# Patient Record
Sex: Female | Born: 1945
Health system: Southern US, Community
[De-identification: ages and names within clinical notes are randomized; demographics above are authoritative.]

## PROBLEM LIST (undated history)

## (undated) DIAGNOSIS — E785 Hyperlipidemia, unspecified: Secondary | ICD-10-CM

## (undated) DIAGNOSIS — Z800 Family history of malignant neoplasm of digestive organs: Secondary | ICD-10-CM

## (undated) DIAGNOSIS — N390 Urinary tract infection, site not specified: Secondary | ICD-10-CM

## (undated) DIAGNOSIS — M7521 Bicipital tendinitis, right shoulder: Secondary | ICD-10-CM

## (undated) DIAGNOSIS — K5790 Diverticulosis of intestine, part unspecified, without perforation or abscess without bleeding: Secondary | ICD-10-CM

## (undated) DIAGNOSIS — B019 Varicella without complication: Secondary | ICD-10-CM

## (undated) DIAGNOSIS — Z803 Family history of malignant neoplasm of breast: Secondary | ICD-10-CM

## (undated) DIAGNOSIS — F411 Generalized anxiety disorder: Secondary | ICD-10-CM

## (undated) DIAGNOSIS — A048 Other specified bacterial intestinal infections: Secondary | ICD-10-CM

## (undated) DIAGNOSIS — K635 Polyp of colon: Secondary | ICD-10-CM

## (undated) DIAGNOSIS — R933 Abnormal findings on diagnostic imaging of other parts of digestive tract: Secondary | ICD-10-CM

## (undated) DIAGNOSIS — T7840XA Allergy, unspecified, initial encounter: Secondary | ICD-10-CM

## (undated) DIAGNOSIS — K259 Gastric ulcer, unspecified as acute or chronic, without hemorrhage or perforation: Secondary | ICD-10-CM

## (undated) DIAGNOSIS — R198 Other specified symptoms and signs involving the digestive system and abdomen: Secondary | ICD-10-CM

## (undated) DIAGNOSIS — G5701 Lesion of sciatic nerve, right lower limb: Secondary | ICD-10-CM

## (undated) DIAGNOSIS — I351 Nonrheumatic aortic (valve) insufficiency: Secondary | ICD-10-CM

## (undated) DIAGNOSIS — I10 Essential (primary) hypertension: Secondary | ICD-10-CM

## (undated) DIAGNOSIS — Z8041 Family history of malignant neoplasm of ovary: Secondary | ICD-10-CM

## (undated) DIAGNOSIS — K219 Gastro-esophageal reflux disease without esophagitis: Secondary | ICD-10-CM

## (undated) HISTORY — DX: Polyp of colon: K63.5

## (undated) HISTORY — DX: Family history of malignant neoplasm of digestive organs: Z80.0

## (undated) HISTORY — DX: Diverticulosis of intestine, part unspecified, without perforation or abscess without bleeding: K57.90

## (undated) HISTORY — DX: Hyperlipidemia, unspecified: E78.5

## (undated) HISTORY — DX: Family history of malignant neoplasm of ovary: Z80.41

## (undated) HISTORY — PX: TUBAL LIGATION: SHX77

## (undated) HISTORY — DX: Allergy, unspecified, initial encounter: T78.40XA

## (undated) HISTORY — DX: Lesion of sciatic nerve, right lower limb: G57.01

## (undated) HISTORY — DX: Other specified symptoms and signs involving the digestive system and abdomen: R19.8

## (undated) HISTORY — DX: Gastro-esophageal reflux disease without esophagitis: K21.9

## (undated) HISTORY — DX: Family history of malignant neoplasm of breast: Z80.3

## (undated) HISTORY — DX: Gastric ulcer, unspecified as acute or chronic, without hemorrhage or perforation: K25.9

## (undated) HISTORY — DX: Generalized anxiety disorder: F41.1

## (undated) HISTORY — DX: Other specified bacterial intestinal infections: A04.8

## (undated) HISTORY — DX: Nonrheumatic aortic (valve) insufficiency: I35.1

## (undated) HISTORY — DX: Abnormal findings on diagnostic imaging of other parts of digestive tract: R93.3

## (undated) HISTORY — PX: LIPOMA EXCISION: SHX5283

## (undated) HISTORY — DX: Varicella without complication: B01.9

## (undated) HISTORY — DX: Essential (primary) hypertension: I10

## (undated) HISTORY — DX: Urinary tract infection, site not specified: N39.0

## (undated) HISTORY — DX: Bicipital tendinitis, right shoulder: M75.21

---

## 1997-07-23 ENCOUNTER — Other Ambulatory Visit: Admission: RE | Admit: 1997-07-23 | Discharge: 1997-07-23 | Payer: Self-pay | Admitting: Obstetrics and Gynecology

## 1997-08-20 ENCOUNTER — Ambulatory Visit (HOSPITAL_COMMUNITY): Admission: RE | Admit: 1997-08-20 | Discharge: 1997-08-20 | Payer: Self-pay | Admitting: Obstetrics and Gynecology

## 1998-08-01 ENCOUNTER — Other Ambulatory Visit: Admission: RE | Admit: 1998-08-01 | Discharge: 1998-08-01 | Payer: Self-pay | Admitting: Obstetrics and Gynecology

## 1998-08-29 ENCOUNTER — Encounter: Payer: Self-pay | Admitting: Obstetrics and Gynecology

## 1998-08-29 ENCOUNTER — Ambulatory Visit (HOSPITAL_COMMUNITY): Admission: RE | Admit: 1998-08-29 | Discharge: 1998-08-29 | Payer: Self-pay | Admitting: Obstetrics and Gynecology

## 1999-12-07 ENCOUNTER — Other Ambulatory Visit: Admission: RE | Admit: 1999-12-07 | Discharge: 1999-12-07 | Payer: Self-pay | Admitting: Obstetrics and Gynecology

## 1999-12-11 ENCOUNTER — Encounter: Payer: Self-pay | Admitting: Obstetrics and Gynecology

## 1999-12-11 ENCOUNTER — Ambulatory Visit (HOSPITAL_COMMUNITY): Admission: RE | Admit: 1999-12-11 | Discharge: 1999-12-11 | Payer: Self-pay | Admitting: Obstetrics and Gynecology

## 2000-12-08 ENCOUNTER — Other Ambulatory Visit: Admission: RE | Admit: 2000-12-08 | Discharge: 2000-12-08 | Payer: Self-pay | Admitting: Obstetrics and Gynecology

## 2000-12-29 ENCOUNTER — Encounter: Payer: Self-pay | Admitting: Obstetrics and Gynecology

## 2000-12-29 ENCOUNTER — Ambulatory Visit (HOSPITAL_COMMUNITY): Admission: RE | Admit: 2000-12-29 | Discharge: 2000-12-29 | Payer: Self-pay | Admitting: Obstetrics and Gynecology

## 2001-12-05 ENCOUNTER — Other Ambulatory Visit: Admission: RE | Admit: 2001-12-05 | Discharge: 2001-12-05 | Payer: Self-pay | Admitting: Obstetrics and Gynecology

## 2002-01-09 ENCOUNTER — Ambulatory Visit (HOSPITAL_COMMUNITY): Admission: RE | Admit: 2002-01-09 | Discharge: 2002-01-09 | Payer: Self-pay | Admitting: Obstetrics and Gynecology

## 2002-01-09 ENCOUNTER — Encounter: Payer: Self-pay | Admitting: Obstetrics and Gynecology

## 2002-08-03 ENCOUNTER — Encounter: Payer: Self-pay | Admitting: Internal Medicine

## 2002-08-06 ENCOUNTER — Encounter: Payer: Self-pay | Admitting: Internal Medicine

## 2002-09-05 ENCOUNTER — Encounter: Payer: Self-pay | Admitting: Internal Medicine

## 2002-12-24 ENCOUNTER — Other Ambulatory Visit: Admission: RE | Admit: 2002-12-24 | Discharge: 2002-12-24 | Payer: Self-pay | Admitting: Obstetrics and Gynecology

## 2003-01-15 ENCOUNTER — Encounter: Payer: Self-pay | Admitting: Obstetrics and Gynecology

## 2003-01-15 ENCOUNTER — Ambulatory Visit (HOSPITAL_COMMUNITY): Admission: RE | Admit: 2003-01-15 | Discharge: 2003-01-15 | Payer: Self-pay | Admitting: Obstetrics and Gynecology

## 2003-12-31 ENCOUNTER — Other Ambulatory Visit: Admission: RE | Admit: 2003-12-31 | Discharge: 2003-12-31 | Payer: Self-pay | Admitting: *Deleted

## 2004-01-21 ENCOUNTER — Ambulatory Visit (HOSPITAL_COMMUNITY): Admission: RE | Admit: 2004-01-21 | Discharge: 2004-01-21 | Payer: Self-pay | Admitting: *Deleted

## 2004-03-19 ENCOUNTER — Ambulatory Visit: Payer: Self-pay | Admitting: Internal Medicine

## 2004-07-28 ENCOUNTER — Ambulatory Visit: Payer: Self-pay | Admitting: Internal Medicine

## 2004-07-31 ENCOUNTER — Ambulatory Visit: Payer: Self-pay | Admitting: Internal Medicine

## 2004-09-10 ENCOUNTER — Ambulatory Visit: Payer: Self-pay | Admitting: Internal Medicine

## 2004-09-15 ENCOUNTER — Ambulatory Visit: Payer: Self-pay | Admitting: Internal Medicine

## 2005-01-27 ENCOUNTER — Other Ambulatory Visit: Admission: RE | Admit: 2005-01-27 | Discharge: 2005-01-27 | Payer: Self-pay | Admitting: Obstetrics and Gynecology

## 2005-02-24 ENCOUNTER — Ambulatory Visit (HOSPITAL_COMMUNITY): Admission: RE | Admit: 2005-02-24 | Discharge: 2005-02-24 | Payer: Self-pay | Admitting: Obstetrics and Gynecology

## 2005-03-17 ENCOUNTER — Ambulatory Visit: Payer: Self-pay | Admitting: Internal Medicine

## 2005-08-03 ENCOUNTER — Ambulatory Visit: Payer: Self-pay | Admitting: Internal Medicine

## 2006-02-08 ENCOUNTER — Ambulatory Visit: Payer: Self-pay | Admitting: Psychology

## 2006-02-28 ENCOUNTER — Ambulatory Visit (HOSPITAL_COMMUNITY): Admission: RE | Admit: 2006-02-28 | Discharge: 2006-02-28 | Payer: Self-pay | Admitting: Obstetrics and Gynecology

## 2006-03-01 ENCOUNTER — Ambulatory Visit: Payer: Self-pay | Admitting: Psychology

## 2006-03-29 DIAGNOSIS — K259 Gastric ulcer, unspecified as acute or chronic, without hemorrhage or perforation: Secondary | ICD-10-CM

## 2006-03-29 HISTORY — DX: Gastric ulcer, unspecified as acute or chronic, without hemorrhage or perforation: K25.9

## 2006-03-30 ENCOUNTER — Ambulatory Visit: Payer: Self-pay | Admitting: Psychology

## 2006-04-05 ENCOUNTER — Ambulatory Visit: Payer: Self-pay | Admitting: Psychology

## 2006-04-20 ENCOUNTER — Ambulatory Visit: Payer: Self-pay | Admitting: Psychology

## 2006-05-11 ENCOUNTER — Ambulatory Visit: Payer: Self-pay | Admitting: Psychology

## 2006-05-12 ENCOUNTER — Ambulatory Visit: Payer: Self-pay | Admitting: Internal Medicine

## 2006-05-12 ENCOUNTER — Encounter: Admission: RE | Admit: 2006-05-12 | Discharge: 2006-05-12 | Payer: Self-pay | Admitting: Internal Medicine

## 2006-05-12 LAB — CONVERTED CEMR LAB
ALT: 27 units/L (ref 0–40)
AST: 32 units/L (ref 0–37)
Albumin: 4.4 g/dL (ref 3.5–5.2)
Alkaline Phosphatase: 67 units/L (ref 39–117)
Basophils Absolute: 0 10*3/uL (ref 0.0–0.1)
Basophils Relative: 0.6 % (ref 0.0–1.0)
Bilirubin, Direct: 0.1 mg/dL (ref 0.0–0.3)
Eosinophils Absolute: 0.1 10*3/uL (ref 0.0–0.6)
Eosinophils Relative: 1.8 % (ref 0.0–5.0)
H Pylori IgG: POSITIVE — AB
HCT: 45.3 % (ref 36.0–46.0)
Hemoglobin: 15.6 g/dL — ABNORMAL HIGH (ref 12.0–15.0)
Lymphocytes Relative: 31.8 % (ref 12.0–46.0)
MCHC: 34.4 g/dL (ref 30.0–36.0)
MCV: 86.5 fL (ref 78.0–100.0)
Monocytes Absolute: 0.5 10*3/uL (ref 0.2–0.7)
Monocytes Relative: 9.6 % (ref 3.0–11.0)
Neutro Abs: 3.2 10*3/uL (ref 1.4–7.7)
Neutrophils Relative %: 56.2 % (ref 43.0–77.0)
Platelets: 223 10*3/uL (ref 150–400)
RBC: 5.23 M/uL — ABNORMAL HIGH (ref 3.87–5.11)
RDW: 12.5 % (ref 11.5–14.6)
Total Bilirubin: 0.8 mg/dL (ref 0.3–1.2)
Total Protein: 6.8 g/dL (ref 6.0–8.3)
WBC: 5.6 10*3/uL (ref 4.5–10.5)

## 2006-08-30 ENCOUNTER — Ambulatory Visit: Payer: Self-pay | Admitting: Internal Medicine

## 2006-09-07 ENCOUNTER — Ambulatory Visit: Payer: Self-pay | Admitting: Internal Medicine

## 2006-09-27 ENCOUNTER — Encounter: Payer: Self-pay | Admitting: Internal Medicine

## 2006-09-27 ENCOUNTER — Ambulatory Visit: Payer: Self-pay | Admitting: Internal Medicine

## 2006-11-09 DIAGNOSIS — K219 Gastro-esophageal reflux disease without esophagitis: Secondary | ICD-10-CM | POA: Insufficient documentation

## 2006-11-09 DIAGNOSIS — E785 Hyperlipidemia, unspecified: Secondary | ICD-10-CM | POA: Insufficient documentation

## 2006-11-09 HISTORY — DX: Hyperlipidemia, unspecified: E78.5

## 2006-11-09 HISTORY — DX: Gastro-esophageal reflux disease without esophagitis: K21.9

## 2006-11-16 ENCOUNTER — Telehealth: Payer: Self-pay | Admitting: Internal Medicine

## 2006-11-22 ENCOUNTER — Telehealth: Payer: Self-pay | Admitting: Internal Medicine

## 2006-11-23 ENCOUNTER — Telehealth (INDEPENDENT_AMBULATORY_CARE_PROVIDER_SITE_OTHER): Payer: Self-pay | Admitting: *Deleted

## 2007-02-02 ENCOUNTER — Telehealth: Payer: Self-pay | Admitting: Internal Medicine

## 2007-02-06 ENCOUNTER — Telehealth: Payer: Self-pay | Admitting: Internal Medicine

## 2007-03-02 ENCOUNTER — Ambulatory Visit (HOSPITAL_COMMUNITY): Admission: RE | Admit: 2007-03-02 | Discharge: 2007-03-02 | Payer: Self-pay | Admitting: Obstetrics and Gynecology

## 2007-04-17 ENCOUNTER — Ambulatory Visit: Payer: Self-pay | Admitting: Psychology

## 2007-04-28 ENCOUNTER — Ambulatory Visit: Payer: Self-pay | Admitting: Internal Medicine

## 2007-04-28 LAB — CONVERTED CEMR LAB
AST: 31 units/L (ref 0–37)
BUN: 12 mg/dL (ref 6–23)
Basophils Absolute: 0 10*3/uL (ref 0.0–0.1)
Basophils Relative: 0.5 % (ref 0.0–1.0)
Bilirubin, Direct: 0.2 mg/dL (ref 0.0–0.3)
Calcium: 9.8 mg/dL (ref 8.4–10.5)
Creatinine, Ser: 0.7 mg/dL (ref 0.4–1.2)
Direct LDL: 126.7 mg/dL
GFR calc Af Amer: 110 mL/min
GFR calc non Af Amer: 91 mL/min
Glucose, Urine, Semiquant: 100
Hemoglobin: 16 g/dL — ABNORMAL HIGH (ref 12.0–15.0)
MCHC: 32.4 g/dL (ref 30.0–36.0)
Monocytes Absolute: 0.5 10*3/uL (ref 0.2–0.7)
Monocytes Relative: 7.8 % (ref 3.0–11.0)
Neutro Abs: 3.5 10*3/uL (ref 1.4–7.7)
Platelets: 252 10*3/uL (ref 150–400)
Potassium: 4.2 meq/L (ref 3.5–5.1)
Protein, U semiquant: NEGATIVE
RDW: 12.1 % (ref 11.5–14.6)
Specific Gravity, Urine: 1.02
Total CHOL/HDL Ratio: 2.8
Total Protein: 7 g/dL (ref 6.0–8.3)
Triglycerides: 83 mg/dL (ref 0–149)
VLDL: 17 mg/dL (ref 0–40)

## 2007-05-05 ENCOUNTER — Ambulatory Visit: Payer: Self-pay | Admitting: Psychology

## 2007-05-25 ENCOUNTER — Ambulatory Visit: Payer: Self-pay | Admitting: Internal Medicine

## 2007-06-09 ENCOUNTER — Ambulatory Visit: Payer: Self-pay | Admitting: Internal Medicine

## 2007-06-26 ENCOUNTER — Ambulatory Visit: Payer: Self-pay | Admitting: Psychology

## 2007-07-06 ENCOUNTER — Ambulatory Visit: Payer: Self-pay | Admitting: Internal Medicine

## 2007-07-06 DIAGNOSIS — F411 Generalized anxiety disorder: Secondary | ICD-10-CM

## 2007-07-06 HISTORY — DX: Generalized anxiety disorder: F41.1

## 2007-07-18 ENCOUNTER — Ambulatory Visit: Payer: Self-pay | Admitting: Psychology

## 2007-08-14 ENCOUNTER — Ambulatory Visit: Payer: Self-pay | Admitting: Internal Medicine

## 2007-08-28 ENCOUNTER — Ambulatory Visit: Payer: Self-pay | Admitting: Internal Medicine

## 2007-08-28 LAB — HM COLONOSCOPY: HM Colonoscopy: NORMAL

## 2007-10-04 ENCOUNTER — Encounter: Payer: Self-pay | Admitting: Internal Medicine

## 2007-10-24 ENCOUNTER — Ambulatory Visit: Payer: Self-pay

## 2007-10-24 ENCOUNTER — Encounter: Payer: Self-pay | Admitting: Internal Medicine

## 2008-01-04 ENCOUNTER — Ambulatory Visit: Payer: Self-pay | Admitting: Internal Medicine

## 2008-01-23 ENCOUNTER — Encounter: Payer: Self-pay | Admitting: Internal Medicine

## 2008-03-04 ENCOUNTER — Ambulatory Visit (HOSPITAL_COMMUNITY): Admission: RE | Admit: 2008-03-04 | Discharge: 2008-03-04 | Payer: Self-pay | Admitting: Obstetrics and Gynecology

## 2008-08-27 LAB — CONVERTED CEMR LAB: Pap Smear: NORMAL

## 2008-10-24 ENCOUNTER — Encounter: Payer: Self-pay | Admitting: Internal Medicine

## 2008-11-12 ENCOUNTER — Encounter: Payer: Self-pay | Admitting: Internal Medicine

## 2008-11-12 ENCOUNTER — Encounter (INDEPENDENT_AMBULATORY_CARE_PROVIDER_SITE_OTHER): Payer: Self-pay | Admitting: General Surgery

## 2008-11-12 ENCOUNTER — Ambulatory Visit (HOSPITAL_BASED_OUTPATIENT_CLINIC_OR_DEPARTMENT_OTHER): Admission: RE | Admit: 2008-11-12 | Discharge: 2008-11-12 | Payer: Self-pay | Admitting: General Surgery

## 2008-12-05 ENCOUNTER — Encounter: Payer: Self-pay | Admitting: Internal Medicine

## 2008-12-10 ENCOUNTER — Telehealth (INDEPENDENT_AMBULATORY_CARE_PROVIDER_SITE_OTHER): Payer: Self-pay | Admitting: *Deleted

## 2008-12-10 ENCOUNTER — Ambulatory Visit: Payer: Self-pay | Admitting: Internal Medicine

## 2008-12-10 DIAGNOSIS — S61209A Unspecified open wound of unspecified finger without damage to nail, initial encounter: Secondary | ICD-10-CM | POA: Insufficient documentation

## 2008-12-12 ENCOUNTER — Ambulatory Visit: Payer: Self-pay | Admitting: Internal Medicine

## 2008-12-24 ENCOUNTER — Ambulatory Visit: Payer: Self-pay | Admitting: Internal Medicine

## 2008-12-24 LAB — CONVERTED CEMR LAB
Alkaline Phosphatase: 68 units/L (ref 39–117)
BUN: 12 mg/dL (ref 6–23)
Basophils Absolute: 0 10*3/uL (ref 0.0–0.1)
Basophils Relative: 0.6 % (ref 0.0–3.0)
Bilirubin, Direct: 0.1 mg/dL (ref 0.0–0.3)
CO2: 32 meq/L (ref 19–32)
Chloride: 108 meq/L (ref 96–112)
Cholesterol: 199 mg/dL (ref 0–200)
Creatinine, Ser: 0.6 mg/dL (ref 0.4–1.2)
Eosinophils Absolute: 0.2 10*3/uL (ref 0.0–0.7)
Glucose, Bld: 76 mg/dL (ref 70–99)
Hemoglobin: 15.4 g/dL — ABNORMAL HIGH (ref 12.0–15.0)
LDL Cholesterol: 118 mg/dL — ABNORMAL HIGH (ref 0–99)
Leukocytes, UA: NEGATIVE
Lymphocytes Relative: 34.7 % (ref 12.0–46.0)
MCHC: 33.9 g/dL (ref 30.0–36.0)
Neutrophils Relative %: 53.7 % (ref 43.0–77.0)
Nitrite: NEGATIVE
Platelets: 209 10*3/uL (ref 150.0–400.0)
Potassium: 4.3 meq/L (ref 3.5–5.1)
RBC: 5.17 M/uL — ABNORMAL HIGH (ref 3.87–5.11)
Specific Gravity, Urine: <=1.005 (ref 1.000–1.030)
Total Bilirubin: 0.9 mg/dL (ref 0.3–1.2)
Total CHOL/HDL Ratio: 3
Total Protein: 7.1 g/dL (ref 6.0–8.3)
Urobilinogen, UA: 0.2 (ref 0.0–1.0)
VLDL: 16.6 mg/dL (ref 0.0–40.0)
pH: 6.5 (ref 5.0–8.0)

## 2008-12-27 LAB — HM MAMMOGRAPHY: HM Mammogram: NORMAL

## 2008-12-31 ENCOUNTER — Ambulatory Visit: Payer: Self-pay | Admitting: Internal Medicine

## 2009-01-07 ENCOUNTER — Encounter: Payer: Self-pay | Admitting: Internal Medicine

## 2009-01-07 ENCOUNTER — Ambulatory Visit: Payer: Self-pay | Admitting: Internal Medicine

## 2009-03-11 ENCOUNTER — Ambulatory Visit (HOSPITAL_COMMUNITY): Admission: RE | Admit: 2009-03-11 | Discharge: 2009-03-11 | Payer: Self-pay | Admitting: Obstetrics and Gynecology

## 2010-01-14 ENCOUNTER — Ambulatory Visit: Payer: Self-pay | Admitting: Internal Medicine

## 2010-03-16 ENCOUNTER — Ambulatory Visit (HOSPITAL_COMMUNITY)
Admission: RE | Admit: 2010-03-16 | Discharge: 2010-03-16 | Payer: Self-pay | Source: Home / Self Care | Attending: Obstetrics and Gynecology | Admitting: Obstetrics and Gynecology

## 2010-04-28 NOTE — Assessment & Plan Note (Signed)
Summary: FLU SHOT // RS   Nurse Visit   Allergies: 1)  Tetracycline Hcl (Tetracycline Hcl)  Orders Added: 1)  Admin 1st Vaccine [90471] 2)  Flu Vaccine 7yrs + [16109] Flu Vaccine Consent Questions     Do you have a history of severe allergic reactions to this vaccine? no    Any prior history of allergic reactions to egg and/or gelatin? no    Do you have a sensitivity to the preservative Thimersol? no    Do you have a past history of Guillan-Barre Syndrome? no    Do you currently have an acute febrile illness? no    Have you ever had a severe reaction to latex? no    Vaccine information given and explained to patient? yes    Are you currently pregnant? no    Lot Number:[id number]   Exp Date:09/26/2010   Site Given  Left Deltoid IM

## 2010-07-04 LAB — POCT HEMOGLOBIN-HEMACUE: Hemoglobin: 16.9 g/dL — ABNORMAL HIGH (ref 12.0–15.0)

## 2010-07-28 ENCOUNTER — Ambulatory Visit (INDEPENDENT_AMBULATORY_CARE_PROVIDER_SITE_OTHER): Payer: BC Managed Care – PPO | Admitting: Family Medicine

## 2010-07-28 ENCOUNTER — Encounter: Payer: Self-pay | Admitting: Family Medicine

## 2010-07-28 VITALS — BP 120/78 | Temp 98.4°F | Wt 124.0 lb

## 2010-07-28 DIAGNOSIS — M546 Pain in thoracic spine: Secondary | ICD-10-CM

## 2010-07-28 NOTE — Patient Instructions (Signed)
Follow up if you note any progressive pain or new symptoms.

## 2010-07-28 NOTE — Progress Notes (Signed)
  Subjective:    Patient ID: Yvette Short, female    DOB: 1945-07-26, 65 y.o.   MRN: [id number]  HPI Patient fell approximately 2 weeks ago. Tripped on asphalt. Landed on left side. Initially had some left lateral hip pain which has resolved and some bruising in that region. Mild left wrist pain also resolving. Has some pain left of the midthoracic spine about 4/10 severity but overall this is improving. No cough and no dyspnea. No pleuritic pain. Pain slightly worse lying. Minimal left anterior chest wall pain was also resolving. No head injury. Good relief with Tylenol and Advil.  Ambulating without difficulty.  Past Medical History  Diagnosis Date  . HYPERLIPIDEMIA 11/09/2006  . ANXIETY 07/06/2007  . GERD 11/09/2006   History reviewed. No pertinent past surgical history.  reports that she quit smoking about 36 years ago. Her smoking use included Cigarettes. She has a 325 pack-year smoking history. She does not have any smokeless tobacco history on file. Her alcohol and drug histories not on file. family history includes Cancer (age of onset:58) in her father and Dementia in her mother. Allergies  Allergen Reactions  . Tetracycline Hcl     REACTION: rash      Review of Systems  Respiratory: Negative for chest tightness and shortness of breath.   Cardiovascular: Negative for chest pain.  Gastrointestinal: Negative for abdominal pain and abdominal distention.  Musculoskeletal: Positive for back pain. Negative for myalgias and joint swelling.  Neurological: Negative for dizziness, syncope and headaches.       Objective:   Physical Exam  Constitutional: She appears well-developed and well-nourished. No distress.  HENT:  Head: Normocephalic and atraumatic.  Right Ear: External ear normal.  Left Ear: External ear normal.  Neck: Neck supple.  Cardiovascular: Normal rate, regular rhythm and normal heart sounds.  Exam reveals no gallop.   No murmur heard. Pulmonary/Chest: Effort  normal and breath sounds normal. No respiratory distress. She has no wheezes. She has no rales. She exhibits no tenderness.  Musculoskeletal:       Patient has no thoracic spine tenderness. Minimal muscular tenderness left of mid thoracic spine. No bruising. Left wrist exam is normal. Ambulating without difficulty.          Assessment & Plan:  Mid back pain following fall. Suspect soft tissue muscular pain. Overall this is improving. No spinal tenderness. Continue anti-inflammatory as needed and follow for now.

## 2010-08-04 ENCOUNTER — Telehealth: Payer: Self-pay | Admitting: *Deleted

## 2010-08-04 MED ORDER — LISINOPRIL 10 MG PO TABS: 10.0000 mg | ORAL_TABLET | Freq: Every day | ORAL | Status: DC

## 2010-08-04 NOTE — Telephone Encounter (Signed)
LMTCB

## 2010-08-04 NOTE — Telephone Encounter (Signed)
Review curent med list with patient and update epic If not on any bp meds start lisinopril 10 mg po qd

## 2010-08-04 NOTE — Telephone Encounter (Signed)
Pt was scheduled for an outpatient procedure today, and it was postponed due to her elevated BP.  First reading was 174/92, and was given .1mg  of Clonidine, and it eventually came down to 151/101 and later 182/86.  Was told to call Dr. Cato Mulligan for further instructions regarding her BP.

## 2010-08-04 NOTE — Telephone Encounter (Signed)
Pt is not on any BP meds and will start the Lisinopril.  Will check BP readings, and call them to [place] in a week or two.

## 2010-08-11 NOTE — Op Note (Signed)
Yvette, Short           ACCOUNT NO.:  [id number]   MEDICAL RECORD NO.:  [id number]          PATIENT TYPE:  AMB   LOCATION:  [facility]                          FACILITY:  [facility]   PHYSICIAN:  Sharlet Salina T. Hoxworth, M.D.DATE OF BIRTH:  Nov 14, 1945   DATE OF PROCEDURE:  11/12/2008  DATE OF DISCHARGE:                               OPERATIVE REPORT   PREOPERATIVE DIAGNOSIS:  Lipoma of back.   POSTOPERATIVE DIAGNOSIS:  Lipoma of back.   SURGICAL PROCEDURES:  Excision of lipoma of back.   SURGEON:  Lorne Skeens. Hoxworth, MD   ANESTHESIA:  Local with IV sedation.   BRIEF HISTORY:  [name] is a 65 year old female with a gradually  enlarging mass in her mid back.  Examination reveals an approximately 7  x 5 cm soft, freely movable subcutaneous mass consistent with lipoma.  Due to enlargement and symptoms, I have recommended excision.  Nature of  procedure, type of anesthetic, indications, risks of bleeding,  infection, and seroma were discussed and understood.  She is now brought  to operating room for this procedure.   DESCRIPTION OF OPERATION:  The patient was brought to operating room,  carefully positioned in the right lateral decubitus position and the  back widely sterilely prepped and draped.  She received preoperative  antibiotics.  Correct patient and procedure were verified.  A transverse  incision was made directly over the mass somewhat smaller than its size.  Dissection was carried down through the subcutaneous tissue.  The mass  was encapsulated and easily dissected away using mostly blunt dissection  from subcu and off the fascia.  Some sharp dissection was used as well  with local anesthesia and mass was completely excised and isolated down  to small vascular pedicle which was clamped, divided, and tied with 3-0  Vicryl.  Complete hemostasis was assured.  The subcu was closed with  interrupted 4-0 Monocryl and the skin with subcuticular 4-0 Monocryl and  Dermabond.  Sponges, needle, and instrument counts were correct.  The  patient was taken to the recovery in good condition.      Lorne Skeens. Hoxworth, M.D.  Electronically Signed     BTH/MEDQ  D:  11/12/2008  T:  11/13/2008  Job:  [id number]

## 2010-10-02 ENCOUNTER — Other Ambulatory Visit: Payer: Self-pay | Admitting: Internal Medicine

## 2010-11-05 ENCOUNTER — Other Ambulatory Visit: Payer: Self-pay | Admitting: Internal Medicine

## 2010-11-07 ENCOUNTER — Other Ambulatory Visit: Payer: Self-pay | Admitting: Internal Medicine

## 2010-11-11 ENCOUNTER — Telehealth: Payer: Self-pay | Admitting: *Deleted

## 2010-11-11 NOTE — Telephone Encounter (Signed)
Pt calls stating her BP (diastolic) usually runs in the 16X and 60's, but at two other MD appts, it was 82 and 88.  Having an elective procedure in 2 weeks and is wondering if Dr. Cato Mulligan thinks she should change any meds.

## 2010-11-11 NOTE — Telephone Encounter (Signed)
Ok to stay on same meds

## 2010-11-11 NOTE — Telephone Encounter (Signed)
Notified pt. 

## 2010-11-17 ENCOUNTER — Telehealth: Payer: Self-pay | Admitting: *Deleted

## 2010-11-17 MED ORDER — CLONIDINE HCL 0.1 MG PO TABS: ORAL_TABLET | ORAL | Status: DC

## 2010-11-17 NOTE — Telephone Encounter (Signed)
Pt walked in [facility]. [facility] was 188/90/in rt arm and 182/88/ was given clonidine and dropped to 185/95 in rt arm a nd 168/89 in left arm- when taken here at office it was 150 /80-- takes lisinopril- wants to have procedure done ,but every time she goes for appointment , bp is elevated/please advise what to do

## 2010-11-17 NOTE — Telephone Encounter (Signed)
Monitor bp at home and make sure normal bp is less than 140/90. Prior to next procedure have her take clonidine 0.1 mg by mouth 1 hour before procedure

## 2010-11-17 NOTE — Telephone Encounter (Signed)
Pt informed and was rather reluctant to do this. States she feels like she is trying to change her bp.  Will call 3# of clonidine in case she drops 1 or 2.

## 2010-12-07 ENCOUNTER — Other Ambulatory Visit: Payer: Self-pay | Admitting: Internal Medicine

## 2010-12-09 ENCOUNTER — Other Ambulatory Visit: Payer: Self-pay | Admitting: Internal Medicine

## 2010-12-09 NOTE — Telephone Encounter (Signed)
Pt has only one pill left

## 2010-12-10 ENCOUNTER — Other Ambulatory Visit: Payer: Self-pay | Admitting: *Deleted

## 2010-12-10 ENCOUNTER — Other Ambulatory Visit (INDEPENDENT_AMBULATORY_CARE_PROVIDER_SITE_OTHER): Payer: BC Managed Care – PPO

## 2010-12-10 DIAGNOSIS — Z0000 Encounter for general adult medical examination without abnormal findings: Secondary | ICD-10-CM

## 2010-12-10 LAB — CBC WITH DIFFERENTIAL/PLATELET
Basophils Relative: 0.5 % (ref 0.0–3.0)
Eosinophils Absolute: 0.4 10*3/uL (ref 0.0–0.7)
Eosinophils Relative: 7.8 % — ABNORMAL HIGH (ref 0.0–5.0)
HCT: 44.1 % (ref 36.0–46.0)
Hemoglobin: 14.6 g/dL (ref 12.0–15.0)
Lymphs Abs: 2.3 10*3/uL (ref 0.7–4.0)
MCHC: 33.1 g/dL (ref 30.0–36.0)
Monocytes Absolute: 0.4 10*3/uL (ref 0.1–1.0)
Neutrophils Relative %: 42.6 % — ABNORMAL LOW (ref 43.0–77.0)
Platelets: 222 10*3/uL (ref 150.0–400.0)
RBC: 4.91 Mil/uL (ref 3.87–5.11)
RDW: 13 % (ref 11.5–14.6)

## 2010-12-10 LAB — LIPID PANEL
Cholesterol: 225 mg/dL — ABNORMAL HIGH (ref 0–200)
Triglycerides: 105 mg/dL (ref 0.0–149.0)

## 2010-12-10 LAB — POCT URINALYSIS DIPSTICK
Bilirubin, UA: NEGATIVE
Blood, UA: NEGATIVE
Glucose, UA: NEGATIVE
Ketones, UA: NEGATIVE
Nitrite, UA: NEGATIVE
Spec Grav, UA: 1.02
Urobilinogen, UA: 0.2
pH, UA: 6

## 2010-12-10 LAB — BASIC METABOLIC PANEL
BUN: 16 mg/dL (ref 6–23)
Creatinine, Ser: 0.6 mg/dL (ref 0.4–1.2)
GFR: 101.08 mL/min (ref >60.00)
Potassium: 4.9 mEq/L (ref 3.5–5.1)
Sodium: 142 mEq/L (ref 135–145)

## 2010-12-10 LAB — HEPATIC FUNCTION PANEL
ALT: 34 U/L (ref 0–35)
AST: 33 U/L (ref 0–37)
Bilirubin, Direct: 0 mg/dL (ref 0.0–0.3)
Total Protein: 7.1 g/dL (ref 6.0–8.3)

## 2010-12-10 LAB — TSH: TSH: 2.26 u[IU]/mL (ref 0.35–5.50)

## 2010-12-10 MED ORDER — LISINOPRIL 10 MG PO TABS: 10.0000 mg | ORAL_TABLET | Freq: Every day | ORAL | Status: DC

## 2010-12-17 ENCOUNTER — Encounter: Payer: BC Managed Care – PPO | Admitting: Internal Medicine

## 2010-12-21 ENCOUNTER — Encounter: Payer: Self-pay | Admitting: Internal Medicine

## 2010-12-21 ENCOUNTER — Ambulatory Visit (INDEPENDENT_AMBULATORY_CARE_PROVIDER_SITE_OTHER): Payer: BC Managed Care – PPO | Admitting: Internal Medicine

## 2010-12-21 ENCOUNTER — Telehealth: Payer: Self-pay | Admitting: *Deleted

## 2010-12-21 DIAGNOSIS — G43909 Migraine, unspecified, not intractable, without status migrainosus: Secondary | ICD-10-CM

## 2010-12-21 DIAGNOSIS — I10 Essential (primary) hypertension: Secondary | ICD-10-CM

## 2010-12-21 DIAGNOSIS — Z23 Encounter for immunization: Secondary | ICD-10-CM

## 2010-12-21 DIAGNOSIS — K219 Gastro-esophageal reflux disease without esophagitis: Secondary | ICD-10-CM

## 2010-12-21 HISTORY — DX: Essential (primary) hypertension: I10

## 2010-12-21 MED ORDER — SUMATRIPTAN SUCCINATE 100 MG PO TABS: ORAL_TABLET | ORAL | Status: DC

## 2010-12-21 MED ORDER — OMEPRAZOLE 20 MG PO CPDR: 20.0000 mg | DELAYED_RELEASE_CAPSULE | Freq: Every day | ORAL | Status: DC

## 2010-12-21 MED ORDER — AMLODIPINE BESYLATE 2.5 MG PO TABS: 2.5000 mg | ORAL_TABLET | Freq: Every day | ORAL | Status: DC

## 2010-12-21 NOTE — Assessment & Plan Note (Signed)
She is not on PPI Reviewed EGD--note ? [facility]  Resume PPI

## 2010-12-21 NOTE — Progress Notes (Signed)
  Subjective:    Patient ID: Yvette Short, female    DOB: 07/31/1945, 65 y.o.   MRN: [id number]  HPI  cpx Has developed chronic cough (note lisinopril use)  Chronic abdominal pain---ongoing for years.   Long hx of migraines: approximately one/month  Past Medical History  Diagnosis Date  . HYPERLIPIDEMIA 11/09/2006  . ANXIETY 07/06/2007  . GERD 11/09/2006   No past surgical history on file.  reports that she quit smoking about 36 years ago. Her smoking use included Cigarettes. She has a 325 pack-year smoking history. She does not have any smokeless tobacco history on file. Her alcohol and drug histories not on file. family history includes Cancer (age of onset:58) in her father and Dementia in her mother. Allergies  Allergen Reactions  . Tetracycline Hcl     REACTION: rash     Review of Systems  patient denies chest pain, shortness of breath, orthopnea. Denies lower extremity edema, abdominal pain, change in appetite, change in bowel movements. Patient denies rashes, musculoskeletal complaints. No other specific complaints in a complete review of systems.      Objective:   Physical Exam  Well-developed well-nourished female in no acute distress. HEENT exam atraumatic, normocephalic, extraocular muscles are intact. Neck is supple. No jugular venous distention no thyromegaly. Chest clear to auscultation without increased work of breathing. Cardiac exam S1 and S2 are regular. Abdominal exam active bowel sounds, soft, nontender. Extremities no edema. Neurologic exam she is alert without any motor sensory deficits. Gait is normal.     Assessment & Plan:  Well visit, health maint UTD

## 2010-12-21 NOTE — Assessment & Plan Note (Signed)
Trial imitrex.

## 2010-12-21 NOTE — Telephone Encounter (Signed)
Pt states Dr. Cato Mulligan offered to give her Rx for anxiety for upcoming procedure?, and she would like to have that.  Wants to know if she should take the Clonidine with this???

## 2010-12-21 NOTE — Assessment & Plan Note (Addendum)
May have cough from lisinopril---stop meds Start norvasc---low dose Monitor bp at home, call for bp > 135/85

## 2010-12-22 MED ORDER — LORAZEPAM 0.5 MG PO TABS: 0.5000 mg | ORAL_TABLET | ORAL | Status: DC

## 2010-12-22 NOTE — Telephone Encounter (Signed)
Probably reasonable to take both Lorazepam 0.5 mg po 1 hour prior to procedure. She should contact her surgeon to make sure he/she knows about the medication and approves.   #4 tabs/0refills

## 2010-12-22 NOTE — Telephone Encounter (Signed)
Notified pt of Dr. Cato Mulligan recommendations.

## 2011-01-05 ENCOUNTER — Telehealth: Payer: Self-pay | Admitting: *Deleted

## 2011-01-05 DIAGNOSIS — I10 Essential (primary) hypertension: Secondary | ICD-10-CM

## 2011-01-05 MED ORDER — AMLODIPINE BESYLATE 5 MG PO TABS: 5.0000 mg | ORAL_TABLET | Freq: Every day | ORAL | Status: DC

## 2011-01-05 MED ORDER — AMLODIPINE BESYLATE 2.5 MG PO TABS: 5.0000 mg | ORAL_TABLET | Freq: Every day | ORAL | Status: DC

## 2011-01-05 NOTE — Telephone Encounter (Signed)
Increase amlodipine 5 mg po qd

## 2011-01-05 NOTE — Telephone Encounter (Signed)
BP was still too high to get her Blepharoplasty !78/82/ 172/94.  She took her Amlodipine 2.5 mg that morning and also Clonidine 0.1 mg.  Feels her BP is running too high all the time....Marland Kitchenaveraging 117/67---141/85 at home.

## 2011-01-05 NOTE — Telephone Encounter (Signed)
Notified pt. 

## 2011-02-11 ENCOUNTER — Other Ambulatory Visit (HOSPITAL_COMMUNITY): Payer: Self-pay | Admitting: Obstetrics and Gynecology

## 2011-02-11 DIAGNOSIS — Z1231 Encounter for screening mammogram for malignant neoplasm of breast: Secondary | ICD-10-CM

## 2011-02-22 ENCOUNTER — Ambulatory Visit (INDEPENDENT_AMBULATORY_CARE_PROVIDER_SITE_OTHER): Payer: BC Managed Care – PPO | Admitting: Internal Medicine

## 2011-02-22 ENCOUNTER — Other Ambulatory Visit: Payer: Self-pay | Admitting: Internal Medicine

## 2011-02-22 ENCOUNTER — Encounter: Payer: Self-pay | Admitting: Internal Medicine

## 2011-02-22 VITALS — BP 112/84 | HR 72 | Temp 97.8°F | Wt 126.0 lb

## 2011-02-22 DIAGNOSIS — R0789 Other chest pain: Secondary | ICD-10-CM

## 2011-02-22 DIAGNOSIS — I10 Essential (primary) hypertension: Secondary | ICD-10-CM

## 2011-02-22 DIAGNOSIS — R9389 Abnormal findings on diagnostic imaging of other specified body structures: Secondary | ICD-10-CM

## 2011-02-22 NOTE — Progress Notes (Signed)
Patient ID: Yvette Short, female   DOB: [birth year], 65 y.o.   MRN: [id number]  htn--home bps average 135ish/70s (low bp of 107/67).  She does describe a chronic chest tightness. She previously related to GERD type sxs which are MUCH better on omeprzole.  She does admit to intermittent SOB---walking up a flight of steps.   Past Medical History  Diagnosis Date  . HYPERLIPIDEMIA 11/09/2006  . ANXIETY 07/06/2007  . GERD 11/09/2006    History   Social History  . Marital Status: Married    Spouse Name: N/A    Number of Children: N/A  . Years of Education: N/A   Occupational History  . Not on file.   Social History Main Topics  . Smoking status: Former Smoker -- 2.5 packs/day for 130 years    Types: Cigarettes    Quit date: 07/28/1974  . Smokeless tobacco: Not on file  . Alcohol Use: Not on file  . Drug Use: Not on file  . Sexually Active: Not on file   Other Topics Concern  . Not on file   Social History Narrative  . No narrative on file    No past surgical history on file.  Family History  Problem Relation Age of Onset  . Dementia Mother   . Cancer Father 45    colon    Allergies  Allergen Reactions  . Tetracycline Hcl     REACTION: rash    Current Outpatient Prescriptions on File Prior to Visit  Medication Sig Dispense Refill  . amLODipine (NORVASC) 5 MG tablet Take 1 tablet (5 mg total) by mouth daily.  30 tablet  11  . Cholecalciferol (VITAMIN D) 2000 UNITS CAPS Take 1 capsule by mouth daily.        Marland Kitchen estradiol (VIVELLE-DOT) 0.025 MG/24HR Place 1 patch onto the skin 2 (two) times a week.        Marland Kitchen omeprazole (PRILOSEC) 20 MG capsule Take 1 capsule (20 mg total) by mouth daily.  30 capsule  1  . progesterone (PROMETRIUM) 100 MG capsule Take 100 mg by mouth daily.        . SUMAtriptan (IMITREX) 100 MG tablet Take one tablet at onset of headache/aura. May repeat in one hour for continued headache  9 tablet  2     patient denies chest pain, shortness of  breath, orthopnea. Denies lower extremity edema, abdominal pain, change in appetite, change in bowel movements. Patient denies rashes, musculoskeletal complaints. No other specific complaints in a complete review of systems.   BP 112/84  Pulse 72  Temp(Src) 97.8 F (36.6 C) (Oral)  Wt 126 lb (57.153 kg)  Well-developed well-nourished female in no acute distress. HEENT exam atraumatic, normocephalic, extraocular muscles are intact. Neck is supple. No jugular venous distention no thyromegaly. Chest clear to auscultation without increased work of breathing. Cardiac exam S1 and S2 are regular. Abdominal exam active bowel sounds, soft, nontender. Extremities no edema. Neurologic exam she is alert without any motor sensory deficits. Gait is normal.

## 2011-02-26 ENCOUNTER — Ambulatory Visit (INDEPENDENT_AMBULATORY_CARE_PROVIDER_SITE_OTHER)
Admission: RE | Admit: 2011-02-26 | Discharge: 2011-02-26 | Disposition: A | Discharge status: Home / Self Care | Payer: BC Managed Care – PPO | Source: Ambulatory Visit | Attending: Internal Medicine | Admitting: Internal Medicine

## 2011-02-26 ENCOUNTER — Encounter: Payer: Self-pay | Admitting: Internal Medicine

## 2011-02-26 DIAGNOSIS — R0789 Other chest pain: Secondary | ICD-10-CM

## 2011-02-28 NOTE — Assessment & Plan Note (Signed)
Blood pressure is well controlled. Continue current medications. 

## 2011-03-04 ENCOUNTER — Ambulatory Visit (INDEPENDENT_AMBULATORY_CARE_PROVIDER_SITE_OTHER)
Admission: RE | Admit: 2011-03-04 | Discharge: 2011-03-04 | Disposition: A | Discharge status: Home / Self Care | Payer: BC Managed Care – PPO | Source: Ambulatory Visit | Attending: Internal Medicine | Admitting: Internal Medicine

## 2011-03-04 DIAGNOSIS — R918 Other nonspecific abnormal finding of lung field: Secondary | ICD-10-CM

## 2011-03-04 DIAGNOSIS — R9389 Abnormal findings on diagnostic imaging of other specified body structures: Secondary | ICD-10-CM

## 2011-03-04 MED ORDER — IOHEXOL 300 MG/ML  SOLN
100.0000 mL | Freq: Once | INTRAMUSCULAR | Status: AC | PRN
  Administered 2011-03-04: 100 mL via INTRAVENOUS

## 2011-03-09 ENCOUNTER — Other Ambulatory Visit: Payer: Self-pay | Admitting: Internal Medicine

## 2011-03-16 ENCOUNTER — Ambulatory Visit (INDEPENDENT_AMBULATORY_CARE_PROVIDER_SITE_OTHER): Payer: BC Managed Care – PPO | Admitting: Internal Medicine

## 2011-03-16 ENCOUNTER — Encounter: Payer: Self-pay | Admitting: Internal Medicine

## 2011-03-16 VITALS — BP 124/80 | Temp 97.9°F | Wt 126.0 lb

## 2011-03-16 DIAGNOSIS — J479 Bronchiectasis, uncomplicated: Secondary | ICD-10-CM | POA: Insufficient documentation

## 2011-03-16 DIAGNOSIS — R911 Solitary pulmonary nodule: Secondary | ICD-10-CM

## 2011-03-16 DIAGNOSIS — J984 Other disorders of lung: Secondary | ICD-10-CM

## 2011-03-16 HISTORY — DX: Bronchiectasis, uncomplicated: J47.9

## 2011-03-16 NOTE — Assessment & Plan Note (Signed)
F/u ct in 6 months Scheduled today  Discussed with patient and husband

## 2011-03-18 ENCOUNTER — Ambulatory Visit (HOSPITAL_COMMUNITY)
Admission: RE | Admit: 2011-03-18 | Discharge: 2011-03-18 | Disposition: A | Discharge status: Home / Self Care | Payer: BC Managed Care – PPO | Source: Ambulatory Visit | Attending: Obstetrics and Gynecology | Admitting: Obstetrics and Gynecology

## 2011-03-18 DIAGNOSIS — Z1231 Encounter for screening mammogram for malignant neoplasm of breast: Secondary | ICD-10-CM | POA: Insufficient documentation

## 2011-03-23 NOTE — Progress Notes (Signed)
Patient ID: Yvette Short, female   DOB: Sep 10, 1945, 65 y.o.   MRN: [id number] Comes in to discuss CT I discussed CT findings at length with patient and husband Total time 15 minutes all counseling-

## 2011-04-06 ENCOUNTER — Encounter: Payer: Self-pay | Admitting: *Deleted

## 2011-04-13 ENCOUNTER — Encounter: Payer: Self-pay | Admitting: Internal Medicine

## 2011-04-13 ENCOUNTER — Ambulatory Visit (INDEPENDENT_AMBULATORY_CARE_PROVIDER_SITE_OTHER): Payer: BC Managed Care – PPO | Admitting: Internal Medicine

## 2011-04-13 VITALS — BP 112/72 | HR 82 | Ht 65.0 in | Wt 126.0 lb

## 2011-04-13 DIAGNOSIS — K219 Gastro-esophageal reflux disease without esophagitis: Secondary | ICD-10-CM

## 2011-04-13 DIAGNOSIS — R079 Chest pain, unspecified: Secondary | ICD-10-CM | POA: Insufficient documentation

## 2011-04-13 MED ORDER — OMEPRAZOLE 20 MG PO CPDR: 20.0000 mg | DELAYED_RELEASE_CAPSULE | Freq: Two times a day (BID) | ORAL | Status: DC

## 2011-04-13 NOTE — Progress Notes (Signed)
Yvette Short 13-May-1945 MRN [id number]     History of Present Illness:  This is a 66 year old white female with gastroesophageal reflux. She has had chest tightness which improved tremendously after starting Prilosec 20 mg daily. We have seen her in the past for gastroesophageal reflux. She had an upper endoscopy in July 2008. She has a history of the H. pylori antibody which was treated in 2007 with triple therapy. She does not drink alcohol or smoke. Her symptoms started after she took a course of ibuprofen for a rib contusion. The omeprazole has improved her reflux 80%. She denies dysphagia or odynophagia but has an occasional cough at night. She has a positive family history of colon cancer in her father and has been up-to-date on her colonoscopy. She had those completed in 1998, 2004 and June 2009. Her next exam will be due in June 2014.   Past Medical History  Diagnosis Date  . HYPERLIPIDEMIA 11/09/2006  . ANXIETY 07/06/2007  . GERD 11/09/2006  . Migraine   . Hypertension   . Diverticulosis    Past Surgical History  Procedure Date  . Lipoma excision     of back    reports that she quit smoking about 36 years ago. Her smoking use included Cigarettes. She has a 325 pack-year smoking history. She does not have any smokeless tobacco history on file. She reports that she does not use illicit drugs. Her alcohol history not on file. family history includes Colon cancer (age of onset:58) in her father and Dementia in her mother. Allergies  Allergen Reactions  . Tetracycline Hcl     REACTION: rash        Review of Systems: Denies dysphagia odynophagia. Positive full chest tightness  The remainder of the 10 point ROS is negative except as outlined in H&P   Physical Exam: General appearance  Well developed, in no distress. Eyes- non icteric. HEENT nontraumatic, normocephalic. Normal boys. No cough Mouth no lesions, tongue papillated, no cheilosis. Neck supple without  adenopathy, thyroid not enlarged, no carotid bruits, no JVD. Lungs Clear to auscultation bilaterally. Cor normal S1, normal S2, regular rhythm, no murmur,  quiet precordium. Abdomen: Soft nontender abdomen with normal active bowel sounds. No distention. Liver edge at costal margin. Rectal: Not done Extremities no pedal edema. Skin no lesions. Neurological alert and oriented x 3. Psychological normal mood and affect.  Assessment and Plan:  Problem # 1 Chronic gastroesophageal reflux initially refractory to antireflux measures. It has now markedly improved on Prilosec 20 mg a day. She will increase her Prilosec to 20 mg twice a day and then reduce it to one a day when her symptoms subside. She has a history of treated H. pylori. She would like to continue her medical treatment before considering another endoscopy.  Family history of colon cancer. She is up-to-date on her colonoscopy. Her next exam will be due in June 2014.   04/13/2011 Lina Sar

## 2011-04-13 NOTE — Patient Instructions (Signed)
We have sent the following medications to your pharmacy for you to pick up at your convenience: Omeprazole We have given you a brochure of information regarding GERD. CC: Dr Birdie Sons

## 2011-07-06 ENCOUNTER — Ambulatory Visit (INDEPENDENT_AMBULATORY_CARE_PROVIDER_SITE_OTHER): Payer: BC Managed Care – PPO | Admitting: Family

## 2011-07-06 ENCOUNTER — Encounter: Payer: Self-pay | Admitting: Family

## 2011-07-06 VITALS — BP 120/88 | Temp 98.8°F | Wt 126.0 lb

## 2011-07-06 DIAGNOSIS — T148 Other injury of unspecified body region: Secondary | ICD-10-CM

## 2011-07-06 DIAGNOSIS — B354 Tinea corporis: Secondary | ICD-10-CM

## 2011-07-06 DIAGNOSIS — W57XXXA Bitten or stung by nonvenomous insect and other nonvenomous arthropods, initial encounter: Secondary | ICD-10-CM

## 2011-07-06 DIAGNOSIS — T148XXA Other injury of unspecified body region, initial encounter: Secondary | ICD-10-CM

## 2011-07-06 MED ORDER — CLOTRIMAZOLE-BETAMETHASONE 1-0.05 % EX CREA: TOPICAL_CREAM | Freq: Two times a day (BID) | CUTANEOUS | Status: DC

## 2011-07-06 NOTE — Progress Notes (Signed)
Subjective:    Patient ID: Yvette Short, female    DOB: 1945/09/09, 66 y.o.   MRN: [id number]  HPI 66 year old white female, nonsmoker, patient of Dr. Cato Mulligan in today after a tick bite approximately 3 weeks ago. She denies any fever, muscle aches, pain, or bread rash. However, at the site of the tick bite she has a dry, itchy rash, that is flesh colored. She's been applying the steroid cream to the area with no relief.   Review of Systems  Constitutional: Negative.   Respiratory: Negative.   Cardiovascular: Negative.   Skin: Positive for rash.       Right forearm  Hematological: Negative.        Past Medical History  Diagnosis Date  . HYPERLIPIDEMIA 11/09/2006  . ANXIETY 07/06/2007  . GERD 11/09/2006  . Migraine   . Hypertension   . Diverticulosis     History   Social History  . Marital Status: Married    Spouse Name: N/A    Number of Children: N/A  . Years of Education: N/A   Occupational History  . Not on file.   Social History Main Topics  . Smoking status: Former Smoker -- 2.5 packs/day for 130 years    Types: Cigarettes    Quit date: 07/28/1974  . Smokeless tobacco: Not on file  . Alcohol Use: Not on file  . Drug Use: No  . Sexually Active: Not on file   Other Topics Concern  . Not on file   Social History Narrative  . No narrative on file    Past Surgical History  Procedure Date  . Lipoma excision     of back    Family History  Problem Relation Age of Onset  . Dementia Mother   . Colon cancer Father 19    Allergies  Allergen Reactions  . Tetracycline Hcl     REACTION: rash    Current Outpatient Prescriptions on File Prior to Visit  Medication Sig Dispense Refill  . amLODipine (NORVASC) 5 MG tablet Take 1 tablet (5 mg total) by mouth daily.  30 tablet  11  . calcium carbonate (OS-CAL) 600 MG TABS Take 600 mg by mouth daily.      . Cholecalciferol (VITAMIN D) 2000 UNITS CAPS Take 1 capsule by mouth daily.        Marland Kitchen estradiol  (VIVELLE-DOT) 0.025 MG/24HR Place 1 patch onto the skin 2 (two) times a week.        . magnesium 30 MG tablet Take 30 mg by mouth 2 (two) times daily.      . progesterone (PROMETRIUM) 100 MG capsule Take 100 mg by mouth daily.        . SUMAtriptan (IMITREX) 100 MG tablet Take one tablet at onset of headache/aura. May repeat in one hour for continued headache  9 tablet  2  . vitamin C (ASCORBIC ACID) 500 MG tablet Take 500 mg by mouth daily.      Marland Kitchen omeprazole (PRILOSEC) 20 MG capsule Take 1 capsule (20 mg total) by mouth 2 (two) times daily.  60 capsule  4    BP 120/88  Temp(Src) 98.8 F (37.1 C) (Oral)  Wt 126 lb (57.153 kg)chart Objective:   Physical Exam  Constitutional: She is oriented to person, place, and time. She appears well-developed and well-nourished.  Neck: Normal range of motion. Neck supple.  Cardiovascular: Normal rate, regular rhythm and normal heart sounds.   Pulmonary/Chest: Effort normal and breath sounds normal.  Neurological:  She is alert and oriented to person, place, and time.  Skin: Skin is warm and dry. Rash noted.        1 cm rash noted to the right antecubital area. Raised, flesh toned. No drainage or s/s of infection.  Psychiatric: She has a normal mood and affect.          Assessment & Plan:  Assessment: Tick bite, tinea or pruritus   Plan: Lotrisone cream apply to the affected area twice a day. Patient upon the office is symptoms worsen or persist, recheck as scheduled or when necessary.

## 2011-09-07 ENCOUNTER — Ambulatory Visit (INDEPENDENT_AMBULATORY_CARE_PROVIDER_SITE_OTHER): Payer: BC Managed Care – PPO | Admitting: Internal Medicine

## 2011-09-07 VITALS — BP 115/68 | HR 72 | Temp 98.3°F | Wt 125.0 lb

## 2011-09-07 DIAGNOSIS — I10 Essential (primary) hypertension: Secondary | ICD-10-CM

## 2011-09-07 DIAGNOSIS — R911 Solitary pulmonary nodule: Secondary | ICD-10-CM

## 2011-09-07 NOTE — Progress Notes (Signed)
Patient ID: Yvette Short, female   DOB: [birth year], 66 y.o.   MRN: [id number]  htn-- home bps- not done.   she feels well, no complaints  Past Medical History  Diagnosis Date  . HYPERLIPIDEMIA 11/09/2006  . ANXIETY 07/06/2007  . GERD 11/09/2006  . Migraine   . Hypertension   . Diverticulosis     History   Social History  . Marital Status: Married    Spouse Name: N/A    Number of Children: N/A  . Years of Education: N/A   Occupational History  . Not on file.   Social History Main Topics  . Smoking status: Former Smoker -- 2.5 packs/day for 130 years    Types: Cigarettes    Quit date: 07/28/1974  . Smokeless tobacco: Not on file  . Alcohol Use: Not on file  . Drug Use: No  . Sexually Active: Not on file   Other Topics Concern  . Not on file   Social History Narrative  . No narrative on file    Past Surgical History  Procedure Date  . Lipoma excision     of back    Family History  Problem Relation Age of Onset  . Dementia Mother   . Colon cancer Father 1    Allergies  Allergen Reactions  . Tetracycline Hcl     REACTION: rash    Current Outpatient Prescriptions on File Prior to Visit  Medication Sig Dispense Refill  . amLODipine (NORVASC) 5 MG tablet Take 1 tablet (5 mg total) by mouth daily.  30 tablet  11  . calcium carbonate (OS-CAL) 600 MG TABS Take 600 mg by mouth daily.      . Cholecalciferol (VITAMIN D) 2000 UNITS CAPS Take 1 capsule by mouth daily.        Marland Kitchen estradiol (VIVELLE-DOT) 0.025 MG/24HR Place 1 patch onto the skin 2 (two) times a week.        . magnesium 30 MG tablet Take 30 mg by mouth 2 (two) times daily.      . progesterone (PROMETRIUM) 100 MG capsule Take 100 mg by mouth daily.        . vitamin C (ASCORBIC ACID) 500 MG tablet Take 500 mg by mouth daily.         patient denies chest pain, shortness of breath, orthopnea. Denies lower extremity edema, abdominal pain, change in appetite, change in bowel movements. Patient denies  rashes, musculoskeletal complaints. No other specific complaints in a complete review of systems.   BP 115/68  Pulse 72  Temp 98.3 F (36.8 C) (Oral)  Wt 125 lb (56.7 kg)  Well-developed well-nourished female in no acute distress. HEENT exam atraumatic, normocephalic, extraocular muscles are intact. Neck is supple. No jugular venous distention no thyromegaly. Chest clear to auscultation without increased work of breathing. Cardiac exam S1 and S2 are regular.

## 2011-09-09 NOTE — Assessment & Plan Note (Signed)
Controlled Continue same meds 

## 2011-09-09 NOTE — Assessment & Plan Note (Signed)
Schedule CT

## 2011-09-13 ENCOUNTER — Other Ambulatory Visit: Payer: Self-pay | Admitting: Internal Medicine

## 2011-09-13 DIAGNOSIS — R911 Solitary pulmonary nodule: Secondary | ICD-10-CM

## 2011-09-16 ENCOUNTER — Ambulatory Visit (INDEPENDENT_AMBULATORY_CARE_PROVIDER_SITE_OTHER)
Admission: RE | Admit: 2011-09-16 | Discharge: 2011-09-16 | Disposition: A | Discharge status: Home / Self Care | Payer: BC Managed Care – PPO | Source: Ambulatory Visit | Attending: Internal Medicine | Admitting: Internal Medicine

## 2011-09-16 DIAGNOSIS — R911 Solitary pulmonary nodule: Secondary | ICD-10-CM

## 2011-09-17 ENCOUNTER — Telehealth: Payer: Self-pay | Admitting: *Deleted

## 2011-09-17 ENCOUNTER — Other Ambulatory Visit: Payer: BC Managed Care – PPO

## 2011-09-17 NOTE — Telephone Encounter (Signed)
[facility] w/contact name & number for return call to discuss results & MD instructions/SLS Staff message reminder to [facility] to schedule repeat CT w/o contrast/6-mth & contact patient.

## 2011-09-17 NOTE — Telephone Encounter (Signed)
Message copied by Regis Bill on Fri Sep 17, 2011  8:30 AM ------      Message from: Lindley Magnus      Created: Fri Sep 17, 2011  7:32 AM       Call patient, CT scan is unchanged. Radiology suggest repeating one more CT scan. I think this can be done in 6 months. This needs to be followed more closely than some because the lesion has some irregular margins. Please schedule CT scan of the chest without contrast in 6 months.

## 2011-09-20 ENCOUNTER — Telehealth: Payer: Self-pay | Admitting: *Deleted

## 2011-09-20 DIAGNOSIS — R911 Solitary pulmonary nodule: Secondary | ICD-10-CM

## 2011-09-20 NOTE — Telephone Encounter (Signed)
Message copied by Jacqualyn Posey on Mon Sep 20, 2011  2:42 PM ------      Message from: Regis Bill      Created: Fri Sep 17, 2011  8:38 AM      Regarding: Schedule CT w/o contrast       Result Note  09/17/2011        Call patient, CT scan is unchanged. Radiology suggest repeating one more CT scan. I think this can be done in 6 months. This needs to be followed more closely than some because the lesion has some irregular margins.       Please schedule CT scan of the chest without contrast in 6 months.

## 2011-09-20 NOTE — Telephone Encounter (Signed)
Pt aware, referral order entered

## 2011-12-21 ENCOUNTER — Encounter: Payer: Self-pay | Admitting: Internal Medicine

## 2011-12-21 ENCOUNTER — Ambulatory Visit (INDEPENDENT_AMBULATORY_CARE_PROVIDER_SITE_OTHER): Payer: Medicare Other

## 2011-12-21 DIAGNOSIS — Z23 Encounter for immunization: Secondary | ICD-10-CM

## 2012-01-05 ENCOUNTER — Other Ambulatory Visit: Payer: Self-pay | Admitting: Internal Medicine

## 2012-02-10 ENCOUNTER — Other Ambulatory Visit (HOSPITAL_COMMUNITY): Payer: Self-pay | Admitting: Obstetrics and Gynecology

## 2012-02-10 DIAGNOSIS — Z1231 Encounter for screening mammogram for malignant neoplasm of breast: Secondary | ICD-10-CM

## 2012-03-08 ENCOUNTER — Encounter: Payer: BC Managed Care – PPO | Admitting: Internal Medicine

## 2012-03-17 ENCOUNTER — Other Ambulatory Visit: Payer: Medicare Other

## 2012-03-20 ENCOUNTER — Ambulatory Visit (HOSPITAL_COMMUNITY)
Admission: RE | Admit: 2012-03-20 | Discharge: 2012-03-20 | Disposition: A | Discharge status: Home / Self Care | Payer: Medicare Other | Source: Ambulatory Visit | Attending: Obstetrics and Gynecology | Admitting: Obstetrics and Gynecology

## 2012-03-20 DIAGNOSIS — Z1231 Encounter for screening mammogram for malignant neoplasm of breast: Secondary | ICD-10-CM

## 2012-03-23 ENCOUNTER — Ambulatory Visit (INDEPENDENT_AMBULATORY_CARE_PROVIDER_SITE_OTHER)
Admission: RE | Admit: 2012-03-23 | Discharge: 2012-03-23 | Disposition: A | Discharge status: Home / Self Care | Payer: Medicare Other | Source: Ambulatory Visit | Attending: Internal Medicine | Admitting: Internal Medicine

## 2012-03-23 DIAGNOSIS — R911 Solitary pulmonary nodule: Secondary | ICD-10-CM

## 2012-03-24 ENCOUNTER — Other Ambulatory Visit: Payer: Self-pay | Admitting: Internal Medicine

## 2012-03-24 DIAGNOSIS — R911 Solitary pulmonary nodule: Secondary | ICD-10-CM

## 2012-04-10 ENCOUNTER — Telehealth: Payer: Self-pay | Admitting: Internal Medicine

## 2012-04-10 ENCOUNTER — Ambulatory Visit (INDEPENDENT_AMBULATORY_CARE_PROVIDER_SITE_OTHER): Payer: Medicare PPO | Admitting: Internal Medicine

## 2012-04-10 ENCOUNTER — Encounter: Payer: Self-pay | Admitting: Internal Medicine

## 2012-04-10 VITALS — BP 172/100 | HR 72 | Temp 98.0°F | Ht 65.5 in | Wt 126.0 lb

## 2012-04-10 DIAGNOSIS — K1370 Unspecified lesions of oral mucosa: Secondary | ICD-10-CM

## 2012-04-10 DIAGNOSIS — K1379 Other lesions of oral mucosa: Secondary | ICD-10-CM

## 2012-04-10 DIAGNOSIS — Z0000 Encounter for general adult medical examination without abnormal findings: Secondary | ICD-10-CM

## 2012-04-10 DIAGNOSIS — R109 Unspecified abdominal pain: Secondary | ICD-10-CM

## 2012-04-10 DIAGNOSIS — I10 Essential (primary) hypertension: Secondary | ICD-10-CM

## 2012-04-10 DIAGNOSIS — Z23 Encounter for immunization: Secondary | ICD-10-CM

## 2012-04-10 LAB — CBC WITH DIFFERENTIAL/PLATELET
Basophils Relative: 1.1 % (ref 0.0–3.0)
Eosinophils Absolute: 0.2 10*3/uL (ref 0.0–0.7)
Eosinophils Relative: 2.9 % (ref 0.0–5.0)
HCT: 40 % (ref 36.0–46.0)
Hemoglobin: 13.2 g/dL (ref 12.0–15.0)
Lymphs Abs: 2 10*3/uL (ref 0.7–4.0)
MCHC: 32.9 g/dL (ref 30.0–36.0)
MCV: 79.3 fl (ref 78.0–100.0)
Monocytes Absolute: 0.4 10*3/uL (ref 0.1–1.0)
Neutro Abs: 2.8 10*3/uL (ref 1.4–7.7)
RBC: 5.05 Mil/uL (ref 3.87–5.11)
WBC: 5.4 10*3/uL (ref 4.5–10.5)

## 2012-04-10 LAB — HEPATIC FUNCTION PANEL
ALT: 35 U/L (ref 0–35)
Albumin: 4.4 g/dL (ref 3.5–5.2)
Alkaline Phosphatase: 70 U/L (ref 39–117)
Bilirubin, Direct: 0 mg/dL (ref 0.0–0.3)
Total Protein: 7.2 g/dL (ref 6.0–8.3)

## 2012-04-10 LAB — BASIC METABOLIC PANEL
CO2: 28 mEq/L (ref 19–32)
Calcium: 9.5 mg/dL (ref 8.4–10.5)
Chloride: 105 mEq/L (ref 96–112)
Glucose, Bld: 90 mg/dL (ref 70–99)
Sodium: 141 mEq/L (ref 135–145)

## 2012-04-10 LAB — LDL CHOLESTEROL, DIRECT: Direct LDL: 114.1 mg/dL

## 2012-04-10 MED ORDER — AMLODIPINE BESYLATE 5 MG PO TABS: 5.0000 mg | ORAL_TABLET | Freq: Every day | ORAL | Status: DC

## 2012-04-10 NOTE — Telephone Encounter (Signed)
Pt was told MD was going to call her in a cream for her lip, but it was not at pharm. Pls call this into [facility]

## 2012-04-10 NOTE — Progress Notes (Signed)
Patient ID: Yvette Short, female   DOB: 1946/02/18, 67 y.o.   MRN: [id number] cpx  Past Medical History  Diagnosis Date  . HYPERLIPIDEMIA 11/09/2006  . ANXIETY 07/06/2007  . GERD 11/09/2006  . Migraine   . Hypertension   . Diverticulosis     History   Social History  . Marital Status: Married    Spouse Name: N/A    Number of Children: N/A  . Years of Education: N/A   Occupational History  . Not on file.   Social History Main Topics  . Smoking status: Former Smoker -- 2.5 packs/day for 130 years    Types: Cigarettes    Quit date: 07/28/1974  . Smokeless tobacco: Not on file  . Alcohol Use: Not on file  . Drug Use: No  . Sexually Active: Not on file   Other Topics Concern  . Not on file   Social History Narrative  . No narrative on file    Past Surgical History  Procedure Date  . Lipoma excision     of back    Family History  Problem Relation Age of Onset  . Dementia Mother   . Colon cancer Father 19    Allergies  Allergen Reactions  . Tetracycline Hcl     REACTION: rash    Current Outpatient Prescriptions on File Prior to Visit  Medication Sig Dispense Refill  . amLODipine (NORVASC) 5 MG tablet TAKE 1 TABLET BY MOUTH EVERY DAY  30 tablet  11  . calcium carbonate (OS-CAL) 600 MG TABS Take 600 mg by mouth daily.      . Cholecalciferol (VITAMIN D) 2000 UNITS CAPS Take 1 capsule by mouth daily.        Marland Kitchen estradiol (VIVELLE-DOT) 0.025 MG/24HR Place 1 patch onto the skin 2 (two) times a week.        . progesterone (PROMETRIUM) 100 MG capsule Take 100 mg by mouth daily.        . vitamin C (ASCORBIC ACID) 500 MG tablet Take 500 mg by mouth daily.         patient denies chest pain, shortness of breath, orthopnea. Denies lower extremity edema, abdominal pain, change in appetite, change in bowel movements. Patient denies rashes, musculoskeletal complaints. No other specific complaints in a complete review of systems.   BP 172/100  Pulse 72  Temp 98 F (36.7  C) (Oral)  Ht 5' 5.5" (1.664 m)  Wt 126 lb (57.153 kg)  BMI 20.65 kg/m2  Well-developed well-nourished female in no acute distress. HEENT exam atraumatic, normocephalic, extraocular muscles are intact. Neck is supple. No jugular venous distention no thyromegaly. Chest clear to auscultation without increased work of breathing. Cardiac exam S1 and S2 are regular. Abdominal exam active bowel sounds, soft, nontender. Extremities no edema. Neurologic exam she is alert without any motor sensory deficits. Gait is normal.  A/p- well visit  Health maint UTD  Fatigue chelitis- angle of mouth- trial nyst/triam  Note weight-- she also has some abdominal discomfort-- check celiac panel

## 2012-04-10 NOTE — Telephone Encounter (Signed)
I don't see a cream on her med list.  Meds were confirmed at [facility]

## 2012-04-11 LAB — CELIAC PANEL 10
Gliadin IgA: 7.3 U/mL (ref <20)
Gliadin IgG: 6.3 U/mL (ref <20)
IgA: 169 mg/dL (ref 69–380)
Tissue Transglut Ab: 4.6 U/mL (ref <20)

## 2012-04-13 MED ORDER — NYSTATIN-TRIAMCINOLONE 100000-0.1 UNIT/GM-% EX OINT: TOPICAL_OINTMENT | Freq: Two times a day (BID) | CUTANEOUS | Status: DC

## 2012-04-13 NOTE — Telephone Encounter (Signed)
Cream has been called in.

## 2012-04-13 NOTE — Telephone Encounter (Signed)
Pt aware.

## 2012-04-26 ENCOUNTER — Encounter (INDEPENDENT_AMBULATORY_CARE_PROVIDER_SITE_OTHER): Payer: Medicare PPO

## 2012-04-26 DIAGNOSIS — I10 Essential (primary) hypertension: Secondary | ICD-10-CM

## 2012-08-16 ENCOUNTER — Encounter: Payer: Self-pay | Admitting: Internal Medicine

## 2012-08-22 ENCOUNTER — Encounter: Payer: Self-pay | Admitting: Internal Medicine

## 2012-10-09 ENCOUNTER — Encounter: Payer: Self-pay | Admitting: Internal Medicine

## 2012-10-09 ENCOUNTER — Ambulatory Visit (AMBULATORY_SURGERY_CENTER): Payer: Medicare PPO | Admitting: *Deleted

## 2012-10-09 VITALS — Ht 65.5 in | Wt 127.0 lb

## 2012-10-09 DIAGNOSIS — Z1211 Encounter for screening for malignant neoplasm of colon: Secondary | ICD-10-CM

## 2012-10-09 MED ORDER — MOVIPREP 100 G PO SOLR: ORAL | Status: DC

## 2012-10-09 NOTE — Progress Notes (Signed)
Patient denies any allergies to eggs or soy. 

## 2012-10-20 ENCOUNTER — Encounter: Payer: Medicare PPO | Admitting: Internal Medicine

## 2012-10-25 ENCOUNTER — Encounter: Payer: Self-pay | Admitting: Internal Medicine

## 2012-10-25 ENCOUNTER — Ambulatory Visit (AMBULATORY_SURGERY_CENTER): Payer: Medicare PPO | Admitting: Internal Medicine

## 2012-10-25 VITALS — BP 144/77 | HR 70 | Temp 97.4°F | Resp 14 | Ht 65.0 in | Wt 127.0 lb

## 2012-10-25 DIAGNOSIS — D126 Benign neoplasm of colon, unspecified: Secondary | ICD-10-CM

## 2012-10-25 DIAGNOSIS — Z1211 Encounter for screening for malignant neoplasm of colon: Secondary | ICD-10-CM

## 2012-10-25 MED ORDER — SODIUM CHLORIDE 0.9 % IV SOLN: 500.0000 mL | INTRAVENOUS | Status: DC

## 2012-10-25 NOTE — Progress Notes (Signed)
Lidocaine-40mg IV prior to Propofol InductionPropofol given over incremental dosages 

## 2012-10-25 NOTE — Op Note (Signed)
[facility] [address] [place], [address]   COLONOSCOPY PROCEDURE REPORT  PATIENT: Yvette Short, Yvette Short  MR#: [id number] BIRTHDATE: [birth year] , 67  yrs. old GENDER: Female ENDOSCOPIST: Hart Carwin, MD REFERRED [id number] colonoscopy PROCEDURE DATE:  10/25/2012 PROCEDURE:   Colonoscopy with cold biopsy polypectomy Indication: parent with colon cancer, prior colon in 1998, 2004 and 2009 First Screening Colonoscopy - Avg.  risk and is 50 yrs.  old or older - No.  Prior Negative Screening - Now for repeat screening. Above average risk  - Now for follow-up colonoscopy & has been > or = to 3 yrs.  N/A  Polyps Removed Today? Yes. ASA CLASS:   Class II INDICATIONS:Patient's immediate family history of colon cancer. MEDICATIONS: MAC sedation, administered by CRNA and Propofol (Diprivan)  DESCRIPTION OF PROCEDURE:   After the risks benefits and alternatives of the procedure were thoroughly explained, informed consent was obtained.  A digital rectal exam revealed no abnormalities of the rectum.   The LB [id number]  endoscope was introduced through the anus and advanced to the cecum, which was identified by both the appendix and ileocecal valve. No adverse events experienced.   The quality of the prep was good, using MoviPrep  The instrument was then slowly withdrawn as the colon was fully examined.      COLON FINDINGS: A sessile polyp was found in the sigmoid colon.at 20 cm, 3 mm diminutive polyp removed with cold forecepts  Retroflexed views revealed no abnormalities.     The scope was withdrawn and the procedure completed.There was mild diverticulosis of the sigmoid colon COMPLICATIONS: There were no complications.  ENDOSCOPIC IMPRESSION: Diminutive polyp sigmoid colon, s/p cold biopsy polypectomy mild diverticulosis of the sigmoid colon RECOMMENDATIONS: Await pathology results   eSigned:  Hart Carwin, MD 10/25/2012 9:10  AM   cc:   PATIENT NAME:  Yvette Short MR#: [id number]

## 2012-10-25 NOTE — Progress Notes (Signed)
Gas discomfort alleviated . Discharged to home.  Patient did not experience any of the following events: a burn prior to discharge; a fall within the facility; wrong site/side/patient/procedure/implant event; or a hospital transfer or hospital admission upon discharge from the facility. (G8907)Patient did not have preoperative order for IV antibiotic SSI prophylaxis. [id number])

## 2012-10-25 NOTE — Progress Notes (Signed)
Called to room to assist during endoscopic procedure.  Patient ID and intended procedure confirmed with present staff. Received instructions for my participation in the procedure from the performing physician.  

## 2012-10-25 NOTE — Patient Instructions (Addendum)

## 2012-10-26 ENCOUNTER — Telehealth: Payer: Self-pay | Admitting: *Deleted

## 2012-10-26 NOTE — Telephone Encounter (Signed)
  Follow up Call-  Call back number 10/25/2012  Post procedure Call Back phone  # [phone]  Permission to leave phone message Yes     Patient questions:  Do you have a fever, pain , or abdominal swelling? no Pain Score  0 *  Have you tolerated food without any problems? yes  Have you been able to return to your normal activities? yes  Do you have any questions about your discharge instructions: Diet   no Medications  no Follow up visit  no  Do you have questions or concerns about your Care? no  Actions: * If pain score is 4 or above: No action needed, pain <4.  Patient stating she vomited on the way home yesterday. Following vomiting and passage of air both rectally and orally she felt much better. No fever, or pain today and able to eat without difficulty.

## 2012-10-31 ENCOUNTER — Encounter: Payer: Self-pay | Admitting: Internal Medicine

## 2013-01-03 ENCOUNTER — Ambulatory Visit (INDEPENDENT_AMBULATORY_CARE_PROVIDER_SITE_OTHER): Payer: Medicare PPO

## 2013-01-03 DIAGNOSIS — Z23 Encounter for immunization: Secondary | ICD-10-CM

## 2013-01-16 ENCOUNTER — Encounter: Payer: Self-pay | Admitting: Internal Medicine

## 2013-01-29 ENCOUNTER — Other Ambulatory Visit (INDEPENDENT_AMBULATORY_CARE_PROVIDER_SITE_OTHER): Payer: Medicare PPO

## 2013-01-29 DIAGNOSIS — E039 Hypothyroidism, unspecified: Secondary | ICD-10-CM

## 2013-02-02 NOTE — Progress Notes (Signed)
Quick Note:  Left a message for return call. ______ 

## 2013-02-05 NOTE — Progress Notes (Signed)
Quick Note:  Called and spoke with pt and pt is aware. ______ 

## 2013-02-28 ENCOUNTER — Other Ambulatory Visit (HOSPITAL_COMMUNITY): Payer: Self-pay | Admitting: Obstetrics and Gynecology

## 2013-02-28 DIAGNOSIS — Z1231 Encounter for screening mammogram for malignant neoplasm of breast: Secondary | ICD-10-CM

## 2013-03-21 ENCOUNTER — Ambulatory Visit (HOSPITAL_COMMUNITY)
Admission: RE | Admit: 2013-03-21 | Discharge: 2013-03-21 | Disposition: A | Discharge status: Home / Self Care | Payer: Medicare PPO | Source: Ambulatory Visit | Attending: Obstetrics and Gynecology | Admitting: Obstetrics and Gynecology

## 2013-03-21 DIAGNOSIS — Z1231 Encounter for screening mammogram for malignant neoplasm of breast: Secondary | ICD-10-CM

## 2013-04-04 ENCOUNTER — Other Ambulatory Visit: Payer: Self-pay | Admitting: Internal Medicine

## 2013-04-23 ENCOUNTER — Encounter: Payer: Medicare PPO | Admitting: Internal Medicine

## 2013-05-07 ENCOUNTER — Encounter: Payer: Self-pay | Admitting: Internal Medicine

## 2013-05-07 ENCOUNTER — Ambulatory Visit (INDEPENDENT_AMBULATORY_CARE_PROVIDER_SITE_OTHER): Payer: Medicare PPO | Admitting: Internal Medicine

## 2013-05-07 VITALS — BP 142/92 | HR 68 | Temp 98.0°F | Ht 65.0 in | Wt 128.0 lb

## 2013-05-07 DIAGNOSIS — E785 Hyperlipidemia, unspecified: Secondary | ICD-10-CM

## 2013-05-07 DIAGNOSIS — I10 Essential (primary) hypertension: Secondary | ICD-10-CM

## 2013-05-07 DIAGNOSIS — Z0000 Encounter for general adult medical examination without abnormal findings: Secondary | ICD-10-CM

## 2013-05-07 DIAGNOSIS — R911 Solitary pulmonary nodule: Secondary | ICD-10-CM

## 2013-05-07 LAB — POCT URINALYSIS DIPSTICK
Bilirubin, UA: NEGATIVE
Blood, UA: NEGATIVE
Glucose, UA: NEGATIVE
Leukocytes, UA: NEGATIVE
Nitrite, UA: NEGATIVE
SPEC GRAV UA: 1.02
Urobilinogen, UA: 0.2
pH, UA: 7

## 2013-05-07 LAB — CBC WITH DIFFERENTIAL/PLATELET
BASOS PCT: 0.8 % (ref 0.0–3.0)
Basophils Absolute: 0 10*3/uL (ref 0.0–0.1)
Eosinophils Absolute: 0.2 10*3/uL (ref 0.0–0.7)
Eosinophils Relative: 3 % (ref 0.0–5.0)
HEMATOCRIT: 46.1 % — AB (ref 36.0–46.0)
HEMOGLOBIN: 15.3 g/dL — AB (ref 12.0–15.0)
Lymphocytes Relative: 36.8 % (ref 12.0–46.0)
Lymphs Abs: 2.3 10*3/uL (ref 0.7–4.0)
MCHC: 33.2 g/dL (ref 30.0–36.0)
MCV: 87.1 fl (ref 78.0–100.0)
Monocytes Absolute: 0.5 10*3/uL (ref 0.1–1.0)
Monocytes Relative: 7.3 % (ref 3.0–12.0)
NEUTROS PCT: 52.1 % (ref 43.0–77.0)
Neutro Abs: 3.3 10*3/uL (ref 1.4–7.7)
Platelets: 243 10*3/uL (ref 150.0–400.0)
RBC: 5.29 Mil/uL — AB (ref 3.87–5.11)
RDW: 15.4 % — ABNORMAL HIGH (ref 11.5–14.6)
WBC: 6.3 10*3/uL (ref 4.5–10.5)

## 2013-05-07 LAB — TSH: TSH: 3.16 u[IU]/mL (ref 0.35–5.50)

## 2013-05-07 NOTE — Progress Notes (Signed)
Pre visit review using our clinic review tool, if applicable. No additional management support is needed unless otherwise documented below in the visit note. 

## 2013-05-07 NOTE — Progress Notes (Signed)
CPX No home bps Past Medical History  Diagnosis Date  . HYPERLIPIDEMIA 11/09/2006  . ANXIETY 07/06/2007  . GERD 11/09/2006  . Migraine   . Hypertension   . Diverticulosis     History   Social History  . Marital Status: Married    Spouse Name: N/A    Number of Children: N/A  . Years of Education: N/A   Occupational History  . Not on file.   Social History Main Topics  . Smoking status: Former Smoker -- 2.50 packs/day for 130 years    Types: Cigarettes    Quit date: 07/28/1974  . Smokeless tobacco: Never Used  . Alcohol Use: 1.2 oz/week    2 Cans of beer per week  . Drug Use: No  . Sexual Activity: Not on file   Other Topics Concern  . Not on file   Social History Narrative  . No narrative on file    Past Surgical History  Procedure Laterality Date  . Lipoma excision      of back  . Tubal ligation      Family History  Problem Relation Age of Onset  . Dementia Mother   . Colon cancer Father 29    Allergies  Allergen Reactions  . Tetracycline Hcl Rash    REACTION: rash    Current Outpatient Prescriptions on File Prior to Visit  Medication Sig Dispense Refill  . amLODipine (NORVASC) 5 MG tablet TAKE 1 TABLET BY MOUTH EVERY DAY  90 tablet  0  . calcium carbonate (OS-CAL) 600 MG TABS Take 600 mg by mouth daily.      . Cholecalciferol (VITAMIN D) 2000 UNITS CAPS Take 1 capsule by mouth daily.        Marland Kitchen estradiol (VIVELLE-DOT) 0.025 MG/24HR Place 1 patch onto the skin 2 (two) times a week.        . fish oil-omega-3 fatty acids 1000 MG capsule Take 2 g by mouth daily.      . progesterone (PROMETRIUM) 100 MG capsule Take 100 mg by mouth daily.        . vitamin C (ASCORBIC ACID) 500 MG tablet Take 500 mg by mouth daily.       No current facility-administered medications on file prior to visit.     patient denies chest pain, shortness of breath, orthopnea. Denies lower extremity edema, abdominal pain, change in appetite, change in bowel movements. Patient denies  rashes, musculoskeletal complaints. No other specific complaints in a complete review of systems.   BP 142/92  Pulse 68  Temp(Src) 98 F (36.7 C) (Oral)  Ht 5\' 5"  (1.651 m)  Wt 128 lb (58.06 kg)  BMI 21.30 kg/m2  Well-developed well-nourished female in no acute distress. HEENT exam atraumatic, normocephalic, extraocular muscles are intact. Neck is supple. No jugular venous distention no thyromegaly. Chest clear to auscultation without increased work of breathing. Cardiac exam S1 and S2 are regular. Abdominal exam active bowel sounds, soft, nontender. Extremities no edema. Neurologic exam she is alert without any motor sensory deficits. Gait is normal.   Well visit- health maint UTD

## 2013-05-08 LAB — HEPATIC FUNCTION PANEL
ALT: 36 U/L — AB (ref 0–35)
AST: 36 U/L (ref 0–37)
Albumin: 4.6 g/dL (ref 3.5–5.2)
Alkaline Phosphatase: 64 U/L (ref 39–117)
BILIRUBIN TOTAL: 0.8 mg/dL (ref 0.3–1.2)
Bilirubin, Direct: 0.1 mg/dL (ref 0.0–0.3)
Total Protein: 7.5 g/dL (ref 6.0–8.3)

## 2013-05-08 LAB — LIPID PANEL
CHOL/HDL RATIO: 3
CHOLESTEROL: 246 mg/dL — AB (ref 0–200)
HDL: 89.2 mg/dL (ref >39.00)
Triglycerides: 140 mg/dL (ref 0.0–149.0)
VLDL: 28 mg/dL (ref 0.0–40.0)

## 2013-05-08 LAB — BASIC METABOLIC PANEL
BUN: 13 mg/dL (ref 6–23)
CO2: 22 mEq/L (ref 19–32)
CREATININE: 0.8 mg/dL (ref 0.4–1.2)
Calcium: 10 mg/dL (ref 8.4–10.5)
Chloride: 110 mEq/L (ref 96–112)
GFR: 79.58 mL/min (ref >60.00)
Glucose, Bld: 79 mg/dL (ref 70–99)
Potassium: 4.5 mEq/L (ref 3.5–5.1)
Sodium: 149 mEq/L — ABNORMAL HIGH (ref 135–145)

## 2013-05-08 LAB — LDL CHOLESTEROL, DIRECT: Direct LDL: 158.2 mg/dL

## 2013-05-16 ENCOUNTER — Other Ambulatory Visit (INDEPENDENT_AMBULATORY_CARE_PROVIDER_SITE_OTHER): Payer: Medicare PPO

## 2013-05-16 DIAGNOSIS — I10 Essential (primary) hypertension: Secondary | ICD-10-CM

## 2013-05-16 LAB — BASIC METABOLIC PANEL
BUN: 16 mg/dL (ref 6–23)
CHLORIDE: 101 meq/L (ref 96–112)
CO2: 29 mEq/L (ref 19–32)
Calcium: 9.4 mg/dL (ref 8.4–10.5)
Creatinine, Ser: 0.7 mg/dL (ref 0.4–1.2)
GFR: 85.99 mL/min (ref >60.00)
Glucose, Bld: 90 mg/dL (ref 70–99)
POTASSIUM: 4.2 meq/L (ref 3.5–5.1)
SODIUM: 137 meq/L (ref 135–145)

## 2013-06-27 ENCOUNTER — Other Ambulatory Visit: Payer: Self-pay | Admitting: Internal Medicine

## 2013-12-26 ENCOUNTER — Ambulatory Visit (INDEPENDENT_AMBULATORY_CARE_PROVIDER_SITE_OTHER): Payer: Medicare PPO

## 2013-12-26 DIAGNOSIS — Z23 Encounter for immunization: Secondary | ICD-10-CM

## 2014-01-02 ENCOUNTER — Other Ambulatory Visit: Payer: Self-pay | Admitting: Obstetrics and Gynecology

## 2014-01-03 LAB — CYTOLOGY - PAP

## 2014-02-19 ENCOUNTER — Other Ambulatory Visit (HOSPITAL_COMMUNITY): Payer: Self-pay | Admitting: Obstetrics and Gynecology

## 2014-02-19 DIAGNOSIS — Z1231 Encounter for screening mammogram for malignant neoplasm of breast: Secondary | ICD-10-CM

## 2014-03-12 ENCOUNTER — Other Ambulatory Visit (HOSPITAL_COMMUNITY): Payer: Self-pay | Admitting: Obstetrics and Gynecology

## 2014-03-12 DIAGNOSIS — R911 Solitary pulmonary nodule: Secondary | ICD-10-CM

## 2014-03-19 ENCOUNTER — Ambulatory Visit (HOSPITAL_COMMUNITY)
Admission: RE | Admit: 2014-03-19 | Discharge: 2014-03-19 | Disposition: A | Discharge status: Home / Self Care | Payer: Medicare PPO | Source: Ambulatory Visit | Attending: Obstetrics and Gynecology | Admitting: Obstetrics and Gynecology

## 2014-03-19 DIAGNOSIS — R911 Solitary pulmonary nodule: Secondary | ICD-10-CM | POA: Insufficient documentation

## 2014-03-26 ENCOUNTER — Ambulatory Visit (HOSPITAL_COMMUNITY)
Admission: RE | Admit: 2014-03-26 | Discharge: 2014-03-26 | Disposition: A | Discharge status: Home / Self Care | Payer: Medicare PPO | Source: Ambulatory Visit | Attending: Obstetrics and Gynecology | Admitting: Obstetrics and Gynecology

## 2014-03-26 DIAGNOSIS — Z1231 Encounter for screening mammogram for malignant neoplasm of breast: Secondary | ICD-10-CM | POA: Insufficient documentation

## 2014-05-22 ENCOUNTER — Ambulatory Visit: Payer: Medicare PPO | Admitting: Family Medicine

## 2014-05-31 ENCOUNTER — Telehealth: Payer: Self-pay | Admitting: Pediatrics

## 2014-05-31 NOTE — Telephone Encounter (Signed)
[facility] Patient Name: Yvette Short DOB: 03/23/1946 Initial Comment Caller states she has appt later, wants to know what to do for leg pain till then Nurse Assessment Nurse: Mechele Dawley, RN, Amy Date/Time Eilene Ghazi Time): 05/31/2014 10:05:33 AM Confirm and document reason for call. If symptomatic, describe symptoms. ---CALLER STATES THAT SHE HAS AN APPT WITH DR. Tamala Julian ON THE 15TH. SHE STATES THE PAIN IS IN THE RIGHT THIGH AREA IN THE FRONT. SHE STATES THAT IT IS WRAPPING AROUND THE KNEE. SHE STATES SHE HAS BEEN HAVING PAIN IN THE LEG FOR ABOUT 6 MONTHS. SHE STATES IT GOES AWAY AND THEN IT WILL COME BACK. 2/10 ON THE PAIN SCALE. SHE STATES THAT IS COULD BE STRESS SHE IS NOT SURE. NO INJURY TO THE LEG. SHE STATES IT STARTED OUT AS PAIN IN THE BUTTOCK REGION. SHE STATES NOW IT IS THE LEG THAT IS HURTING. IT STOPS ABOVE THE KNEE AND AROUND THE KNEE AS WELL. SHE IS WORRIED ABOUT THIS . Has the patient traveled out of the country within the last 30 days? ---Not Applicable Does the patient require triage? ---Yes Related visit to physician within the last 2 weeks? ---No Does the PT have any chronic conditions? (i.e. diabetes, asthma, etc.) ---Yes List chronic conditions. ---HYPERTENSION, THYROID Guidelines Guideline Title Affirmed Question Affirmed Notes Leg Pain [1] MODERATE pain (e.g., interferes with normal activities, limping) AND [2] present > 3 days Final Disposition User See PCP When Office is Open (within 3 days) [place], Therapist, sports, Amy Comments OUTCOME FOR 3 DAY FOLLOW UP. PATIENT REPORTS THAT SHE HAS AN APPT WITH DR. Tamala Julian ON 15TH. SHE JUST WANTS TO KNOW WHAT SHE CAN DO UP UNTIL THEN. DID LOOK TO SEE IF COULD POSSIBLY GET HER IN PRIOR TO THAT TIME. THERE WERE NO OPENINGS. DID GIVE HER THE INFORMATION THAT IF SHE FEELS LIKE SHE IS GETTING WORSE, PAIN LEVEL INCREASES, WARM TO TOUCH, OR ANY WORSENING  IN CONDITIONS - PAIN TRAVELS DOWN THE LEG. TO GO AHEAD AND GO INTO URGENT CARE UNTIL CAN BE SEEN BY DR. Tamala Julian. PLEASE NOTE: All timestamps contained within this report are represented as [place] Standard Time. CONFIDENTIALTY NOTICE: This fax transmission is intended only for the addressee. It contains information that is legally privileged, confidential or otherwise protected from use or disclosure. If you are not the intended recipient, you are strictly prohibited from reviewing, disclosing, copying using or disseminating any of this information or taking any action in reliance on or regarding this information. If you have received this fax in error, please notify us immediately by telephone so that we can arrange for its return to [place]. Phone: [phone], Toll-Free: [phone], Fax: [phone] Page: 2 of 2 Call Id: [id number] Comments SHE STATES THAT SHE WILL DO SO. WILL CLOSE THIS CALL.

## 2014-06-03 ENCOUNTER — Telehealth: Payer: Self-pay | Admitting: *Deleted

## 2014-06-03 NOTE — Telephone Encounter (Signed)
[facility] Patient Name: Yvette Short Gender: Female DOB: 05/22/1945 Age: 69 Y 4 M 4 D Return Phone Number: [phone] (Primary), [phone] (Secondary) Address: City/State/Zip: [place] [address] Client [facility] - Client Client Site [facility] Day Physician Charlann Boxer Contact Type Call Call Type Triage / Clinical Relationship To Patient Self Appointment Disposition EMR Patient Reports Appointment Already Scheduled Return Phone Number [phone] (Primary) Chief Complaint Leg Pain Initial Comment Caller states she has appt later, wants to know what to do for leg pain till then PreDisposition Did not know what to do Info pasted into Epic Yes Nurse Assessment Nurse: Mechele Dawley, RN, Amy Date/Time (Eastern Time): 05/31/2014 10:05:33 AM Confirm and document reason for call. If symptomatic, describe symptoms. ---CALLER STATES THAT SHE HAS AN APPT WITH DR. Tamala Julian ON THE 15TH. SHE STATES THE PAIN IS IN THE RIGHT THIGH AREA IN THE FRONT. SHE STATES THAT IT IS WRAPPING AROUND THE KNEE. SHE STATES SHE HAS BEEN HAVING PAIN IN THE LEG FOR ABOUT 6 MONTHS. SHE STATES IT GOES AWAY AND THEN IT WILL COME BACK. 2/10 ON THE PAIN SCALE. SHE STATES THAT IS COULD BE STRESS SHE IS NOT SURE. NO INJURY TO THE LEG. SHE STATES IT STARTED OUT AS PAIN IN THE BUTTOCK REGION. SHE STATES NOW IT IS THE LEG THAT IS HURTING. IT STOPS ABOVE THE KNEE AND AROUND THE KNEE AS WELL. SHE IS WORRIED ABOUT THIS . Has the patient traveled out of the country within the last 30 days? ---Not Applicable Does the patient require triage? ---Yes Related visit to physician within the last 2 weeks? ---No Does the PT have any chronic conditions? (i.e. diabetes, asthma, etc.) ---Yes List chronic conditions. ---HYPERTENSION, THYROID Guidelines Guideline Title Affirmed Question Affirmed Notes Nurse  Date/Time Eilene Ghazi Time) Leg Pain [1] MODERATE pain (e.g., interferes with normal activities, [place], Therapist, sports, [place] 05/31/2014 10:06:20 AM PLEASE NOTE: All timestamps contained within this report are represented as [place] Standard Time. CONFIDENTIALTY NOTICE: This fax transmission is intended only for the addressee. It contains information that is legally privileged, confidential or otherwise protected from use or disclosure. If you are not the intended recipient, you are strictly prohibited from reviewing, disclosing, copying using or disseminating any of this information or taking any action in reliance on or regarding this information. If you have received this fax in error, please notify us immediately by telephone so that we can arrange for its return to [place]. Phone: [phone], Toll-Free: [phone], Fax: [phone] Page: 2 of 2 Call Id: [id number] Guidelines Guideline Title Affirmed Question Affirmed Notes Nurse Date/Time Eilene Ghazi Time) limping) AND [2] present > 3 days Disp. Time Eilene Ghazi Time) Disposition Final User 05/31/2014 10:10:14 AM See PCP When Office is Open (within 3 days) Yes [place], Therapist, sports, Amy

## 2014-06-11 ENCOUNTER — Ambulatory Visit (INDEPENDENT_AMBULATORY_CARE_PROVIDER_SITE_OTHER)
Admission: RE | Admit: 2014-06-11 | Discharge: 2014-06-11 | Disposition: A | Discharge status: Home / Self Care | Payer: Medicare Other | Source: Ambulatory Visit | Attending: Family Medicine | Admitting: Family Medicine

## 2014-06-11 ENCOUNTER — Encounter: Payer: Self-pay | Admitting: Family Medicine

## 2014-06-11 ENCOUNTER — Ambulatory Visit (INDEPENDENT_AMBULATORY_CARE_PROVIDER_SITE_OTHER): Payer: Medicare Other | Admitting: Family Medicine

## 2014-06-11 VITALS — BP 108/72 | HR 64 | Ht 65.0 in | Wt 127.0 lb

## 2014-06-11 DIAGNOSIS — G5701 Lesion of sciatic nerve, right lower limb: Secondary | ICD-10-CM

## 2014-06-11 DIAGNOSIS — M549 Dorsalgia, unspecified: Secondary | ICD-10-CM

## 2014-06-11 NOTE — Assessment & Plan Note (Signed)
Piriformis Syndrome  Using an anatomical model, reviewed with the patient the structures involved and how they related to diagnosis. The patient indicated understanding.   The patient was given a handout from Dr. Arne Cleveland book "The Sports Medicine Patient Advisor" describing the anatomy and rehabilitation of the following condition: Piriformis Syndrome  Also given a handout with more extensive Piriformis stretching, hip flexor and abductor strengthening, ham stretching  Rec deep massage, explained self-massage with ball Return to clinic in 3 weeks for further evaluation. If continuing to have discomfort we need to consider looking for more of a lumbar radiculopathy.

## 2014-06-11 NOTE — Progress Notes (Signed)
Pre visit review using our clinic review tool, if applicable. No additional management support is needed unless otherwise documented below in the visit note. 

## 2014-06-11 NOTE — Progress Notes (Signed)
  [facility] [place]vette Short, Yvette Short [address] Phone: [phone] Subjective:    I'm seeing this patient by the request  of:  Chancy Hurter, MD   CC: Right thigh pain  [id number] MADY OUBRE is a 69 y.o. female coming in with complaint of right thigh pain. Patient states that the pain seemed to be wrapping around her knee. Has been going on for approximally 10 days. Patient states it is more of a dull aching pain that seems to be in the right buttocks area and seems to radiate down her leg. Denies any significant back pain that is associated with it. Denies any fevers chills or any abnormal weight loss. Patient states this seems to radiate towards her right thigh and can wrap around to the anterior aspect. Patient states that he can be very uncomfortable to sit for long amount of time. Patient also has some dull throbbing aching pain when getting up from a seated position for some time it takes minutes to resolve. Patient states this is not seeming to respond to over-the-counter natural supplementations. Denies though that is waking her up at night. Rates the severity of 6 out of 10.     Past medical history, social, surgical and family history all reviewed in electronic medical record.   Review of Systems: No headache, visual changes, nausea, vomiting, diarrhea, constipation, dizziness, abdominal pain, skin rash, fevers, chills, night sweats, weight loss, swollen lymph nodes, body aches, joint swelling, muscle aches, chest pain, shortness of breath, mood changes.   Objective Blood pressure 108/72, pulse 64, height 5\' 5"  (1.651 m), weight 127 lb (57.607 kg), SpO2 97 %.  General: No apparent distress alert and oriented x3 mood and affect normal, dressed appropriately.  HEENT: Pupils equal, extraocular movements intact  Respiratory: Patient's speak in full sentences and does not appear short of breath  Cardiovascular: No lower extremity  edema, non tender, no erythema  Skin: Warm dry intact with no signs of infection or rash on extremities or on axial skeleton.  Abdomen: Soft nontender  Neuro: Cranial nerves II through XII are intact, neurovascularly intact in all extremities with 2+ DTRs and 2+ pulses.  Lymph: No lymphadenopathy of posterior or anterior cervical chain or axillae bilaterally.  Gait normal with good balance and coordination.  MSK:  Non tender with full range of motion and good stability and symmetric strength and tone of shoulders, elbows, wrist, knee and ankles bilaterally.  Back Exam:  Inspection: Unremarkable  Motion: Flexion 45 deg, Extension 45 deg, Side Bending to 45 deg bilaterally,  Rotation to 45 deg bilaterally  SLR laying: Mild positive right leg XSLR laying: Negative  Palpable tenderness: Tender over piriformis muscle. FABER: Positive right Sensory change: Gross sensation intact to all lumbar and sacral dermatomes.  Reflexes: 2+ at both patellar tendons, 2+ at achilles tendons, Babinski's downgoing.  Strength at foot  Plantar-flexion: 5/5 Dorsi-flexion: 5/5 Eversion: 5/5 Inversion: 5/5  Leg strength  Quad: 5/5 Hamstring: 5/5 Hip flexor: 5/5 Hip abductors: 5/5  Gait unremarkable.  Procedure note 76226; 15 minutes spent for Therapeutic exercises as stated in above notes.  This included exercises focusing on stretching, strengthening, with significant focus on eccentric aspects.   Proper technique shown and discussed handout in great detail with ATC.  All questions were discussed and answered.      Impression and Recommendations:     This case required medical decision making of moderate complexity.

## 2014-06-11 NOTE — Patient Instructions (Addendum)
Very nice to meet you Ice 20 minutes 2 times daily. Usually after activity and before bed. Exercises 3 times a week.  Vitamin D 2000 IU daily Turmeric 500mg  twice daily Tennis ball in back right pocket with sitting Xrays to make sure we are not missing anything See me again in 3-4 weeks and if not better we can try injection if you bring someone with you

## 2014-06-27 ENCOUNTER — Encounter: Payer: Self-pay | Admitting: Family Medicine

## 2014-06-27 ENCOUNTER — Ambulatory Visit (INDEPENDENT_AMBULATORY_CARE_PROVIDER_SITE_OTHER): Payer: Medicare Other | Admitting: Family Medicine

## 2014-06-27 VITALS — BP 132/84 | HR 65 | Ht 65.0 in | Wt 127.0 lb

## 2014-06-27 DIAGNOSIS — G5701 Lesion of sciatic nerve, right lower limb: Secondary | ICD-10-CM | POA: Diagnosis not present

## 2014-06-27 MED ORDER — PREDNISONE 50 MG PO TABS: 50.0000 mg | ORAL_TABLET | Freq: Every day | ORAL | Status: DC

## 2014-06-27 MED ORDER — GABAPENTIN 100 MG PO CAPS: 200.0000 mg | ORAL_CAPSULE | Freq: Every day | ORAL | Status: DC

## 2014-06-27 NOTE — Progress Notes (Signed)
Pre visit review using our clinic review tool, if applicable. No additional management support is needed unless otherwise documented below in the visit note. 

## 2014-06-27 NOTE — Patient Instructions (Addendum)
Good to see you Continue the ice Tennis ball when sitting long time Prednisone 50 mg daily for 5 days Gabapentin at night 100mg  for first week, then try 200mg  New exercises 3 times a week.  See me again in 2-3 weeks.

## 2014-06-27 NOTE — Progress Notes (Signed)
  [name] [place], [place] [address] Phone: [phone] Subjective:    CC: Right thigh pain follow-up  [id number] Yvette Short is a 69 y.o. female coming in with complaint of right thigh pain. Patient sent have more of a piriformis syndrome. Patient was having a significant amount of pain but was given as well as we discussed topical anti-inflammatories.   States 50% better. Patient states that some of the pain in the buttocks area is better. Patient states that the back pain that seems to be radiating towards the leg. Patient states now it seems to be going down the anterior lateral aspect of the leg stopping at the knee. Seems very specific more than previously. Patient states that this is still intermittent and not stopping her from daily activities but sometimes feel like she has to sit more because her right leg may not be as strong.  Patient at last exam also and mild facet hypertrophy of the L3 area inferiorly bilaterally. Otherwise unremarkable.     Past medical history, social, surgical and family history all reviewed in electronic medical record.   Review of Systems: No headache, visual changes, nausea, vomiting, diarrhea, constipation, dizziness, abdominal pain, skin rash, fevers, chills, night sweats, weight loss, swollen lymph nodes, body aches, joint swelling, muscle aches, chest pain, shortness of breath, mood changes.   Objective Blood pressure 132/84, pulse 65, height 5\' 5"  (1.651 m), weight 127 lb (57.607 kg), SpO2 97 %.  General: No apparent distress alert and oriented x3 mood and affect normal, dressed appropriately.  HEENT: Pupils equal, extraocular movements intact  Respiratory: Patient's speak in full sentences and does not appear short of breath  Cardiovascular: No lower extremity edema, non tender, no erythema  Skin: Warm dry intact with no signs of infection or rash on extremities or on axial skeleton.    Abdomen: Soft nontender  Neuro: Cranial nerves II through XII are intact, neurovascularly intact in all extremities with 2+ DTRs and 2+ pulses.  Lymph: No lymphadenopathy of posterior or anterior cervical chain or axillae bilaterally.  Gait normal with good balance and coordination.  MSK:  Non tender with full range of motion and good stability and symmetric strength and tone of shoulders, elbows, wrist, knee and ankles bilaterally.  Back Exam:  Inspection: Unremarkable  Motion: Flexion 45 deg, Extension 45 deg, Side Bending to 45 deg bilaterally,  Rotation to 45 deg bilaterally  SLR laying: Mild positive right leg even more than previous exam XSLR laying: Negative  Palpable tenderness: Tender over piriformis muscle. FABER: Positive right Sensory change: Gross sensation intact to all lumbar and sacral dermatomes.  Reflexes: 2+ at both patellar tendons, 2+ at achilles tendons, Babinski's downgoing.  Strength at foot  Plantar-flexion: 5/5 Dorsi-flexion: 5/5 Eversion: 5/5 Inversion: 5/5  Leg strength  Quad: 5/5 Hamstring: 5/5 Hip flexor: 5/5 Hip abductors: 4/5  Gait unremarkable.      Impression and Recommendations:     This case required medical decision making of moderate complexity.

## 2014-06-27 NOTE — Assessment & Plan Note (Signed)
We will continue to treat for piriformis syndrome.  Concern though for lumbar radiculopathy.  Patient does not have any weakness and a positive straight leg test was worse. We are not treating with prednisone as well as the possibility of gabapentin to see this beneficial. Patient given other exercises for more of a core strengthening technique today. Patient come back and see me again in 2 weeks. If any worsening symptoms further advanced imaging may be necessary. We also discussed the possibility of injections for diagnostic and therapeutic purposes only

## 2014-07-24 ENCOUNTER — Encounter: Payer: Self-pay | Admitting: Family Medicine

## 2014-07-24 ENCOUNTER — Ambulatory Visit (INDEPENDENT_AMBULATORY_CARE_PROVIDER_SITE_OTHER)
Admission: RE | Admit: 2014-07-24 | Discharge: 2014-07-24 | Disposition: A | Discharge status: Home / Self Care | Payer: Medicare Other | Source: Ambulatory Visit | Attending: Family Medicine | Admitting: Family Medicine

## 2014-07-24 ENCOUNTER — Ambulatory Visit (INDEPENDENT_AMBULATORY_CARE_PROVIDER_SITE_OTHER): Payer: Medicare Other | Admitting: Family Medicine

## 2014-07-24 VITALS — BP 140/90 | HR 73 | Ht 65.0 in | Wt 128.0 lb

## 2014-07-24 DIAGNOSIS — M25551 Pain in right hip: Secondary | ICD-10-CM

## 2014-07-24 DIAGNOSIS — M5416 Radiculopathy, lumbar region: Secondary | ICD-10-CM | POA: Diagnosis not present

## 2014-07-24 HISTORY — DX: Radiculopathy, lumbar region: M54.16

## 2014-07-24 NOTE — Assessment & Plan Note (Signed)
Patient's symptoms of the piriformis as well as the sacroiliac joint.  Patient continues to have the radicular type symptoms that seems to be more of the L3-L4 nerve root impingement. This is too high for any piriformis syndrome that could be causing it. Patient will increase her gabapentin continue with formal physical therapy. Sure there is no obturator nerve impingement with a x-ray of the hip to make sure there is no underlying arthritis that could be contribute in. Depending on how patient reacts to this patient will call. Patient has any worsening symptoms which seems to be occurring slowly we may need to consider further imaging including an MRI of the lumbar spine for nerve root impingement at L3-L4 on the right side. Patient has no clonus Her weakness which is new. She does have some weakness of the hip flexor as well which is somewhat concerning.

## 2014-07-24 NOTE — Progress Notes (Signed)
  [name] [place], [place] [address] Phone: [phone] Subjective:    CC: Right thigh pain follow-up  PPI:[id number] Yvette Short is a 69 y.o. female coming in with complaint of right thigh pain. Patient sent have more of a piriformis syndrome. Patient was having a significant amount of pain but was given as well as we discussed topical anti-inflammatories.   Patient has been doing relatively well and is going to formal physical therapy. Patient states that the back pain seems to be doing better but unfortunate she continues to have the dull throbbing ache into her anterior lateral aspect of the thigh that now can passer knee. Patient states that this is not weakness but feels that her leg feels heavy. Patient states that unfortunately this seems to be getting somewhat worse.  Patient at last exam also and mild facet hypertrophy of the L3 area inferiorly bilaterally. Otherwise unremarkable.     Past medical history, social, surgical and family history all reviewed in electronic medical record.   Review of Systems: No headache, visual changes, nausea, vomiting, diarrhea, constipation, dizziness, abdominal pain, skin rash, fevers, chills, night sweats, weight loss, swollen lymph nodes, body aches, joint swelling, muscle aches, chest pain, shortness of breath, mood changes.   Objective Blood pressure 140/90, pulse 73, height 5\' 5"  (1.651 m), weight 128 lb (58.06 kg), SpO2 98 %.  General: No apparent distress alert and oriented x3 mood and affect normal, dressed appropriately.  HEENT: Pupils equal, extraocular movements intact  Respiratory: Patient's speak in full sentences and does not appear short of breath  Cardiovascular: No lower extremity edema, non tender, no erythema  Skin: Warm dry intact with no signs of infection or rash on extremities or on axial skeleton.  Abdomen: Soft nontender  Neuro: Cranial nerves II through XII are intact,  neurovascularly intact in all extremities with 2+ DTRs and 2+ pulses.  Lymph: No lymphadenopathy of posterior or anterior cervical chain or axillae bilaterally.  Gait normal with good balance and coordination.  MSK:  Non tender with full range of motion and good stability and symmetric strength and tone of shoulders, elbows, wrist, knee and ankles bilaterally.  Back Exam:  Inspection: Unremarkable and is moderately numb over the anterior aspect of the right quadriceps Motion: Flexion 45 deg, Extension 45 deg, Side Bending to 45 deg bilaterally,  Rotation to 45 deg bilaterally  SLR laying: Mild positive right leg even more than previous exam, continues to seem to worsen XSLR laying: Negative  Palpable tenderness: Nontender FABER: Positive right Sensory change: Gross sensation intact to all lumbar and sacral dermatomes.  Reflexes: 2+ at both patellar tendons, 2+ at achilles tendons, Babinski's downgoing.  Strength at foot  Plantar-flexion: 5/5 Dorsi-flexion: 5/5 Eversion: 5/5 Inversion: 5/5  Leg strength  Quad: 5/5 Hamstring: 5/5 Hip flexor: 4/5 compared to 5 out of 5 on the contralateral side. Hip abductors: 4/5  Gait unremarkable.      Impression and Recommendations:     This case required medical decision making of moderate complexity.

## 2014-07-24 NOTE — Patient Instructions (Signed)
Good to see you Ice is your friend still  Increase gabapentin to 200mg  at night Go bed shopping.  Right hip xray Continue everything else  Write me me in 1-2 weeks and if not better we will need to consider MRI.

## 2014-07-24 NOTE — Progress Notes (Signed)
Pre visit review using our clinic review tool, if applicable. No additional management support is needed unless otherwise documented below in the visit note. 

## 2014-08-05 ENCOUNTER — Encounter: Payer: Self-pay | Admitting: Family Medicine

## 2014-08-05 DIAGNOSIS — M5416 Radiculopathy, lumbar region: Secondary | ICD-10-CM

## 2014-08-21 ENCOUNTER — Encounter: Payer: Self-pay | Admitting: Family Medicine

## 2014-08-21 MED ORDER — DIAZEPAM 5 MG PO TABS: ORAL_TABLET | ORAL | Status: DC

## 2014-08-27 ENCOUNTER — Ambulatory Visit
Admission: RE | Admit: 2014-08-27 | Discharge: 2014-08-27 | Disposition: A | Discharge status: Home / Self Care | Payer: PRIVATE HEALTH INSURANCE | Source: Ambulatory Visit | Attending: Family Medicine | Admitting: Family Medicine

## 2014-08-27 DIAGNOSIS — M5416 Radiculopathy, lumbar region: Secondary | ICD-10-CM

## 2014-09-02 ENCOUNTER — Encounter: Payer: Self-pay | Admitting: Family Medicine

## 2014-09-19 ENCOUNTER — Ambulatory Visit (INDEPENDENT_AMBULATORY_CARE_PROVIDER_SITE_OTHER): Payer: Medicare Other | Admitting: Family Medicine

## 2014-09-19 ENCOUNTER — Encounter: Payer: Self-pay | Admitting: Family Medicine

## 2014-09-19 VITALS — BP 128/80 | HR 70 | Ht 65.0 in | Wt 128.0 lb

## 2014-09-19 DIAGNOSIS — M4806 Spinal stenosis, lumbar region: Secondary | ICD-10-CM | POA: Diagnosis not present

## 2014-09-19 DIAGNOSIS — M5416 Radiculopathy, lumbar region: Secondary | ICD-10-CM

## 2014-09-19 DIAGNOSIS — M48061 Spinal stenosis, lumbar region without neurogenic claudication: Secondary | ICD-10-CM

## 2014-09-19 NOTE — Assessment & Plan Note (Signed)
Patient continues to have the same pain. We discussed icing and home exercises. Patient will have an epidural Vertell Limber injection. This was ordered today. Patient come back 2 weeks after the epidural to discuss further.

## 2014-09-19 NOTE — Progress Notes (Signed)
Pre visit review using our clinic review tool, if applicable. No additional management support is needed unless otherwise documented below in the visit note. 

## 2014-09-19 NOTE — Patient Instructions (Signed)
Good to see you Ice is your friend New exercises for your shoulder Epidural for your back See me again 2 weeks after epidural.

## 2014-09-19 NOTE — Progress Notes (Signed)
  [name] [place], [place] [address] Phone: [phone] Subjective:    CC: Right thigh pain follow-up  [id number] Yvette Short is a 69 y.o. female coming in with complaint of right thigh pain. Patient sent have more of a piriformis syndrome. [name] followed up seem to have more of a lumbar radiculopathy. Patient continued to have some symptoms and patient did have advanced imaging. Patient's advance imaging did show spinal stenosis at L4-L5.  Patient states that she continues to still have the same pain. Patient states that no significant improvement since last time. Patient states overall she continues to have discomfort that does stop her from daily activities. Sometimes can be very difficult to follow sleep at night. He is taking gabapentin 200 mg.     Past medical history, social, surgical and family history all reviewed in electronic medical record.   Review of Systems: No headache, visual changes, nausea, vomiting, diarrhea, constipation, dizziness, abdominal pain, skin rash, fevers, chills, night sweats, weight loss, swollen lymph nodes, body aches, joint swelling, muscle aches, chest pain, shortness of breath, mood changes.   Objective Blood pressure 128/80, pulse 70, height 5\' 5"  (1.651 m), weight 128 lb (58.06 kg), SpO2 97 %.  General: No apparent distress alert and oriented x3 mood and affect normal, dressed appropriately.  HEENT: Pupils equal, extraocular movements intact  Respiratory: Patient's speak in full sentences and does not appear short of breath  Cardiovascular: No lower extremity edema, non tender, no erythema  Skin: Warm dry intact with no signs of infection or rash on extremities or on axial skeleton.  Abdomen: Soft nontender  Neuro: Cranial nerves II through XII are intact, neurovascularly intact in all extremities with 2+ DTRs and 2+ pulses.  Lymph: No lymphadenopathy of posterior or anterior cervical  chain or axillae bilaterally.  Gait normal with good balance and coordination.  MSK:  Non tender with full range of motion and good stability and symmetric strength and tone of shoulders, elbows, wrist, knee and ankles bilaterally.  Back Exam:  Inspection:  Motion: Flexion 45 deg, Extension 45 deg, Side Bending to 45 deg bilaterally,  Rotation to 45 deg bilaterally  SLR laying: Continue with mild positive right side XSLR laying: Negative  Palpable tenderness: Nontender FABER: Positive right Sensory change: Gross sensation intact to all lumbar and sacral dermatomes.  Reflexes: 2+ at both patellar tendons, 2+ at achilles tendons, Babinski's downgoing.  Strength at foot  Plantar-flexion: 5/5 Dorsi-flexion: 5/5 Eversion: 5/5 Inversion: 5/5  Leg strength  Quad: 5/5 Hamstring: 5/5 Hip flexor: 4/5 compared to 5 out of 5 on the contralateral side. Hip abductors: 4/5  Gait unremarkable.      Impression and Recommendations:     This case required medical decision making of moderate complexity.

## 2014-10-02 ENCOUNTER — Ambulatory Visit
Admission: RE | Admit: 2014-10-02 | Discharge: 2014-10-02 | Disposition: A | Discharge status: Home / Self Care | Payer: Medicare Other | Source: Ambulatory Visit | Attending: Family Medicine | Admitting: Family Medicine

## 2014-10-02 DIAGNOSIS — M48061 Spinal stenosis, lumbar region without neurogenic claudication: Secondary | ICD-10-CM

## 2014-10-02 MED ORDER — IOHEXOL 180 MG/ML  SOLN
1.0000 mL | Freq: Once | INTRAMUSCULAR | Status: AC | PRN
  Administered 2014-10-02: 1 mL via EPIDURAL

## 2014-10-02 MED ORDER — METHYLPREDNISOLONE ACETATE 40 MG/ML INJ SUSP (RADIOLOG
120.0000 mg | Freq: Once | INTRAMUSCULAR | Status: AC
  Administered 2014-10-02: 120 mg via EPIDURAL

## 2014-10-02 NOTE — Discharge Instructions (Signed)

## 2014-10-16 ENCOUNTER — Ambulatory Visit (INDEPENDENT_AMBULATORY_CARE_PROVIDER_SITE_OTHER): Payer: Medicare Other | Admitting: Family Medicine

## 2014-10-16 ENCOUNTER — Encounter: Payer: Self-pay | Admitting: Family Medicine

## 2014-10-16 VITALS — BP 118/76 | HR 55 | Wt 127.0 lb

## 2014-10-16 DIAGNOSIS — M48062 Spinal stenosis, lumbar region with neurogenic claudication: Secondary | ICD-10-CM

## 2014-10-16 DIAGNOSIS — M4806 Spinal stenosis, lumbar region: Secondary | ICD-10-CM

## 2014-10-16 HISTORY — DX: Spinal stenosis, lumbar region with neurogenic claudication: M48.062

## 2014-10-16 NOTE — Progress Notes (Signed)
  [facility] [place], [place] [address] Phone: [phone] Subjective:    CC: Right thigh pain follow-up  [id number] CHALESE Short is a 69 y.o. female coming in with complaint of right thigh pain. Patient sent have more of a piriformis syndrome. [name] followed up seem to have more of a lumbar radiculopathy. Patient continued to have some symptoms and patient did have advanced imaging. Patient's advance imaging did show spinal stenosis at L4-L5. Patient elected to have an epidural steroidal injection and states that the pain is significantly improved. Very mild discomfort as well as still some mild numbness on the lateral aspect the leg. Nothing that is stopping her from activities and denies any weakness. Patient started walking already. Patient continues with the gabapentin at night     Past medical history, social, surgical and family history all reviewed in electronic medical record.   Review of Systems: No headache, visual changes, nausea, vomiting, diarrhea, constipation, dizziness, abdominal pain, skin rash, fevers, chills, night sweats, weight loss, swollen lymph nodes, body aches, joint swelling, muscle aches, chest pain, shortness of breath, mood changes.   Objective Blood pressure 118/76, pulse 55, weight 127 lb (57.607 kg), SpO2 98 %.  General: No apparent distress alert and oriented x3 mood and affect normal, dressed appropriately.  HEENT: Pupils equal, extraocular movements intact  Respiratory: Patient's speak in full sentences and does not appear short of breath  Cardiovascular: No lower extremity edema, non tender, no erythema  Skin: Warm dry intact with no signs of infection or rash on extremities or on axial skeleton.  Abdomen: Soft nontender  Neuro: Cranial nerves II through XII are intact, neurovascularly intact in all extremities with 2+ DTRs and 2+ pulses.  Lymph: No lymphadenopathy of posterior or anterior  cervical chain or axillae bilaterally.  Gait normal with good balance and coordination.  MSK:  Non tender with full range of motion and good stability and symmetric strength and tone of shoulders, elbows, wrist, knee and ankles bilaterally.  Back Exam:  Inspection:  Motion: Flexion 45 deg, Extension 45 deg, Side Bending to 45 deg bilaterally,  Rotation to 45 deg bilaterally  SLR laying: Negative XSLR laying: Negative  Palpable tenderness: Nontender FABER: Negative Sensory change: Gross sensation intact to all lumbar and sacral dermatomes.  Reflexes: 2+ at both patellar tendons, 2+ at achilles tendons, Babinski's downgoing.  Strength at foot  Plantar-flexion: 5/5 Dorsi-flexion: 5/5 Eversion: 5/5 Inversion: 5/5  Leg strength  Quad: 5/5 Hamstring: 5/5 Hip flexor: 4/5 compared to 5 out of 5 on the contralateral side. Hip abductors: 4/5  Gait unremarkable.      Impression and Recommendations:     This case required medical decision making of moderate complexity.

## 2014-10-16 NOTE — Patient Instructions (Signed)
Good to see you Continue the exercises at least 3 times a week Decrease gabapentin to 100mg  for 2 weeks, if still no pain then 3 times a week for 2 weks and then discontinue if you want.  OK otherwise to stay at 200mg  as well Stay active Call if you need another epidural See me when you need me.

## 2014-10-16 NOTE — Assessment & Plan Note (Signed)
She has responded very well to the epidural. Patient is having significantly decreased symptoms at this time. Encourage her to start increasing activity we discussed proper shoes in which activities would be beneficial. Patient will do more of a titration off the gabapentin but if pain continues she can continue with 200 mg at night. Patient otherwise can follow-up as needed. We discussed epidurals can be done #3 and a 12 month. If necessary. Patient will call if any other symptoms occur.

## 2014-11-15 ENCOUNTER — Other Ambulatory Visit: Payer: Self-pay | Admitting: *Deleted

## 2014-11-15 MED ORDER — GABAPENTIN 100 MG PO CAPS: 200.0000 mg | ORAL_CAPSULE | Freq: Every day | ORAL | Status: DC

## 2014-11-15 NOTE — Telephone Encounter (Signed)
Refill done.  

## 2015-03-12 ENCOUNTER — Telehealth: Payer: Self-pay | Admitting: Family Medicine

## 2015-03-12 DIAGNOSIS — M48062 Spinal stenosis, lumbar region with neurogenic claudication: Secondary | ICD-10-CM

## 2015-03-12 NOTE — Telephone Encounter (Signed)
Needs to get another injection.  Would like order to be placed.

## 2015-03-12 NOTE — Telephone Encounter (Signed)
Order entered. Pt made aware.  

## 2015-03-17 ENCOUNTER — Other Ambulatory Visit: Payer: Self-pay

## 2015-03-17 DIAGNOSIS — Z1231 Encounter for screening mammogram for malignant neoplasm of breast: Secondary | ICD-10-CM

## 2015-03-26 ENCOUNTER — Ambulatory Visit
Admission: RE | Admit: 2015-03-26 | Discharge: 2015-03-26 | Disposition: A | Discharge status: Home / Self Care | Payer: Medicare Other | Source: Ambulatory Visit | Attending: Family Medicine | Admitting: Family Medicine

## 2015-03-26 DIAGNOSIS — M48062 Spinal stenosis, lumbar region with neurogenic claudication: Secondary | ICD-10-CM

## 2015-03-26 MED ORDER — IOHEXOL 180 MG/ML  SOLN
1.0000 mL | Freq: Once | INTRAMUSCULAR | Status: AC | PRN
  Administered 2015-03-26: 1 mL via EPIDURAL

## 2015-03-26 MED ORDER — METHYLPREDNISOLONE ACETATE 40 MG/ML INJ SUSP (RADIOLOG
120.0000 mg | Freq: Once | INTRAMUSCULAR | Status: AC
  Administered 2015-03-26: 120 mg via EPIDURAL

## 2015-03-26 NOTE — Discharge Instructions (Signed)

## 2015-04-03 ENCOUNTER — Ambulatory Visit
Admission: RE | Admit: 2015-04-03 | Discharge: 2015-04-03 | Disposition: A | Discharge status: Home / Self Care | Payer: Medicare Other | Source: Ambulatory Visit

## 2015-04-03 DIAGNOSIS — Z1231 Encounter for screening mammogram for malignant neoplasm of breast: Secondary | ICD-10-CM

## 2015-04-09 ENCOUNTER — Ambulatory Visit (INDEPENDENT_AMBULATORY_CARE_PROVIDER_SITE_OTHER): Payer: Medicare Other | Admitting: Family Medicine

## 2015-04-09 ENCOUNTER — Encounter: Payer: Self-pay | Admitting: Family Medicine

## 2015-04-09 VITALS — BP 112/78 | HR 66 | Ht 65.0 in | Wt 123.0 lb

## 2015-04-09 DIAGNOSIS — M4806 Spinal stenosis, lumbar region: Secondary | ICD-10-CM | POA: Diagnosis not present

## 2015-04-09 DIAGNOSIS — M48062 Spinal stenosis, lumbar region with neurogenic claudication: Secondary | ICD-10-CM

## 2015-04-09 MED ORDER — GABAPENTIN 300 MG PO CAPS: ORAL_CAPSULE | ORAL | Status: DC

## 2015-04-09 NOTE — Progress Notes (Signed)
[name] [place], [place] [address] Phone: [phone] Subjective:    CC: Right thigh pain follow-up  [id number] Yvette Short is a 70 y.o. female coming in with complaint of right thigh pain. Patient sent have more of a piriformis syndrome. [name] followed up seem to have more of a lumbar radiculopathy. Patient continued to have some symptoms and patient did have advanced imaging. Patient's advance imaging did show spinal stenosis at L4-L5. Patient elected to have an epidural steroidal injection and states that the pain is significantly improved.  Had worsening symptoms and did have repeat epidural. Last was 03/26/2015. Has had 2 injections over the course last 6 months. States that she is 70% better. Is making progress. Increasing activity. Still some mild radicular symptoms. No side effects to the gabapentin. Seems like these are helpful for the nighttime pain that she's been having. No other fevers chills or any abnormal weight loss     Past Medical History  Diagnosis Date  . HYPERLIPIDEMIA 11/09/2006  . ANXIETY 07/06/2007  . GERD 11/09/2006  . Migraine   . Hypertension   . Diverticulosis    Past Surgical History  Procedure Laterality Date  . Lipoma excision      of back  . Tubal ligation     Social History  Substance Use Topics  . Smoking status: Former Smoker -- 2.50 packs/day for 130 years    Types: Cigarettes    Quit date: 07/28/1974  . Smokeless tobacco: Never Used  . Alcohol Use: 1.2 oz/week    2 Cans of beer per week   Allergies  Allergen Reactions  . Tetracycline Hcl Rash        Family History  Problem Relation Age of Onset  . Dementia Mother   . Colon cancer Father 7  . Cancer Father 74    colon cancer    Past medical history, social, surgical and family history all reviewed in electronic medical record.   Review of Systems: No headache, visual changes, nausea, vomiting, diarrhea, constipation,  dizziness, abdominal pain, skin rash, fevers, chills, night sweats, weight loss, swollen lymph nodes, body aches, joint swelling, muscle aches, chest pain, shortness of breath, mood changes.   Objective Blood pressure 112/78, pulse 66, height 5\' 5"  (1.651 m), weight 123 lb (55.792 kg), SpO2 97 %.  General: No apparent distress alert and oriented x3 mood and affect normal, dressed appropriately.  HEENT: Pupils equal, extraocular movements intact  Respiratory: Patient's speak in full sentences and does not appear short of breath  Cardiovascular: No lower extremity edema, non tender, no erythema  Skin: Warm dry intact with no signs of infection or rash on extremities or on axial skeleton.  Abdomen: Soft nontender  Neuro: Cranial nerves II through XII are intact, neurovascularly intact in all extremities with 2+ DTRs and 2+ pulses.  Lymph: No lymphadenopathy of posterior or anterior cervical chain or axillae bilaterally.  Gait normal with good balance and coordination.  MSK:  Non tender with full range of motion and good stability and symmetric strength and tone of shoulders, elbows, wrist, knee and ankles bilaterally.  Back Exam:  Inspection:  Motion: Flexion 45 deg, Extension 45 deg, Side Bending to 45 deg bilaterally,  Rotation to 45 deg bilaterally  SLR laying: Negative at this moment XSLR laying: Negative  Palpable tenderness: Nontender FABER: Negative Sensory change: Gross sensation intact to all lumbar and sacral dermatomes.  Reflexes: 2+ at both patellar tendons, 2+ at  achilles tendons, Babinski's downgoing.  Strength at foot  Plantar-flexion: 5/5 Dorsi-flexion: 5/5 Eversion: 5/5 Inversion: 5/5  Leg strength  Quad: 5/5 Hamstring: 5/5 Hip flexor: 4/5 compared to 5 out of 5 on the contralateral side. Hip abductors: 4/5  Gait unremarkable. No significant change from previous exam.      Impression and Recommendations:     This case required medical decision making of moderate  complexity.

## 2015-04-09 NOTE — Assessment & Plan Note (Signed)
Patient is overall doing relatively well. Patient continues to have some radicular symptoms. We discussed the possibility of a neurosurgery's consult which patient declined. Patient given a new prescription for increasing gabapentin with patient not having as much improvement with the radicular symptoms. Patient has any increasing weakness are constant numbness she will come back for further evaluation. I will like to see her again every 6-8 weeks to make sure that she continues to improve. We discussed formal physical therapy which patient declined.

## 2015-04-09 NOTE — Patient Instructions (Signed)
Good to see you I am glad we are doing better Increase gabapentin to 300mg  at night, sent in new prescription Lets indirectly increase iron by taking vitamin C 500mg  daily Also consider [employer] protein and have it 1-2 times a day to increase your protein.  See me again in 4 weeks or at least send me a message If worsening pain then we can always repeat the epidural.

## 2015-04-09 NOTE — Progress Notes (Signed)
Pre visit review using our clinic review tool, if applicable. No additional management support is needed unless otherwise documented below in the visit note. 

## 2015-05-08 ENCOUNTER — Encounter: Payer: Self-pay | Admitting: Family Medicine

## 2015-05-08 ENCOUNTER — Other Ambulatory Visit: Payer: Self-pay | Admitting: *Deleted

## 2015-05-08 ENCOUNTER — Ambulatory Visit (INDEPENDENT_AMBULATORY_CARE_PROVIDER_SITE_OTHER): Payer: Medicare Other | Admitting: Family Medicine

## 2015-05-08 VITALS — BP 124/84 | HR 105 | Ht 65.0 in | Wt 123.0 lb

## 2015-05-08 DIAGNOSIS — M48062 Spinal stenosis, lumbar region with neurogenic claudication: Secondary | ICD-10-CM

## 2015-05-08 DIAGNOSIS — M4806 Spinal stenosis, lumbar region: Secondary | ICD-10-CM | POA: Diagnosis not present

## 2015-05-08 MED ORDER — VENLAFAXINE HCL ER 37.5 MG PO CP24: 37.5000 mg | ORAL_CAPSULE | Freq: Every day | ORAL | Status: DC

## 2015-05-08 MED ORDER — GABAPENTIN 300 MG PO CAPS: 300.0000 mg | ORAL_CAPSULE | Freq: Every day | ORAL | Status: DC

## 2015-05-08 NOTE — Assessment & Plan Note (Signed)
Discussed with patient at great length. Patient is somewhat tearful today. I do think that there is some underlying anxiety as well as potential depression that is giving her some difficulty. Patient did have a recent illness and states that this did decrease her from doing the activity and is riding a difficult to be motivated to start working out. I like to send her to formal physical therapy for further evaluation. In addition of this I would like to treat patient with Effexor. 37.5 mg. Discussed with patient that this should have no significant side effects. We did discuss what to watch out for. Patient will continue on the gabapentin. We discussed the icing regimen. Patient and will come back and see me again in 2-3 weeks for further evaluation and treatment.  Spent  25 minutes with patient face-to-face and had greater than 50% of counseling including as described above in assessment and plan.

## 2015-05-08 NOTE — Progress Notes (Signed)
[name] [place], [place] [address] Phone: [phone] Subjective:    CC: Right thigh pain follow-up  [id number] Yvette Short is a 70 y.o. female coming in with complaint of right thigh pain. Patient sent have more of a piriformis syndrome. [name] followed up seem to have more of a lumbar radiculopathy. Patient continued to have some symptoms and patient did have advanced imaging. Patient's advance imaging did show spinal stenosis at L4-L5. Patient elected to have an epidural steroidal injection and states that the pain is significantly improved.  Had worsening symptoms and did have repeat epidural. Last was 03/26/2015. Patient at last visit was doing a proximally 70% better. Patient is taking gabapentin 300 mg at night. Patient was to make some different dietary changes as well. Patient states she maybe approximate 5% better again. Patient though did have recent illness and since then she is just not felt right. Significantly decrease in energy. Patient feels like she is having more difficulty with even regular daily activities. States that it is more soreness. Takes about 20-30 minutes to get moving in the morning. Continues to have some of the radicular symptoms. Having difficulty being motivated to do the exercises on a regular basis.  Past imaging that is relevant: MRI May 2016 Advanced facet arthrosis at L4-5 resulting in grade 1 anterolisthesis and moderate spinal stenosis. 2. Mild disc and mild-to-moderate facet arthrosis elsewhere as above.    Past Medical History  Diagnosis Date  . HYPERLIPIDEMIA 11/09/2006  . ANXIETY 07/06/2007  . GERD 11/09/2006  . Migraine   . Hypertension   . Diverticulosis    Past Surgical History  Procedure Laterality Date  . Lipoma excision      of back  . Tubal ligation     Social History  Substance Use Topics  . Smoking status: Former Smoker -- 2.50 packs/day for 130 years    Types:  Cigarettes    Quit date: 07/28/1974  . Smokeless tobacco: Never Used  . Alcohol Use: 1.2 oz/week    2 Cans of beer per week   Allergies  Allergen Reactions  . Tetracycline Hcl Rash        Family History  Problem Relation Age of Onset  . Dementia Mother   . Colon cancer Father 95  . Cancer Father 76    colon cancer    Past medical history, social, surgical and family history all reviewed in electronic medical record.   Review of Systems: No headache, visual changes, nausea, vomiting, diarrhea, constipation, dizziness, abdominal pain, skin rash, fevers, chills, night sweats, weight loss, swollen lymph nodes, body aches, joint swelling, muscle aches, chest pain, shortness of breath, mood changes.   Objective Blood pressure 124/84, pulse 105, height 5\' 5"  (1.651 m), weight 123 lb (55.792 kg), SpO2 99 %.  General: No apparent distress alert and oriented x3 mood and affect normal, dressed appropriately.  HEENT: Pupils equal, extraocular movements intact  Respiratory: Patient's speak in full sentences and does not appear short of breath  Cardiovascular: No lower extremity edema, non tender, no erythema  Skin: Warm dry intact with no signs of infection or rash on extremities or on axial skeleton.  Abdomen: Soft nontender  Neuro: Cranial nerves II through XII are intact, neurovascularly intact in all extremities with 2+ DTRs and 2+ pulses.  Lymph: No lymphadenopathy of posterior or anterior cervical chain or axillae bilaterally.  Gait normal with good balance and coordination.  MSK:  Non tender  with full range of motion and good stability and symmetric strength and tone of shoulders, elbows, wrist, knee and ankles bilaterally.  Back Exam:  Inspection:  Motion: Flexion 45 deg, Extension 15 deg minorly worse than previous exam, Side Bending to 45 deg bilaterally,  Rotation to 45 deg bilaterally  SLR laying: Negative  XSLR laying: Negative  Palpable tenderness: Nontender FABER:  Negative Sensory change: Gross sensation intact to all lumbar and sacral dermatomes.  Reflexes: 2+ at both patellar tendons, 2+ at achilles tendons, Babinski's downgoing.  Strength at foot  Plantar-flexion: 5/5 Dorsi-flexion: 5/5 Eversion: 5/5 Inversion: 5/5  Leg strength  Quad: 5/5 Hamstring: 5/5 Hip flexor: 4/5 compared to 5 out of 5 on the contralateral side. Hip abductors: 4/5  Gait unremarkable.       Impression and Recommendations:     This case required medical decision making of moderate complexity.

## 2015-05-08 NOTE — Telephone Encounter (Signed)
Rx resent to pharmacy for 90-day supply.

## 2015-05-08 NOTE — Patient Instructions (Addendum)
Great to see you as always.  We will get you into PT, they should be calling you  Tylenol 500mg  3 times daily  Can take advil in addition to there tylenol if needed Continue the gabapentin at night Effexor 37.5 mg daily will help I think See me again in 2-3 weeks.

## 2015-05-08 NOTE — Progress Notes (Signed)
Pre visit review using our clinic review tool, if applicable. No additional management support is needed unless otherwise documented below in the visit note. 

## 2015-05-26 ENCOUNTER — Ambulatory Visit: Payer: Medicare Other | Admitting: Family Medicine

## 2015-06-04 ENCOUNTER — Ambulatory Visit (INDEPENDENT_AMBULATORY_CARE_PROVIDER_SITE_OTHER): Payer: Medicare Other | Admitting: Family Medicine

## 2015-06-04 ENCOUNTER — Encounter: Payer: Self-pay | Admitting: Family Medicine

## 2015-06-04 VITALS — BP 116/72 | HR 42 | Ht 65.0 in

## 2015-06-04 DIAGNOSIS — F411 Generalized anxiety disorder: Secondary | ICD-10-CM | POA: Diagnosis not present

## 2015-06-04 DIAGNOSIS — M4806 Spinal stenosis, lumbar region: Secondary | ICD-10-CM

## 2015-06-04 DIAGNOSIS — M48062 Spinal stenosis, lumbar region with neurogenic claudication: Secondary | ICD-10-CM

## 2015-06-04 NOTE — Assessment & Plan Note (Signed)
Doing better on the Effexor. No change of medication. We'll continue to monitor. Patient does have an injection plan. No suicidal or homicidal ideation.

## 2015-06-04 NOTE — Progress Notes (Signed)
Pre visit review using our clinic review tool, if applicable. No additional management support is needed unless otherwise documented below in the visit note. 

## 2015-06-04 NOTE — Progress Notes (Signed)
[name] [place],  [address] Phone: [phone] Subjective:    CC: Right thigh pain follow-up  [id number] Yvette Short is a 69 y.o. female coming in with complaint of right thigh pain. Patient has been seen and has even responded very well to an epidural steroid injection after advanced imaging. Patient continues to have some mild discomfort. He is going to formal physical therapy. Has been making some progress overall. Patient states that as long as she continues to do the exercises can continue cyst at a dull drawer. Not stopping her from different activities.  Patient also was seen previously and did have significant increase in anxiety. Patient was started on Effexor to see if this would help the anxiety as well as potentially the nerve. States that both of these have been helpful. Continues to take the gabapentin as well for the radicular symptoms. No significant side effects to the medications. Happy with the results of this at this time.  Past imaging that is relevant: MRI May 2016 Advanced facet arthrosis at L4-5 resulting in grade 1 anterolisthesis and moderate spinal stenosis. 2. Mild disc and mild-to-moderate facet arthrosis elsewhere as above.    Past Medical History  Diagnosis Date  . HYPERLIPIDEMIA 11/09/2006  . ANXIETY 07/06/2007  . GERD 11/09/2006  . Migraine   . Hypertension   . Diverticulosis    Past Surgical History  Procedure Laterality Date  . Lipoma excision      of back  . Tubal ligation     Social History  Substance Use Topics  . Smoking status: Former Smoker -- 2.50 packs/day for 130 years    Types: Cigarettes    Quit date: 07/28/1974  . Smokeless tobacco: Never Used  . Alcohol Use: 1.2 oz/week    2 Cans of beer per week   Allergies  Allergen Reactions  . Tetracycline Hcl Rash        Family History  Problem Relation Age of Onset  . Dementia Mother   . Colon cancer Father 87  .  Cancer Father 74    colon cancer    Past medical history, social, surgical and family history all reviewed in electronic medical record.   Review of Systems: No headache, visual changes, nausea, vomiting, diarrhea, constipation, dizziness, abdominal pain, skin rash, fevers, chills, night sweats, weight loss, swollen lymph nodes, body aches, joint swelling, muscle aches, chest pain, shortness of breath, mood changes.   Objective Blood pressure 116/72, pulse 42, height 5\' 5"  (1.651 m), SpO2 98 %.  General: No apparent distress alert and oriented x3 mood and affect normal, dressed appropriately. Emotionally more stable than previous exam HEENT: Pupils equal, extraocular movements intact  Respiratory: Patient's speak in full sentences and does not appear short of breath  Cardiovascular: No lower extremity edema, non tender, no erythema  Skin: Warm dry intact with no signs of infection or rash on extremities or on axial skeleton.  Abdomen: Soft nontender  Neuro: Cranial nerves II through XII are intact, neurovascularly intact in all extremities with 2+ DTRs and 2+ pulses.  Lymph: No lymphadenopathy of posterior or anterior cervical chain or axillae bilaterally.  Gait normal with good balance and coordination.  MSK:  Non tender with full range of motion and good stability and symmetric strength and tone of shoulders, elbows, wrist, knee and ankles bilaterally.  Back Exam:  Inspection:  Motion: Flexion 45 deg, Extension 20 deg minorly worse than previous exam, Side Bending to 45  deg bilaterally,  Rotation to 45 deg bilaterally  SLR laying: Negative  XSLR laying: Negative  Palpable tenderness: mild tenderness to palpation over the thigh FABER: Negative Sensory change: Gross sensation intact to all lumbar and sacral dermatomes.  Reflexes: 2+ at both patellar tendons, 2+ at achilles tendons, Babinski's downgoing.  Strength at foot  Plantar-flexion: 5/5 Dorsi-flexion: 5/5 Eversion: 5/5 Inversion:  5/5  Leg strength  Quad: 5/5 Hamstring: 5/5 Hip flexor: 4/5 compared to 5 out of 5 on the contralateral side. Hip abductors: 4/5  Gait unremarkable.       Impression and Recommendations:     This case required medical decision making of moderate complexity.

## 2015-06-04 NOTE — Patient Instructions (Signed)
Good to see you  Ice would be great and try to do after activity and before bed Continue the Effexor Gabapentin is good and we can increase if we need.  COntinue physical therapy  We can always repeat the epidural if needed See me again in 5-6 weeks.

## 2015-06-04 NOTE — Assessment & Plan Note (Signed)
Patient is doing much better at this time. We discussed icing regimen. Discuss continuing with formal physical therapy and we'll do this 2 times a week for another 4 weeks. We discussed what activities to avoid. We will make no significant changes in her medications at this time. Patient will come back and see me again in 4-6 weeks for further evaluation and treatment.

## 2015-07-02 ENCOUNTER — Other Ambulatory Visit: Payer: Self-pay | Admitting: Family Medicine

## 2015-07-02 NOTE — Telephone Encounter (Signed)
Refill done.  

## 2015-07-09 ENCOUNTER — Ambulatory Visit (INDEPENDENT_AMBULATORY_CARE_PROVIDER_SITE_OTHER): Payer: Medicare Other | Admitting: Family Medicine

## 2015-07-09 ENCOUNTER — Encounter: Payer: Self-pay | Admitting: Family Medicine

## 2015-07-09 VITALS — BP 120/80 | HR 98 | Ht 65.0 in | Wt 122.0 lb

## 2015-07-09 DIAGNOSIS — M48062 Spinal stenosis, lumbar region with neurogenic claudication: Secondary | ICD-10-CM

## 2015-07-09 DIAGNOSIS — G5701 Lesion of sciatic nerve, right lower limb: Secondary | ICD-10-CM | POA: Diagnosis not present

## 2015-07-09 DIAGNOSIS — M4806 Spinal stenosis, lumbar region: Secondary | ICD-10-CM

## 2015-07-09 DIAGNOSIS — F411 Generalized anxiety disorder: Secondary | ICD-10-CM

## 2015-07-09 MED ORDER — VENLAFAXINE HCL ER 75 MG PO CP24: 75.0000 mg | ORAL_CAPSULE | Freq: Every day | ORAL | Status: DC

## 2015-07-09 MED ORDER — GABAPENTIN 100 MG PO CAPS: 200.0000 mg | ORAL_CAPSULE | Freq: Every day | ORAL | Status: DC

## 2015-07-09 NOTE — Progress Notes (Signed)
Pre visit review using our clinic review tool, if applicable. No additional management support is needed unless otherwise documented below in the visit note. 

## 2015-07-09 NOTE — Patient Instructions (Signed)
Good to see you  Increase effexor to 75 mg daily  Decrease gabapentin to 200mg  at night Send message in 2 weeks, if not better we will consider epidural or otherwise then we will decrease gabapentin to 100mg  at night See me again in 4-6 weeks to make sure better and not losing weight.   Also can consider pirformis injection if needed.

## 2015-07-09 NOTE — Assessment & Plan Note (Signed)
Patient continues to have discomfort. We discussed about repeating the epidural which patient wants to wait on this time. We did decrease patient's gabapentin increase her Effexor. Hopefully that this will be beneficial. We discussed encourage her to continue to do the exercises. Patient will stop of formal physical therapy at this time. Follow-up again in 4 weeks.

## 2015-07-09 NOTE — Assessment & Plan Note (Signed)
Patient is doing well. We will increase the Effexor to 75 mg. This will be her top dose.warned the potential side effects

## 2015-07-09 NOTE — Assessment & Plan Note (Signed)
Discussed with patient and mother idea would be to do a injection in the piriformis. If worsening symptoms we will consider this. Patient though now she would need to bring some a for the injection.

## 2015-07-09 NOTE — Progress Notes (Signed)
[name] [address] [place], [place] [address] Phone: [phone] Subjective:    CC: Right thigh pain follow-up  [id number] Yvette Short is a 70 y.o. female coming in with complaint of right thigh pain. Patient has been seen and has even responded very well to an epidural steroid injection after advanced imaging. Patient though did not respond well to the second injection. Started on formal physical therapy. States that she has made significant improvement. States that the pain is now 2 out of 10. Still not completely gone.  Patient also was seen previously and did have significant increase in anxiety. On the Effexor. Seems to be helping someone of her leg as well. No side effects. Patient feels that the gabapentin that she is taken for her back is making her somewhat nauseated   Past imaging that is relevant: MRI May 2016 Advanced facet arthrosis at L4-5 resulting in grade 1 anterolisthesis and moderate spinal stenosis. 2. Mild disc and mild-to-moderate facet arthrosis elsewhere as above.    Past Medical History  Diagnosis Date  . HYPERLIPIDEMIA 11/09/2006  . ANXIETY 07/06/2007  . GERD 11/09/2006  . Migraine   . Hypertension   . Diverticulosis    Past Surgical History  Procedure Laterality Date  . Lipoma excision      of back  . Tubal ligation     Social History  Substance Use Topics  . Smoking status: Former Smoker -- 2.50 packs/day for 130 years    Types: Cigarettes    Quit date: 07/28/1974  . Smokeless tobacco: Never Used  . Alcohol Use: 1.2 oz/week    2 Cans of beer per week   Allergies  Allergen Reactions  . Tetracycline Hcl Rash        Family History  Problem Relation Age of Onset  . Dementia Mother   . Colon cancer Father 32  . Cancer Father 83    colon cancer    Past medical history, social, surgical and family history all reviewed in electronic medical record.   Review of Systems: No headache, visual  changes, nausea, vomiting, diarrhea, constipation, dizziness, abdominal pain, skin rash, fevers, chills, night sweats, weight loss, swollen lymph nodes, body aches, joint swelling, muscle aches, chest pain, shortness of breath, mood changes.   Objective Blood pressure 120/80, pulse 98, height 5\' 5"  (1.651 m), weight 122 lb (55.339 kg), SpO2 96 %.  General: No apparent distress alert and oriented x3 mood and affect normal, dressed appropriately. Emotionally more stable than previous exam HEENT: Pupils equal, extraocular movements intact  Respiratory: Patient's speak in full sentences and does not appear short of breath  Cardiovascular: No lower extremity edema, non tender, no erythema  Skin: Warm dry intact with no signs of infection or rash on extremities or on axial skeleton.  Abdomen: Soft nontender  Neuro: Cranial nerves II through XII are intact, neurovascularly intact in all extremities with 2+ DTRs and 2+ pulses.  Lymph: No lymphadenopathy of posterior or anterior cervical chain or axillae bilaterally.  Gait normal with good balance and coordination.  MSK:  Non tender with full range of motion and good stability and symmetric strength and tone of shoulders, elbows, wrist, knee and ankles bilaterally.  Back Exam:  Inspection:  Motion: Flexion 45 deg, Extension 20 deg Stable from previous exam Side Bending to 45 deg bilaterally,  Rotation to 45 deg bilaterally  SLR laying: Negative  XSLR laying: Negative  Palpable tenderness: mild tenderness to palpation over the  thighit may be somewhat worse than previous exam FABER: Negative Sensory change: Gross sensation intact to all lumbar and sacral dermatomes.  Reflexes: 2+ at both patellar tendons, 2+ at achilles tendons, Babinski's downgoing.  Strength at foot  Plantar-flexion: 5/5 Dorsi-flexion: 5/5 Eversion: 5/5 Inversion: 5/5  Leg strength  Quad: 5/5 Hamstring: 5/5 Hip flexor: 4/5 compared to 5 out of 5 on the contralateral side. Hip  abductors: 4/5  Gait unremarkable.       Impression and Recommendations:     This case required medical decision making of moderate complexity.

## 2015-07-23 ENCOUNTER — Encounter: Payer: Self-pay | Admitting: Family Medicine

## 2015-08-20 ENCOUNTER — Encounter: Payer: Self-pay | Admitting: Family Medicine

## 2015-08-20 ENCOUNTER — Ambulatory Visit (INDEPENDENT_AMBULATORY_CARE_PROVIDER_SITE_OTHER): Payer: Medicare Other | Admitting: Family Medicine

## 2015-08-20 VITALS — BP 120/82 | HR 76 | Ht 65.0 in | Wt 123.0 lb

## 2015-08-20 DIAGNOSIS — M48062 Spinal stenosis, lumbar region with neurogenic claudication: Secondary | ICD-10-CM

## 2015-08-20 DIAGNOSIS — F411 Generalized anxiety disorder: Secondary | ICD-10-CM | POA: Diagnosis not present

## 2015-08-20 DIAGNOSIS — M4806 Spinal stenosis, lumbar region: Secondary | ICD-10-CM | POA: Diagnosis not present

## 2015-08-20 NOTE — Assessment & Plan Note (Signed)
Patient doing very well at this time. Has been greater than 5 months since her last epidural and she has any increasing pain we will consider a. Differential includes her piriformis syndrome and we can was consider injection of the piriformis muscle necessary. At the moment though with patient doing so well we will make no significant changes. Readdress patient's core strength as well as hip abductor strength. We'll continue to monitor. Patient will come back and see me again in 3 months for further evaluation and treatment.  Spent  25 minutes with patient face-to-face and had greater than 50% of counseling including as described above in assessment and plan.

## 2015-08-20 NOTE — Assessment & Plan Note (Signed)
Patient seems to be doing very well on the Effexor. I do not think he should change the medication at this time. We discussed that we would like to titrate down the medication if she ever does note to try to come off of it. Return to clinic in 3 months

## 2015-08-20 NOTE — Progress Notes (Signed)
[name] [place], [place] [address] Phone: [phone] Subjective:    CC: Right thigh pain follow-up  [id number] Yvette Short is a 70 y.o. female coming in with complaint of right thigh pain. Patient has been seen and has even responded very well to an epidural steroid injection after advanced imaging.Has not needed a repeat. Overall doing very well. States that she has intermittent pain going down the leg but nothing severe. Doing much better on 200 mg a gabapentin. No weakness of the leg. Thinks that she has had significantly more better days than not so good days. Sitting comfortably at night.  Patient also was seen previously and did have significant increase in anxiety. On the Effexor. 0.75 mg. Has responded very well to it. No side effects. Concern over long-term  Side effects but does feel it is helping. Family states it is helping.    Past imaging that is relevant: MRI May 2016 Advanced facet arthrosis at L4-5 resulting in grade 1 anterolisthesis and moderate spinal stenosis. 2. Mild disc and mild-to-moderate facet arthrosis elsewhere as above.    Past Medical History  Diagnosis Date  . HYPERLIPIDEMIA 11/09/2006  . ANXIETY 07/06/2007  . GERD 11/09/2006  . Migraine   . Hypertension   . Diverticulosis    Past Surgical History  Procedure Laterality Date  . Lipoma excision      of back  . Tubal ligation     Social History  Substance Use Topics  . Smoking status: Former Smoker -- 2.50 packs/day for 130 years    Types: Cigarettes    Quit date: 07/28/1974  . Smokeless tobacco: Never Used  . Alcohol Use: 1.2 oz/week    2 Cans of beer per week   Allergies  Allergen Reactions  . Tetracycline Hcl Rash        Family History  Problem Relation Age of Onset  . Dementia Mother   . Colon cancer Father 72  . Cancer Father 71    colon cancer    Past medical history, social, surgical and family history all reviewed in  electronic medical record.   Review of Systems: No headache, visual changes, nausea, vomiting, diarrhea, constipation, dizziness, abdominal pain, skin rash, fevers, chills, night sweats, weight loss, swollen lymph nodes, body aches, joint swelling, muscle aches, chest pain, shortness of breath, mood changes.   Objective Blood pressure 120/82, pulse 76, height 5\' 5"  (1.651 m), weight 123 lb (55.792 kg), SpO2 99 %.  General: No apparent distress alert and oriented x3 mood and affect normal, dressed appropriately. Emotionally more stable than previous exam HEENT: Pupils equal, extraocular movements intact  Respiratory: Patient's speak in full sentences and does not appear short of breath  Cardiovascular: No lower extremity edema, non tender, no erythema  Skin: Warm dry intact with no signs of infection or rash on extremities or on axial skeleton.  Abdomen: Soft nontender  Neuro: Cranial nerves II through XII are intact, neurovascularly intact in all extremities with 2+ DTRs and 2+ pulses.  Lymph: No lymphadenopathy of posterior or anterior cervical chain or axillae bilaterally.  Gait normal with good balance and coordination.  MSK:  Non tender with full range of motion and good stability and symmetric strength and tone of shoulders, elbows, wrist, knee and ankles bilaterally.  Back Exam:  Inspection:  Motion: Flexion 45 deg, Extension 20 deg Stable from previous exam Side Bending to 45 deg bilaterally,  Rotation to 45 deg bilaterally  SLR  laying: Negative  XSLR laying: Negative  Palpable tenderness: Very minimal tenderness which is an improvement FABER: Negative Sensory change: Gross sensation intact to all lumbar and sacral dermatomes.  Reflexes: 2+ at both patellar tendons, 2+ at achilles tendons, Babinski's downgoing.  Strength at foot  Plantar-flexion: 5/5 Dorsi-flexion: 5/5 Eversion: 5/5 Inversion: 5/5  Leg strength  Quad: 5/5 Hamstring: 5/5 Hip flexor: 4+/5 compared to 5 out of 5 on  the contralateral side. Hip abductors: 4/5  Gait unremarkable. Improvement from previous exam      Impression and Recommendations:     This case required medical decision making of moderate complexity.

## 2015-08-20 NOTE — Patient Instructions (Signed)
You are doing amazing I am proud of you In the car have a tennis ball in the back right pocket.  Ice can still help No change in medicine If you want to try gabapentin 100mg  at night you can but if pain worsens go back to 200mg   Continue the effexor and we can always bring you down to 37.5mg  if we are doing well See me again in 3 months. Or call if you need another epidural

## 2015-08-20 NOTE — Progress Notes (Signed)
Pre visit review using our clinic review tool, if applicable. No additional management support is needed unless otherwise documented below in the visit note. 

## 2015-11-17 NOTE — Progress Notes (Signed)
[name] [address], [place] [address] Phone: [phone] Subjective:    CC: Right thigh pain follow-up  [id number]  Yvette Short is a 70 y.o. female coming in with complaint of right thigh pain. Has been 3 months since patient has been seen. Patient was found to have spinal stenosis and did respond very well to an epidural.  Patient also was seen previously and did have significant increase in anxiety. On the Effexor. 75 mg. Has responded very well to it. No side effects. Concern over long-term  patient was to start titrating down off the gabapentin as well.  Patient states Overall she is doing very well. Very mild pain over all but nothing significant. Patient has done very well with the medication were no side effects. Continues a very low dose gabapentin at night.   Past imaging that is relevant: MRI May 2016 Advanced facet arthrosis at L4-5 resulting in grade 1 anterolisthesis and moderate spinal stenosis. 2. Mild disc and mild-to-moderate facet arthrosis elsewhere as Above. Epidural July 2016, December 2016    Past Medical History:  Diagnosis Date  . ANXIETY 07/06/2007  . Diverticulosis   . GERD 11/09/2006  . HYPERLIPIDEMIA 11/09/2006  . Hypertension   . Migraine    Past Surgical History:  Procedure Laterality Date  . LIPOMA EXCISION     of back  . TUBAL LIGATION     Social History  Substance Use Topics  . Smoking status: Former Smoker    Packs/day: 2.50    Years: 130.00    Types: Cigarettes    Quit date: 07/28/1974  . Smokeless tobacco: Never Used  . Alcohol use 1.2 oz/week    2 Cans of beer per week   Allergies  Allergen Reactions  . Tetracycline Hcl Rash        Family History  Problem Relation Age of Onset  . Dementia Mother   . Colon cancer Father 3  . Cancer Father 79    colon cancer    Past medical history, social, surgical and family history all reviewed in electronic medical record.    Review of Systems: No headache, visual changes, nausea, vomiting, diarrhea, constipation, dizziness, abdominal pain, skin rash, fevers, chills, night sweats, weight loss, swollen lymph nodes, body aches, joint swelling, muscle aches, chest pain, shortness of breath, mood changes.   Objective  There were no vitals taken for this visit.  General: No apparent distress alert and oriented x3 mood and affect normal, dressed appropriately. Emotionally more stable than previous exam HEENT: Pupils equal, extraocular movements intact  Respiratory: Patient's speak in full sentences and does not appear short of breath  Cardiovascular: No lower extremity edema, non tender, no erythema  Skin: Warm dry intact with no signs of infection or rash on extremities or on axial skeleton.  Abdomen: Soft nontender  Neuro: Cranial nerves II through XII are intact, neurovascularly intact in all extremities with 2+ DTRs and 2+ pulses.  Lymph: No lymphadenopathy of posterior or anterior cervical chain or axillae bilaterally.  Gait normal with good balance and coordination.  MSK:  Non tender with full range of motion and good stability and symmetric strength and tone of shoulders, elbows, wrist, knee and ankles bilaterally.  Back Exam:  Inspection:  Motion: Flexion 45 deg, Extension 20 deg Stable from previous exam Side Bending to 45 deg bilaterally,  Rotation to 45 deg bilaterally  SLR laying: Negative  XSLR laying: Negative  Palpable tenderness: Very minimal tenderness  which is an improvement FABER: Negative Sensory change: Gross sensation intact to all lumbar and sacral dermatomes.  Reflexes: 2+ at both patellar tendons, 2+ at achilles tendons, Babinski's downgoing.  Strength at foot  Plantar-flexion: 5/5 Dorsi-flexion: 5/5 Eversion: 5/5 Inversion: 5/5  Leg strength  Quad: 5/5 Hamstring: 5/5 Hip flexor: 4+/5 compared to 5 out of 5 on the contralateral side. Hip abductors: 4/5  Gait unremarkable. No change from  previous exam      Impression and Recommendations:     This case required medical decision making of moderate complexity.

## 2015-11-18 ENCOUNTER — Encounter: Payer: Self-pay | Admitting: Family Medicine

## 2015-11-18 ENCOUNTER — Ambulatory Visit (INDEPENDENT_AMBULATORY_CARE_PROVIDER_SITE_OTHER): Payer: Medicare Other | Admitting: Family Medicine

## 2015-11-18 DIAGNOSIS — M48062 Spinal stenosis, lumbar region with neurogenic claudication: Secondary | ICD-10-CM

## 2015-11-18 DIAGNOSIS — M4806 Spinal stenosis, lumbar region: Secondary | ICD-10-CM | POA: Diagnosis not present

## 2015-11-18 NOTE — Assessment & Plan Note (Signed)
Doing significantly well. No change in the Effexor does. We discussed icing, home exercises and stay active. Patient come back and see me every 4-6 further evaluation.

## 2015-11-18 NOTE — Patient Instructions (Addendum)
Great to see you  Yvette Short is your friend  Stay active.  Lets continue the effexor at the same dose.  As long as you are doing well lets check in every 4-6 months.

## 2016-01-02 ENCOUNTER — Encounter: Payer: Self-pay | Admitting: Family Medicine

## 2016-01-05 MED ORDER — VENLAFAXINE HCL ER 37.5 MG PO CP24: 37.5000 mg | ORAL_CAPSULE | Freq: Every day | ORAL | 1 refills | Status: DC

## 2016-02-16 ENCOUNTER — Other Ambulatory Visit: Payer: Self-pay | Admitting: Family Medicine

## 2016-02-16 NOTE — Telephone Encounter (Signed)
Refill done.  

## 2016-02-24 ENCOUNTER — Other Ambulatory Visit: Payer: Self-pay | Admitting: Obstetrics and Gynecology

## 2016-02-24 DIAGNOSIS — Z1231 Encounter for screening mammogram for malignant neoplasm of breast: Secondary | ICD-10-CM

## 2016-02-25 DIAGNOSIS — R198 Other specified symptoms and signs involving the digestive system and abdomen: Secondary | ICD-10-CM

## 2016-02-25 HISTORY — DX: Other specified symptoms and signs involving the digestive system and abdomen: R19.8

## 2016-02-27 ENCOUNTER — Encounter: Payer: Self-pay | Admitting: Genetic Counselor

## 2016-03-01 ENCOUNTER — Ambulatory Visit (HOSPITAL_BASED_OUTPATIENT_CLINIC_OR_DEPARTMENT_OTHER): Payer: Medicare Other | Admitting: Genetic Counselor

## 2016-03-01 ENCOUNTER — Encounter: Payer: Self-pay | Admitting: Genetic Counselor

## 2016-03-01 DIAGNOSIS — Z315 Encounter for genetic counseling: Secondary | ICD-10-CM

## 2016-03-01 DIAGNOSIS — Z8041 Family history of malignant neoplasm of ovary: Secondary | ICD-10-CM | POA: Diagnosis not present

## 2016-03-01 DIAGNOSIS — Z800 Family history of malignant neoplasm of digestive organs: Secondary | ICD-10-CM

## 2016-03-01 DIAGNOSIS — Z803 Family history of malignant neoplasm of breast: Secondary | ICD-10-CM

## 2016-03-01 NOTE — Progress Notes (Signed)
REFERRING PROVIDER: Lisabeth Pick, MD [place], [place] [address]  PRIMARY PROVIDER:  Lisabeth Pick, MD  PRIMARY REASON FOR VISIT:  1. Family history of ovarian cancer   2. Family history of breast cancer   3. Family history of colon cancer      HISTORY OF PRESENT ILLNESS:   [name], a 70 y.o. female, was seen for a [place] cancer genetics consultation at the request of Dr. Leanne Chang due to a family history of cancer.  Ms. Stenseth presents to clinic today to discuss the possibility of a hereditary predisposition to cancer, genetic testing, and to further clarify her future cancer risks, as well as potential cancer risks for family members.  Ms. Heidler is a 70 y.o. female with no personal history of cancer.  Her maternal cousin was recently diagnosed with a RAD51C mutation which increases the risk for ovarian cancer.  Ms. Porco came in to learn more about her risk for cancer based on this finding and her family history of colon cancer.  CANCER HISTORY:   No history exists.     HORMONAL RISK FACTORS:  Menarche was at age 19.  First live birth at age 89.  OCP use for approximately 0 years.  Ovaries intact: yes.  Hysterectomy: no.  Menopausal status: postmenopausal.  HRT use: 22 years. Colonoscopy: yes; normal. Mammogram within the last year: yes. Number of breast biopsies: 0. Up to date with pelvic exams:  yes. Any excessive radiation exposure in the past:  no  Past Medical History:  Diagnosis Date  . ANXIETY 07/06/2007  . Diverticulosis   . Family history of breast cancer   . Family history of colon cancer   . Family history of ovarian cancer   . GERD 11/09/2006  . HYPERLIPIDEMIA 11/09/2006  . Hypertension   . Migraine     Past Surgical History:  Procedure Laterality Date  . LIPOMA EXCISION     of back  . TUBAL LIGATION      Social History   Social History  . Marital status: Married    Spouse name: N/A  . Number of children:  N/A  . Years of education: N/A   Social History Main Topics  . Smoking status: Former Smoker    Packs/day: 2.50    Years: 13.00    Types: Cigarettes    Quit date: 07/28/1974  . Smokeless tobacco: Never Used  . Alcohol use 1.2 oz/week    2 Cans of beer per week  . Drug use: No  . Sexual activity: Not Asked   Other Topics Concern  . None   Social History Narrative  . None     FAMILY HISTORY:  We obtained a detailed, 4-generation family history.  Significant diagnoses are listed below: Family History  Problem Relation Age of Onset  . Dementia Mother   . Colon cancer Father 51  . Colon polyps Brother     gets colonoscopy every 2-3 years  . Uterine cancer Maternal Aunt     dx in her 81s  . Colon cancer Maternal Uncle   . Colon cancer Paternal Aunt   . Heart attack Maternal Grandfather   . Cancer Other     MGMs sister - "abdominal cancer"    The patient has two sons who are cancer free, and has one brother who is cancer free but has colon polyps that cause him to have colonoscopies every 2-3 years.  Her mother passed away at 28 with dementia.  Her mother had one sister who had uterine cancer in her 51's.  This sister had one daughter who had both breast and ovarian cancer and was found to have a RAD51C mutation.  The patient's maternal grandmother died in her 80's.  She had three sisters, one who had an abdominal cancer.  The patient's father died at 18 from colon cancer.  He had seven brothers, two who had colon cancer at unknown ages.  Her paternal grandmother had "intestinal issues", but there is no other known cancer.  Patient's maternal ancestors are of [place] and [place] descent, and paternal ancestors are of [place] descent. There is no reported Ashkenazi Jewish ancestry. There is no known consanguinity.  GENETIC COUNSELING ASSESSMENT: AVIANAH PELLMAN is a 70 y.o. female with a family history of colon, breast and ovarian cancer and a known mutation in [place]. We,  therefore, discussed and recommended the following at today's visit.   CLINICAL CONDITION We spent most of the session discussing RAD51C. Women who are carriers of a single pathogenic RAD51C variant have an increased risk of ovarian cancer. Studies suggest this risk is approximately 5.2-9% (PMID: 33295188, 41660630, 16010932, 35573220, 25427062, 37628315, 17616073, 71062694, 85462703). There is also preliminary evidence of an association with RAD51C and breast cancer, but the association between the gene and the specific condition has not been completely established. This uncertainty may be resolved as new information becomes available. An individual with a RAD51C pathogenic variant will not necessarily develop cancer in their lifetime, however, their risk of cancer is increased over that of the general population.  We also discussed that about 5% of colon cancer is hereditary, with most cases due to Lynch syndrome.  Ms. Court has a brother with lots of colon polyps - enough to increase his screening to every 2-3 years.  She also has several other family members with colon cancer.  However, she does not meet the criteria for genetic testing at this time for Lynch syndrome.  INHERITANCE: Hereditary predisposition to cancer due to pathogenic variants in the RAD51C gene has autosomal dominant inheritance. This means that an individual with a pathogenic variant has a 50% chance of passing the condition on to their offspring. With this result, it is now possible to identify at-risk relatives who can pursue testing for this specific familial variant. Many cases are inherited from a parent, but some cases can occur spontaneously (i.e., an individual with a pathogenic variant has parents who do not have it).  Based on Ms. Mackie's family history, she has a 1 in 17 (12.5%) chance of testing positive for a RAD51C mutation in the family.  Individuals with a single pathogenic variant in RAD51C are also carriers  of autosomal recessive Fanconi anemia (PMID: 50093818). Fanconi anemia is characterized by bone marrow failure with variable additional anomalies that often include short stature, abnormal skin pigmentation, abnormal thumbs, malformations of the skeletal and central nervous systems, and developmental delay (PMID: 2993716, 96789381). Risks for leukemia and early-onset solid tumors are significantly elevated with this disorder (PMID: 01751025, 85277824, 23536144). For there to be a risk of Fanconi anemia in offspring, both parents would each have to have a pathogenic variant in RAD51C; in this case, the risk of having an affected child is 25%.  DISCUSSION: We reviewed the characteristics, features and inheritance patterns of hereditary cancer syndromes. We also discussed genetic testing, including the appropriate family members to test, the process of testing, insurance coverage and turn-around-time for results. We discussed the implications of a negative,  positive and/or variant of uncertain significant result. Typically, Ms. Rando would meet criteria for genetic testing based on her family history of the RAD51C mutation.  However, she has Medicare and Medicare will not test individuals who are unaffected with cancer.  We discussed other low cost testing options, including [facility] who will test family members for $50 plus $9.95 S&H. We recommended Ms. Maguire pursue genetic testing for the [facility] 30-gene panel.  The 30-Gene Cancer Panel offered by [facility] includes sequencing and/or deletion duplication testing of the following 30 genes: APC, ATM, BAP1, BARD1, BMPR1A, BRCA1, BRCA2, BRIP1, CDH1, CDK4, CDKN2A (p14ARF), CDKN2A (p16INK4a), CHEK2, EPCAM, GREM1, MITF, MLH1, MSH2, MSH6, MUTYH, NBN, PALB2, PMS2, POLD1, POLE, PTEN, RAD51C, RAD51D, SMAD4, STK11, and TP53.      PLAN: After considering the risks, benefits, and limitations, Ms. Vallandingham  provided informed consent to pursue genetic  testing and the blood sample was sent to [facility] for analysis of the 30-gene panel. Results should be available within approximately 3-4 weeks' time, at which point they will be disclosed by telephone to Ms. Fritzsche, as will any additional recommendations warranted by these results. Ms. Kun will receive a summary of her genetic counseling visit and a copy of her results once available. This information will also be available in Epic. We encouraged Ms. Mihelich to remain in contact with cancer genetics annually so that we can continuously update the family history and inform her of any changes in cancer genetics and testing that may be of benefit for her family. Ms. Knisely questions were answered to her satisfaction today. Our contact information was provided should additional questions or concerns arise.  Lastly, we encouraged Ms. Kallam to remain in contact with cancer genetics annually so that we can continuously update the family history and inform her of any changes in cancer genetics and testing that may be of benefit for this family.   Ms.  Brockway questions were answered to her satisfaction today. Our contact information was provided should additional questions or concerns arise. Thank you for the referral and allowing [place] to share in the care of your patient.   Jrue Yambao P. Florene Glen, [place], [facility] Certified Genetic Counselor [email or web address] phone: [phone]  The patient was seen for a total of 60 minutes in face-to-face genetic counseling.  This patient was discussed with Drs. Magrinat, Lindi Adie and/or Burr Medico who agrees with the above.    _______________________________________________________________________ For Office Staff:  Number of people involved in session: 4 Was an Intern/ student involved with case: no

## 2016-03-26 ENCOUNTER — Encounter: Payer: Self-pay | Admitting: Genetic Counselor

## 2016-03-26 ENCOUNTER — Ambulatory Visit: Payer: Self-pay | Admitting: Genetic Counselor

## 2016-03-26 ENCOUNTER — Telehealth: Payer: Self-pay | Admitting: Genetic Counselor

## 2016-03-26 DIAGNOSIS — Z803 Family history of malignant neoplasm of breast: Secondary | ICD-10-CM

## 2016-03-26 DIAGNOSIS — Z8041 Family history of malignant neoplasm of ovary: Secondary | ICD-10-CM

## 2016-03-26 DIAGNOSIS — Z1379 Encounter for other screening for genetic and chromosomal anomalies: Secondary | ICD-10-CM

## 2016-03-26 DIAGNOSIS — Z800 Family history of malignant neoplasm of digestive organs: Secondary | ICD-10-CM

## 2016-03-26 HISTORY — DX: Encounter for other screening for genetic and chromosomal anomalies: Z13.79

## 2016-03-26 NOTE — Progress Notes (Signed)
HPI: Ms. Haberl was previously seen in the [place] clinic due to a family history of cancer and concerns regarding a hereditary predisposition to cancer. Please refer to our prior cancer genetics clinic note for more information regarding Ms. Satre's medical, social and family histories, and our assessment and recommendations, at the time. Ms. Klapper recent genetic test results were disclosed to her, as were recommendations warranted by these results. These results and recommendations are discussed in more detail below.  CANCER HISTORY:   No history exists.    FAMILY HISTORY:  We obtained a detailed, 4-generation family history.  Significant diagnoses are listed below: Family History  Problem Relation Age of Onset  . Dementia Mother   . Colon cancer Father 61  . Colon polyps Brother     gets colonoscopy every 2-3 years  . Uterine cancer Maternal Aunt     dx in her 48s  . Colon cancer Maternal Uncle   . Colon cancer Paternal Aunt   . Heart attack Maternal Grandfather   . Cancer Other     MGMs sister - "abdominal cancer"    The patient has two sons who are cancer free, and has one brother who is cancer free but has colon polyps that cause him to have colonoscopies every 2-3 years.  Her mother passed away at 36 with dementia.  Her mother had one sister who had uterine cancer in her 69's.  This sister had one daughter who had both breast and ovarian cancer and was found to have a RAD51C mutation.  The patient's maternal grandmother died in her 29's.  She had three sisters, one who had an abdominal cancer.  The patient's father died at 67 from colon cancer.  He had seven brothers, two who had colon cancer at unknown ages.  Her paternal grandmother had "intestinal issues", but there is no other known cancer.  Patient's maternal ancestors are of [place] descent, and paternal ancestors are of [place] descent. There is no reported Ashkenazi Jewish  ancestry. There is no known consanguinity.  GENETIC TEST RESULTS: Genetic testing reported out on March 19, 2016 through the [facility] 30-gene cancer panel found no deleterious mutations.  The 30-Gene Cancer Panel offered by [facility] includes sequencing and/or deletion duplication testing of the following 30 genes: APC, ATM, BAP1, BARD1, BMPR1A, BRCA1, BRCA2, BRIP1, CDH1, CDK4, CDKN2A (p14ARF), CDKN2A (p16INK4a), CHEK2, EPCAM, GREM1, MITF, MLH1, MSH2, MSH6, MUTYH, NBN, PALB2, PMS2, POLD1, POLE, PTEN, RAD51C, RAD51D, SMAD4, STK11, and TP53.     The test report has been scanned into EPIC and is located under the Molecular Pathology section of the Results Review tab.   We discussed with Ms. Mcgregory that since the current genetic testing is not perfect, it is possible there may be a gene mutation in one of these genes that current testing cannot detect, but that chance is small. We also discussed, that it is possible that another gene that has not yet been discovered, or that we have not yet tested, is responsible for the cancer diagnoses in the family, and it is, therefore, important to remain in touch with cancer genetics in the future so that we can continue to offer Ms. Mccants the most up to date genetic testing.    Ms. Casavant test did not reveal the familial mutation. We call this result a true negative result because the cancer-causing mutation was identified in Ms. Lasater's family, and she did not inherit it.  Given this negative result,  Ms. Ortlieb chances of developing RAD51C-related cancers are the same as they are in the general population.    CANCER SCREENING RECOMMENDATIONS: This normal result is reassuring and indicates that Ms. Blandino does not likely have an increased risk of cancer due to a mutation in one of these genes.  We, therefore, recommended  Ms. Reach continue to follow the cancer screening guidelines provided by her primary healthcare providers.    RECOMMENDATIONS FOR FAMILY MEMBERS: Individuals in this family might be at some increased risk of developing cancer, over the general population risk, simply due to the family history of cancer. We recommended women in this family have a yearly mammogram beginning at age 74, or 25 years younger than the earliest onset of cancer, an annual clinical breast exam, and perform monthly breast self-exams. Women in this family should also have a gynecological exam as recommended by their primary provider. All family members should have a colonoscopy by age 63.  FOLLOW-UP: Lastly, we discussed with Ms. Revelo that cancer genetics is a rapidly advancing field and it is possible that new genetic tests will be appropriate for her and/or her family members in the future. We encouraged her to remain in contact with cancer genetics on an annual basis so we can update her personal and family histories and let her know of advances in cancer genetics that may benefit this family.   Our contact number was provided. Ms. Balzarini questions were answered to her satisfaction, and she knows she is welcome to call us at anytime with additional questions or concerns.   Roma Kayser, MS, [facility] Certified Genetic Counselor [email or web address]

## 2016-03-26 NOTE — Telephone Encounter (Signed)
Negative genetic testing on the [facility] 30 gene panel.  Explained that she was negative for the RAD51C mutation identified in her cousin as well as any genetic changes that she may be at risk for based on the cancer on her father's side of the family.  I released test results to her on the [facility] portal.

## 2016-03-31 NOTE — Progress Notes (Signed)
[facility] [place], [place] [address] Phone: [phone] Subjective:      CC: shoulder pain   [id number]  Yvette Short is a 71 y.o. female coming in with complaint of Right shoulder pain. Been going on for multiple months. Recently worsen. Trying to work out on a regular basis but unable to do so secondary to pain. Patient states that the pain seems to be on the anterior aspect of the shoulder. Worse with reaching behind her back. States that he can be uncomfortable at night. Patient states sometimes some mild weakness. Denies any swelling. Denies any numbness. Better with rest.     Past Medical History:  Diagnosis Date  . ANXIETY 07/06/2007  . Diverticulosis   . Family history of breast cancer   . Family history of colon cancer   . Family history of ovarian cancer   . GERD 11/09/2006  . HYPERLIPIDEMIA 11/09/2006  . Hypertension   . Migraine    Past Surgical History:  Procedure Laterality Date  . LIPOMA EXCISION     of back  . TUBAL LIGATION     Social History   Social History  . Marital status: Married    Spouse name: N/A  . Number of children: N/A  . Years of education: N/A   Social History Main Topics  . Smoking status: Former Smoker    Packs/day: 2.50    Years: 13.00    Types: Cigarettes    Quit date: 07/28/1974  . Smokeless tobacco: Never Used  . Alcohol use 1.2 oz/week    2 Cans of beer per week  . Drug use: No  . Sexual activity: Not Asked   Other Topics Concern  . None   Social History Narrative  . None   Allergies  Allergen Reactions  . Tetracycline Hcl Rash        Family History  Problem Relation Age of Onset  . Dementia Mother   . Colon cancer Father 56  . Colon polyps Brother     gets colonoscopy every 2-3 years  . Uterine cancer Maternal Aunt     dx in her 68s  . Colon cancer Maternal Uncle   . Colon cancer Paternal Aunt   . Heart attack Maternal Grandfather   . Cancer Other    MGMs sister - "abdominal cancer"    Past medical history, social, surgical and family history all reviewed in electronic medical record.  No pertanent information unless stated regarding to the chief complaint.   Review of Systems:Review of systems updated and as accurate as of 04/01/16  No headache, visual changes, nausea, vomiting, diarrhea, constipation, dizziness, abdominal pain, skin rash, fevers, chills, night sweats, weight loss, swollen lymph nodes, body aches, joint swelling, muscle aches, chest pain, shortness of breath, mood changes.   Objective  Blood pressure (!) 160/100, pulse 80, height 5' 5.5" (1.664 m), weight 123 lb (55.8 kg). Systems examined below as of 04/01/16   General: No apparent distress alert and oriented x3 mood and affect normal, dressed appropriately.  HEENT: Pupils equal, extraocular movements intact  Respiratory: Patient's speak in full sentences and does not appear short of breath  Cardiovascular: No lower extremity edema, non tender, no erythema  Skin: Warm dry intact with no signs of infection or rash on extremities or on axial skeleton.  Abdomen: Soft nontender  Neuro: Cranial nerves II through XII are intact, neurovascularly intact in all extremities with 2+ DTRs and 2+ pulses.  Lymph: No lymphadenopathy of posterior or anterior cervical chain or axillae bilaterally.  Gait normal with good balance and coordination.  MSK:  Non tender with full range of motion and good stability and symmetric strength and tone of  elbows, wrist, hip, knee and ankles bilaterally.  Shoulder: Right Inspection reveals no abnormalities, atrophy or asymmetry. Palpation is normal with no tenderness over AC joint or bicipital groove. ROM is full in all planes passively. 4+ out of 5 pain compared to the contralateral side signs of impingement with positive Neer and Hawkin's tests, but negative empty can sign. Speeds and Yergason's tests positive. No labral pathology noted with  negative Obrien's, negative clunk and good stability. Normal scapular function observed. No painful arc and no drop arm sign. No apprehension sign Contralateral shoulder unremarkable  MSK US performed of: Right This study was ordered, performed, and interpreted by Charlann Boxer D.O.  Shoulder:   Supraspinatus:  Mild degenerative tearing but no true acute tear noted Infraspinatus:  Appears normal on long and transverse views. Significant increase in Doppler flow Subscapularis:  Appears normal on long and transverse views. Positive bursa Teres Minor:  Appears normal on long and transverse views. AC joint:  Capsule undistended, no geyser sign. Glenohumeral Joint:  Appears normal without effusion. Glenoid Labrum:  Intact without visualized tears. Biceps Tendon:  Bicep tendon does have hypoechoic changes. Patient on dynamic testing does show some mild subluxation.  Impression: Bicep tendinitis with subluxation  Procedure note [id number]; 15 minutes spent for Therapeutic exercises as stated in above notes.  This included exercises focusing on stretching, strengthening, with significant focus on eccentric aspects.Shoulder Exercises that included:  Basic scapular stabilization to include adduction and depression of scapula Scaption, focusing on proper movement and good control Internal and External rotation utilizing a theraband, with elbow tucked at side entire time Rows with theraband     Proper technique shown and discussed handout in great detail with ATC.  All questions were discussed and answered.     Impression and Recommendations:     This case required medical decision making of moderate complexity.      Note: This dictation was prepared with Dragon dictation along with smaller phrase technology. Any transcriptional errors that result from this process are unintentional.

## 2016-04-01 ENCOUNTER — Encounter: Payer: Self-pay | Admitting: Family Medicine

## 2016-04-01 ENCOUNTER — Ambulatory Visit (INDEPENDENT_AMBULATORY_CARE_PROVIDER_SITE_OTHER): Payer: Medicare Other | Admitting: Family Medicine

## 2016-04-01 ENCOUNTER — Ambulatory Visit: Payer: Self-pay

## 2016-04-01 VITALS — BP 160/100 | HR 80 | Ht 65.5 in | Wt 123.0 lb

## 2016-04-01 DIAGNOSIS — M25511 Pain in right shoulder: Secondary | ICD-10-CM | POA: Diagnosis not present

## 2016-04-01 DIAGNOSIS — M7521 Bicipital tendinitis, right shoulder: Secondary | ICD-10-CM

## 2016-04-01 NOTE — Patient Instructions (Addendum)
Good to see you.  Ice 20 minutes 2 times daily. Usually after activity and before bed. Keep hands within peripheral vision.  You need to keep hands within peripheral vision.  Compression sleeve could help with activity  Avoid heavy lifting.  See me again in 4 weeks.

## 2016-04-01 NOTE — Assessment & Plan Note (Signed)
Patient is more of a biceps subluxation. Discussed with patient at great length. Patient work with Product/process development scientist to learn home exercises. We discussed icing protocol. We discussed compression. Avoid certain activities. Follow-up again in 4 weeks.

## 2016-04-05 ENCOUNTER — Ambulatory Visit: Payer: Medicare Other

## 2016-04-15 ENCOUNTER — Ambulatory Visit: Payer: Medicare Other

## 2016-05-05 ENCOUNTER — Ambulatory Visit
Admission: RE | Admit: 2016-05-05 | Discharge: 2016-05-05 | Disposition: A | Discharge status: Home / Self Care | Payer: Medicare Other | Source: Ambulatory Visit | Attending: Obstetrics and Gynecology | Admitting: Obstetrics and Gynecology

## 2016-05-05 DIAGNOSIS — Z1231 Encounter for screening mammogram for malignant neoplasm of breast: Secondary | ICD-10-CM

## 2016-05-06 ENCOUNTER — Encounter: Payer: Self-pay | Admitting: Family Medicine

## 2016-05-06 ENCOUNTER — Ambulatory Visit: Payer: Self-pay

## 2016-05-06 ENCOUNTER — Ambulatory Visit (INDEPENDENT_AMBULATORY_CARE_PROVIDER_SITE_OTHER): Payer: Medicare Other | Admitting: Family Medicine

## 2016-05-06 VITALS — BP 132/88 | HR 64 | Ht 65.5 in | Wt 122.0 lb

## 2016-05-06 DIAGNOSIS — M25511 Pain in right shoulder: Secondary | ICD-10-CM

## 2016-05-06 DIAGNOSIS — M7521 Bicipital tendinitis, right shoulder: Secondary | ICD-10-CM | POA: Diagnosis not present

## 2016-05-06 NOTE — Patient Instructions (Signed)
Good to see you  Ice is your friend still  PT will be calling you  Keep doing the exercises and try to keep hands within peripheral vision.  See me again in 4 weeks. And if not better lets inject.

## 2016-05-06 NOTE — Assessment & Plan Note (Signed)
Doing well but minimal improvement..  Send though to PT for further treatment. Continue compression and topical NSAIDs.  Will see patient again in 4 weeks.  Spent  25 minutes with patient face-to-face and had greater than 50% of counseling including as described above in assessment and plan.

## 2016-05-06 NOTE — Progress Notes (Signed)
[name] [place], [place] [address] Phone: [phone] Subjective:    CC: shoulder pain f/u  [id number]  Yvette Short is a 71 y.o. female coming in with complaint of Right shoulder pain. Was found to have bicep subluxation.  Much better but not perfect Still have pain with certain activity.  No radiation of the pain, Neck pain still noted.  No new symptoms other then some neck pain Stopped the effexor.     Past Medical History:  Diagnosis Date  . ANXIETY 07/06/2007  . Diverticulosis   . Family history of breast cancer   . Family history of colon cancer   . Family history of ovarian cancer   . GERD 11/09/2006  . HYPERLIPIDEMIA 11/09/2006  . Hypertension   . Migraine    Past Surgical History:  Procedure Laterality Date  . LIPOMA EXCISION     of back  . TUBAL LIGATION     Social History   Social History  . Marital status: Married    Spouse name: N/A  . Number of children: N/A  . Years of education: N/A   Social History Main Topics  . Smoking status: Former Smoker    Packs/day: 2.50    Years: 13.00    Types: Cigarettes    Quit date: 07/28/1974  . Smokeless tobacco: Never Used  . Alcohol use 1.2 oz/week    2 Cans of beer per week  . Drug use: No  . Sexual activity: Not Asked   Other Topics Concern  . None   Social History Narrative  . None   Allergies  Allergen Reactions  . Tetracycline Hcl Rash        Family History  Problem Relation Age of Onset  . Dementia Mother   . Colon cancer Father 74  . Colon polyps Brother     gets colonoscopy every 2-3 years  . Uterine cancer Maternal Aunt     dx in her 61s  . Colon cancer Maternal Uncle   . Colon cancer Paternal Aunt   . Heart attack Maternal Grandfather   . Cancer Other     MGMs sister - "abdominal cancer"    Past medical history, social, surgical and family history all reviewed in electronic medical record.  No pertanent information unless stated  regarding to the chief complaint.   Review of Systems: No headache, visual changes, nausea, vomiting, diarrhea, constipation, dizziness, abdominal pain, skin rash, fevers, chills, night sweats, weight loss, swollen lymph nodes, chest pain, shortness of breath, mood changes.    Objective  Blood pressure 132/88, pulse 64, height 5' 5.5" (1.664 m), weight 122 lb (55.3 kg).   Systems examined below as of 05/06/16 General: NAD A&O x3 mood, affect normal  HEENT: Pupils equal, extraocular movements intact no nystagmus Respiratory: not short of breath at rest or with speaking Cardiovascular: No lower extremity edema, non tender Skin: Warm dry intact with no signs of infection or rash on extremities or on axial skeleton. Abdomen: Soft nontender, no masses Neuro: Cranial nerves  intact, neurovascularly intact in all extremities with 2+ DTRs and 2+ pulses. Lymph: No lymphadenopathy appreciated today  Gait normal with good balance and coordination.  MSK: Non tender with full range of motion and good stability and symmetric strength and tone of elbows, wrist,  knee hips and ankles bilaterally.  Arthritic changes  Shoulder: Right Inspection reveals no abnormalities, atrophy or asymmetry. Palpation is normal with no tenderness over AC joint  or bicipital groove. ROM is full in all planes passively. 4++ out of 5 pain compared to the contralateral side Mil impingement  Speeds and Yergason's tests positive. No labral pathology noted with negative Obrien's, negative clunk and good stability. Normal scapular function observed. No painful arc and no drop arm sign. No apprehension sign Contralateral shoulder unremarkable   Impression and Recommendations:     This case required medical decision making of moderate complexity.      Note: This dictation was prepared with Dragon dictation along with smaller phrase technology. Any transcriptional errors that result from this process are unintentional.

## 2016-06-03 ENCOUNTER — Encounter: Payer: Self-pay | Admitting: Family Medicine

## 2016-06-03 ENCOUNTER — Ambulatory Visit (INDEPENDENT_AMBULATORY_CARE_PROVIDER_SITE_OTHER): Payer: Medicare Other | Admitting: Family Medicine

## 2016-06-03 ENCOUNTER — Ambulatory Visit: Payer: Self-pay

## 2016-06-03 VITALS — BP 122/72 | HR 52 | Ht 65.5 in | Wt 122.0 lb

## 2016-06-03 DIAGNOSIS — M25521 Pain in right elbow: Secondary | ICD-10-CM

## 2016-06-03 DIAGNOSIS — M7521 Bicipital tendinitis, right shoulder: Secondary | ICD-10-CM | POA: Diagnosis not present

## 2016-06-03 NOTE — Progress Notes (Signed)
[facility] [place], [place] [address] Phone: [phone] Subjective:    CC: shoulder pain f/u  [id number]  Yvette Short is a 71 y.o. female coming in with complaint of Right shoulder pain. Was found to have bicep subluxation.  Patient was making some progress with conservative therapy and formal physical therapy. Patient states only 40% better. Still can give her a sharp pain with certain movements. A dull aching throbbing sensation is still there as well. Once to increase activity but is finding it difficult.     Past Medical History:  Diagnosis Date  . ANXIETY 07/06/2007  . Diverticulosis   . Family history of breast cancer   . Family history of colon cancer   . Family history of ovarian cancer   . GERD 11/09/2006  . HYPERLIPIDEMIA 11/09/2006  . Hypertension   . Migraine    Past Surgical History:  Procedure Laterality Date  . LIPOMA EXCISION     of back  . TUBAL LIGATION     Social History   Social History  . Marital status: Married    Spouse name: N/A  . Number of children: N/A  . Years of education: N/A   Social History Main Topics  . Smoking status: Former Smoker    Packs/day: 2.50    Years: 13.00    Types: Cigarettes    Quit date: 07/28/1974  . Smokeless tobacco: Never Used  . Alcohol use 1.2 oz/week    2 Cans of beer per week  . Drug use: No  . Sexual activity: Not Asked   Other Topics Concern  . None   Social History Narrative  . None   Allergies  Allergen Reactions  . Tetracycline Hcl Rash        Family History  Problem Relation Age of Onset  . Dementia Mother   . Colon cancer Father 89  . Colon polyps Brother     gets colonoscopy every 2-3 years  . Uterine cancer Maternal Aunt     dx in her 23s  . Colon cancer Maternal Uncle   . Colon cancer Paternal Aunt   . Heart attack Maternal Grandfather   . Cancer Other     MGMs sister - "abdominal cancer"    Past medical history, social,  surgical and family history all reviewed in electronic medical record.  No pertanent information unless stated regarding to the chief complaint.   Review of Systems: No headache, visual changes, nausea, vomiting, diarrhea, constipation, dizziness, abdominal pain, skin rash, fevers, chills, night sweats, weight loss, swollen lymph nodes, body aches, joint swelling, muscle aches, chest pain, shortness of breath, mood changes.    Objective  Blood pressure 122/72, pulse (!) 52, height 5' 5.5" (1.664 m), weight 122 lb (55.3 kg), SpO2 98 %.   Systems examined below as of 06/03/16 General: NAD A&O x3 mood, affect normal  HEENT: Pupils equal, extraocular movements intact no nystagmus Respiratory: not short of breath at rest or with speaking Cardiovascular: No lower extremity edema, non tender Skin: Warm dry intact with no signs of infection or rash on extremities or on axial skeleton. Abdomen: Soft nontender, no masses Neuro: Cranial nerves  intact, neurovascularly intact in all extremities with 2+ DTRs and 2+ pulses. Lymph: No lymphadenopathy appreciated today  Gait normal with good balance and coordination.  MSK: Non tender with full range of motion and good stability and symmetric strength and tone of elbows, wrist,  knee hips and ankles  bilaterally.  Arthritic changes  Shoulder: Right Inspection reveals no abnormalities, atrophy or asymmetry. Palpation is normal with no tenderness over AC joint mild positive  ROM is full in all planes passively. 5 out of 5 pain compared to the contralateral side No impingement noted today  Speeds and Yergason's tests positive. No labral pathology noted with negative Obrien's, negative clunk and good stability. Normal scapular function observed. No painful arc and no drop arm sign. No apprehension sign Contralateral shoulder unremarkable  Procedure: Real-time Ultrasound Guided Injection of right bicep tendon sheath Device: GE Logiq Q7 Ultrasound guided  injection is preferred based studies that show increased duration, increased effect, greater accuracy, decreased procedural pain, increased response rate, and decreased cost with ultrasound guided versus blind injection.  Verbal informed consent obtained.  Time-out conducted.  Noted no overlying erythema, induration, or other signs of local infection.  Skin prepped in a sterile fashion.  Local anesthesia: Topical Ethyl chloride.  With sterile technique and under real time ultrasound guidance: With a 25-gauge half-inch needle patient was injected with a total of 0.5 mL of 0.5% Marcaine and 0.5 mL of Kenalog 40 mg/dL.  Completed without difficulty  Pain immediately resolved suggesting accurate placement of the medication.  Advised to call if fevers/chills, erythema, induration, drainage, or persistent bleeding.  Images permanently stored and available for review in the ultrasound unit.  Impression: Technically successful ultrasound guided injection.   Impression and Recommendations:     This case required medical decision making of moderate complexity.      Note: This dictation was prepared with Dragon dictation along with smaller phrase technology. Any transcriptional errors that result from this process are unintentional.

## 2016-06-03 NOTE — Assessment & Plan Note (Addendum)
Injected today continue conservative therapy otherwise. Patient declined formal physical therapy. Continue same medications at this point. Patient come back again in 3-4 weeks.

## 2016-06-03 NOTE — Patient Instructions (Signed)
Great to see you  We tried an injection today and I think it will help On Monday start my exercises again 2-3 times a week.  Should get better quickly  See me again in 4 weeks if not perfect or near perfect

## 2016-07-01 ENCOUNTER — Ambulatory Visit: Payer: Medicare Other | Admitting: Family Medicine

## 2016-10-21 ENCOUNTER — Other Ambulatory Visit: Payer: Self-pay | Admitting: Family Medicine

## 2016-10-21 NOTE — Telephone Encounter (Signed)
Refill done.  

## 2017-02-24 ENCOUNTER — Other Ambulatory Visit: Payer: Self-pay | Admitting: Obstetrics and Gynecology

## 2017-02-24 DIAGNOSIS — N644 Mastodynia: Secondary | ICD-10-CM

## 2017-02-25 LAB — CBC AND DIFFERENTIAL
HEMATOCRIT: 42 (ref 36–46)
Hemoglobin: 14.4 (ref 12.0–16.0)
NEUTROS ABS: 5160
Platelets: 269 (ref 150–399)
WBC: 8

## 2017-02-25 LAB — BASIC METABOLIC PANEL
BUN: 20 (ref 4–21)
CREATININE: 0.8 (ref 0.5–1.1)
Glucose: 82
POTASSIUM: 3.2 — AB (ref 3.4–5.3)
Sodium: 140 (ref 137–147)

## 2017-02-25 LAB — LIPID PANEL
CHOLESTEROL: 236 — AB (ref 0–200)
HDL: 96 — AB (ref 35–70)
LDL CALC: 121
LDL/HDL RATIO: 2.5
TRIGLYCERIDES: 89 (ref 40–160)

## 2017-02-25 LAB — TSH: TSH: 1.71 (ref 0.41–5.90)

## 2017-02-25 LAB — HEPATIC FUNCTION PANEL
ALT: 34 (ref 7–35)
AST: 29 (ref 13–35)
Alkaline Phosphatase: 100 (ref 25–125)

## 2017-02-25 LAB — VITAMIN D 25 HYDROXY (VIT D DEFICIENCY, FRACTURES): Vit D, 25-Hydroxy: 45

## 2017-03-02 ENCOUNTER — Ambulatory Visit
Admission: RE | Admit: 2017-03-02 | Discharge: 2017-03-02 | Disposition: A | Discharge status: Home / Self Care | Payer: Medicare Other | Source: Ambulatory Visit | Attending: Obstetrics and Gynecology | Admitting: Obstetrics and Gynecology

## 2017-03-02 DIAGNOSIS — N644 Mastodynia: Secondary | ICD-10-CM

## 2017-03-29 DIAGNOSIS — K5790 Diverticulosis of intestine, part unspecified, without perforation or abscess without bleeding: Secondary | ICD-10-CM | POA: Insufficient documentation

## 2017-03-29 DIAGNOSIS — R933 Abnormal findings on diagnostic imaging of other parts of digestive tract: Secondary | ICD-10-CM | POA: Insufficient documentation

## 2017-03-29 HISTORY — DX: Abnormal findings on diagnostic imaging of other parts of digestive tract: R93.3

## 2017-03-29 HISTORY — DX: Diverticulosis of intestine, part unspecified, without perforation or abscess without bleeding: K57.90

## 2017-03-31 DIAGNOSIS — K635 Polyp of colon: Secondary | ICD-10-CM

## 2017-03-31 HISTORY — DX: Polyp of colon: K63.5

## 2017-03-31 LAB — HM COLONOSCOPY

## 2017-04-03 DIAGNOSIS — Z800 Family history of malignant neoplasm of digestive organs: Secondary | ICD-10-CM | POA: Insufficient documentation

## 2017-04-03 HISTORY — DX: Family history of malignant neoplasm of digestive organs: Z80.0

## 2017-04-05 ENCOUNTER — Other Ambulatory Visit: Payer: Self-pay | Admitting: Obstetrics and Gynecology

## 2017-04-05 DIAGNOSIS — Z1231 Encounter for screening mammogram for malignant neoplasm of breast: Secondary | ICD-10-CM

## 2017-05-03 ENCOUNTER — Institutional Professional Consult (permissible substitution): Payer: Medicare Other | Admitting: Internal Medicine

## 2017-05-10 ENCOUNTER — Ambulatory Visit
Admission: RE | Admit: 2017-05-10 | Discharge: 2017-05-10 | Disposition: A | Discharge status: Home / Self Care | Payer: Medicare Other | Source: Ambulatory Visit | Attending: Obstetrics and Gynecology | Admitting: Obstetrics and Gynecology

## 2017-05-10 DIAGNOSIS — Z1231 Encounter for screening mammogram for malignant neoplasm of breast: Secondary | ICD-10-CM

## 2017-05-18 ENCOUNTER — Encounter: Payer: Self-pay | Admitting: Internal Medicine

## 2017-05-18 ENCOUNTER — Ambulatory Visit: Payer: Medicare Other | Admitting: Internal Medicine

## 2017-05-18 ENCOUNTER — Ambulatory Visit (INDEPENDENT_AMBULATORY_CARE_PROVIDER_SITE_OTHER)
Admission: RE | Admit: 2017-05-18 | Discharge: 2017-05-18 | Disposition: A | Discharge status: Home / Self Care | Payer: Medicare Other | Source: Ambulatory Visit | Attending: Internal Medicine | Admitting: Internal Medicine

## 2017-05-18 ENCOUNTER — Other Ambulatory Visit (INDEPENDENT_AMBULATORY_CARE_PROVIDER_SITE_OTHER): Payer: Medicare Other

## 2017-05-18 VITALS — BP 144/84 | HR 56 | Ht 64.5 in | Wt 115.6 lb

## 2017-05-18 DIAGNOSIS — R05 Cough: Secondary | ICD-10-CM

## 2017-05-18 DIAGNOSIS — R053 Chronic cough: Secondary | ICD-10-CM

## 2017-05-18 HISTORY — DX: Chronic cough: R05.3

## 2017-05-18 LAB — CBC WITH DIFFERENTIAL/PLATELET
BASOS PCT: 1.3 % (ref 0.0–3.0)
Basophils Absolute: 0.1 10*3/uL (ref 0.0–0.1)
EOS PCT: 1.7 % (ref 0.0–5.0)
Eosinophils Absolute: 0.2 10*3/uL (ref 0.0–0.7)
HEMATOCRIT: 45.5 % (ref 36.0–46.0)
HEMOGLOBIN: 15.6 g/dL — AB (ref 12.0–15.0)
LYMPHS PCT: 35.4 % (ref 12.0–46.0)
Lymphs Abs: 3.3 10*3/uL (ref 0.7–4.0)
MCHC: 34.2 g/dL (ref 30.0–36.0)
MCV: 84.4 fl (ref 78.0–100.0)
MONOS PCT: 8.1 % (ref 3.0–12.0)
Monocytes Absolute: 0.8 10*3/uL (ref 0.1–1.0)
NEUTROS PCT: 53.5 % (ref 43.0–77.0)
Neutro Abs: 5 10*3/uL (ref 1.4–7.7)
PLATELETS: 305 10*3/uL (ref 150.0–400.0)
RBC: 5.39 Mil/uL — AB (ref 3.87–5.11)
RDW: 13.2 % (ref 11.5–15.5)
WBC: 9.3 10*3/uL (ref 4.0–10.5)

## 2017-05-18 MED ORDER — OMEPRAZOLE 40 MG PO CPDR: DELAYED_RELEASE_CAPSULE | ORAL | 11 refills | Status: DC

## 2017-05-18 NOTE — Progress Notes (Signed)
Subjective:     Patient ID: Yvette Short, female   DOB: 1946-03-13,    MRN: [id number]  HPI  27 yowf quit smoking 1976 with no resp significant symptoms but incidental  MPNS detected  by Dr Leanne Chang with no change in last f/u study  02/2014 and no dedicated f/u indicated  and since then has developed waxing and waning daily cough > gi w/u [facility] in [place])  ? Neg  for gerd and ent w/u neg so referred to pulmonary clinic 05/18/2017 by Dr  Janace Hoard.  Note prev under care of Dr Delfin Edis with documented gerd/ esophagitis      05/18/2017 1st [facility] office visit/ [facility]   Chief Complaint  Patient presents with  . Pulmonary Consult    Self referral. She c/o cough for the past 2-3 years. Cough is mainly non prod, but she feels like there is something in her throat.   doe = MMRC1 = can walk nl pace, flat grade, can't hurry or go uphills or steps s sob    Indolent onset variable non-productive cough day > noct  And pnds/ not typically disturbing sleep or exac in am    Kouffman Reflux v Neurogenic Cough Differentiator Reflux Comments  Do you awaken from a sound sleep coughing violently?                            With trouble breathing? Rarely no   Do you have choking episodes when you cannot  Get enough air, gasping for air ?              Occ/ better on ppi    Do you usually cough when you lie down into  The bed, or when you just lie down to rest ?                          no   Do you usually cough after meals or eating?         Worse p eat   Do you cough when (or after) you bend over?    no   GERD SCORE     Kouffman Reflux v Neurogenic Cough Differentiator Neurogenic   Do you more-or-less cough all day long? sporadic   Does change of temperature make you cough? no   Does laughing or chuckling cause you to cough? sometimes   Do fumes (perfume, automobile fumes, burned  Toast, etc.,) cause you to cough ?      musty   Does speaking, singing, or talking on the phone cause you to cough    ?               Sometimes    Neurogenic/Airway score      No obvious other pattern in day to day or daytime variability or assoc excess/ purulent sputum or mucus plugs or hemoptysis or cp or chest tightness, subjective wheeze or overt sinus or hb symptoms. No unusual exposure hx or h/o childhood pna/ asthma or knowledge of premature birth.  Sleeping ok flat without nocturnal  or early am exacerbation  of respiratory  c/o's or need for noct saba. Also denies any obvious fluctuation of symptoms with weather or environmental changes or other aggravating or alleviating factors except as outlined above   Current Allergies, Complete Past Medical History, Past Surgical History, Family History, and Social History were reviewed in [employer]  record.  ROS  The following are not active complaints unless bolded Hoarseness, sore throat, dysphagia, dental problems, itching, sneezing,  nasal congestion or discharge of excess mucus or purulent secretions, ear ache,   fever, chills, sweats, unintended wt loss or wt gain, classically pleuritic or exertional cp,  orthopnea pnd or leg swelling, presyncope, palpitations, abdominal pain, anorexia, nausea, vomiting, diarrhea  or change in bowel habits or change in bladder habits, change in stools or change in urine, dysuria, hematuria,  rash, arthralgias, visual complaints, headache, numbness, weakness or ataxia or problems with walking or coordination,  change in mood/affect or memory.        Current Meds  Medication Sig  . amLODipine (NORVASC) 10 MG tablet Take 1 tablet by mouth daily.  . calcium carbonate (OS-CAL) 600 MG TABS Take 600 mg by mouth daily.  . Cholecalciferol (VITAMIN D) 2000 UNITS CAPS Take 1 capsule by mouth daily.    Marland Kitchen estradiol (VIVELLE-DOT) 0.025 MG/24HR Place 1 patch onto the skin 2 (two) times a week.    . gabapentin (NEURONTIN) 100 MG capsule Take 100 mg by mouth at bedtime.  Marland Kitchen levothyroxine (SYNTHROID, LEVOTHROID) 88  MCG tablet Take 88 mcg by mouth daily.  Marland Kitchen omeprazole (PRILOSEC) 40 MG capsule Take 30- 60 min before your first and last meals of the day  . progesterone (PROMETRIUM) 100 MG capsule Take 100 mg by mouth daily.    . [ ]  gabapentin (NEURONTIN) 100 MG capsule TAKE 2 CAPSULES (200 MG TOTAL) BY MOUTH AT BEDTIME. NIGHTLY (Patient taking differently: Take 100 mg by mouth at bedtime. nightly)  . [  omeprazole (PRILOSEC) 40 MG capsule Take 1 capsule by mouth daily before breakfast.          Review of Systems     Objective:   Physical Exam   amb wf nad  Wt Readings from Last 3 Encounters:  05/18/17 115 lb 9.6 oz (52.4 kg)  06/03/16 122 lb (55.3 kg)  05/06/16 122 lb (55.3 kg)     Vital signs reviewed - Note on arrival 02 sats  100% on RA      HEENT: nl dentition, turbinates bilaterally, and oropharynx. Nl external ear canals without cough reflex   NECK :  without JVD/Nodes/TM/ nl carotid upstrokes bilaterally   LUNGS: no acc muscle use,  Nl contour chest which is clear to A and P bilaterally with  cough on insp    CV:  RRR  no s3 or murmur or increase in P2, and no edema   ABD:  soft and nontender with nl inspiratory excursion in the supine position. No bruits or organomegaly appreciated, bowel sounds nl  MS:  Nl gait/ ext warm without deformities, calf tenderness, cyanosis or clubbing No obvious joint restrictions   SKIN: warm and dry without lesions    NEURO:  alert, approp, nl sensorium with  no motor or cerebellar deficits apparent.    CXR PA and Lateral:   05/18/2017 :    I personally reviewed images and  impression as follows:   wnl    Labs ordered 05/18/2017   Allergy profile    Assessment:

## 2017-05-18 NOTE — Assessment & Plan Note (Addendum)
CT chest 03/19/14  No bronchiectasis (not hrct)  Spirometry 05/18/2017  FEV1  2.15 (93%)  Ratio 76 s rx prior  - FENO 05/18/2017  =   Could not perform   - Allergy profile 05/18/2017 >  Eos 0. /  IgE   The most common causes of chronic cough in immunocompetent adults include the following: upper airway cough syndrome (UACS), previously referred to as postnasal drip syndrome (PNDS), which is caused by variety of rhinosinus conditions; (2) asthma; (3) GERD; (4) chronic bronchitis from cigarette smoking or other inhaled environmental irritants; (5) nonasthmatic eosinophilic bronchitis; and (6) bronchiectasis.   These conditions, singly or in combination, have accounted for up to 94% of the causes of chronic cough in prospective studies.   Other conditions have constituted no >6% of the causes in prospective studies These have included bronchogenic carcinoma, chronic interstitial pneumonia, sarcoidosis, left ventricular failure, ACEI-induced cough, and aspiration from a condition associated with pharyngeal dysfunction.    Chronic cough is often simultaneously caused by more than one condition. A single cause has been found from 38 to 82% of the time, multiple causes from 18 to 62%. Multiply caused cough has been the result of three diseases up to 42% of the time.    Most likely this is a form of Upper airway cough syndrome (previously labeled PNDS),  is so named because it's frequently impossible to sort out how much is  CR/sinusitis with freq throat clearing (which can be related to primary GERD)   vs  causing  secondary (" extra esophageal")  GERD from wide swings in gastric pressure that occur with throat clearing, often  promoting self use of mint and menthol lozenges that reduce the lower esophageal sphincter tone and exacerbate the problem further in a cyclical fashion.   These are the same pts (now being labeled as having "irritable larynx syndrome" by some cough centers) who not infrequently have a  history of having failed to tolerate ace inhibitors,  dry powder inhalers or biphosphonates or report having atypical/extraesophageal reflux symptoms that don't respond to standard doses of PPI  and are easily confused as having aecopd or asthma flares by even experienced allergists/ pulmonologists (myself included).    Of the three most common causes of  Sub-acute or recurrent or chronic cough, only one (GERD)  can actually contribute to/ trigger  the other two (asthma and post nasal drip syndrome)  and perpetuate the cylce of cough.  While not intuitively obvious, many patients with chronic low grade reflux do not cough until there is a primary insult that disturbs the protective epithelial barrier and exposes sensitive nerve endings.   This is typically viral but can be due to chronic pnds.      The point is that once this occurs, it is difficult to eliminate the cycle  using anything but a maximally effective acid suppression regimen at least in the short run, accompanied by an appropriate diet to address non acid GERD  For which she is at risk based on use of progesterone chronically and also add 1st gen H1 blockers per guidelines and increase the gabapentin she takes for back pain to 100 tid if tolerates to interrupt the cycle of cough s resorting to narcotics.   Reviewed with pt The standardized cough guidelines published in Chest by Lissa Morales in 2006 are still the best available and consist of a multiple step process (up to 12!) , not a single office visit,  and are intended  to  address this problem logically,  with an alogrithm dependent on response to empiric treatment at  each progressive step  to determine a specific diagnosis with  minimal addtional testing needed. Therefore if adherence is an issue or can't be accurately verified,  it's very unlikely the standard evaluation and treatment will be successful here.    Furthermore, response to therapy (other than acute cough suppression,  which should only be used short term with avoidance of narcotic containing cough syrups if possible), can be a gradual process for which the patient is not likely to  perceive immediate benefit.  Unlike going to an eye doctor where the best perscription is almost always the first one and is immediately effective, this is almost never the case in the management of chronic cough syndromes. Therefore the patient needs to commit up front to consistently adhere to recommendations  for up to 6 weeks of therapy directed at the likely underlying problem(s) before the response can be reasonably evaluated.   Rec return in 6 weeks to regroup with all meds in hand using a trust but verify approach to confirm accurate Medication  Reconciliation The principal here is that until we are certain that the  patients are doing what we've asked, it makes no sense to ask them to do more.    Total time devoted to counseling  > 50 % of initial 60 min office visit:  review case with pt/ discussion of options/alternatives/ personally creating written customized instructions  in presence of pt  then going over those specific  Instructions directly with the pt including how to use all of the meds but in particular covering each new medication in detail and the difference between the maintenance= "automatic" meds and the prns using an action plan format for the latter (If this problem/symptom => do that organization reading Left to right).  Please see AVS from this visit for a full list of these instructions which I personally wrote for this pt and  are unique to this visit.

## 2017-05-18 NOTE — Patient Instructions (Addendum)
Omeprazole 40 mg Take 30- 60 min before your first and last meals of the day   For drainage / throat tickle try take CHLORPHENIRAMINE  4 mg - take one every 4 hours as needed - available over the counter- may cause drowsiness so start with just a bedtime dose or two and see how you tolerate it before trying in daytime    GERD (REFLUX)  is an extremely common cause of respiratory symptoms just like yours , many times with no obvious heartburn at all.    It can be treated with medication, but also with lifestyle changes including elevation of the head of your bed (ideally with 6 inch  bed blocks),  Smoking cessation, avoidance of late meals, excessive alcohol, and avoid fatty foods, chocolate, peppermint, colas, red wine, and acidic juices such as orange juice.  NO MINT OR MENTHOL PRODUCTS SO NO COUGH DROPS   USE SUGARLESS CANDY INSTEAD (Jolley ranchers or Stover's or Life Savers) or even ice chips will also do - the key is to swallow to prevent all throat clearing. NO OIL BASED VITAMINS - use powdered substitutes.   Consider tapering off hormone replacement or a lower dose   Try gabapentin 100 mg x 2 at bedtime and one in am if you can tolerate    Please remember to go to the lab and x-ray department downstairs in the basement  for your tests - we will call you with the results when they are available.   Please schedule a follow up office visit in 6 weeks, call sooner if needed with all medications /inhalers/ solutions in hand so we can verify exactly what you are taking. This includes all medications from all doctors and over the counters

## 2017-05-19 LAB — RESPIRATORY ALLERGY PROFILE REGION II ~~LOC~~
Allergen, Cottonwood, t14: <0.1 kU/L
Allergen, D pternoyssinus,d7: <0.1 kU/L
Allergen, Mouse Urine Protein, e78: <0.1 kU/L
Allergen, Oak,t7: <0.1 kU/L
Aspergillus fumigatus, m3: 0.12 kU/L — ABNORMAL HIGH
Bermuda Grass: <0.1 kU/L
Box Elder IgE: <0.1 kU/L
CAT DANDER: 0.22 kU/L — AB
CLASS: 0
CLASS: 0
CLASS: 0
CLASS: 0
CLASS: 0
CLASS: 0
CLASS: 0
CLASS: 0
CLASS: 0
CLASS: 0
CLASS: 0
CLASS: 0
Class: 0
Class: 0
Class: 0
Class: 0
Class: 0
Class: 0
Class: 0
Class: 0
Class: 0
Class: 0
Class: 0
Class: 0
Cockroach: <0.1 kU/L
D. farinae: <0.1 kU/L
Dog Dander: 0.16 kU/L — ABNORMAL HIGH
Elm IgE: <0.1 kU/L
IgE (Immunoglobulin E), Serum: 75 kU/L (ref <=114)
Johnson Grass: <0.1 kU/L
Pecan/Hickory Tree IgE: <0.1 kU/L
Rough Pigweed  IgE: <0.1 kU/L
Sheep Sorrel IgE: <0.1 kU/L
Timothy Grass: <0.1 kU/L

## 2017-05-19 LAB — INTERPRETATION:

## 2017-05-19 NOTE — Progress Notes (Signed)
Spoke with pt and notified of results per Dr. Wert. Pt verbalized understanding and denied any questions. 

## 2017-06-28 ENCOUNTER — Ambulatory Visit: Payer: Medicare Other | Admitting: Internal Medicine

## 2017-06-28 ENCOUNTER — Encounter: Payer: Self-pay | Admitting: Internal Medicine

## 2017-06-28 DIAGNOSIS — R053 Chronic cough: Secondary | ICD-10-CM

## 2017-06-28 DIAGNOSIS — R059 Cough, unspecified: Secondary | ICD-10-CM

## 2017-06-28 DIAGNOSIS — I10 Essential (primary) hypertension: Secondary | ICD-10-CM

## 2017-06-28 DIAGNOSIS — R05 Cough: Secondary | ICD-10-CM

## 2017-06-28 MED ORDER — BENZONATATE 200 MG PO CAPS: 200.0000 mg | ORAL_CAPSULE | Freq: Three times a day (TID) | ORAL | 1 refills | Status: DC | PRN

## 2017-06-28 NOTE — Assessment & Plan Note (Signed)
CT chest 03/19/14  No bronchiectasis (not hrct)  Spirometry 05/18/2017  FEV1  2.15 (93%)  Ratio 76 s rx prior  - FENO 05/18/2017  =   Could not perform   - Allergy profile 05/18/2017 >  Eos 0.2 /  IgE  75 RAST pos cat > dog > mold - HRCT ordered   Lack of cough resolution on a verified empirical regimen could mean an alternative diagnosis (cough variant asthma) , persistence of the disease state (eg sinusitis-ruled out in 02/2017 or bronchiectasis) , or inadequacy of currently available therapy (eg no medical rx available for non-acid gerd - which I strongly favor here)    cough on inspiration / worse with ex does suggest a pulmonary problem but even if does have incipient PF or bronchiectasis/ MAI etc the main driving force behind her cough is likely gerd aggravate by use of progesterone or high dose CCB or both   rec 1st gen H1 blockers per guidelines   Add tessalon prn to stop the cycle of cough > reflux > cough  Await hrct See hbp    I had an extended discussion with the patient reviewing all relevant studies completed to date and  lasting 25 minutes of a 40  minute office visit addressing refractory longstanding   non-specific but potentially very serious refractory respiratory symptoms of uncertain and potentially multiple  etiologies.   Each maintenance medication was reviewed in detail including most importantly the difference between maintenance and prns and under what circumstances the prns are to be triggered using an action plan format that is not reflected in the computer generated alphabetically organized AVS.    Please see AVS for specific instructions unique to this office visit that I personally wrote and verbalized to the the pt in detail and then reviewed with pt  by my nurse highlighting any changes in therapy/plan of care  recommended at today's visit.

## 2017-06-28 NOTE — Patient Instructions (Addendum)
For drainage / throat tickle try take CHLORPHENIRAMINE  4 mg - take one every 4 hours as needed - available over the counter- may cause drowsiness so start with just a bedtime dose or two and see how you tolerate it before trying in daytime    GERD (REFLUX)  is an extremely common cause of respiratory symptoms just like yours , many times with no obvious heartburn at all.    It can be treated with medication, but also with lifestyle changes including elevation of the head of your bed (ideally with 6 inch  bed blocks),  Smoking cessation, avoidance of late meals, excessive alcohol, and avoid fatty foods, chocolate, peppermint, colas, red wine, and acidic juices such as orange juice.  NO MINT OR MENTHOL PRODUCTS SO NO COUGH DROPS   USE [place]  CANDY INSTEAD (Jolley ranchers or Stover's or Astronomer) or even ice chips will also do - the key is to swallow to prevent all throat clearing. NO OIL BASED VITAMINS - use powdered substitutes.   Please see patient coordinator before you leave today  to schedule HRCT of chest   We may need to change your blood pressure pill and get you off the progesterone since the one problem we know you have that causes cough is reflux and high doses of amlodipine and progestone both make this worse as does your coughing   If still coughing try tessalon 200 mg up to 3 x daily    Please schedule a follow up office visit in 4 weeks, sooner if needed

## 2017-06-28 NOTE — Assessment & Plan Note (Signed)
In the setting of respiratory symptoms of unknown etiology,  It would be preferable to use bystolic, the most beta -1  selective Beta blocker available in sample form, with bisoprolol the most selective generic choice  on the market, at least on a trial basis, to make sure the spillover Beta 2 effects of the less specific Beta blockers are not contributing to this patient's symptoms.   She has major concerns about side effects of various choices and does not have a PCP since Dr Leanne Chang left [place] so referred back to Primary care at [facility]

## 2017-06-28 NOTE — Progress Notes (Signed)
Subjective:     Patient ID: AMAYIAH GOSNELL, female   DOB: [birth year],    MRN: [id number]    Brief patient profile:  76 yowf quit smoking 1976 with no resp significant symptoms but incidental  MPNS detected  by Dr Leanne Chang with no change in last f/u study  02/2014 and no dedicated f/u indicated  and since then has developed waxing and waning daily cough > gi w/u [facility] in [place])  ? Neg  for gerd and ent w/u neg so referred to pulmonary clinic 05/18/2017 by Dr  Janace Hoard.  Note prev under care of Dr Delfin Edis with documented gerd/ esophagitis     History of Present Illness  05/18/2017 1st [facility] office visit/ Ardean Melroy   Chief Complaint  Patient presents with  . Pulmonary Consult    Self referral. She c/o cough for the past 2-3 years. Cough is mainly non prod, but she feels like there is something in her throat.   doe = MMRC1 = can walk nl pace, flat grade, can't hurry or go uphills or steps s sob    Indolent onset variable non-productive cough day > noct  And pnds/ not typically disturbing sleep or exac in am  Kouffman Reflux v Neurogenic Cough Differentiator Reflux Comments  Do you awaken from a sound sleep coughing violently?                            With trouble breathing? Rarely no   Do you have choking episodes when you cannot  Get enough air, gasping for air ?              Occ/ better on ppi    Do you usually cough when you lie down into  The bed, or when you just lie down to rest ?                          no   Do you usually cough after meals or eating?         Worse p eat   Do you cough when (or after) you bend over?    no   GERD SCORE     Kouffman Reflux v Neurogenic Cough Differentiator Neurogenic   Do you more-or-less cough all day long? sporadic   Does change of temperature make you cough? no   Does laughing or chuckling cause you to cough? sometimes   Do fumes (perfume, automobile fumes, burned  Toast, etc.,) cause you to cough ?      musty   Does speaking, singing,  or talking on the phone cause you to cough   ?               Sometimes    Neurogenic/Airway score     rec Omeprazole 40 mg Take 30- 60 min before your first and last meals of the day  For drainage / throat tickle try take CHLORPHENIRAMINE  4 mg - take one every 4 hours as needed -  GERD  Consider tapering off hormone replacement or a lower dose  Try gabapentin 100 mg x 2 at bedtime and one in am if you can tolerate     06/28/2017  Extended f/u ov/Aayana Reinertsen re: refractory daily  Cough since  Around 2015  Chief Complaint  Patient presents with  . Follow-up    Cough has improved some. No new co's.   Dyspnea:  Not limited by breathing from desired activities   Cough: some times with activity/ better p meals  Sleep: fine  SABA use:  None  Dry mouth worse in am  Still on progesterone / not really using h1 much   No obvious day to day or daytime variability or assoc excess/ purulent sputum or mucus plugs or hemoptysis or cp or chest tightness, subjective wheeze or overt sinus or hb symptoms. No unusual exposure hx or h/o childhood pna/ asthma or knowledge of premature birth.  Sleeping ok flat without nocturnal  or early am exacerbation  of respiratory  c/o's or need for noct saba. Also denies any obvious fluctuation of symptoms with weather or environmental changes or other aggravating or alleviating factors except as outlined above   Current Allergies, Complete Past Medical History, Past Surgical History, Family History, and Social History were reviewed in [employer] record.  ROS  The following are not active complaints unless bolded Hoarseness, sore throat, dysphagia, dental problems, itching, sneezing,  nasal congestion or discharge of excess mucus or purulent secretions, ear ache,   fever, chills, sweats, unintended wt loss or wt gain, classically pleuritic or exertional cp,  orthopnea pnd or leg swelling, presyncope, palpitations, abdominal pain, anorexia, nausea,  vomiting, diarrhea  or change in bowel habits or change in bladder habits, change in stools or change in urine, dysuria, hematuria,  rash, arthralgias, visual complaints, headache, numbness, weakness or ataxia or problems with walking or coordination,  change in mood/affect or memory.        Current Meds  Medication Sig  . amLODipine (NORVASC) 10 MG tablet Take 1 tablet by mouth daily.  Marland Kitchen ascorbic acid (VITAMIN C) 250 MG CHEW Chew 250 mg by mouth daily.  . chlorpheniramine (CHLOR-TRIMETON) 4 MG tablet Take 4 mg by mouth every 4 (four) hours as needed for allergies.  Marland Kitchen estradiol (VIVELLE-DOT) 0.025 MG/24HR Place 1 patch onto the skin 2 (two) times a week.    . gabapentin (NEURONTIN) 100 MG capsule Take 100 mg by mouth at bedtime.  Marland Kitchen levothyroxine (SYNTHROID, LEVOTHROID) 88 MCG tablet Take 88 mcg by mouth daily.  . Magnesium 250 MG TABS Take 1 tablet by mouth daily.  Marland Kitchen omeprazole (PRILOSEC) 40 MG capsule Take 30- 60 min before your first and last meals of the day  . progesterone (PROMETRIUM) 100 MG capsule Take 100 mg by mouth daily.    Marland Kitchen UNABLE TO FIND Med Name: Skeletal Strength OTC daily           Objective:   Physical Exam   amb wf nad cough with deep breath    06/28/2017         113   05/18/17 115 lb 9.6 oz (52.4 kg)  06/03/16 122 lb (55.3 kg)  05/06/16 122 lb (55.3 kg)     Vital signs reviewed - Note on arrival 02 sats  100% on RA     HEENT: nl dentition, turbinates bilaterally, and oropharynx. Nl external ear canals without cough reflex   NECK :  without JVD/Nodes/TM/ nl carotid upstrokes bilaterally   LUNGS: no acc muscle use,  Nl contour chest which is clear to A and P bilaterally without cough on insp or exp maneuvers   CV:  RRR  no s3 or murmur or increase in P2, and no edema   ABD:  soft and nontender with nl inspiratory excursion in the supine position. No bruits or organomegaly appreciated, bowel sounds nl  MS:  Nl gait/  ext warm without deformities, calf  tenderness, cyanosis or clubbing No obvious joint restrictions   SKIN: warm and dry without lesions    NEURO:  alert, approp, nl sensorium with  no motor or cerebellar deficits apparent.          Records reviewed:  sinus CT report 03/16/17 from care everywhere  1. Clear sinuses. 2. Rightward nasal septal deviation      Assessment:

## 2017-07-12 ENCOUNTER — Ambulatory Visit (INDEPENDENT_AMBULATORY_CARE_PROVIDER_SITE_OTHER)
Admission: RE | Admit: 2017-07-12 | Discharge: 2017-07-12 | Disposition: A | Discharge status: Home / Self Care | Payer: Medicare Other | Source: Ambulatory Visit | Attending: Internal Medicine | Admitting: Internal Medicine

## 2017-07-12 DIAGNOSIS — R053 Chronic cough: Secondary | ICD-10-CM

## 2017-07-12 DIAGNOSIS — R05 Cough: Secondary | ICD-10-CM

## 2017-07-12 DIAGNOSIS — R059 Cough, unspecified: Secondary | ICD-10-CM

## 2017-07-26 ENCOUNTER — Ambulatory Visit: Payer: Medicare Other | Admitting: Internal Medicine

## 2017-07-26 ENCOUNTER — Encounter: Payer: Self-pay | Admitting: Internal Medicine

## 2017-07-26 VITALS — BP 128/74 | HR 88 | Ht 64.5 in | Wt 114.4 lb

## 2017-07-26 DIAGNOSIS — J479 Bronchiectasis, uncomplicated: Secondary | ICD-10-CM | POA: Diagnosis not present

## 2017-07-26 DIAGNOSIS — R05 Cough: Secondary | ICD-10-CM

## 2017-07-26 DIAGNOSIS — R053 Chronic cough: Secondary | ICD-10-CM

## 2017-07-26 NOTE — Patient Instructions (Addendum)
For cough >  mucinex DM up to 1200 mg up to every 12 hours as needed  Try dymista one twice daily until use it up - point to ear on same side   For drainage / throat tickle continue to take CHLORPHENIRAMINE  4 mg - take one every 4 hours as needed - available over the counter- may cause drowsiness so start with just a bedtime dose or two and see how you tolerate it before trying in daytime     Try just one estrogen patch per day for 6 weeks and if do as well I would contact Dr Tressia Danas for further instructions if able to tolerate the wean    Please schedule a follow up visit in 3 months but call sooner if needed  with all medications /inhalers/ solutions in hand so we can verify exactly what you are taking. This includes all medications from all doctors and over the counters

## 2017-07-26 NOTE — Assessment & Plan Note (Signed)
CT chest 03/19/14  No bronchiectasis (not hrct)  Spirometry 05/18/2017  FEV1  2.15 (93%)  Ratio 76 s rx prior  - FENO 05/18/2017  =   Could not perform   - Allergy profile 05/18/2017 >  Eos 0.2 /  IgE  75 RAST pos cat > dog > mold - HRCT 07/12/2017 >>>  See bronchiectasis - trial of dymista 07/26/2017   Still strongly favor Upper airway cough syndrome (previously labeled PNDS),  is so named because it's frequently impossible to sort out how much is  CR/sinusitis with freq throat clearing (which can be related to primary GERD)   vs  causing  secondary (" extra esophageal")  GERD from wide swings in gastric pressure that occur with throat clearing, often  promoting self use of mint and menthol lozenges that reduce the lower esophageal sphincter tone and exacerbate the problem further in a cyclical fashion.   These are the same pts (now being labeled as having "irritable larynx syndrome" by some cough centers) who not infrequently have a history of having failed to tolerate ace inhibitors,  dry powder inhalers or biphosphonates or report having atypical/extraesophageal reflux symptoms that don't respond to standard doses of PPI  and are easily confused as having aecopd or asthma flares by even experienced allergists/ pulmonologists (myself included).    rec try wean off HRT if possible and trial of dysmista for sense of pnds  F/u q 3 m

## 2017-07-26 NOTE — Progress Notes (Signed)
Subjective:     Patient ID: Yvette Short, female   DOB: [birth year],    MRN: [id number]    Brief patient profile:  71 yowf quit smoking 1976 with no resp significant symptoms but incidental  MPNS detected  by Dr Leanne Chang with no change in last f/u study  02/2014 and no dedicated f/u indicated  and since early 2016  has developed waxing and waning daily cough > gi w/u Kenton Kingfisher in [place])  ? Neg  for gerd and ent w/u neg so referred to pulmonary clinic 05/18/2017 by Dr  Janace Hoard.  Note prev under care of Dr Delfin Edis with documented gerd/ esophagitis     History of Present Illness  05/18/2017 1st [place] [place] [facility] office visit/ Yvette Short   Chief Complaint  Patient presents with  . Pulmonary Consult    Self referral. She c/o cough for the past 2-3 years. Cough is mainly non prod, but she feels like there is something in her throat.   doe = MMRC1 = can walk nl pace, flat grade, can't hurry or go uphills or steps s sob    Indolent onset variable non-productive cough day > noct  And pnds/ not typically disturbing sleep or exac in am  Kouffman Reflux v Neurogenic Cough Differentiator Reflux Comments  Do you awaken from a sound sleep coughing violently?                            With trouble breathing? Rarely no   Do you have choking episodes when you cannot  Get enough air, gasping for air ?              Occ/ better on ppi    Do you usually cough when you lie down into  The bed, or when you just lie down to rest ?                          no   Do you usually cough after meals or eating?         Worse p eat   Do you cough when (or after) you bend over?    no   GERD SCORE     Kouffman Reflux v Neurogenic Cough Differentiator Neurogenic   Do you more-or-less cough all day long? sporadic   Does change of temperature make you cough? no   Does laughing or chuckling cause you to cough? sometimes   Do fumes (perfume, automobile fumes, burned  Toast, etc.,) cause you to cough ?      musty   Does speaking,  singing, or talking on the phone cause you to cough   ?               Sometimes    Neurogenic/Airway score     rec Omeprazole 40 mg Take 30- 60 min before your first and last meals of the day  For drainage / throat tickle try take CHLORPHENIRAMINE  4 mg - take one every 4 hours as needed -  GERD diet  Consider tapering off hormone replacement or a lower dose  Try gabapentin 100 mg x 2 at bedtime and one in am if you can tolerate     06/28/2017  Extended f/u ov/Yvette Short re: refractory daily  Cough since  Around 2015  Chief Complaint  Patient presents with  . Follow-up    Cough has improved some. No new co's.  Dyspnea:  Not limited by breathing from desired activities   Cough: some times with activity/ better p meals  Sleep: fine  SABA use:  None  Dry mouth worse in am  Still on progesterone / not really using h1 much. rec For drainage / throat tickle try take CHLORPHENIRAMINE  4 mg - take one every 4 hours as needed -  GERD diet  Please see patient coordinator before you leave today  to schedule HRCT of chest> min bronchiectasis  We may need to change your blood pressure pill and get you off the progesterone since the one problem we know you have that causes cough is reflux and high doses of amlodipine and progestone both make this worse as does your coughing  If still coughing try tessalon 200 mg up to 3 x daily    07/26/2017  f/u ov/Yvette Short re: uacs x > 4 years/ min bronchiectasis  Chief Complaint  Patient presents with  . Follow-up    Cough has been worse since she had a cold a week ago. Cough is prod with very min, clear sputum.    Dyspnea:  Not limited by breathing from desired activities   Cough: worse with "cold" - tessalon did not help  Sleep: ok no noct symptoms  SABA use:  None   Says previously took nasal sprays that helped per ent / note chronic cough worse p supper, still on progesterone 100 mg daily    No obvious day to day or daytime variability or assoc excess/  purulent sputum or mucus plugs or hemoptysis or cp or chest tightness, subjective wheeze or overt sinus or hb symptoms. No unusual exposure hx or h/o childhood pna/ asthma or knowledge of premature birth.  Sleeping  fine without nocturnal  or early am exacerbation  of respiratory  c/o's or need for noct saba. Also denies any obvious fluctuation of symptoms with weather or environmental changes or other aggravating or alleviating factors except as outlined above   Current Allergies, Complete Past Medical History, Past Surgical History, Family History, and Social History were reviewed in [employer] record.  ROS  The following are not active complaints unless bolded Hoarseness, sore throat, dysphagia, dental problems, itching, sneezing,  nasal congestion or discharge of excess mucus or purulent secretions, ear ache,   fever, chills, sweats, unintended wt loss or wt gain, classically pleuritic or exertional cp,  orthopnea pnd or arm/hand swelling  or leg swelling, presyncope, palpitations, abdominal pain, anorexia, nausea, vomiting, diarrhea  or change in bowel habits or change in bladder habits, change in stools or change in urine, dysuria, hematuria,  rash, arthralgias, visual complaints, headache, numbness, weakness or ataxia or problems with walking or coordination,  change in mood or  memory.        Current Meds  Medication Sig  . amLODipine (NORVASC) 10 MG tablet Take 1 tablet by mouth daily.  Marland Kitchen ascorbic acid (VITAMIN C) 250 MG CHEW Chew 250 mg by mouth daily.  . benzonatate (TESSALON) 200 MG capsule Take 1 capsule (200 mg total) by mouth 3 (three) times daily as needed for cough.  . chlorpheniramine (CHLOR-TRIMETON) 4 MG tablet Take 4 mg by mouth every 4 (four) hours as needed for allergies.  Marland Kitchen estradiol (VIVELLE-DOT) 0.025 MG/24HR Place 1 patch onto the skin 2 (two) times a week.    . gabapentin (NEURONTIN) 100 MG capsule Take   100 mg  x2 by mouth at bedtime.  Marland Kitchen  levothyroxine (SYNTHROID, LEVOTHROID) 88 MCG  tablet Take 88 mcg by mouth daily.  . Magnesium 250 MG TABS Take 1 tablet by mouth daily.  Marland Kitchen omeprazole (PRILOSEC) 40 MG capsule Take 30- 60 min before your first and last meals of the day  . progesterone (PROMETRIUM) 100 MG capsule Take 100 mg by mouth daily.    Marland Kitchen UNABLE TO FIND Med Name: Skeletal Strength OTC daily                    Objective:   Physical Exam    amb wf nad/ minimal cough on inspiration  But  not reproducible with each deep breath    07/26/2017       114   06/28/2017         113   05/18/17 115 lb 9.6 oz (52.4 kg)  06/03/16 122 lb (55.3 kg)  05/06/16 122 lb (55.3 kg)    Vital signs reviewed - Note on arrival 02 sats  97% on RA        HEENT: nl dentition, turbinates bilaterally, and oropharynx. Nl external ear canals without cough reflex   NECK :  without JVD/Nodes/TM/ nl carotid upstrokes bilaterally   LUNGS: no acc muscle use,  Nl contour chest which is clear to A and P bilaterally without cough on insp or exp maneuvers   CV:  RRR  no s3 or murmur or increase in P2, and no edema   ABD:  soft and nontender with nl inspiratory excursion in the supine position. No bruits or organomegaly appreciated, bowel sounds nl  MS:  Nl gait/ ext warm without deformities, calf tenderness, cyanosis or clubbing No obvious joint restrictions   SKIN: warm and dry without lesions    NEURO:  alert, approp, nl sensorium with  no motor or cerebellar deficits apparent.          Assessment:

## 2017-07-26 NOTE — Assessment & Plan Note (Signed)
CT 2012 IMPRESSION:  1. Apical scarring may account for the plain film abnormality.  2. A 6 mm right upper lobe pulmonary nodule.  - - HRCT 07/12/2017 >>>  Scattered minimal cylindrical and varicoid bronchiectasis with associated scattered mucoid impaction and minimal tree-in-bud opacity in both lungs, predominantly in the mid to lower lungs. Findings are stable to slightly worsened since 2015 chest CT.    Bronchiectasis with tree in bud / nodules:  This is an extremely common benign condition in the elderly and does not warrant aggressive eval/ rx at this point unless there is a clinical correlation suggesting unaddressed pulmonary infection (purulent sputum, night sweats, unintended wt loss, doe) or evolution of  obvious changes on plain cxr (as opposed to serial CT, which is way over sensitive to make clinical decisions re intervention and treatment in the elderly, who tend to tolerate both dx and treatment poorly) .    Discussed in detail all the  indications, usual  risks and alternatives  relative to the benefits with patient who agrees to proceed with conservative f/u and add mucinex dm 1200 mg bid.  Discussed pathophysiology/ rx using escalator analogy   > 50 % of 25 min visit spent in counseling

## 2017-08-03 ENCOUNTER — Ambulatory Visit: Payer: Medicare Other | Admitting: Family Medicine

## 2017-08-03 ENCOUNTER — Encounter: Payer: Self-pay | Admitting: Family Medicine

## 2017-08-03 VITALS — BP 148/88 | HR 88 | Temp 98.5°F | Resp 20 | Ht 65.0 in | Wt 114.2 lb

## 2017-08-03 DIAGNOSIS — J479 Bronchiectasis, uncomplicated: Secondary | ICD-10-CM

## 2017-08-03 DIAGNOSIS — R05 Cough: Secondary | ICD-10-CM | POA: Diagnosis not present

## 2017-08-03 DIAGNOSIS — E039 Hypothyroidism, unspecified: Secondary | ICD-10-CM | POA: Diagnosis not present

## 2017-08-03 DIAGNOSIS — R1314 Dysphagia, pharyngoesophageal phase: Secondary | ICD-10-CM

## 2017-08-03 DIAGNOSIS — R053 Chronic cough: Secondary | ICD-10-CM

## 2017-08-03 DIAGNOSIS — R7989 Other specified abnormal findings of blood chemistry: Secondary | ICD-10-CM

## 2017-08-03 DIAGNOSIS — I10 Essential (primary) hypertension: Secondary | ICD-10-CM | POA: Diagnosis not present

## 2017-08-03 DIAGNOSIS — E782 Mixed hyperlipidemia: Secondary | ICD-10-CM | POA: Diagnosis not present

## 2017-08-03 DIAGNOSIS — Z23 Encounter for immunization: Secondary | ICD-10-CM | POA: Diagnosis not present

## 2017-08-03 HISTORY — DX: Dysphagia, pharyngoesophageal phase: R13.14

## 2017-08-03 HISTORY — DX: Hypothyroidism, unspecified: E03.9

## 2017-08-03 LAB — BASIC METABOLIC PANEL
BUN: 19 mg/dL (ref 6–23)
CHLORIDE: 96 meq/L (ref 96–112)
CO2: 30 meq/L (ref 19–32)
CREATININE: 0.73 mg/dL (ref 0.40–1.20)
Calcium: 9.9 mg/dL (ref 8.4–10.5)
GFR: 83.58 mL/min (ref >60.00)
Glucose, Bld: 80 mg/dL (ref 70–99)
POTASSIUM: 3.8 meq/L (ref 3.5–5.1)
Sodium: 137 mEq/L (ref 135–145)

## 2017-08-03 LAB — T4, FREE: Free T4: 1.11 ng/dL (ref 0.60–1.60)

## 2017-08-03 LAB — TSH: TSH: 3.05 u[IU]/mL (ref 0.35–4.50)

## 2017-08-03 MED ORDER — LOSARTAN POTASSIUM 25 MG PO TABS: 25.0000 mg | ORAL_TABLET | Freq: Every day | ORAL | 0 refills | Status: DC

## 2017-08-03 NOTE — Progress Notes (Signed)
Patient ID: Yvette Short, female  DOB: 1946/02/14, 72 y.o.   MRN: [id number] Patient Care Team    Relationship Specialty Notifications Start End  Yvette Hillock, DO PCP - General Family Medicine  08/03/17   Yvette Queen, MD Consulting Physician Obstetrics and Gynecology  08/03/17   Yvette Rockers, MD Consulting Physician Pulmonary Disease  08/03/17   Yvette Pulley, DO Consulting Physician Sports Medicine  08/03/17   Yvette Miyamoto, MD Referring Physician Gastroenterology  08/03/17   Yvette Short, [place]  Optometry  08/03/17     Chief Complaint  Patient presents with  . Establish Care  . Hypertension    Subjective:  Yvette Short is a 72 y.o.  female present for new patient establishment. All past medical history, surgical history, allergies, family history, immunizations, medications and social history were obtained and entered in the electronic medical record today. All recent labs, ED visits and hospitalizations within the last year were reviewed.  Essential hypertension/hyperlipidemia Pt reports compliance with Amlodipine 10 mg Qd. Blood pressures ranges at home have been elevated 259-563 systolic. Patient denies chest pain, shortness of breath or lower extremity edema. She has a chronic cough > 2 years and palpitations.  BMP: Mild hypokalemia on outside records 12/2016. Ordered today CBC: 05/18/2017 WNL Lipid: 02/25/2017 Ch 236, HDL 96, LDL 121, tg 89 Diet: low sodium Exercise: routine exercise RF: htn, hld, former smoker, fhx HD  Elevated serum free T4 level/hypothyroidism Pt states there had been two different opinions surrounding her thyroid replacement. However, she has been started on levothyroxine and has been on steady dose of levothyroxine 88 mcg for many years. Her T4 was abnormal in 12/2016. Pt does not report change in therapy at that time. Her tsh was normal.   Chronic cough/Pharyngoesophageal dysphagia/Bronchiectasis without complication  ([place]) Evaluated by Pulm (Yvette Short), ENT and had EGD 2017 by Yvette Short. Pt reports she has had a chronic cough for greater than 2 years. She was originally started omeprazole after EGD. She was told she has a leaky stomach valve, but no barrett's esophagus. Her omeprazole was increased to 80 mg a day by Yvette Short. He also recommended she increase gabapentin to BID, but she was unable to tolerate increase dose. She has been prescribed tessalon perles. Pt reports she does feel her symptoms are worse after dinner. She has occassions over the last few weeks in which food becomes stuck or slow transit to stomach. Her colonoscopy is UTD completed 03/2017 with 1 polyp. 5 year recall. She does not particularly want to take high dose PPI. She is a former smoker.    Depression screen Yvette Short 2/9 08/03/2017 05/07/2013  Decreased Interest 0 0  Down, Depressed, Hopeless 0 0  PHQ - 2 Score 0 0   No flowsheet data found.    Current Exercise Habits: Structured exercise class, Type of exercise: yoga;walking, Time (Minutes): 60, Frequency (Times/Week): 4, Weekly Exercise (Minutes/Week): 240, Intensity: Mild   Fall Risk  08/03/2017 05/07/2013  Falls in the past year? No No     Immunization History  Administered Date(s) Administered  . Influenza Split 12/21/2010, 12/21/2011  . Influenza Whole 01/04/2008, 12/31/2008, 01/14/2010  . Influenza, High Dose Seasonal PF 01/03/2013, 12/26/2013, 02/12/2016, 12/28/2016  . Pneumococcal Conjugate-13 02/16/2016  . Pneumococcal Polysaccharide-23 04/10/2012  . Td 03/09/2001, 12/10/2008  . Tdap 12/10/2008, 12/28/2016  . Zoster 12/21/2010  . Zoster Recombinat (Shingrix) 08/03/2017    No exam data present  Past Medical History:  Diagnosis Date  . Allergy   . ANXIETY 07/06/2007  . Chicken pox   . Diverticulosis   . Family history of breast cancer   . Family history of colon cancer   . Family history of ovarian cancer   . Frequent UTI   . GERD 11/09/2006  . HYPERLIPIDEMIA  11/09/2006  . Hypertension   . Migraine   . Stomach ulcer 2008   Allergies  Allergen Reactions  . Tetracycline Hcl Rash        Past Surgical History:  Procedure Laterality Date  . LIPOMA EXCISION     of back  . TUBAL LIGATION     Family History  Problem Relation Age of Onset  . Dementia Mother   . Hyperlipidemia Mother   . Colon cancer Father 53  . Colon polyps Brother        gets colonoscopy every 2-3 years  . Hyperlipidemia Brother   . Hypertension Brother   . Uterine cancer Maternal Aunt        dx in her 91s  . Colon cancer Maternal Uncle   . Colon cancer Paternal Aunt   . Heart attack Maternal Grandfather   . Cancer Other        MGMs sister - "abdominal cancer"   Social History   Socioeconomic History  . Marital status: Married    Spouse name: Not on file  . Number of children: Not on file  . Years of education: Not on file  . Highest education level: Not on file  Occupational History  . Not on file  Social Needs  . Financial resource strain: Not on file  . Food insecurity:    Worry: Not on file    Inability: Not on file  . Transportation needs:    Medical: Not on file    Non-medical: Not on file  Tobacco Use  . Smoking status: Former Smoker    Packs/day: 2.50    Years: 13.00    Pack years: 32.50    Types: Cigarettes    Last attempt to quit: 07/28/1974    Years since quitting: 43.0  . Smokeless tobacco: Never Used  Substance and Sexual Activity  . Alcohol use: Yes    Alcohol/week: 1.2 oz    Types: 2 Cans of beer per week  . Drug use: No  . Sexual activity: Yes    Partners: Male  Lifestyle  . Physical activity:    Days per week: Not on file    Minutes per session: Not on file  . Stress: Not on file  Relationships  . Social connections:    Talks on phone: Not on file    Gets together: Not on file    Attends religious service: Not on file    Active member of club or organization: Not on file    Attends meetings of clubs or organizations: Not  on file    Relationship status: Not on file  . Intimate partner violence:    Fear of current or ex partner: Not on file    Emotionally abused: Not on file    Physically abused: Not on file    Forced sexual activity: Not on file  Other Topics Concern  . Not on file  Social History Narrative   Married. Two children.    College grad.    Former smoker.    Exercises routinely.    Drink caffeine.    Wears a hearing aid.    Smoke alarm in the home.  Wears her seatbelt.    Feels safe in her relationships.    Allergies as of 08/03/2017      Reactions   Tetracycline Hcl Rash          Medication List        Accurate as of 08/03/17 11:59 PM. Always use your most recent med list.          amLODipine 10 MG tablet Commonly known as:  NORVASC Take 1 tablet by mouth daily.   ascorbic acid 250 MG Chew Commonly known as:  VITAMIN C Chew 250 mg by mouth daily.   benzonatate 200 MG capsule Commonly known as:  TESSALON Take 1 capsule (200 mg total) by mouth 3 (three) times daily as needed for cough.   chlorpheniramine 4 MG tablet Commonly known as:  CHLOR-TRIMETON Take 4 mg by mouth every 4 (four) hours as needed for allergies.   estradiol 0.025 MG/24HR Commonly known as:  VIVELLE-DOT Place 1 patch onto the skin 2 (two) times a week.   gabapentin 100 MG capsule Commonly known as:  NEURONTIN Take 100 mg by mouth at bedtime.   levothyroxine 88 MCG tablet Commonly known as:  SYNTHROID, LEVOTHROID Take 88 mcg by mouth daily.   losartan 25 MG tablet Commonly known as:  COZAAR Take 1 tablet (25 mg total) by mouth daily.   Magnesium 250 MG Tabs Take 1 tablet by mouth daily.   omeprazole 40 MG capsule Commonly known as:  PRILOSEC Take 30- 60 min before your first and last meals of the day   progesterone 100 MG capsule Commonly known as:  PROMETRIUM Take 100 mg by mouth daily.   UNABLE TO FIND Med Name: Skeletal Strength OTC daily       All past medical history,  surgical history, allergies, family history, immunizations andmedications were updated in the EMR today and reviewed under the history and medication portions of their EMR.    Recent Results (from the past 2160 hour(s))  Resp Allergy Profile Regn2DC DE MD [place] [facility]     Status: Abnormal   Collection Time: 05/18/17  5:01 PM  Result Value Ref Range   Allergen, D pternoyssinus,d7 <0.10 kU/L   Class 0    D. farinae <0.10 kU/L   Class 0    Allergen, P. notatum, m1 <0.10 kU/L   Class 0    CLADOSPORIUM HERBARUM (M2) IGE <0.10 kU/L   Class 0    Aspergillus fumigatus, m3 0.12 (H) kU/L   Class 0    Allergen, A. alternata, m6 <0.10 kU/L   Class 0    Cat Dander 0.22 (H) kU/L   Class 0    Dog Dander 0.16 (H) kU/L   Class 0    Cockroach <0.10 kU/L   Class 0    Box Elder IgE <0.10 kU/L   Class 0    Allergen, Comm Silver Wendee Copp, t9 <0.10 kU/L   Class 0    Allergen, Cedar tree, t12 <0.10 kU/L   Class 0    Allergen, Cottonwood, t14 <0.10 kU/L   Class 0    Allergen, Oak,t7 <0.10 kU/L   Class 0    Elm IgE <0.10 kU/L   Class 0    Pecan/Hickory Tree IgE <0.10 kU/L   Class 0    Allergen, Mulberry, t76 <0.10 kU/L   Class 0    [place] Grass <0.10 kU/L   Class 0    Timothy Grass <0.10 kU/L   Class 0    Johnson Grass <  0.10 kU/L   Class 0    COMMON RAGWEED (SHORT) (W1) IGE <0.10 kU/L   Class 0    Rough Pigweed  IgE <0.10 kU/L   Class 0    Sheep Sorrel IgE <0.10 kU/L   Class 0    Allergen, Mouse Urine Protein, e78 <0.10 kU/L   Class 0    IgE (Immunoglobulin E), Serum 75 <OR=114 kU/L  CBC with Differential/Platelet     Status: Abnormal   Collection Time: 05/18/17  5:01 PM  Result Value Ref Range   WBC 9.3 4.0 - 10.5 K/uL   RBC 5.39 (H) 3.87 - 5.11 Mil/uL   Hemoglobin 15.6 (H) 12.0 - 15.0 g/dL   HCT 45.5 36.0 - 46.0 %   MCV 84.4 78.0 - 100.0 fl   MCHC 34.2 30.0 - 36.0 g/dL   RDW 13.2 11.5 - 15.5 %   Platelets 305.0 150.0 - 400.0 K/uL   Neutrophils Relative % 53.5 43.0 - 77.0 %    Lymphocytes Relative 35.4 12.0 - 46.0 %   Monocytes Relative 8.1 3.0 - 12.0 %   Eosinophils Relative 1.7 0.0 - 5.0 %   Basophils Relative 1.3 0.0 - 3.0 %   Neutro Abs 5.0 1.4 - 7.7 K/uL   Lymphs Abs 3.3 0.7 - 4.0 K/uL   Monocytes Absolute 0.8 0.1 - 1.0 K/uL   Eosinophils Absolute 0.2 0.0 - 0.7 K/uL   Basophils Absolute 0.1 0.0 - 0.1 K/uL  Interpretation:     Status: None   Collection Time: 05/18/17  5:01 PM  Result Value Ref Range   Interpretation      Comment: . Specific                        Level of Allergen IGE Class      kU/L             Specific IGE Antibody  -----         ---------        -------------------   0              <0.10           Absent/Undetectable   0/1        0.10-0.34           Very Low Level   1          0.35-0.69           Low Level   2          0.70-3.49           Moderate Level   3          3.50-17.4           High Level   4          17.5-49.9           Very High Level   5            50-100            Very High Level   6              >100            Very High Level . The clinical relevance of allergen results of 0.10-0.34 kU/L are undetermined and intended for  specialist use. . Allergens denoted with a "**" include results using one or more analyte specific reagents. In those cases, the  test was developed and its analytical performance characteristics have been determined by [employer]. It has not been cleared or approved by the U.S. Food and Drug Administration. This assay  has been v alidated pursuant to the [employer]  and is used for clinical purposes.   TSH     Status: None   Collection Time: 08/03/17 10:11 AM  Result Value Ref Range   TSH 3.05 0.35 - 4.50 uIU/mL  T4, free     Status: None   Collection Time: 08/03/17 10:11 AM  Result Value Ref Range   Free T4 1.11 0.60 - 1.60 ng/dL    Comment: Specimens from patients who are undergoing biotin therapy and /or ingesting biotin supplements may contain high levels of biotin.   The higher biotin concentration in these specimens interferes with this Free T4 assay.  Specimens that contain high levels  of biotin may cause false high results for this Free T4 assay.  Please interpret results in light of the total clinical presentation of the patient.    Basic Metabolic Panel (BMET)     Status: None   Collection Time: 08/03/17 10:11 AM  Result Value Ref Range   Sodium 137 135 - 145 mEq/L   Potassium 3.8 3.5 - 5.1 mEq/L   Chloride 96 96 - 112 mEq/L   CO2 30 19 - 32 mEq/L   Glucose, Bld 80 70 - 99 mg/dL   BUN 19 6 - 23 mg/dL   Creatinine, Ser 0.73 0.40 - 1.20 mg/dL   Calcium 9.9 8.4 - 10.5 mg/dL   GFR 83.58 >60.00 mL/min    Ct Chest High Resolution Result Date: 07/12/2017--> IMPRESSION: 1. Scattered minimal cylindrical and varicoid bronchiectasis with associated scattered mucoid impaction and minimal tree-in-bud opacity in both lungs, predominantly in the mid to lower lungs. Findings are stable to slightly worsened since 2015 chest CT. Findings may be due to chronic infectious bronchiolitis from atypical mycobacterial infection (MAI). 2. Two vessel coronary atherosclerosis. Aortic Atherosclerosis (ICD10-I70.0). Electronically Signed   By: Ilona Sorrel M.D.   On: 07/12/2017 12:56   ROS: 14 pt review of systems performed and negative (unless mentioned in an HPI)  Objective: BP (!) 148/88   Pulse 88   Temp 98.5 F (36.9 C)   Resp 20   Ht 5\' 5"  (1.651 m)   Wt 114 lb 3.2 oz (51.8 kg)   SpO2 96%   BMI 19.00 kg/m  Gen: Afebrile. No acute distress. Nontoxic in appearance, well-developed, well-nourished,  Pleasant caucasian female HENT: AT. [place].  MMM, no oral lesions, adequate dentition. Bilateral nares within normal limits. Throat without erythema, ulcerations or exudates. no Cough on exam, no hoarseness on exam. Eyes:Pupils Equal Round Reactive to light, Extraocular movements intact,  Conjunctiva without redness, discharge or icterus. Neck/lymp/endocrine: Supple,no  lymphadenopathy, no thyromegaly CV: RRR PVC appreciated, no edema, +2/4 P posterior tibialis pulses.  Chest: CTAB, no wheeze, rhonchi or crackles. Abd: Soft. NTND. BS present. no Masses palpated. Skin: Warm and well-perfused. Skin intact. Neuro/Msk: Normal gait. PERLA. EOMi. Alert. Oriented x3.   Psych: Normal affect, dress and demeanor. Normal speech. Normal thought content and judgment.  Assessment/plan: Yvette Short is a 72 y.o. female present for establishment care.  Essential hypertension/hyperlipidemia - BP elevated. Continue Amlodipine 10 mg QD, add losartan 25 mg QD. Considered BB, however she has some HR in the 50-60 already.  Mild hypokalemia on outside records 12/2016. - Basic Metabolic Panel (BMET) - return 1 week with provider appt to  recheck BP, Consider EKG. Consider cards referral.   Elevated serum free T4 level/hypothyroidism - currently prescribed levothyroxine 88 mcg. Abnormal T4 in October.  - TSH - T4, free  Chronic cough/Pharyngoesophageal dysphagia/Bronchiectasis without complication (HCC) - chronic cough > 2 years, on high dose omeprazole. Last EGD 2017.  - pulmonology did not feel it was caused by lung condition. Ct abnormal w/ tree in bud.  - could consider cardio as well, vs metoprolol for PVC control.  - Ambulatory referral to Gastroenterology (new)  Immunization due:  - shingrix #1 provided to pt today.  F/u 3 months for nurse visit and administration of shingrix #2.  Return in about 1 week (around 08/10/2017) for HTN/ekg.   Note is dictated utilizing voice recognition software. Although note has been proof read prior to signing, occasional typographical errors still can be missed. If any questions arise, please do not hesitate to call for verification.  Electronically signed by: Howard Pouch, DO [place]

## 2017-08-03 NOTE — Patient Instructions (Addendum)
I will refer you to new GI to discuss the chronic cough/dysphagia.  I have increased your BP meds by adding losartan 25 mg a day. Follow up in 1 week  Provider appt for Bp recheck and EKG. Bring your cuff with you.  We will call you with labs once available.   Please help [place] help you:  We are honored you have chosen [facility] for your Primary Care home. Below you will find basic instructions that you may need to access in the future. Please help [place] help you by reading the instructions, which cover many of the frequent questions we experience.   Prescription refills and request:  -In order to allow more efficient response time, please call your pharmacy for all refills. They will forward the request electronically to [place]. This allows for the quickest possible response. Request left on a nurse line can take longer to refill, since these are checked as time allows between office patients and other phone calls.  - refill request can take up to 3-5 working days to complete.  - If request is sent electronically and request is appropiate, it is usually completed in 1-2 business days.  - all patients will need to be seen routinely for all chronic medical conditions requiring prescription medications (see follow-up below). If you are overdue for follow up on your condition, you will be asked to make an appointment and we will call in enough medication to cover you until your appointment (up to 30 days).  - all controlled substances will require a face to face visit to request/refill.  - if you desire your prescriptions to go through a new pharmacy, and have an active script at original pharmacy, you will need to call your pharmacy and have scripts transferred to new pharmacy. This is completed between the pharmacy locations and not by your provider.    Results: If any images or labs were ordered, it can take up to 1 week to get results depending on the test ordered and the lab/facility running and  resulting the test. - Normal or stable results, which do not need further discussion, may be released to your mychart immediately with attached note to you. A call may not be generated for normal results. Please make certain to sign up for mychart. If you have questions on how to activate your mychart you can call the front office.  - If your results need further discussion, our office will attempt to contact you via phone, and if unable to reach you after 2 attempts, we will release your abnormal result to your mychart with instructions.  - All results will be automatically released in mychart after 1 week.  - Your provider will provide you with explanation and instruction on all relevant material in your results. Please keep in mind, results and labs may appear confusing or abnormal to the untrained eye, but it does not mean they are actually abnormal for you personally. If you have any questions about your results that are not covered, or you desire more detailed explanation than what was provided, you should make an appointment with your provider to do so.   Our office handles many outgoing and incoming calls daily. If we have not contacted you within 1 week about your results, please check your mychart to see if there is a message first and if not, then contact our office.  In helping with this matter, you help decrease call volume, and therefore allow [place] to be able to  respond to patients needs more efficiently.   Acute office visits (sick visit):  An acute visit is intended for a new problem and are scheduled in shorter time slots to allow schedule openings for patients with new problems. This is the appropriate visit to discuss a new problem. In order to provide you with excellent quality medical care with proper time for you to explain your problem, have an exam and receive treatment with instructions, these appointments should be limited to one new problem per visit. If you experience a new  problem, in which you desire to be addressed, please make an acute office visit, we save openings on the schedule to accommodate you. Please do not save your new problem for any other type of visit, let us take care of it properly and quickly for you.   Follow up visits:  Depending on your condition(s) your provider will need to see you routinely in order to provide you with quality care and prescribe medication(s). Most chronic conditions (Example: hypertension, Diabetes, depression/anxiety... etc), require visits a couple times a year. Your provider will instruct you on proper follow up for your personal medical conditions and history. Please make certain to make follow up appointments for your condition as instructed. Failing to do so could result in lapse in your medication treatment/refills. If you request a refill, and are overdue to be seen on a condition, we will always provide you with a 30 day script (once) to allow you time to schedule.    Medicare wellness (well visit): - we have a wonderful Nurse Maudie Mercury), that will meet with you and provide you will yearly medicare wellness visits. These visits should occur yearly (can not be scheduled less than 1 calendar year apart) and cover preventive health, immunizations, advance directives and screenings you are entitled to yearly through your medicare benefits. Do not miss out on your entitled benefits, this is when medicare will pay for these benefits to be ordered for you.  These are strongly encouraged by your provider and is the appropriate type of visit to make certain you are up to date with all preventive health benefits. If you have not had your medicare wellness exam in the last 12 months, please make certain to schedule one by calling the office and schedule your medicare wellness with Maudie Mercury as soon as possible.   Yearly physical (well visit):  - Adults are recommended to be seen yearly for physicals. Check with your insurance and date of your  last physical, most insurances require one calendar year between physicals. Physicals include all preventive health topics, screenings, medical exam and labs that are appropriate for gender/age and history. You may have fasting labs needed at this visit. This is a well visit (not a sick visit), new problems should not be covered during this visit (see acute visit).  - Pediatric patients are seen more frequently when they are younger. Your provider will advise you on well child visit timing that is appropriate for your their age. - This is not a medicare wellness visit. Medicare wellness exams do not have an exam portion to the visit. Some medicare companies allow for a physical, some do not allow a yearly physical. If your medicare allows a yearly physical you can schedule the medicare wellness with our nurse Maudie Mercury and have your physical with your provider after, on the same day. Please check with insurance for your full benefits.   Late Policy/No Shows:  - all new patients should arrive 15-30 minutes earlier  than appointment to allow [place] time  to  obtain all personal demographics,  insurance information and for you to complete office paperwork. - All established patients should arrive 10-15 minutes earlier than appointment time to update all information and be checked in .  - In our best efforts to run on time, if you are late for your appointment you will be asked to either reschedule or if able, we will work you back into the schedule. There will be a wait time to work you back in the schedule,  depending on availability.  - If you are unable to make it to your appointment as scheduled, please call 24 hours ahead of time to allow [place] to fill the time slot with someone else who needs to be seen. If you do not cancel your appointment ahead of time, you may be charged a no show fee.

## 2017-08-10 ENCOUNTER — Encounter: Payer: Self-pay | Admitting: *Deleted

## 2017-08-10 ENCOUNTER — Ambulatory Visit: Payer: Medicare Other | Admitting: Family Medicine

## 2017-08-10 ENCOUNTER — Encounter: Payer: Self-pay | Admitting: Family Medicine

## 2017-08-10 VITALS — BP 128/78 | HR 66 | Temp 97.8°F | Resp 20 | Ht 65.0 in | Wt 116.0 lb

## 2017-08-10 DIAGNOSIS — I10 Essential (primary) hypertension: Secondary | ICD-10-CM | POA: Diagnosis not present

## 2017-08-10 DIAGNOSIS — E782 Mixed hyperlipidemia: Secondary | ICD-10-CM | POA: Diagnosis not present

## 2017-08-10 DIAGNOSIS — R05 Cough: Secondary | ICD-10-CM | POA: Diagnosis not present

## 2017-08-10 DIAGNOSIS — R002 Palpitations: Secondary | ICD-10-CM | POA: Diagnosis not present

## 2017-08-10 DIAGNOSIS — R053 Chronic cough: Secondary | ICD-10-CM

## 2017-08-10 DIAGNOSIS — R1314 Dysphagia, pharyngoesophageal phase: Secondary | ICD-10-CM

## 2017-08-10 MED ORDER — LOSARTAN POTASSIUM 25 MG PO TABS: 25.0000 mg | ORAL_TABLET | Freq: Every day | ORAL | 0 refills | Status: DC

## 2017-08-10 MED ORDER — AMLODIPINE BESYLATE 10 MG PO TABS: 10.0000 mg | ORAL_TABLET | Freq: Every day | ORAL | 1 refills | Status: DC

## 2017-08-10 NOTE — Progress Notes (Signed)
Patient ID: Yvette Short, female  DOB: 10/10/1945, 72 y.o.   MRN: [id number] Patient Care Team    Relationship Specialty Notifications Start End  Ma Hillock, DO PCP - General Family Medicine  08/03/17   Dian Queen, MD Consulting Physician Obstetrics and Gynecology  08/03/17   Tanda Rockers, MD Consulting Physician Pulmonary Disease  08/03/17   Lyndal Pulley, DO Consulting Physician Sports Medicine  08/03/17   Celedonio Miyamoto, MD Referring Physician Gastroenterology  08/03/17   Marica Otter, [facility]  Optometry  08/03/17     Chief Complaint  Patient presents with  . Hypertension    Subjective:  Yvette Short is a 72 y.o.  female present for follow up HTN after medication change and PAC/chronic cough  Essential hypertension/hyperlipidemia Pt reports compliance with Amlodipine 10 mg Qd and losartan 25 mg QD recently added. She is tolerating medication well, it has not made her chronic cough any worse. She brings her BP cuff with her today and it is not accurate in comparison to calibrated cuff in the office.Patient denies chest pain, shortness of breath, dizziness or lower extremity edema.  She has a chronic cough > 2 years and palpitations.  BMP: 08/03/2017 WNL CBC: 05/18/2017 WNL Lipid: 02/25/2017 Ch 236, HDL 96, LDL 121, tg 89 TSH: 08/03/2017 WNL Diet: low sodium Exercise: routine exercise RF: htn, hld, former smoker, fhx HD prior EKG 2012: Multiple PAC appreciated.    Chronic cough/Pharyngoesophageal dysphagia/Bronchiectasis without complication ([place]) Evaluated by Pulm (Dr. Melvyn Novas), ENT and had EGD 2017 by Dr. Kenton Kingfisher. Pt reports she has had a chronic cough for greater than 2 years. She was originally started omeprazole after EGD. She was told she has a leaky stomach valve, but no barrett's esophagus. Her omeprazole was increased to 80 mg a day by Dr. Melvyn Novas. He also recommended she increase gabapentin to BID, but she was unable to tolerate increase dose. She has been  prescribed tessalon perles. Pt reports she does feel her symptoms are worse after dinner. She has occassions over the last few weeks in which food becomes stuck or slow transit to stomach. Her colonoscopy is UTD completed 03/2017 with 1 polyp. 5 year recall. She does not particularly want to take high dose PPI. She is a former smoker.   Depression screen [facility] 2/9 08/03/2017 05/07/2013  Decreased Interest 0 0  Down, Depressed, Hopeless 0 0  PHQ - 2 Score 0 0   No flowsheet data found.     Fall Risk  08/03/2017 05/07/2013  Falls in the past year? No No    Immunization History  Administered Date(s) Administered  . Influenza Split 12/21/2010, 12/21/2011  . Influenza Whole 01/04/2008, 12/31/2008, 01/14/2010  . Influenza, High Dose Seasonal PF 01/03/2013, 12/26/2013, 02/12/2016, 12/28/2016  . Pneumococcal Conjugate-13 02/16/2016  . Pneumococcal Polysaccharide-23 04/10/2012  . Td 03/09/2001, 12/10/2008  . Tdap 12/10/2008, 12/28/2016  . Zoster 12/21/2010  . Zoster Recombinat (Shingrix) 08/03/2017    No exam data present  Past Medical History:  Diagnosis Date  . Abnormal colonoscopy 03/2017   decreased rectal tone and polyp; 5 year recall  . Abnormal findings on esophagogastroduodenoscopy (EGD) 02/25/2016   Z-line irrg. at 40 cm. No abnomrality of the esophagus to explain dysphagia. Esophagus dilated. H/o + H. Pylori  . Allergy   . ANXIETY 07/06/2007  . Biceps tendonitis on right   . Chicken pox   . Colon polyp 03/31/2017   hyperplastic  . Diverticulosis 03/2017   sigmoid  .  Family history of breast cancer   . Family history of colon cancer   . Family history of ovarian cancer   . Frequent UTI   . GERD 11/09/2006  . H. pylori infection    h/o treated wiht prevpac  . HYPERLIPIDEMIA 11/09/2006  . Hypertension   . Migraine   . Piriformis syndrome of right side   . Stomach ulcer 2008   Allergies  Allergen Reactions  . Tetracycline Hcl Rash        Past Surgical History:  Procedure  Laterality Date  . LIPOMA EXCISION     of back  . TUBAL LIGATION     Family History  Problem Relation Age of Onset  . Dementia Mother   . Hyperlipidemia Mother   . Colon cancer Father 27  . Colon polyps Brother        gets colonoscopy every 2-3 years  . Hyperlipidemia Brother   . Hypertension Brother   . Uterine cancer Maternal Aunt        dx in her 13s  . Colon cancer Maternal Uncle   . Colon cancer Paternal Aunt   . Heart attack Maternal Grandfather   . Cancer Other        MGMs sister - "abdominal cancer"   Social History   Socioeconomic History  . Marital status: Married    Spouse name: Not on file  . Number of children: Not on file  . Years of education: Not on file  . Highest education level: Not on file  Occupational History  . Not on file  Social Needs  . Financial resource strain: Not on file  . Food insecurity:    Worry: Not on file    Inability: Not on file  . Transportation needs:    Medical: Not on file    Non-medical: Not on file  Tobacco Use  . Smoking status: Former Smoker    Packs/day: 2.50    Years: 13.00    Pack years: 32.50    Types: Cigarettes    Last attempt to quit: 07/28/1974    Years since quitting: 43.0  . Smokeless tobacco: Never Used  Substance and Sexual Activity  . Alcohol use: Yes    Alcohol/week: 1.2 oz    Types: 2 Cans of beer per week  . Drug use: No  . Sexual activity: Yes    Partners: Male  Lifestyle  . Physical activity:    Days per week: Not on file    Minutes per session: Not on file  . Stress: Not on file  Relationships  . Social connections:    Talks on phone: Not on file    Gets together: Not on file    Attends religious service: Not on file    Active member of club or organization: Not on file    Attends meetings of clubs or organizations: Not on file    Relationship status: Not on file  . Intimate partner violence:    Fear of current or ex partner: Not on file    Emotionally abused: Not on file     Physically abused: Not on file    Forced sexual activity: Not on file  Other Topics Concern  . Not on file  Social History Narrative   Married. Two children.    College grad.    Former smoker.    Exercises routinely.    Drink caffeine.    Wears a hearing aid.    Smoke alarm in the home.  Wears her seatbelt.    Feels safe in her relationships.    Allergies as of 08/10/2017      Reactions   Tetracycline Hcl Rash          Medication List        Accurate as of 08/10/17 11:15 AM. Always use your most recent med list.          amLODipine 10 MG tablet Commonly known as:  NORVASC Take 1 tablet (10 mg total) by mouth daily.   ascorbic acid 250 MG Chew Commonly known as:  VITAMIN C Chew 250 mg by mouth daily.   benzonatate 200 MG capsule Commonly known as:  TESSALON Take 1 capsule (200 mg total) by mouth 3 (three) times daily as needed for cough.   chlorpheniramine 4 MG tablet Commonly known as:  CHLOR-TRIMETON Take 4 mg by mouth every 4 (four) hours as needed for allergies.   estradiol 0.025 MG/24HR Commonly known as:  VIVELLE-DOT Place 1 patch onto the skin 2 (two) times a week.   gabapentin 100 MG capsule Commonly known as:  NEURONTIN Take 100 mg by mouth at bedtime.   levothyroxine 88 MCG tablet Commonly known as:  SYNTHROID, LEVOTHROID Take 88 mcg by mouth daily.   losartan 25 MG tablet Commonly known as:  COZAAR Take 1 tablet (25 mg total) by mouth daily.   Magnesium 250 MG Tabs Take 1 tablet by mouth daily.   omeprazole 40 MG capsule Commonly known as:  PRILOSEC Take 30- 60 min before your first and last meals of the day   progesterone 100 MG capsule Commonly known as:  PROMETRIUM Take 100 mg by mouth daily.       All past medical history, surgical history, allergies, family history, immunizations andmedications were updated in the EMR today and reviewed under the history and medication portions of their EMR.    Recent Results (from the past  2160 hour(s))  Resp Allergy Profile Regn2DC DE MD [facility]     Status: Abnormal   Collection Time: 05/18/17  5:01 PM  Result Value Ref Range   Allergen, D pternoyssinus,d7 <0.10 kU/L   Class 0    D. farinae <0.10 kU/L   Class 0    Allergen, P. notatum, m1 <0.10 kU/L   Class 0    CLADOSPORIUM HERBARUM (M2) IGE <0.10 kU/L   Class 0    Aspergillus fumigatus, m3 0.12 (H) kU/L   Class 0    Allergen, A. alternata, m6 <0.10 kU/L   Class 0    Cat Dander 0.22 (H) kU/L   Class 0    Dog Dander 0.16 (H) kU/L   Class 0    Cockroach <0.10 kU/L   Class 0    Box Elder IgE <0.10 kU/L   Class 0    Allergen, Comm Silver Wendee Copp, t9 <0.10 kU/L   Class 0    Allergen, Cedar tree, t12 <0.10 kU/L   Class 0    Allergen, Cottonwood, t14 <0.10 kU/L   Class 0    Allergen, Oak,t7 <0.10 kU/L   Class 0    Elm IgE <0.10 kU/L   Class 0    Pecan/Hickory Tree IgE <0.10 kU/L   Class 0    Allergen, Mulberry, t76 <0.10 kU/L   Class 0    [place] Grass <0.10 kU/L   Class 0    Timothy Grass <0.10 kU/L   Class 0    Johnson Grass <0.10 kU/L   Class 0  COMMON RAGWEED (SHORT) (W1) IGE <0.10 kU/L   Class 0    Rough Pigweed  IgE <0.10 kU/L   Class 0    Sheep Sorrel IgE <0.10 kU/L   Class 0    Allergen, Mouse Urine Protein, e78 <0.10 kU/L   Class 0    IgE (Immunoglobulin E), Serum 75 <OR=114 kU/L  CBC with Differential/Platelet     Status: Abnormal   Collection Time: 05/18/17  5:01 PM  Result Value Ref Range   WBC 9.3 4.0 - 10.5 K/uL   RBC 5.39 (H) 3.87 - 5.11 Mil/uL   Hemoglobin 15.6 (H) 12.0 - 15.0 g/dL   HCT 45.5 36.0 - 46.0 %   MCV 84.4 78.0 - 100.0 fl   MCHC 34.2 30.0 - 36.0 g/dL   RDW 13.2 11.5 - 15.5 %   Platelets 305.0 150.0 - 400.0 K/uL   Neutrophils Relative % 53.5 43.0 - 77.0 %   Lymphocytes Relative 35.4 12.0 - 46.0 %   Monocytes Relative 8.1 3.0 - 12.0 %   Eosinophils Relative 1.7 0.0 - 5.0 %   Basophils Relative 1.3 0.0 - 3.0 %   Neutro Abs 5.0 1.4 - 7.7 K/uL   Lymphs Abs 3.3 0.7 -  4.0 K/uL   Monocytes Absolute 0.8 0.1 - 1.0 K/uL   Eosinophils Absolute 0.2 0.0 - 0.7 K/uL   Basophils Absolute 0.1 0.0 - 0.1 K/uL  Interpretation:     Status: None   Collection Time: 05/18/17  5:01 PM  Result Value Ref Range   Interpretation      Comment: . Specific                        Level of Allergen IGE Class      kU/L             Specific IGE Antibody  -----         ---------        -------------------   0              <0.10           Absent/Undetectable   0/1        0.10-0.34           Very Low Level   1          0.35-0.69           Low Level   2          0.70-3.49           Moderate Level   3          3.50-17.4           High Level   4          17.5-49.9           Very High Level   5            50-100            Very High Level   6              >100            Very High Level . The clinical relevance of allergen results of 0.10-0.34 kU/L are undetermined and intended for  specialist use. . Allergens denoted with a "**" include results using one or more analyte specific reagents. In those cases, the test was developed and its analytical performance characteristics have  been determined by [employer]. It has not been cleared or approved by the U.S. Food and Drug Administration. This assay  has been v alidated pursuant to the [employer]  and is used for clinical purposes.   TSH     Status: None   Collection Time: 08/03/17 10:11 AM  Result Value Ref Range   TSH 3.05 0.35 - 4.50 uIU/mL  T4, free     Status: None   Collection Time: 08/03/17 10:11 AM  Result Value Ref Range   Free T4 1.11 0.60 - 1.60 ng/dL    Comment: Specimens from patients who are undergoing biotin therapy and /or ingesting biotin supplements may contain high levels of biotin.  The higher biotin concentration in these specimens interferes with this Free T4 assay.  Specimens that contain high levels  of biotin may cause false high results for this Free T4 assay.  Please interpret results  in light of the total clinical presentation of the patient.    Basic Metabolic Panel (BMET)     Status: None   Collection Time: 08/03/17 10:11 AM  Result Value Ref Range   Sodium 137 135 - 145 mEq/L   Potassium 3.8 3.5 - 5.1 mEq/L   Chloride 96 96 - 112 mEq/L   CO2 30 19 - 32 mEq/L   Glucose, Bld 80 70 - 99 mg/dL   BUN 19 6 - 23 mg/dL   Creatinine, Ser 0.73 0.40 - 1.20 mg/dL   Calcium 9.9 8.4 - 10.5 mg/dL   GFR 83.58 >60.00 mL/min    Ct Chest High Resolution Result Date: 07/12/2017--> IMPRESSION: 1. Scattered minimal cylindrical and varicoid bronchiectasis with associated scattered mucoid impaction and minimal tree-in-bud opacity in both lungs, predominantly in the mid to lower lungs. Findings are stable to slightly worsened since 2015 chest CT. Findings may be due to chronic infectious bronchiolitis from atypical mycobacterial infection (MAI). 2. Two vessel coronary atherosclerosis. Aortic Atherosclerosis (ICD10-I70.0). Electronically Signed   By: Ilona Sorrel M.D.   On: 07/12/2017 12:56   ROS: 14 pt review of systems performed and negative (unless mentioned in an HPI)  Objective: BP 128/78 (BP Location: Left Arm, Patient Position: Sitting, Cuff Size: Normal)   Pulse 66   Temp 97.8 F (36.6 C)   Resp 20   Ht 5\' 5"  (1.651 m)   Wt 116 lb (52.6 kg)   SpO2 100%   BMI 19.30 kg/m   Gen: Afebrile. No acute distress. Nontoxic in appearance. Well developed well nourished, pleasant caucasian female.  HENT: AT. [place]. MMM.  Eyes:Pupils Equal Round Reactive to light, Extraocular movements intact,  Conjunctiva without redness, discharge or icterus. Neck/lymp/endocrine: Supple,no lymphadenopathy, no thyromegaly CV: RRR no murmur PAC appreciated, no edema, +2/4 P posterior tibialis pulses Chest: CTAB, no wheeze or crackles Abd: Soft. NTND. BS present. no Masses palpated.  Neuro:  Normal gait. PERLA. EOMi. Alert. Oriented x3 EKG: SR. HR 63. Frequent PAC, PR 136 QT 442. Unchanged from prior EKG.      Assessment/plan: RONNIKA COLLETT is a 72 y.o. female present for follow up.  Essential hypertension/hyperlipidemia/palpitations - BP stable with addition of losartan to her regimen.  - EKG: SR. HR 63. Frequent PAC, PR 136 QT 442. Unchanged from prior EKG.  - Consider palpitations or cardiac etiology, as potential cause to her cough over the last 2 years. Discussed this with her today. Jointly decided if GI (new referral made last week) does not feel cough is GI related, since has not responded  to high dose PPI, then we will refer to cardio for further eval of cardiac cause.  - Continue Amlodipine 10 mg QD,  losartan 25 mg QD. Refills provided today - BMP: 08/03/2017 WNL - CBC: 05/18/2017 WNL - Lipid: 02/25/2017 Ch 236, HDL 96, LDL 121, tg 89 - TSH: 08/03/2017 WNL - Diet: low sodium - Exercise: routine exercise - F/U 6 months.    Chronic cough/Pharyngoesophageal dysphagia/Bronchiectasis without complication ([facility]) - chronic cough > 2 years, on high dose omeprazole. Last EGD 2017.  - pulmonology did not feel it was caused by lung condition. Ct abnormal w/ tree in bud.  - could consider cardio as well if GI does not feel related to her GI issues  - Continue PPI for now.  - Ambulatory referral to Gastroenterology placed 1 week ago.    Return in about 6 months (around 02/10/2018) for HTN'.   Note is dictated utilizing voice recognition software. Although note has been proof read prior to signing, occasional typographical errors still can be missed. If any questions arise, please do not hesitate to call for verification.  Electronically signed by: Howard Pouch, DO [place]

## 2017-08-10 NOTE — Patient Instructions (Signed)
Palpitations A palpitation is the feeling that your heartbeat is irregular or is faster than normal. It may feel like your heart is fluttering or skipping a beat. Palpitations are usually not a serious problem. They may be caused by many things, including smoking, caffeine, alcohol, stress, and certain medicines. Although most causes of palpitations are not serious, palpitations can be a sign of a serious medical problem. In some cases, you may need further medical evaluation. Follow these instructions at home: Pay attention to any changes in your symptoms. Take these actions to help with your condition:  Avoid the following: ? Caffeinated coffee, tea, soft drinks, diet pills, and energy drinks. ? Chocolate. ? Alcohol.  Do not use any tobacco products, such as cigarettes, chewing tobacco, and e-cigarettes. If you need help quitting, ask your health care provider.  Try to reduce your stress and anxiety. Things that can help you relax include: ? Yoga. ? Meditation. ? Physical activity, such as swimming, jogging, or walking. ? Biofeedback. This is a method that helps you learn to use your mind to control things in your body, such as your heartbeats.  Get plenty of rest and sleep.  Take over-the-counter and prescription medicines only as told by your health care provider.  Keep all follow-up visits as told by your health care provider. This is important.  Contact a health care provider if:  You continue to have a fast or irregular heartbeat after 24 hours.  Your palpitations occur more often. Get help right away if:  You have chest pain or shortness of breath.  You have a severe headache.  You feel dizzy or you faint. This information is not intended to replace advice given to you by your health care provider. Make sure you discuss any questions you have with your health care provider. Document Released: 03/12/2000 Document Revised: 08/18/2015 Document Reviewed: 11/28/2014 Elsevier  Interactive Patient Education  2018 Elsevier Inc.  

## 2017-08-12 ENCOUNTER — Telehealth: Payer: Self-pay | Admitting: Gastroenterology

## 2017-08-12 NOTE — Telephone Encounter (Signed)
Received referral for patient to be seen for Chronic cough and Pharyngoesophageal dysphagia. Former Dr.Brodie pt but then pt transferred to [place], and now wants to come back here. Patient requesting Dr.Danis due to her husband seeing him. Records received and placed on Dr.Danis' desk for review.

## 2017-08-12 NOTE — Telephone Encounter (Signed)
The records appear to indicate a thorough workup revealing that she does not have significant detectable reflux. As such, it seems doubtful that I can of any more assistance than prior GI physicians.

## 2017-08-15 ENCOUNTER — Telehealth: Payer: Self-pay | Admitting: *Deleted

## 2017-08-15 NOTE — Telephone Encounter (Signed)
Spoke with patient husband and notified him that Dr.Danis declined to schedule patient for office visit. Patient husband seemed very confused and upset that Dr.Danis would not see patient even if she just needed a colon. Records in file folder under declined.

## 2017-08-15 NOTE — Telephone Encounter (Signed)
Copied from [place] [id number]. Topic: Referral - Question >> Aug 15, 2017  1:07 PM Synthia Innocent wrote: Reason for CRM: Patient calling and stating that [facility] has declined to see. Please advise

## 2017-08-16 NOTE — Telephone Encounter (Signed)
SW patient. She is going to stay with her current GI provider.

## 2017-08-22 ENCOUNTER — Other Ambulatory Visit: Payer: Self-pay | Admitting: Family Medicine

## 2017-08-23 NOTE — Telephone Encounter (Signed)
Refill denied. Pt has not been seen in over a year.  

## 2017-08-31 NOTE — Progress Notes (Signed)
[name] [place], [place] [address] Phone: [phone] Subjective:    I'm seeing this patient by the request  of:    CC: Back pain follow-up  [id number]  Yvette Short is a 72 y.o. female coming in with complaint of back pain.  Patient has had back pain for quite some time.  Has done fairly well with gabapentin.  Patient states as long as she has the gabapentin she does relatively well.  Patient still has some soreness from time to time but nothing severe.  Nothing that stops her from activity.  Last epidural was December 2016.    Past Medical History:  Diagnosis Date  . Abnormal colonoscopy 03/2017   decreased rectal tone and polyp; 5 year recall  . Abnormal findings on esophagogastroduodenoscopy (EGD) 02/25/2016   Z-line irrg. at 40 cm. No abnomrality of the esophagus to explain dysphagia. Esophagus dilated. H/o + H. Pylori  . Allergy   . ANXIETY 07/06/2007  . Biceps tendonitis on right   . Chicken pox   . Colon polyp 03/31/2017   hyperplastic  . Diverticulosis 03/2017   sigmoid  . Family history of breast cancer   . Family history of colon cancer   . Family history of ovarian cancer   . Frequent UTI   . GERD 11/09/2006  . H. pylori infection    h/o treated wiht prevpac  . HYPERLIPIDEMIA 11/09/2006  . Hypertension   . Migraine   . Piriformis syndrome of right side   . Stomach ulcer 2008   Past Surgical History:  Procedure Laterality Date  . LIPOMA EXCISION     of back  . TUBAL LIGATION     Social History   Socioeconomic History  . Marital status: Married    Spouse name: Not on file  . Number of children: Not on file  . Years of education: Not on file  . Highest education level: Not on file  Occupational History  . Not on file  Social Needs  . Financial resource strain: Not on file  . Food insecurity:    Worry: Not on file    Inability: Not on file  . Transportation needs:    Medical: Not on file   Non-medical: Not on file  Tobacco Use  . Smoking status: Former Smoker    Packs/day: 2.50    Years: 13.00    Pack years: 32.50    Types: Cigarettes    Last attempt to quit: 07/28/1974    Years since quitting: 43.1  . Smokeless tobacco: Never Used  Substance and Sexual Activity  . Alcohol use: Yes    Alcohol/week: 1.2 oz    Types: 2 Cans of beer per week  . Drug use: No  . Sexual activity: Yes    Partners: Male  Lifestyle  . Physical activity:    Days per week: Not on file    Minutes per session: Not on file  . Stress: Not on file  Relationships  . Social connections:    Talks on phone: Not on file    Gets together: Not on file    Attends religious service: Not on file    Active member of club or organization: Not on file    Attends meetings of clubs or organizations: Not on file    Relationship status: Not on file  Other Topics Concern  . Not on file  Social History Narrative   Married. Two children.  College grad.    Former smoker.    Exercises routinely.    Drink caffeine.    Wears a hearing aid.    Smoke alarm in the home.    Wears her seatbelt.    Feels safe in her relationships.    Allergies  Allergen Reactions  . Tetracycline Hcl Rash        Family History  Problem Relation Age of Onset  . Dementia Mother   . Hyperlipidemia Mother   . Colon cancer Father 75  . Colon polyps Brother        gets colonoscopy every 2-3 years  . Hyperlipidemia Brother   . Hypertension Brother   . Uterine cancer Maternal Aunt        dx in her 23s  . Colon cancer Maternal Uncle   . Colon cancer Paternal Aunt   . Heart attack Maternal Grandfather   . Cancer Other        MGMs sister - "abdominal cancer"     Past medical history, social, surgical and family history all reviewed in electronic medical record.  No pertanent information unless stated regarding to the chief complaint.   Review of Systems:Review of systems updated and as accurate as of 09/02/17  No headache,  visual changes, nausea, vomiting, diarrhea, constipation, dizziness, abdominal pain, skin rash, fevers, chills, night sweats, weight loss, swollen lymph nodes, body aches, joint swelling,  chest pain, shortness of breath, mood changes.  Positive muscle aches  Objective  Blood pressure 100/60, pulse 90, height 5\' 5"  (1.651 m), weight 116 lb (52.6 kg), SpO2 93 %. Systems examined below as of 09/02/17   General: No apparent distress alert and oriented x3 mood and affect normal, dressed appropriately.  HEENT: Pupils equal, extraocular movements intact  Respiratory: Patient's speak in full sentences and does not appear short of breath  Cardiovascular: No lower extremity edema, non tender, no erythema  Skin: Warm dry intact with no signs of infection or rash on extremities or on axial skeleton.  Abdomen: Soft nontender  Neuro: Cranial nerves II through XII are intact, neurovascularly intact in all extremities with 2+ DTRs and 2+ pulses.  Lymph: No lymphadenopathy of posterior or anterior cervical chain or axillae bilaterally.  Gait normal with good balance and coordination.  MSK:  Non tender with full range of motion and good stability and symmetric strength and tone of shoulders, elbows, wrist, hip, knee and ankles bilaterally.  Including changes of multiple joints Back examination some mild loss of lordosis.  Patient has limited extension.  Mild tightness with Corky Sox.  Negative straight leg test.  Neurovascular intact distally.     Impression and Recommendations:     This case required medical decision making of moderate complexity.      Note: This dictation was prepared with Dragon dictation along with smaller phrase technology. Any transcriptional errors that result from this process are unintentional.

## 2017-09-02 ENCOUNTER — Encounter: Payer: Self-pay | Admitting: Family Medicine

## 2017-09-02 ENCOUNTER — Ambulatory Visit: Payer: Medicare Other | Admitting: Family Medicine

## 2017-09-02 VITALS — BP 100/60 | HR 90 | Ht 65.0 in | Wt 116.0 lb

## 2017-09-02 DIAGNOSIS — R05 Cough: Secondary | ICD-10-CM | POA: Diagnosis not present

## 2017-09-02 DIAGNOSIS — M48062 Spinal stenosis, lumbar region with neurogenic claudication: Secondary | ICD-10-CM

## 2017-09-02 DIAGNOSIS — R1314 Dysphagia, pharyngoesophageal phase: Secondary | ICD-10-CM

## 2017-09-02 DIAGNOSIS — R053 Chronic cough: Secondary | ICD-10-CM

## 2017-09-02 MED ORDER — GABAPENTIN 100 MG PO CAPS: 200.0000 mg | ORAL_CAPSULE | Freq: Every day | ORAL | 3 refills | Status: DC

## 2017-09-02 MED ORDER — ALBUTEROL SULFATE HFA 108 (90 BASE) MCG/ACT IN AERS: 2.0000 | INHALATION_SPRAY | RESPIRATORY_TRACT | 6 refills | Status: DC | PRN

## 2017-09-02 NOTE — Assessment & Plan Note (Signed)
Patient has had difficulty with this and a chronic cough.  Patient seen multiple different providers and work-up has been fairly significant.  Patient feels like this could be more secondary to her reflux and would like to see a gastroenterology doctor.  Patient willing to staying closer to be within the power system.  We will see how patient would be accepted by another provider within the practice.  Differential includes pulmonary and patient given a albuterol inhaler to try to use 30 minutes before hand.  Most recent CT scanning of the chest was unremarkable for any significant changes from 2015.  Cardiac could be within the differential but no lower extremity swelling so likely not congestive heart failure.  We also discussed other home remedies for the cough.

## 2017-09-02 NOTE — Assessment & Plan Note (Signed)
Stable overall.  Responded well to the epidurals in the past.  Responding well to the gabapentin.  Refill given today.  Discussed icing regimen and home exercises still.  Discussed the importance of core stability and strength.  Follow-up again in 4 to 8 weeks

## 2017-09-02 NOTE — Patient Instructions (Addendum)
Good to see you  Refilled gabapentin  Iron 65mg  with 500mg  of vitamin C Albuterol inhaler 30 minutes before working out  Teaspoon of honey  Rye whiskey 1/2 shot before bed for cough  See me again in 4 weeks if you need me otherwise see me when you need me

## 2017-09-24 ENCOUNTER — Other Ambulatory Visit: Payer: Self-pay | Admitting: Family Medicine

## 2017-09-26 NOTE — Telephone Encounter (Signed)
Refill done.  

## 2017-10-25 ENCOUNTER — Ambulatory Visit: Payer: Medicare Other | Admitting: Internal Medicine

## 2017-10-25 ENCOUNTER — Encounter: Payer: Self-pay | Admitting: Internal Medicine

## 2017-10-25 VITALS — BP 132/80 | HR 72 | Ht 64.5 in | Wt 117.6 lb

## 2017-10-25 DIAGNOSIS — J479 Bronchiectasis, uncomplicated: Secondary | ICD-10-CM | POA: Diagnosis not present

## 2017-10-25 DIAGNOSIS — R05 Cough: Secondary | ICD-10-CM

## 2017-10-25 DIAGNOSIS — R053 Chronic cough: Secondary | ICD-10-CM

## 2017-10-25 MED ORDER — AZELASTINE-FLUTICASONE 137-50 MCG/ACT NA SUSP: 1.0000 | Freq: Two times a day (BID) | NASAL | 3 refills | Status: DC

## 2017-10-25 NOTE — Progress Notes (Signed)
Subjective:     Patient ID: Yvette Short, female   DOB: [birth year],    MRN: [id number]    Brief patient profile:  41 yowf quit smoking 1976 with no resp significant symptoms but incidental  MPNS detected  by Dr Yvette Short with no change in last f/u study  02/2014 and no dedicated f/u indicated  and since early 2016  has developed waxing and waning daily cough > gi w/u Yvette Short in [place])  ? Neg  for gerd and ent w/u neg Yvette Short)  so referred to pulmonary clinic 05/18/2017 by Dr  Yvette Short.  Note prev under care of Dr Yvette Short with documented gerd/ esophagitis     History of Present Illness  05/18/2017 1st [facility] office visit/ Yvette Short   Chief Complaint  Patient presents with  . Pulmonary Consult    Self referral. She c/o cough for the past 2-3 years. Cough is mainly non prod, but she feels like there is something in her throat.   doe = MMRC1 = can walk nl pace, flat grade, can't hurry or go uphills or steps s sob    Indolent onset variable non-productive cough day > noct  And pnds/ not typically disturbing sleep or exac in am  Kouffman Reflux v Neurogenic Cough Differentiator Reflux Comments  Do you awaken from a sound sleep coughing violently?                            With trouble breathing? Rarely no   Do you have choking episodes when you cannot  Get enough air, gasping for air ?              Occ/ better on ppi    Do you usually cough when you lie down into  The bed, or when you just lie down to rest ?                          no   Do you usually cough after meals or eating?         Worse p eat   Do you cough when (or after) you bend over?    no   GERD SCORE     Kouffman Reflux v Neurogenic Cough Differentiator Neurogenic   Do you more-or-less cough all day long? sporadic   Does change of temperature make you cough? no   Does laughing or chuckling cause you to cough? sometimes   Do fumes (perfume, automobile fumes, burned  Toast, etc.,) cause you to cough ?      musty   Does  speaking, singing, or talking on the phone cause you to cough   ?               Sometimes    Neurogenic/Airway score     rec Omeprazole 40 mg Take 30- 60 min before your first and last meals of the day  For drainage / throat tickle try take CHLORPHENIRAMINE  4 mg - take one every 4 hours as needed -  GERD diet  Consider tapering off hormone replacement or a lower dose  Try gabapentin 100 mg x 2 at bedtime and one in am if you can tolerate     06/28/2017  Extended f/u ov/Yvette Short re: refractory daily  Cough since  Around 2015  Chief Complaint  Patient presents with  . Follow-up    Cough has improved some. No new  co's.  Dyspnea:  Not limited by breathing from desired activities   Cough: some times with activity/ better p meals  Sleep: fine  SABA use:  None  Dry mouth worse in am  Still on progesterone / not really using h1 much. rec For drainage / throat tickle try take CHLORPHENIRAMINE  4 mg - take one every 4 hours as needed -  GERD diet  Please see patient coordinator before you leave today  to schedule HRCT of chest> min bronchiectasis  We may need to change your blood pressure pill and get you off the progesterone since the one problem we know you have that causes cough is reflux and high doses of amlodipine and progestone both make this worse as does your coughing  If still coughing try tessalon 200 mg up to 3 x daily    07/26/2017  f/u ov/Yvette Short re: uacs x > 4 years/ min bronchiectasis  Chief Complaint  Patient presents with  . Follow-up    Cough has been worse since she had a cold a week ago. Cough is prod with very min, clear sputum.    Dyspnea:  Not limited by breathing from desired activities   Cough: worse with "cold" - tessalon did not help  Sleep: ok no noct symptoms  SABA use:  None   Says previously took nasal sprays that helped per ent / note chronic cough worse p supper, still on progesterone 100 mg daily  rec For cough >  mucinex DM up to 1200 mg up to every 12  hours as needed Try dymista one twice daily until use it up - point to ear on same side  For drainage / throat tickle continue to take CHLORPHENIRAMINE  4 mg - take one every 4 hours as needed - available over the counter- may cause drowsiness so start with just a bedtime dose or two and see how you tolerate it before trying in daytime   Try just one estrogen patch per day for 6 weeks and if do as well I would contact Dr Yvette Short for further instructions if able to tolerate the wean Please schedule a follow up visit in 3 months but call sooner if needed  with all medications /inhalers/ solutions in hand so we can verify exactly what you are taking. This includes all medications from all doctors and over the counters    10/25/2017  f/u ov/Yvette Short re:  Cough since April 2015/ better with dymista but no  consistent with it Chief Complaint  Patient presents with  . Follow-up    Cough is unchanged. No new co's. She has not used her albuterol.    Dyspnea:  Not limited by breathing from desired activities   Cough: seems worse with deep breath/ ex  Worse with certain odors  Sleeping: most nights s cough  SABA use: did not help but not sure it worked    No obvious day to day or daytime variability or assoc excess/ purulent sputum or mucus plugs or hemoptysis or cp or chest tightness, subjective wheeze or overt sinus or hb symptoms.   Sleeping ok most nocts  without nocturnal  or early am exacerbation  of respiratory  c/o's or need for noct saba. Also denies any obvious fluctuation of symptoms with weather or environmental changes or other aggravating or alleviating factors except as outlined above   No unusual exposure hx or h/o childhood pna/ asthma or knowledge of premature birth.  Current Allergies, Complete Past Medical History, Past Surgical  History, Family History, and Social History were reviewed in [employer] record.  ROS  The following are not active complaints unless  bolded Hoarseness, sore throat, dysphagia, dental problems, itching, sneezing,  nasal congestion or discharge of excess mucus or purulent secretions, ear ache,   fever, chills, sweats, unintended wt loss or wt gain, classically pleuritic or exertional cp,  orthopnea pnd or arm/hand swelling  or leg swelling, presyncope, palpitations, abdominal pain, anorexia, nausea, vomiting, diarrhea  or change in bowel habits or change in bladder habits, change in stools or change in urine, dysuria, hematuria,  rash, arthralgias, visual complaints, headache, numbness, weakness or ataxia or problems with walking or coordination,  change in mood or  memory.        Current Meds - not ale to verify as did not bring as req   Medication Sig  . albuterol (PROVENTIL HFA;VENTOLIN HFA) 108 (90 Base) MCG/ACT inhaler Inhale 2 puffs into the lungs every 4 (four) hours as needed for wheezing or shortness of breath.  Marland Kitchen amLODipine (NORVASC) 10 MG tablet Take 1 tablet (10 mg total) by mouth daily.  Marland Kitchen ascorbic acid (VITAMIN C) 250 MG CHEW Chew 250 mg by mouth daily.  . Azelastine-Fluticasone (DYMISTA) 137-50 MCG/ACT SUSP Place 1 spray into the nose 2 (two) times daily.  . benzonatate (TESSALON) 200 MG capsule Take 1 capsule (200 mg total) by mouth 3 (three) times daily as needed for cough.  . chlorpheniramine (CHLOR-TRIMETON) 4 MG tablet Take 4 mg by mouth every 4 (four) hours as needed for allergies.  Marland Kitchen estradiol (VIVELLE-DOT) 0.025 MG/24HR Place 1 patch onto the skin 2 (two) times a week.    . gabapentin (NEURONTIN) 100 MG capsule TAKE 2-3 CAPSULES (200-300 MG TOTAL) BY MOUTH AT BEDTIME.  Marland Kitchen levothyroxine (SYNTHROID, LEVOTHROID) 88 MCG tablet Take 88 mcg by mouth daily.  Marland Kitchen losartan (COZAAR) 25 MG tablet Take 1 tablet (25 mg total) by mouth daily.  Marland Kitchen omeprazole (PRILOSEC) 40 MG capsule Take 30- 60 min before your first and last meals of the day (Patient taking differently: 80 mg. Take 30- 60 min before your first and last meals of  the day)  . progesterone (PROMETRIUM) 100 MG capsule Take 100 mg by mouth daily.    . [DISCONTINUED] Azelastine-Fluticasone (DYMISTA) 137-50 MCG/ACT SUSP Place 1 spray into the nose 2 (two) times daily.                   Objective:   Physical Exam  amb wf nad    10/25/2017      117  07/26/2017       114   06/28/2017         113   05/18/17 115 lb 9.6 oz (52.4 kg)  06/03/16 122 lb (55.3 kg)    Vital signs reviewed - Note on arrival 02 sats  99% on Ra   HEENT: nl dentition, turbinates bilaterally, and oropharynx. Nl external ear canals without cough reflex   NECK :  without JVD/Nodes/TM/ nl carotid upstrokes bilaterally   LUNGS: no acc muscle use,  Nl contour chest which is clear to A and P bilaterally with  cough on insp   CV:  RRR  no s3 or murmur or increase in P2, and no edema   ABD:  soft and nontender with nl inspiratory excursion in the supine position. No bruits or organomegaly appreciated, bowel sounds nl  MS:  Nl gait/ ext warm without deformities, calf tenderness, cyanosis or clubbing  No obvious joint restrictions   SKIN: warm and dry without lesions    NEURO:  alert, approp, nl sensorium with  no motor or cerebellar deficits apparent.        Assessment:

## 2017-10-25 NOTE — Patient Instructions (Addendum)
Dysmista one puff each nostril daily   For drainage / throat tickle try take CHLORPHENIRAMINE  4 mg - take one every 4 hours as needed - available over the counter- may cause drowsiness so start with just a bedtime dose or two automtically    Please see patient coordinator before you leave today  to schedule methacholine Challenge testing    Please schedule a follow up office visit in 6 weeks, call sooner if needed with all medications /inhalers/ solutions in hand so we can verify exactly what you are taking. This includes all medications from all doctors and over the [facility] separate them into two bags:  the ones you take automatically, no matter what, vs the ones you take just when you feel you need them "BAG #2 is UP TO YOU"  - this will really help [place] help you take your medications more effectively.

## 2017-10-30 ENCOUNTER — Encounter: Payer: Self-pay | Admitting: Internal Medicine

## 2017-10-30 NOTE — Assessment & Plan Note (Signed)
CT 2012 IMPRESSION:  1. Apical scarring may account for the plain film abnormality.  2. A 6 mm right upper lobe pulmonary nodule.  - - HRCT 07/12/2017 >>>  Scattered minimal cylindrical and varicoid bronchiectasis with associated scattered mucoid impaction and minimal tree-in-bud opacity in both lungs, predominantly in the mid to lower lungs. Findings are stable to slightly worsened since 2015 chest CT.   Changes are not likely enough to contribute to symptoms so rx conservatively with mucinex dm prn    I had an extended discussion with the patient reviewing all relevant studies completed to date and  lasting 15 to 20 minutes of a 25 minute visit    Each maintenance medication was reviewed in detail including most importantly the difference between maintenance and prns and under what circumstances the prns are to be triggered using an action plan format that is not reflected in the computer generated alphabetically organized AVS.    Please see AVS for specific instructions unique to this visit that I personally wrote and verbalized to the the pt in detail and then reviewed with pt  by my nurse highlighting any  changes in therapy recommended at today's visit to their plan of care.

## 2017-10-30 NOTE — Assessment & Plan Note (Signed)
CT chest 03/19/14  No bronchiectasis (not hrct)  Spirometry 05/18/2017  FEV1  2.15 (93%)  Ratio 76 s rx prior  - FENO 05/18/2017  =   Could not perform   - Allergy profile 05/18/2017 >  Eos 0.2 /  IgE  75 RAST pos cat > dog > mold - HRCT 07/12/2017 >>>  See bronchiectasis  - trial of dymista 07/26/2017 > some better but not consistent with it > rechallenge rx one puff each am 10/25/2017    - MCT oderered 10/25/2017   Although she has bronchiectasis, cough is more c/w cough variant asthma vs uacs  MCT is next step

## 2017-11-02 ENCOUNTER — Ambulatory Visit (HOSPITAL_COMMUNITY)
Admission: RE | Admit: 2017-11-02 | Discharge: 2017-11-02 | Disposition: A | Discharge status: Home / Self Care | Payer: Medicare Other | Source: Ambulatory Visit | Attending: Internal Medicine | Admitting: Internal Medicine

## 2017-11-02 DIAGNOSIS — R053 Chronic cough: Secondary | ICD-10-CM

## 2017-11-02 DIAGNOSIS — R05 Cough: Secondary | ICD-10-CM | POA: Insufficient documentation

## 2017-11-02 LAB — PULMONARY FUNCTION TEST
FEF 25-75 Post: 1.78 L/sec
FEF 25-75 Pre: 1.67 L/sec
FEF2575-%CHANGE-POST: 6 %
FEF2575-%Pred-Post: 94 %
FEF2575-%Pred-Pre: 88 %
FEV1-%CHANGE-POST: 1 %
FEV1-%PRED-PRE: 85 %
FEV1-%Pred-Post: 87 %
FEV1-PRE: 1.96 L
FEV1-Post: 1.98 L
FEV1FVC-%CHANGE-POST: 6 %
FEV1FVC-%Pred-Pre: 100 %
FEV6-%Change-Post: -4 %
FEV6-%PRED-PRE: 88 %
FEV6-%Pred-Post: 84 %
FEV6-POST: 2.44 L
FEV6-PRE: 2.56 L
FEV6FVC-%Change-Post: 0 %
FEV6FVC-%PRED-POST: 104 %
FEV6FVC-%PRED-PRE: 103 %
FVC-%Change-Post: -4 %
FVC-%PRED-POST: 81 %
FVC-%PRED-PRE: 85 %
FVC-POST: 2.46 L
FVC-PRE: 2.57 L
POST FEV6/FVC RATIO: 100 %
PRE FEV6/FVC RATIO: 99 %
Post FEV1/FVC ratio: 81 %
Pre FEV1/FVC ratio: 76 %

## 2017-11-02 MED ORDER — METHACHOLINE 0.0625 MG/ML NEB SOLN
2.0000 mL | Freq: Once | RESPIRATORY_TRACT | Status: AC
  Administered 2017-11-02: 0.125 mg via RESPIRATORY_TRACT

## 2017-11-02 MED ORDER — SODIUM CHLORIDE 0.9 % IN NEBU
3.0000 mL | INHALATION_SOLUTION | Freq: Once | RESPIRATORY_TRACT | Status: AC
  Administered 2017-11-02: 3 mL via RESPIRATORY_TRACT

## 2017-11-02 MED ORDER — METHACHOLINE 1 MG/ML NEB SOLN
2.0000 mL | Freq: Once | RESPIRATORY_TRACT | Status: AC
  Administered 2017-11-02: 2 mg via RESPIRATORY_TRACT

## 2017-11-02 MED ORDER — METHACHOLINE 4 MG/ML NEB SOLN
2.0000 mL | Freq: Once | RESPIRATORY_TRACT | Status: AC
  Administered 2017-11-02: 8 mg via RESPIRATORY_TRACT

## 2017-11-02 MED ORDER — METHACHOLINE 16 MG/ML NEB SOLN
2.0000 mL | Freq: Once | RESPIRATORY_TRACT | Status: AC
  Administered 2017-11-02: 32 mg via RESPIRATORY_TRACT

## 2017-11-02 MED ORDER — ALBUTEROL SULFATE (2.5 MG/3ML) 0.083% IN NEBU
2.5000 mg | INHALATION_SOLUTION | Freq: Once | RESPIRATORY_TRACT | Status: AC
  Administered 2017-11-02: 2.5 mg via RESPIRATORY_TRACT

## 2017-11-02 MED ORDER — METHACHOLINE 0.25 MG/ML NEB SOLN
2.0000 mL | Freq: Once | RESPIRATORY_TRACT | Status: AC
  Administered 2017-11-02: 0.5 mg via RESPIRATORY_TRACT

## 2017-11-05 ENCOUNTER — Other Ambulatory Visit: Payer: Self-pay | Admitting: Internal Medicine

## 2017-11-05 DIAGNOSIS — R053 Chronic cough: Secondary | ICD-10-CM

## 2017-11-05 DIAGNOSIS — R05 Cough: Secondary | ICD-10-CM

## 2017-12-06 ENCOUNTER — Encounter: Payer: Self-pay | Admitting: Internal Medicine

## 2017-12-06 ENCOUNTER — Ambulatory Visit: Payer: Medicare Other | Admitting: Internal Medicine

## 2017-12-06 VITALS — BP 122/80 | HR 77 | Ht 64.5 in | Wt 117.0 lb

## 2017-12-06 DIAGNOSIS — R053 Chronic cough: Secondary | ICD-10-CM

## 2017-12-06 DIAGNOSIS — J479 Bronchiectasis, uncomplicated: Secondary | ICD-10-CM

## 2017-12-06 DIAGNOSIS — R05 Cough: Secondary | ICD-10-CM

## 2017-12-06 NOTE — Progress Notes (Signed)
Subjective:     Patient ID: Yvette Short, female   DOB: [birth year],    MRN: [id number]    Brief patient profile:  30 yowf quit smoking 1976 with no resp significant symptoms but incidental  MPNS detected  by Dr Leanne Chang with no change in last f/u study  02/2014 and no dedicated f/u indicated  and since early 2016  has developed waxing and waning daily cough > gi w/u Kenton Kingfisher in [place])  ? Neg  for gerd and ent w/u neg Janace Hoard)  so referred to pulmonary clinic 05/18/2017 by Dr  Janace Hoard.  Note prev under care of Dr Delfin Edis with documented gerd/ esophagitis.    History of Present Illness  05/18/2017 1st [facility] office visit/ Yvette Short   Chief Complaint  Patient presents with  . Pulmonary Consult    Self referral. She c/o cough for the past 2-3 years. Cough is mainly non prod, but she feels like there is something in her throat.   doe = MMRC1 = can walk nl pace, flat grade, can't hurry or go uphills or steps s sob    Indolent onset variable non-productive cough day > noct  And pnds/ not typically disturbing sleep or exac in am  Kouffman Reflux v Neurogenic Cough Differentiator Reflux Comments  Do you awaken from a sound sleep coughing violently?                            With trouble breathing? Rarely no   Do you have choking episodes when you cannot  Get enough air, gasping for air ?              Occ/ better on ppi    Do you usually cough when you lie down into  The bed, or when you just lie down to rest ?                          no   Do you usually cough after meals or eating?         Worse p eat   Do you cough when (or after) you bend over?    no   GERD SCORE     Kouffman Reflux v Neurogenic Cough Differentiator Neurogenic   Do you more-or-less cough all day long? sporadic   Does change of temperature make you cough? no   Does laughing or chuckling cause you to cough? sometimes   Do fumes (perfume, automobile fumes, burned  Toast, etc.,) cause you to cough ?      musty   Does  speaking, singing, or talking on the phone cause you to cough   ?               Sometimes    Neurogenic/Airway score     rec Omeprazole 40 mg Take 30- 60 min before your first and last meals of the day  For drainage / throat tickle try take CHLORPHENIRAMINE  4 mg - take one every 4 hours as needed -  GERD diet  Consider tapering off hormone replacement or a lower dose  Try gabapentin 100 mg x 2 at bedtime and one in am if you can tolerate     06/28/2017  Extended f/u ov/Yvette Short re: refractory daily  Cough since  Around 2015  Chief Complaint  Patient presents with  . Follow-up    Cough has improved some. No new co's.  Dyspnea:  Not limited by breathing from desired activities   Cough: some times with activity/ better p meals  Sleep: fine  SABA use:  None  Dry mouth worse in am  Still on progesterone / not really using h1 much. rec For drainage / throat tickle try take CHLORPHENIRAMINE  4 mg - take one every 4 hours as needed -  GERD diet  Please see patient coordinator before you leave today  to schedule HRCT of chest> min bronchiectasis  We may need to change your blood pressure pill and get you off the progesterone since the one problem we know you have that causes cough is reflux and high doses of amlodipine and progestone both make this worse as does your coughing  If still coughing try tessalon 200 mg up to 3 x daily    07/26/2017  f/u ov/Yvette Short re: uacs x > 4 years/ min bronchiectasis  Chief Complaint  Patient presents with  . Follow-up    Cough has been worse since she had a cold a week ago. Cough is prod with very min, clear sputum.    Dyspnea:  Not limited by breathing from desired activities   Cough: worse with "cold" - tessalon did not help  Sleep: ok no noct symptoms  SABA use:  None   Says previously took nasal sprays that helped per ent / note chronic cough worse p supper, still on progesterone 100 mg daily  rec For cough >  mucinex DM up to 1200 mg up to every 12  hours as needed Try dymista one twice daily until use it up - point to ear on same side  For drainage / throat tickle continue to take CHLORPHENIRAMINE  4 mg - take one every 4 hours as needed - available over the counter- may cause drowsiness so start with just a bedtime dose or two and see how you tolerate it before trying in daytime   Try just one estrogen patch per day for 6 weeks and if do as well I would contact Dr Tressia Danas for further instructions if able to tolerate the wean Please schedule a follow up visit in 3 months but call sooner if needed  with all medications /inhalers/ solutions in hand so we can verify exactly what you are taking. This includes all medications from all doctors and over the counters    10/25/2017  f/u ov/Yvette Short re:  Cough since April 2015/ better with dymista but not  consistent with it/bronchiectasis / neg resp to pred or abx with prev w/u Janace Hoard neg  Chief Complaint  Patient presents with  . Follow-up    Cough is unchanged. No new co's. She has not used her albuterol.    Dyspnea:  Not limited by breathing from desired activities   Cough: seems worse with deep breath/ ex  Worse with certain odors  Sleeping: most nights s cough  SABA use: did not help but not sure it worked  rec Dysmista one puff each nostril daily  For drainage / throat tickle try take CHLORPHENIRAMINE  4 mg - take one every 4 hours as needed - available over the counter- may cause drowsiness so start with just a bedtime dose or two automtically  Please see patient coordinator before you leave today  to schedule methacholine Challenge testing  Please schedule a follow up office visit in 6 weeks, call sooner if needed with all medications      12/06/2017  f/u ov/Yvette Short re:  Cough x April 2015  /  MC8/7/19 borderline pos  But did not provoke symptoms  / still has globus  Chief Complaint  Patient presents with  . Follow-up    Cough is about the same, "may be a little worse". Cough is non prod.     Dyspnea:  Not limited by breathing from desired activities   Cough: was worse with antedote than with Methacholine   SABA use: never  02: no   Tolerating only 200 mg hs gabapentin still on progesterone 100 mg daily   No obvious day to day or daytime variability or assoc excess/ purulent sputum or mucus plugs or hemoptysis or cp or chest tightness, subjective wheeze or overt sinus or hb symptoms.   Sleeping ok most nocts  without nocturnal  or early am exacerbation  of respiratory  c/o's or need for noct saba. Also denies any obvious fluctuation of symptoms with weather or environmental changes or other aggravating or alleviating factors except as outlined above   No unusual exposure hx or h/o childhood pna/ asthma or knowledge of premature birth.  Current Allergies, Complete Past Medical History, Past Surgical History, Family History, and Social History were reviewed in [employer] record.  ROS  The following are not active complaints unless bolded Hoarseness, sore throat, dysphagia, dental problems, itching, sneezing,  nasal congestion or discharge of excess mucus or purulent secretions, ear ache,   fever, chills, sweats, unintended wt loss or wt gain, classically pleuritic or exertional cp,  orthopnea pnd or arm/hand swelling  or leg swelling, presyncope, palpitations, abdominal pain, anorexia, nausea, vomiting, diarrhea  or change in bowel habits or change in bladder habits, change in stools or change in urine, dysuria, hematuria,  rash, arthralgias, visual complaints, headache, numbness, weakness or ataxia or problems with walking or coordination,  change in mood or  memory.        Current Meds  Medication Sig  . albuterol (PROVENTIL HFA;VENTOLIN HFA) 108 (90 Base) MCG/ACT inhaler Inhale 2 puffs into the lungs every 4 (four) hours as needed for wheezing or shortness of breath.  Marland Kitchen amLODipine (NORVASC) 10 MG tablet Take 1 tablet (10 mg total) by mouth daily.  Marland Kitchen ascorbic  acid (VITAMIN C) 250 MG CHEW Chew 250 mg by mouth daily.  . Azelastine-Fluticasone (DYMISTA) 137-50 MCG/ACT SUSP Place 1 spray into the nose 2 (two) times daily.  . benzonatate (TESSALON) 200 MG capsule TAKE ONE CAPSULE BY MOUTH 3 TIMES A DAY AS NEEDED FOR COUGH  . chlorpheniramine (CHLOR-TRIMETON) 4 MG tablet Take 4 mg by mouth every 4 (four) hours as needed for allergies.  . Ergocalciferol (VITAMIN D2) 2000 units TABS Take 1 tablet by mouth daily.  Marland Kitchen estradiol (VIVELLE-DOT) 0.025 MG/24HR Place 1 patch onto the skin 2 (two) times a week.    . gabapentin (NEURONTIN) 100 MG capsule TAKE 2-3 CAPSULES (200-300 MG TOTAL) BY MOUTH AT BEDTIME.  Marland Kitchen levothyroxine (SYNTHROID, LEVOTHROID) 88 MCG tablet Take 88 mcg by mouth daily.  Marland Kitchen losartan (COZAAR) 25 MG tablet Take 1 tablet (25 mg total) by mouth daily.  Marland Kitchen omeprazole (PRILOSEC) 40 MG capsule Take 30- 60 min before your first and last meals of the day (Patient taking differently: 80 mg. Take 30- 60 min before your first and last meals of the day)  . progesterone (PROMETRIUM) 100 MG capsule Take 100 mg by mouth daily.              Objective:   Physical Exam   amb wf nad  12/06/2017  117  10/25/2017      117  07/26/2017       114   06/28/2017         113   05/18/17 115 lb 9.6 oz (52.4 kg)  06/03/16 122 lb (55.3 kg)     Vital signs reviewed - Note on arrival 02 sats  100% on RA     HEENT: nl dentition, turbinates bilaterally, and oropharynx. Nl external ear canals without cough reflex   NECK :  without JVD/Nodes/TM/ nl carotid upstrokes bilaterally   LUNGS: no acc muscle use,  Nl contour chest which is clear to A and P bilaterally with cough on insp   maneuvers   CV:  RRR  no s3 or murmur or increase in P2, and no edema   ABD:  soft and nontender with nl inspiratory excursion in the supine position. No bruits or organomegaly appreciated, bowel sounds nl  MS:  Nl gait/ ext warm without deformities, calf tenderness, cyanosis or  clubbing No obvious joint restrictions   SKIN: warm and dry without lesions    NEURO:  alert, approp, nl sensorium with  no motor or cerebellar deficits apparent.            Assessment:

## 2017-12-06 NOTE — Patient Instructions (Addendum)
Add gabapentin 100 mg each am and 200 mg at bedtime along with chlorophreniramine 4 mg x 2    If not happy with results then let me refer you to Dr Bettina Gavia at [facility] / voice center

## 2017-12-11 ENCOUNTER — Encounter: Payer: Self-pay | Admitting: Internal Medicine

## 2017-12-11 NOTE — Assessment & Plan Note (Signed)
CT 2012 IMPRESSION:  1. Apical scarring may account for the plain film abnormality.  2. A 6 mm right upper lobe pulmonary nodule.  - - HRCT 07/12/2017 >>>  Scattered minimal cylindrical and varicoid bronchiectasis with associated scattered mucoid impaction and minimal tree-in-bud opacity in both lungs, predominantly in the mid to lower lungs. Findings are stable to slightly worsened since 2015 chest CT.  Cough on inspiration/ dry quality not c/w bronchiectasis mechanism.

## 2017-12-11 NOTE — Assessment & Plan Note (Signed)
CT chest 03/19/14  No bronchiectasis (not hrct)  Spirometry 05/18/2017  FEV1  2.15 (93%)  Ratio 76 s rx prior  - FENO 05/18/2017  =   Could not perform   - Allergy profile 05/18/2017 >  Eos 0.2 /  IgE  75 RAST pos cat > dog > mold - HRCT 07/12/2017 >>>  See bronchiectasis - trial of dymista 07/26/2017 > some better but not consistent with it > rechallenge rx one puff each am 10/25/2017  - MCT 11/02/17 >  Nl baseline, borderline pos drop but considered neg/ did not provoke symptoms    Despite pos MCT there was no correlation with symptoms so rec continue rx as UACS/ irritable larynx syndrome with titrating am dose of gabapentin up if tol and 1st gen H1 blockers per guidelines     I had an extended discussion with the patient reviewing all relevant studies completed to date and  lasting 15 to 20 minutes of a 25 minute visit    Each maintenance medication was reviewed in detail including most importantly the difference between maintenance and prns and under what circumstances the prns are to be triggered using an action plan format that is not reflected in the computer generated alphabetically organized AVS.     Please see AVS for specific instructions unique to this visit that I personally wrote and verbalized to the the pt in detail and then reviewed with pt  by my nurse highlighting any  changes in therapy recommended at today's visit to their plan of care.

## 2017-12-29 ENCOUNTER — Ambulatory Visit (INDEPENDENT_AMBULATORY_CARE_PROVIDER_SITE_OTHER): Payer: Medicare Other | Admitting: *Deleted

## 2017-12-29 DIAGNOSIS — Z23 Encounter for immunization: Secondary | ICD-10-CM

## 2017-12-29 NOTE — Progress Notes (Addendum)
Yvette Short is a 72 y.o. female presents to the office today for Shingrix #2 injection, per physician's orders. Original order: 08/03/17 Shingrix, 0.74ml, IM was administered left deltoid today.  Pt tolerated injection well, given without incident or problem. Patient left without complaint.   [place] screening examination/treatment/procedure(s) were performed by non-physician practitioner and as supervising physician I was immediately available for consultation/collaboration.  I agree with above assessment and plan.  Electronically Signed by: Howard Pouch, DO [place] [facility] [place]

## 2018-01-31 ENCOUNTER — Ambulatory Visit: Payer: Medicare Other | Admitting: Family Medicine

## 2018-01-31 ENCOUNTER — Encounter: Payer: Self-pay | Admitting: Family Medicine

## 2018-01-31 VITALS — BP 135/80 | HR 59 | Temp 98.1°F | Resp 20 | Ht 64.5 in | Wt 118.0 lb

## 2018-01-31 DIAGNOSIS — R8290 Unspecified abnormal findings in urine: Secondary | ICD-10-CM | POA: Diagnosis not present

## 2018-01-31 DIAGNOSIS — R350 Frequency of micturition: Secondary | ICD-10-CM | POA: Diagnosis not present

## 2018-01-31 LAB — POC URINALSYSI DIPSTICK (AUTOMATED)
BILIRUBIN UA: NEGATIVE
Blood, UA: NEGATIVE
GLUCOSE UA: NEGATIVE
Ketones, UA: NEGATIVE
NITRITE UA: NEGATIVE
Protein, UA: POSITIVE — AB
Spec Grav, UA: 1.02 (ref 1.010–1.025)
Urobilinogen, UA: 0.2 E.U./dL
pH, UA: 6.5 (ref 5.0–8.0)

## 2018-01-31 MED ORDER — CEPHALEXIN 500 MG PO CAPS: 500.0000 mg | ORAL_CAPSULE | Freq: Four times a day (QID) | ORAL | 0 refills | Status: DC

## 2018-01-31 NOTE — Progress Notes (Signed)
Yvette Short , [birth year], 72 y.o., female MRN: [id number] Patient Care Team    Relationship Specialty Notifications Start End  Ma Hillock, DO PCP - General Family Medicine  08/03/17   Dian Queen, MD Consulting Physician Obstetrics and Gynecology  08/03/17   Tanda Rockers, MD Consulting Physician Pulmonary Disease  08/03/17   Lyndal Pulley, DO Consulting Physician Sports Medicine  08/03/17   Celedonio Miyamoto, MD Referring Physician Gastroenterology  08/03/17   Marica Otter, [place]  Optometry  08/03/17     Chief Complaint  Patient presents with  . Urinary Frequency    fatigue     Subjective: Pt presents for an OV with complaints of urinary frequency of 4 days duration.  Associated symptoms include dysuria, fatigue and low abd pressure. She denies fever, vomit or low back pain. She endorses chills and nausea.   Pt has tried azo to ease their symptoms. Sheh as UTI (not in system) last year and this feels the same to her.   Depression screen [facility] 2/9 08/03/2017 05/07/2013  Decreased Interest 0 0  Down, Depressed, Hopeless 0 0  PHQ - 2 Score 0 0    Allergies  Allergen Reactions  . Tetracycline Hcl Rash        Social History   Tobacco Use  . Smoking status: Former Smoker    Packs/day: 2.50    Years: 13.00    Pack years: 32.50    Types: Cigarettes    Last attempt to quit: 07/28/1974    Years since quitting: 43.5  . Smokeless tobacco: Never Used  Substance Use Topics  . Alcohol use: Yes    Alcohol/week: 2.0 standard drinks    Types: 2 Cans of beer per week   Past Medical History:  Diagnosis Date  . Abnormal colonoscopy 03/2017   decreased rectal tone and polyp; 5 year recall  . Abnormal findings on esophagogastroduodenoscopy (EGD) 02/25/2016   Z-line irrg. at 40 cm. No abnomrality of the esophagus to explain dysphagia. Esophagus dilated. H/o + H. Pylori  . Allergy   . ANXIETY 07/06/2007  . Biceps tendonitis on right   . Chicken pox   . Colon polyp 03/31/2017   hyperplastic  . Diverticulosis 03/2017   sigmoid  . Family history of breast cancer   . Family history of colon cancer   . Family history of ovarian cancer   . Frequent UTI   . GERD 11/09/2006  . H. pylori infection    h/o treated wiht prevpac  . HYPERLIPIDEMIA 11/09/2006  . Hypertension   . Migraine   . Piriformis syndrome of right side   . Stomach ulcer 2008   Past Surgical History:  Procedure Laterality Date  . LIPOMA EXCISION     of back  . TUBAL LIGATION     Family History  Problem Relation Age of Onset  . Dementia Mother   . Hyperlipidemia Mother   . Colon cancer Father 54  . Colon polyps Brother        gets colonoscopy every 2-3 years  . Hyperlipidemia Brother   . Hypertension Brother   . Uterine cancer Maternal Aunt        dx in her 74s  . Colon cancer Maternal Uncle   . Colon cancer Paternal Aunt   . Heart attack Maternal Grandfather   . Cancer Other        MGMs sister - "abdominal cancer"   Allergies as of 01/31/2018  Reactions   Tetracycline Hcl Rash          Medication List        Accurate as of 01/31/18  2:10 PM. Always use your most recent med list.          albuterol 108 (90 Base) MCG/ACT inhaler Commonly known as:  PROVENTIL HFA;VENTOLIN HFA Inhale 2 puffs into the lungs every 4 (four) hours as needed for wheezing or shortness of breath.   amLODipine 10 MG tablet Commonly known as:  NORVASC Take 1 tablet (10 mg total) by mouth daily.   ascorbic acid 250 MG Chew Commonly known as:  VITAMIN C Chew 250 mg by mouth daily.   Azelastine-Fluticasone 137-50 MCG/ACT Susp Place 1 spray into the nose 2 (two) times daily.   benzonatate 200 MG capsule Commonly known as:  TESSALON TAKE ONE CAPSULE BY MOUTH 3 TIMES A DAY AS NEEDED FOR COUGH   chlorpheniramine 4 MG tablet Commonly known as:  CHLOR-TRIMETON Take 4 mg by mouth every 4 (four) hours as needed for allergies.   estradiol 0.025 MG/24HR Commonly known as:  VIVELLE-DOT Place 1  patch onto the skin 2 (two) times a week.   gabapentin 100 MG capsule Commonly known as:  NEURONTIN TAKE 2-3 CAPSULES (200-300 MG TOTAL) BY MOUTH AT BEDTIME.   levothyroxine 88 MCG tablet Commonly known as:  SYNTHROID, LEVOTHROID Take 88 mcg by mouth daily.   losartan 25 MG tablet Commonly known as:  COZAAR Take 1 tablet (25 mg total) by mouth daily.   omeprazole 40 MG capsule Commonly known as:  PRILOSEC Take 30- 60 min before your first and last meals of the day   progesterone 100 MG capsule Commonly known as:  PROMETRIUM Take 100 mg by mouth daily.   Vitamin D2 2000 units Tabs Take 1 tablet by mouth daily.       All past medical history, surgical history, allergies, family history, immunizations andmedications were updated in the EMR today and reviewed under the history and medication portions of their EMR.     ROS: Negative, with the exception of above mentioned in HPI   Objective:  BP 135/80 (BP Location: Left Arm, Patient Position: Sitting, Cuff Size: Normal)   Pulse (!) 59   Temp 98.1 F (36.7 C)   Resp 20   Ht 5' 4.5" (1.638 m)   Wt 118 lb (53.5 kg)   SpO2 100%   BMI 19.94 kg/m  Body mass index is 19.94 kg/m. Gen: Afebrile. No acute distress. Nontoxic in appearance, well developed, well nourished.  HENT: AT. [place]. MMM Eyes:Pupils Equal Round Reactive to light, Extraocular movements intact,  Conjunctiva without redness, discharge or icterus. CV: RRR * Chest: CTAB, no wheeze or crackles.  Abd: Soft. [employer].si suprapubic pressure. ND. BS present. no Masses palpated. No rebound or guarding.  MSK: no cva tenderness. Skin: no rashes, purpura or petechiae.  Neuro: Normal gait. PERLA. EOMi. Alert. Oriented x3  Psych: Normal affect, dress and demeanor. Normal speech. Normal thought content and judgment.  No exam data present No results found. Results for orders placed or performed in visit on 01/31/18 (from the past 24 hour(s))  POCT Urinalysis Dipstick  (Automated)     Status: Abnormal   Collection Time: 01/31/18  2:06 PM  Result Value Ref Range   Color, UA yellow    Clarity, UA clear    Glucose, UA Negative Negative   Bilirubin, UA Negative    Ketones, UA Negative    Spec Grav,  UA 1.020 1.010 - 1.025   Blood, UA Negative    pH, UA 6.5 5.0 - 8.0   Protein, UA Positive (A) Negative   Urobilinogen, UA 0.2 0.2 or 1.0 E.U./dL   Nitrite, UA Negative    Leukocytes, UA Small (1+) (A) Negative    Assessment/Plan: Yvette Short is a 72 y.o. female present for OV for  Urinary frequency/Abnormal urine Exam consistent with UTI-- tx with keflex  - POCT Urinalysis Dipstick (Automated)--> + leuks, + protein - Urine Culture sent.  Rest, hydrate, azo OTC - f/u PRN  Reviewed expectations re: course of current medical issues.  Discussed self-management of symptoms.  Outlined signs and symptoms indicating need for more acute intervention.  Patient verbalized understanding and all questions were answered.  Patient received an After-Visit Summary.    Orders Placed This Encounter  Procedures  . Urine Culture  . POCT Urinalysis Dipstick (Automated)     Note is dictated utilizing voice recognition software. Although note has been proof read prior to signing, occasional typographical errors still can be missed. If any questions arise, please do not hesitate to call for verification.   electronically signed by:  Howard Pouch, DO  [place]

## 2018-01-31 NOTE — Patient Instructions (Addendum)
Start keflex today. It is every 6 hours for 7 days Plenty of rest and hydration, plenty of water, cranberry is also helpful You can use azo   Urinary Tract Infection, Adult A urinary tract infection (UTI) is an infection of any part of the urinary tract. The urinary tract includes the:  Kidneys.  Ureters.  Bladder.  Urethra.  These organs make, store, and get rid of pee (urine) in the body. Follow these instructions at home:  Take over-the-counter and prescription medicines only as told by your doctor.  If you were prescribed an antibiotic medicine, take it as told by your doctor. Do not stop taking the antibiotic even if you start to feel better.  Avoid the following drinks: ? Alcohol. ? Caffeine. ? Tea. ? Carbonated drinks.  Drink enough fluid to keep your pee clear or pale yellow.  Keep all follow-up visits as told by your doctor. This is important.  Make sure to: ? Empty your bladder often and completely. Do not to hold pee for long periods of time. ? Empty your bladder before and after sex. ? Wipe from front to back after a bowel movement if you are female. Use each tissue one time when you wipe. Contact a doctor if:  You have back pain.  You have a fever.  You feel sick to your stomach (nauseous).  You throw up (vomit).  Your symptoms do not get better after 3 days.  Your symptoms go away and then come back. Get help right away if:  You have very bad back pain.  You have very bad lower belly (abdominal) pain.  You are throwing up and cannot keep down any medicines or water. This information is not intended to replace advice given to you by your health care provider. Make sure you discuss any questions you have with your health care provider. Document Released: 09/01/2007 Document Revised: 08/21/2015 Document Reviewed: 02/03/2015 Elsevier Interactive Patient Education  [employer].

## 2018-02-01 LAB — URINE CULTURE
MICRO NUMBER:: 91331145
SPECIMEN QUALITY:: ADEQUATE

## 2018-02-08 ENCOUNTER — Ambulatory Visit: Payer: Medicare Other | Admitting: Family Medicine

## 2018-02-08 ENCOUNTER — Encounter: Payer: Self-pay | Admitting: Family Medicine

## 2018-02-08 VITALS — BP 126/61 | HR 68 | Temp 98.2°F | Resp 18 | Ht 64.5 in | Wt 116.0 lb

## 2018-02-08 DIAGNOSIS — E782 Mixed hyperlipidemia: Secondary | ICD-10-CM

## 2018-02-08 DIAGNOSIS — I10 Essential (primary) hypertension: Secondary | ICD-10-CM

## 2018-02-08 DIAGNOSIS — I700 Atherosclerosis of aorta: Secondary | ICD-10-CM

## 2018-02-08 DIAGNOSIS — I2510 Atherosclerotic heart disease of native coronary artery without angina pectoris: Secondary | ICD-10-CM

## 2018-02-08 HISTORY — DX: Atherosclerotic heart disease of native coronary artery without angina pectoris: I25.10

## 2018-02-08 HISTORY — DX: Atherosclerosis of aorta: I70.0

## 2018-02-08 LAB — LIPID PANEL
CHOL/HDL RATIO: 3
Cholesterol: 238 mg/dL — ABNORMAL HIGH (ref 0–200)
HDL: 74.2 mg/dL (ref >39.00)
LDL Cholesterol: 132 mg/dL — ABNORMAL HIGH (ref 0–99)
NONHDL: 163.54
TRIGLYCERIDES: 160 mg/dL — AB (ref 0.0–149.0)
VLDL: 32 mg/dL (ref 0.0–40.0)

## 2018-02-08 MED ORDER — OMEPRAZOLE 40 MG PO CPDR: 40.0000 mg | DELAYED_RELEASE_CAPSULE | Freq: Every day | ORAL | 3 refills | Status: DC

## 2018-02-08 MED ORDER — LOSARTAN POTASSIUM 25 MG PO TABS: 25.0000 mg | ORAL_TABLET | Freq: Every day | ORAL | 1 refills | Status: DC

## 2018-02-08 MED ORDER — AMLODIPINE BESYLATE 10 MG PO TABS: 10.0000 mg | ORAL_TABLET | Freq: Every day | ORAL | 1 refills | Status: DC

## 2018-02-08 NOTE — Patient Instructions (Signed)
Zico coconut water and warm socks to help with night cramps.   Your blood pressure looks great.   I have refilled your meds today.  Please help [place] help you:  We are honored you have chosen [facility] for your Primary Care home. Below you will find basic instructions that you may need to access in the future. Please help [place] help you by reading the instructions, which cover many of the frequent questions we experience.   Prescription refills and request:  -In order to allow more efficient response time, please call your pharmacy for all refills. They will forward the request electronically to [place]. This allows for the quickest possible response. Request left on a nurse line can take longer to refill, since these are checked as time allows between office patients and other phone calls.  - refill request can take up to 3-5 working days to complete.  - If request is sent electronically and request is appropiate, it is usually completed in 1-2 business days.  - all patients will need to be seen routinely for all chronic medical conditions requiring prescription medications (see follow-up below). If you are overdue for follow up on your condition, you will be asked to make an appointment and we will call in enough medication to cover you until your appointment (up to 30 days).  - all controlled substances will require a face to face visit to request/refill.  - if you desire your prescriptions to go through a new pharmacy, and have an active script at original pharmacy, you will need to call your pharmacy and have scripts transferred to new pharmacy. This is completed between the pharmacy locations and not by your provider.    Results: If any images or labs were ordered, it can take up to 1 week to get results depending on the test ordered and the lab/facility running and resulting the test. - Normal or stable results, which do not need further discussion, may be released to your mychart immediately  with attached note to you. A call may not be generated for normal results. Please make certain to sign up for mychart. If you have questions on how to activate your mychart you can call the front office.  - If your results need further discussion, our office will attempt to contact you via phone, and if unable to reach you after 2 attempts, we will release your abnormal result to your mychart with instructions.  - All results will be automatically released in mychart after 1 week.  - Your provider will provide you with explanation and instruction on all relevant material in your results. Please keep in mind, results and labs may appear confusing or abnormal to the untrained eye, but it does not mean they are actually abnormal for you personally. If you have any questions about your results that are not covered, or you desire more detailed explanation than what was provided, you should make an appointment with your provider to do so.   Our office handles many outgoing and incoming calls daily. If we have not contacted you within 1 week about your results, please check your mychart to see if there is a message first and if not, then contact our office.  In helping with this matter, you help decrease call volume, and therefore allow [place] to be able to respond to patients needs more efficiently.   Acute office visits (sick visit):  An acute visit is intended for a new problem and are scheduled in shorter  time slots to allow schedule openings for patients with new problems. This is the appropriate visit to discuss a new problem. Problems will not be addressed by phone call or Echart message. Appointment is needed if requesting treatment. In order to provide you with excellent quality medical care with proper time for you to explain your problem, have an exam and receive treatment with instructions, these appointments should be limited to one new problem per visit. If you experience a new problem, in which you  desire to be addressed, please make an acute office visit, we save openings on the schedule to accommodate you. Please do not save your new problem for any other type of visit, let us take care of it properly and quickly for you.   Follow up visits:  Depending on your condition(s) your provider will need to see you routinely in order to provide you with quality care and prescribe medication(s). Most chronic conditions (Example: hypertension, Diabetes, depression/anxiety... etc), require visits a couple times a year. Your provider will instruct you on proper follow up for your personal medical conditions and history. Please make certain to make follow up appointments for your condition as instructed. Failing to do so could result in lapse in your medication treatment/refills. If you request a refill, and are overdue to be seen on a condition, we will always provide you with a 30 day script (once) to allow you time to schedule.    Medicare wellness (well visit): - we have a wonderful Nurse Maudie Mercury), that will meet with you and provide you will yearly medicare wellness visits. These visits should occur yearly (can not be scheduled less than 1 calendar year apart) and cover preventive health, immunizations, advance directives and screenings you are entitled to yearly through your medicare benefits. Do not miss out on your entitled benefits, this is when medicare will pay for these benefits to be ordered for you.  These are strongly encouraged by your provider and is the appropriate type of visit to make certain you are up to date with all preventive health benefits. If you have not had your medicare wellness exam in the last 12 months, please make certain to schedule one by calling the office and schedule your medicare wellness with Maudie Mercury as soon as possible.   Yearly physical (well visit):  - Adults are recommended to be seen yearly for physicals. Check with your insurance and date of your last physical, most  insurances require one calendar year between physicals. Physicals include all preventive health topics, screenings, medical exam and labs that are appropriate for gender/age and history. You may have fasting labs needed at this visit. This is a well visit (not a sick visit), new problems should not be covered during this visit (see acute visit).  - Pediatric patients are seen more frequently when they are younger. Your provider will advise you on well child visit timing that is appropriate for your their age. - This is not a medicare wellness visit. Medicare wellness exams do not have an exam portion to the visit. Some medicare companies allow for a physical, some do not allow a yearly physical. If your medicare allows a yearly physical you can schedule the medicare wellness with our nurse Maudie Mercury and have your physical with your provider after, on the same day. Please check with insurance for your full benefits.   Late Policy/No Shows:  - all new patients should arrive 15-30 minutes earlier than appointment to allow [place] time  to  obtain all  personal demographics,  insurance information and for you to complete office paperwork. - All established patients should arrive 10-15 minutes earlier than appointment time to update all information and be checked in .  - In our best efforts to run on time, if you are late for your appointment you will be asked to either reschedule or if able, we will work you back into the schedule. There will be a wait time to work you back in the schedule,  depending on availability.  - If you are unable to make it to your appointment as scheduled, please call 24 hours ahead of time to allow [place] to fill the time slot with someone else who needs to be seen. If you do not cancel your appointment ahead of time, you may be charged a no show fee.

## 2018-02-08 NOTE — Progress Notes (Signed)
Patient ID: Yvette Short, female  DOB: December 20, 1945, 72 y.o.   MRN: [id number] Patient Care Team    Relationship Specialty Notifications Start End  Ma Hillock, DO PCP - General Family Medicine  08/03/17   Dian Queen, MD Consulting Physician Obstetrics and Gynecology  08/03/17   Tanda Rockers, MD Consulting Physician Pulmonary Disease  08/03/17   Lyndal Pulley, DO Consulting Physician Sports Medicine  08/03/17   Celedonio Miyamoto, MD Referring Physician Gastroenterology  08/03/17   Marica Otter, [place]  Optometry  08/03/17     Chief Complaint  Patient presents with  . Hypertension    Subjective:  Yvette Short is a 72 y.o.  female present for follow up HTN  Essential hypertension/hyperlipidemia Pt reports compliance with Amlodipine 10 mg Qd and losartan 25 mg QD. She is tolerating medication well, it has not made her chronic cough any worse. Patient denies chest pain, shortness of breath, dizziness or lower extremity edema.  She has a chronic palpitations.  BMP: 08/03/2017 WNL CBC: 05/18/2017 WNL Lipid: 02/25/2017 Ch 236, HDL 96, LDL 121, tg 89--> repeated today TSH: 08/03/2017 WNL Diet: low sodium Exercise: routine exercise RF: htn, hld, former smoker, fhx HD EKG 2012: Multiple PAC appreciated.  08/10/2017 EKG: SR. HR 63. Frequent PAC, PR 136 QT 442. Unchanged from prior EKG.   Chronic cough/Pharyngoesophageal dysphagia/Bronchiectasis without complication ([place]) Evaluated by Pulm (Dr. Melvyn Novas), ENT and had EGD 2017 by Dr. Kenton Kingfisher. Pt reports she has had a chronic cough since before 2016. She was originally started omeprazole after EGD. She was told she has a leaky stomach valve, but no barrett's esophagus. Her omeprazole was increased to 80 mg a day by Dr. Melvyn Novas. He also recommended she increase gabapentin to BID, but she was unable to tolerate increase dose. She has been prescribed tessalon perles. Pt reports she still does feel her symptoms are worse after dinner. She has  occassions over the last few weeks in which food becomes stuck or slow transit to stomach. Her colonoscopy is UTD completed 03/2017 with 1 polyp. 5 year recall. She does not particularly want to take high dose PPI. She is a former smoker. She has frequent attacks after exercise as well.   Ct Chest High Resolution Result Date: 07/12/2017--> IMPRESSION: 1. Scattered minimal cylindrical and varicoid bronchiectasis with associated scattered mucoid impaction and minimal tree-in-bud opacity in both lungs, predominantly in the mid to lower lungs. Findings are stable to slightly worsened since 2015 chest CT. Findings may be due to chronic infectious bronchiolitis from atypical mycobacterial infection (MAI). 2. Two vessel coronary atherosclerosis. Aortic Atherosclerosis (ICD10-I70.0). Electronically Signed   By: Ilona Sorrel M.D.   On: 07/12/2017 12:56  Depression screen [facility] 2/9 08/03/2017 05/07/2013  Decreased Interest 0 0  Down, Depressed, Hopeless 0 0  PHQ - 2 Score 0 0   No flowsheet data found.     Fall Risk  08/03/2017 05/07/2013  Falls in the past year? No No    Immunization History  Administered Date(s) Administered  . Influenza Split 12/21/2010, 12/21/2011  . Influenza Whole 01/04/2008, 12/31/2008, 01/14/2010  . Influenza, High Dose Seasonal PF 01/03/2013, 12/26/2013, 02/12/2016, 12/28/2016  . Influenza-Unspecified 12/06/2017  . Pneumococcal Conjugate-13 02/16/2016  . Pneumococcal Polysaccharide-23 04/10/2012  . Td 03/09/2001, 12/10/2008  . Tdap 12/10/2008, 12/28/2016  . Zoster 12/21/2010  . Zoster Recombinat (Shingrix) 08/03/2017, 12/29/2017    No exam data present  Past Medical History:  Diagnosis Date  . Abnormal colonoscopy  03/2017   decreased rectal tone and polyp; 5 year recall  . Abnormal findings on esophagogastroduodenoscopy (EGD) 02/25/2016   Z-line irrg. at 40 cm. No abnomrality of the esophagus to explain dysphagia. Esophagus dilated. H/o + H. Pylori  . Allergy   . ANXIETY  07/06/2007  . Biceps tendonitis on right   . Chicken pox   . Colon polyp 03/31/2017   hyperplastic  . Diverticulosis 03/2017   sigmoid  . Family history of breast cancer   . Family history of colon cancer   . Family history of ovarian cancer   . Frequent UTI   . GERD 11/09/2006  . H. pylori infection    h/o treated wiht prevpac  . HYPERLIPIDEMIA 11/09/2006  . Hypertension   . Migraine   . Piriformis syndrome of right side   . Stomach ulcer 2008   Allergies  Allergen Reactions  . Tetracycline Hcl Rash        Past Surgical History:  Procedure Laterality Date  . LIPOMA EXCISION     of back  . TUBAL LIGATION     Family History  Problem Relation Age of Onset  . Dementia Mother   . Hyperlipidemia Mother   . Colon cancer Father 15  . Colon polyps Brother        gets colonoscopy every 2-3 years  . Hyperlipidemia Brother   . Hypertension Brother   . Uterine cancer Maternal Aunt        dx in her 1s  . Colon cancer Maternal Uncle   . Colon cancer Paternal Aunt   . Heart attack Maternal Grandfather   . Cancer Other        MGMs sister - "abdominal cancer"   Social History   Socioeconomic History  . Marital status: Married    Spouse name: Not on file  . Number of children: Not on file  . Years of education: Not on file  . Highest education level: Not on file  Occupational History  . Not on file  Social Needs  . Financial resource strain: Not on file  . Food insecurity:    Worry: Not on file    Inability: Not on file  . Transportation needs:    Medical: Not on file    Non-medical: Not on file  Tobacco Use  . Smoking status: Former Smoker    Packs/day: 2.50    Years: 13.00    Pack years: 32.50    Types: Cigarettes    Last attempt to quit: 07/28/1974    Years since quitting: 43.5  . Smokeless tobacco: Never Used  Substance and Sexual Activity  . Alcohol use: Yes    Alcohol/week: 2.0 standard drinks    Types: 2 Cans of beer per week  . Drug use: No  . Sexual  activity: Yes    Partners: Male  Lifestyle  . Physical activity:    Days per week: Not on file    Minutes per session: Not on file  . Stress: Not on file  Relationships  . Social connections:    Talks on phone: Not on file    Gets together: Not on file    Attends religious service: Not on file    Active member of club or organization: Not on file    Attends meetings of clubs or organizations: Not on file    Relationship status: Not on file  . Intimate partner violence:    Fear of current or ex partner: Not on file  Emotionally abused: Not on file    Physically abused: Not on file    Forced sexual activity: Not on file  Other Topics Concern  . Not on file  Social History Narrative   Married. Two children.    College grad.    Former smoker.    Exercises routinely.    Drink caffeine.    Wears a hearing aid.    Smoke alarm in the home.    Wears her seatbelt.    Feels safe in her relationships.    Allergies as of 02/08/2018      Reactions   Tetracycline Hcl Rash          Medication List        Accurate as of 02/08/18  9:11 AM. Always use your most recent med list.          albuterol 108 (90 Base) MCG/ACT inhaler Commonly known as:  PROVENTIL HFA;VENTOLIN HFA Inhale 2 puffs into the lungs every 4 (four) hours as needed for wheezing or shortness of breath.   amLODipine 10 MG tablet Commonly known as:  NORVASC Take 1 tablet (10 mg total) by mouth daily.   ascorbic acid 250 MG Chew Commonly known as:  VITAMIN C Chew 250 mg by mouth daily.   Azelastine-Fluticasone 137-50 MCG/ACT Susp Place 1 spray into the nose 2 (two) times daily.   chlorpheniramine 4 MG tablet Commonly known as:  CHLOR-TRIMETON Take 4 mg by mouth every 4 (four) hours as needed for allergies.   gabapentin 100 MG capsule Commonly known as:  NEURONTIN TAKE 2-3 CAPSULES (200-300 MG TOTAL) BY MOUTH AT BEDTIME.   levothyroxine 88 MCG tablet Commonly known as:  SYNTHROID, LEVOTHROID Take 88  mcg by mouth daily.   losartan 25 MG tablet Commonly known as:  COZAAR Take 1 tablet (25 mg total) by mouth daily.   omeprazole 40 MG capsule Commonly known as:  PRILOSEC Take 1-2 capsules (40-80 mg total) by mouth daily. Take 30- 60 min before your first and last meals of the day   Vitamin D2 50 MCG (2000 UT) Tabs Take 1 tablet by mouth daily.       All past medical history, surgical history, allergies, family history, immunizations andmedications were updated in the EMR today and reviewed under the history and medication portions of their EMR.      ROS: 14 pt review of systems performed and negative (unless mentioned in an HPI)  Objective: BP 126/61 (BP Location: Left Arm, Patient Position: Sitting, Cuff Size: Normal)   Pulse 68   Temp 98.2 F (36.8 C) (Oral)   Resp 18   Ht 5' 4.5" (1.638 m)   Wt 116 lb (52.6 kg)   SpO2 100%   BMI 19.60 kg/m   Gen: Afebrile. No acute distress. Pleasant caucasian female. No cough.  HENT: AT. [place].  MMM.  Eyes:Pupils Equal Round Reactive to light, Extraocular movements intact,  Conjunctiva without redness, discharge or icterus. Neck/lymp/endocrine: Supple,no lymphadenopathy, no thyromegaly CV: RRR no murmur, no edema, +2/4 P posterior tibialis pulses. No Carotid bruit.  Chest: CTAB, no wheeze or crackles Abd: Soft. NTND. BS present Neuro: Normal gait. PERLA. EOMi. Alert. Oriented.  Psych: Normal affect, dress and demeanor. Normal speech. Normal thought content and judgment.   Assessment/plan: Yvette Short is a 72 y.o. female present for follow up.  Essential hypertension/hyperlipidemia/palpitations/aortic ad coronary  atherosclerosis  - stable. Refills provided on Losartan (cough present for losartan- and unchanged since) and amlodipine 10 mg QD.  -  BMP: 08/03/2017 WNL - CBC: 05/18/2017 WNL - Lipid: 02/25/2017 Ch 236, HDL 96, LDL 121, tg 89--> collected today - TSH: 08/03/2017 WNL - Diet: low sodium - Exercise: routine exercise -  F/U 6 months.    Chronic cough/Pharyngoesophageal dysphagia/Bronchiectasis without complication ([facility]) - chronic cough started ~2016, on high dose omeprazole. Last EGD 2017, Dr. Kenton Kingfisher - pulmonology (Dr. Melvyn Novas) did not feel it was caused by lung condition. Ct abnormal w/ tree in bud.  - Continue PPI for now. [facility] and Dr. Collene Mares office did not accept her bc they did not feel they could offer her more. Encouraged her to contiue to follow with Dr. Kenton Kingfisher and if needed could try to refer to [facility] for another opinion or [facility].  - try albuterol before exercise. Frequent cough attacks with exercise and after dinner.  - F/U PRN  Return in about 6 months (around 08/09/2018) for HTN/.   Note is dictated utilizing voice recognition software. Although note has been proof read prior to signing, occasional typographical errors still can be missed. If any questions arise, please do not hesitate to call for verification.  Electronically signed by: Howard Pouch, DO [place]

## 2018-02-09 ENCOUNTER — Telehealth: Payer: Self-pay | Admitting: Family Medicine

## 2018-02-09 MED ORDER — ATORVASTATIN CALCIUM 20 MG PO TABS: 20.0000 mg | ORAL_TABLET | Freq: Every day | ORAL | 3 refills | Status: DC

## 2018-02-09 NOTE — Telephone Encounter (Signed)
Please inform patient the following information: Her cholesterol panel result showed total cholesterol 238, good cholesterol (HDL) 74, bad cholesterol (LDL)132. Her CV risk score is 13.6%, which is her risk for heart attack or stroke within the next 10 years.   CV risk score is calculated by her specific age, BP, cholesterol level and medical history. American heart association recommendations anyone with a CV risk > 10% be started on a statin medication to both lower cholesterol and provided the added benefit of helping stabilizing the blood vessel walls to prevent stroke/heart attack- this benefit is only provided by a statin med.   - of course routine exercise, diet lower in saturated/trans fats and higher fiber can also be helpful.   If she is agreeable to start a statin, I will call it in to her pharmacy-- and please schedule her for 3 mos for recheck with provider.

## 2018-02-09 NOTE — Telephone Encounter (Signed)
Prescribed Lipitor by [facility] recommendations for elevated CV risk.

## 2018-02-09 NOTE — Telephone Encounter (Signed)
Detailed message left with husband, he will have her to return call and inform us if she wants to start medication for elevated cholesterol levels and risk factor.

## 2018-02-09 NOTE — Telephone Encounter (Signed)
Patient agreeable to begin statin.  Not happily but agreeable.  Please send to [facility].

## 2018-02-15 ENCOUNTER — Telehealth: Payer: Self-pay | Admitting: Family Medicine

## 2018-02-15 ENCOUNTER — Encounter: Payer: Self-pay | Admitting: Family Medicine

## 2018-02-15 NOTE — Telephone Encounter (Signed)
Message left with husband for patient to return call. Okay for [facility] nurse to give information. See note

## 2018-02-15 NOTE — Telephone Encounter (Signed)
Pt sent an echart message questioning the need for statin because of her ratio calculation by lab she viewed on her mychart results (please see enote).  My recommendations are the same and are based on the [employer] recommendations of her personal calculated CV risk score.  One is a general lab calculation, the other is tailored to her personally. I hope that helps clarify the difference for her.  Pasted below if need to reference: Her cholesterol panel result showed total cholesterol 238, good cholesterol (HDL) 74, bad cholesterol (LDL)132. Her CV risk score is 13.6%, which is her risk for heart attack or stroke within the next 10 years.   CV risk score is calculated by her specific age, BP, cholesterol level and medical history. American heart association recommendations anyone with a CV risk > 10% be started on a statin medication to both lower cholesterol and provided the added benefit of helping stabilizing the blood vessel walls to prevent stroke/heart attack- this benefit is only provided by a statin med.   - of course routine exercise, diet lower in saturated/trans fats and higher fiber can also be helpful.

## 2018-02-15 NOTE — Telephone Encounter (Signed)
Message from Dr. Raoul Pitch read to patient; verbalizes understanding. Pt states since taking Lipitor (Started 02/09/18) she has increased fatigue, "Feel like I'm dragging." Pt asking if she should give this medication "More time." Please advise: [phone]

## 2018-02-16 NOTE — Telephone Encounter (Signed)
Spoke with patient's husband let him know patient can continue lipitor if patient has continued symptoms she can call and set up appt for evaluation . Patient's husband verbalized understanding.

## 2018-02-16 NOTE — Telephone Encounter (Signed)
Patient notified, see note. 

## 2018-03-31 ENCOUNTER — Other Ambulatory Visit: Payer: Self-pay | Admitting: Obstetrics and Gynecology

## 2018-03-31 DIAGNOSIS — Z1231 Encounter for screening mammogram for malignant neoplasm of breast: Secondary | ICD-10-CM

## 2018-05-11 ENCOUNTER — Ambulatory Visit
Admission: RE | Admit: 2018-05-11 | Discharge: 2018-05-11 | Disposition: A | Discharge status: Home / Self Care | Payer: Medicare Other | Source: Ambulatory Visit | Attending: Obstetrics and Gynecology | Admitting: Obstetrics and Gynecology

## 2018-05-11 DIAGNOSIS — Z1231 Encounter for screening mammogram for malignant neoplasm of breast: Secondary | ICD-10-CM

## 2018-05-26 ENCOUNTER — Other Ambulatory Visit: Payer: Self-pay | Admitting: Family Medicine

## 2018-08-09 ENCOUNTER — Other Ambulatory Visit: Payer: Self-pay

## 2018-08-09 ENCOUNTER — Ambulatory Visit (INDEPENDENT_AMBULATORY_CARE_PROVIDER_SITE_OTHER): Payer: Medicare Other | Admitting: Family Medicine

## 2018-08-09 ENCOUNTER — Encounter: Payer: Self-pay | Admitting: Family Medicine

## 2018-08-09 VITALS — HR 60 | Ht 64.5 in | Wt 116.0 lb

## 2018-08-09 DIAGNOSIS — R05 Cough: Secondary | ICD-10-CM

## 2018-08-09 DIAGNOSIS — E039 Hypothyroidism, unspecified: Secondary | ICD-10-CM

## 2018-08-09 DIAGNOSIS — I2510 Atherosclerotic heart disease of native coronary artery without angina pectoris: Secondary | ICD-10-CM | POA: Diagnosis not present

## 2018-08-09 DIAGNOSIS — I700 Atherosclerosis of aorta: Secondary | ICD-10-CM

## 2018-08-09 DIAGNOSIS — E782 Mixed hyperlipidemia: Secondary | ICD-10-CM

## 2018-08-09 DIAGNOSIS — J479 Bronchiectasis, uncomplicated: Secondary | ICD-10-CM

## 2018-08-09 DIAGNOSIS — R1314 Dysphagia, pharyngoesophageal phase: Secondary | ICD-10-CM

## 2018-08-09 DIAGNOSIS — I10 Essential (primary) hypertension: Secondary | ICD-10-CM

## 2018-08-09 DIAGNOSIS — R053 Chronic cough: Secondary | ICD-10-CM

## 2018-08-09 DIAGNOSIS — K219 Gastro-esophageal reflux disease without esophagitis: Secondary | ICD-10-CM

## 2018-08-09 MED ORDER — LOSARTAN POTASSIUM 25 MG PO TABS: 25.0000 mg | ORAL_TABLET | Freq: Every day | ORAL | 1 refills | Status: DC

## 2018-08-09 MED ORDER — ATORVASTATIN CALCIUM 20 MG PO TABS: 20.0000 mg | ORAL_TABLET | Freq: Every day | ORAL | 3 refills | Status: DC

## 2018-08-09 MED ORDER — AMLODIPINE BESYLATE 10 MG PO TABS: 10.0000 mg | ORAL_TABLET | Freq: Every day | ORAL | 1 refills | Status: DC

## 2018-08-09 NOTE — Progress Notes (Signed)
VIRTUAL VISIT VIA VIDEO  I connected with Yvette Short on 08/09/18 at  8:40 AM EDT by a video enabled telemedicine application and verified that I am speaking with the correct person using two identifiers. Location patient: Home Location provider: [facility], Office Persons participating in the virtual visit: Patient, Dr. Raoul Pitch and R.Baker, LPN  I discussed the limitations of evaluation and management by telemedicine and the availability of in person appointments. The patient expressed understanding and agreed to proceed.   SUBJECTIVE Chief Complaint  Patient presents with  . Hypertension    Pt is doing well with no complaints. Pt does not check BP at home.     HPI:  Essential hypertension/hyperlipidemia/statin use Pt reports ccompliance with Amlodipine 10 mg Qd and losartan 25 mg QD. She is tolerating medication well, it has not made her chronic cough any worse.Patient denies chest pain, shortness of breath, dizziness or lower extremity edema.  She has a chronic palpitations.  BMP: 08/03/2017 WNL CBC: 05/18/2017 WNL Lipid: 02/08/2018 total cholesterol 238, HDL 74, LDL 132, triglycerides 160-started statin after this collection. TSH: 08/03/2017 WNL Diet: low sodium Exercise: routine exercise RF: htn, hld, former smoker, fhx HD EKG 2012: Multiple PAC appreciated.  08/10/2017 EKG: SR. HR 63. Frequent PAC, PR 136 QT 442. Unchanged from prior EKG.   Chronic cough/Pharyngoesophageal dysphagia/Bronchiectasis without complication ([place]) Evaluated by Pulm (Dr. Melvyn Novas), ENT and had EGD 2017 by Dr. Kenton Kingfisher. Pt reports she has had a chronic cough since before 2016. She was originally started omeprazole after EGD. She was told she has a leaky stomach valve, but no barrett's esophagus. Her omeprazole was increased to 80 mg a day by Dr. Melvyn Novas. He also recommended she increase gabapentin to BID, but she was unable to tolerate increase dose. She has been prescribed tessalon perles.  She has  had occassions  in which food becomes stuck or slow transit to stomach. Her colonoscopy is UTD completed 03/2017 with 1 polyp. 5 year recall.  She is a former smoker. She has frequent attacks after exercise as well.  - She is now off PPI and prescribed carafate>> which she feels is working   Ct Chest High Resolution Result Date: 07/12/2017--> IMPRESSION: 1. Scattered minimal cylindrical and varicoid bronchiectasis with associated scattered mucoid impaction and minimal tree-in-bud opacity in both lungs, predominantly in the mid to lower lungs. Findings are stable to slightly worsened since 2015 chest CT. Findings may be due to chronic infectious bronchiolitis from atypical mycobacterial infection (MAI). 2. Two vessel coronary atherosclerosis. Aortic Atherosclerosis (ICD10-I70.0). Electronically Signed   By: Ilona Sorrel M.D.   On: 07/12/2017 12:56  ROS: See pertinent positives and negatives per HPI.  Patient Active Problem List   Diagnosis Date Noted  . Aortic atherosclerosis ([place]) 02/08/2018  . Atherosclerosis of native coronary artery of native heart without angina pectoris 02/08/2018  . Hypothyroidism 08/03/2017  . Pharyngoesophageal dysphagia 08/03/2017  . Chronic cough 05/18/2017  . Genetic testing 03/26/2016  . Spinal stenosis, lumbar region, with neurogenic claudication 10/16/2014  . Lumbar radiculopathy 07/24/2014  . Bronchiectasis without complication ([place]) 14/78/2956  . Essential hypertension 12/21/2010  . Hyperlipidemia 11/09/2006  . GERD 11/09/2006    Social History   Tobacco Use  . Smoking status: Former Smoker    Packs/day: 2.50    Years: 13.00    Pack years: 32.50    Types: Cigarettes    Last attempt to quit: 07/28/1974    Years since quitting: 44.0  . Smokeless tobacco: Never  Used  Substance Use Topics  . Alcohol use: Yes    Alcohol/week: 2.0 standard drinks    Types: 2 Cans of beer per week    Current Outpatient Medications:  .  albuterol (PROVENTIL HFA;VENTOLIN  HFA) 108 (90 Base) MCG/ACT inhaler, Inhale 2 puffs into the lungs every 4 (four) hours as needed for wheezing or shortness of breath., Disp: 1 each, Rfl: 6 .  amLODipine (NORVASC) 10 MG tablet, Take 1 tablet (10 mg total) by mouth daily., Disp: 90 tablet, Rfl: 1 .  ascorbic acid (VITAMIN C) 250 MG CHEW, Chew 250 mg by mouth daily., Disp: , Rfl:  .  atorvastatin (LIPITOR) 20 MG tablet, Take 1 tablet (20 mg total) by mouth daily., Disp: 90 tablet, Rfl: 3 .  Azelaic Acid 15 % cream, APPLY TO AFFECTED AREA ON FACE UP TO TWO TIMES A DAY AS NEEDED, Disp: , Rfl:  .  chlorpheniramine (CHLOR-TRIMETON) 4 MG tablet, Take 4 mg by mouth every 4 (four) hours as needed for allergies., Disp: , Rfl:  .  Ergocalciferol (VITAMIN D2) 2000 units TABS, Take 1 tablet by mouth daily., Disp: , Rfl:  .  gabapentin (NEURONTIN) 100 MG capsule, TAKE 2-3 CAPSULES (200-300 MG TOTAL) BY MOUTH AT BEDTIME., Disp: 270 capsule, Rfl: 1 .  levothyroxine (SYNTHROID, LEVOTHROID) 88 MCG tablet, Take 88 mcg by mouth daily., Disp: , Rfl: 3 .  losartan (COZAAR) 25 MG tablet, Take 1 tablet (25 mg total) by mouth daily., Disp: 90 tablet, Rfl: 1 .  progesterone (PROMETRIUM) 100 MG capsule, TAKE 2 CAPSULE(S) EVERY DAY BY ORAL ROUTE FOR 12 DAYS., Disp: , Rfl:  .  sucralfate (CARAFATE) 1000 MG/10ML SUSP, Take 10 mg/kg by mouth every 6 (six) hours., Disp: , Rfl:  .  Azelastine-Fluticasone (DYMISTA) 137-50 MCG/ACT SUSP, Place 1 spray into the nose 2 (two) times daily. (Patient not taking: Reported on 08/09/2018), Disp: 3 Bottle, Rfl: 3 .  estradiol (VIVELLE-DOT) 0.05 MG/24HR patch, estradiol 0.05 mg/24 hr semiweekly transdermal patch  APPLY 1 PATCH TWICE A WEEK, Disp: , Rfl:  .  omeprazole (PRILOSEC) 40 MG capsule, Take 1-2 capsules (40-80 mg total) by mouth daily. Take 30- 60 min before your first and last meals of the day (Patient not taking: Reported on 08/09/2018), Disp: 180 capsule, Rfl: 3  Allergies  Allergen Reactions  . Tetracycline Hcl Rash          OBJECTIVE: Pulse 60   Ht 5' 4.5" (1.638 m)   Wt 116 lb (52.6 kg)   BMI 19.60 kg/m  Gen: No acute distress. Nontoxic in appearance.  HENT: AT. [place].  MMM.  Eyes:Pupils Equal Round Reactive to light, Extraocular movements intact,  Conjunctiva without redness, discharge or icterus. CV: no edema Chest: Cough (chronic reported) not present on exam. No shortness of breath Neuro:  Normal gait. Alert. Oriented x3  Psych: Normal affect, dress and demeanor. Normal speech. Normal thought content and judgment.  ASSESSMENT AND PLAN: SHOSHANAH DAPPER is a 73 y.o. female present for  Essential hypertension/hyperlipidemia/palpitations/aortic ad coronary  atherosclerosis  - stable. VS check on lab appt this week.  - Refills provided on Losartan (cough present before losartan- and unchanged since) and amlodipine 10 mg QD.  - continue statin - CMP: 08/03/2017 WNL>>ordered - CBC: 05/18/2017 WNL>> ordered - Lipid: 01/2018 --> started statin>> repeat today - TSH: 08/03/2017 WNL>> ordered (med managed by gyn- send results to [facility]) - Diet: low sodium - Exercise: routine exercise - F/U 6 months.    Chronic  cough/Pharyngoesophageal dysphagia/Bronchiectasis without complication ([facility]) - chronic cough started ~2016, on high dose omeprazole. Last EGD 2017, Dr. Kenton Kingfisher - pulmonology (Dr. Melvyn Novas) did not feel it was caused by lung condition. Ct abnormal w/ tree in bud.  - [facility] and Dr. Collene Mares office did not accept her bc they did not feel they could offer her more. -  Encouraged her to contiue to follow with Dr. Kenton Kingfisher and if needed could try to refer to [facility] for another opinion or [facility]>>> now on carafate and seems to be helping.  - try albuterol before exercise. Frequent cough attacks with exercise and after dinner.  - F/U PRN  Return in about 6 months (around 08/09/2018) for HTN/   > 25 minutes spent with patient, >50% of time spent face to face counseling     Howard Pouch, DO  08/09/2018

## 2018-08-11 ENCOUNTER — Encounter: Payer: Self-pay | Admitting: Family Medicine

## 2018-08-11 NOTE — Telephone Encounter (Signed)
Pt would like to hold off on allergist referral.

## 2018-08-11 NOTE — Telephone Encounter (Signed)
Pt has wrote the following My Chart message: Hi. After I Talked with you yesterday I had a situation come up I want to ask you about. As you know, my cough has been going on for years now and I have recently changed to Sulcrafate (2 weeks ago). I know it can take a long time for it to work but yesterday at about 5:30 I had a bad coughing attack. It was nonstop for 20-30 minutes. When it settled down I took 100mg  of gabapentin because I feared what would happen when I ate. It seems to be consensus that the basic cause of my problem is a leaky LES. My question for you today is: do you think it would be beneficial to do food sensitivity testing ? If you do, could this be done when I do my lab work?  I'm grasping at straws but I'm getting desperate. I started with Dr. Helane Rima helping me with this. She prescribed the [facility] and said I should call her if it didn't work. Since my experience yesterday was the first time I've had a problem since I started with the sulcrafate I hesitate to call her back so soon. Thanks.  Please advise

## 2018-08-16 ENCOUNTER — Ambulatory Visit: Payer: Medicare Other

## 2018-08-16 ENCOUNTER — Other Ambulatory Visit: Payer: Medicare Other

## 2018-08-22 ENCOUNTER — Other Ambulatory Visit: Payer: Medicare Other

## 2018-08-22 ENCOUNTER — Ambulatory Visit (INDEPENDENT_AMBULATORY_CARE_PROVIDER_SITE_OTHER): Payer: Medicare Other

## 2018-08-22 ENCOUNTER — Other Ambulatory Visit: Payer: Self-pay

## 2018-08-22 DIAGNOSIS — E039 Hypothyroidism, unspecified: Secondary | ICD-10-CM | POA: Diagnosis not present

## 2018-08-22 DIAGNOSIS — E782 Mixed hyperlipidemia: Secondary | ICD-10-CM

## 2018-08-22 DIAGNOSIS — I10 Essential (primary) hypertension: Secondary | ICD-10-CM | POA: Diagnosis not present

## 2018-08-22 LAB — COMPREHENSIVE METABOLIC PANEL
ALT: 21 U/L (ref 0–35)
AST: 20 U/L (ref 0–37)
Albumin: 4.5 g/dL (ref 3.5–5.2)
Alkaline Phosphatase: 112 U/L (ref 39–117)
BUN: 12 mg/dL (ref 6–23)
CO2: 30 mEq/L (ref 19–32)
Calcium: 9.2 mg/dL (ref 8.4–10.5)
Chloride: 103 mEq/L (ref 96–112)
Creatinine, Ser: 0.83 mg/dL (ref 0.40–1.20)
GFR: 67.61 mL/min (ref >60.00)
Glucose, Bld: 76 mg/dL (ref 70–99)
Potassium: 3.8 mEq/L (ref 3.5–5.1)
Sodium: 142 mEq/L (ref 135–145)
Total Bilirubin: 0.7 mg/dL (ref 0.2–1.2)
Total Protein: 6.5 g/dL (ref 6.0–8.3)

## 2018-08-22 LAB — T4, FREE: Free T4: 1 ng/dL (ref 0.60–1.60)

## 2018-08-22 LAB — CBC
HCT: 42.3 % (ref 36.0–46.0)
Hemoglobin: 14.6 g/dL (ref 12.0–15.0)
MCHC: 34.5 g/dL (ref 30.0–36.0)
MCV: 85.4 fl (ref 78.0–100.0)
Platelets: 238 10*3/uL (ref 150.0–400.0)
RBC: 4.95 Mil/uL (ref 3.87–5.11)
RDW: 13.1 % (ref 11.5–15.5)
WBC: 6.6 10*3/uL (ref 4.0–10.5)

## 2018-08-22 LAB — LIPID PANEL
Cholesterol: 171 mg/dL (ref 0–200)
HDL: 71.5 mg/dL (ref >39.00)
LDL Cholesterol: 80 mg/dL (ref 0–99)
NonHDL: 99.21
Total CHOL/HDL Ratio: 2
Triglycerides: 97 mg/dL (ref 0.0–149.0)
VLDL: 19.4 mg/dL (ref 0.0–40.0)

## 2018-08-22 LAB — TSH: TSH: 3.44 u[IU]/mL (ref 0.35–4.50)

## 2018-08-22 NOTE — Progress Notes (Signed)
BP noted.  EXCELLENT.

## 2018-08-22 NOTE — Progress Notes (Addendum)
Yvette Short is a 73 y.o. female presents to the office today for Blood pressure recheck secondary to Virtual visit  Blood pressure medication: Norvas 10mg  daily, Losartan 25mg  daily  If on medication, Last dose was at least 1-2 hours prior to recheck: No, 0845, BP took at 0937. Blood pressure was taken in the Right arm after patient rested for 10 minutes.  113/75 BP R arm, 100%O2, 68P   Caroll Rancher LPN   Dr Anitra Lauth Please review in Dr Lucita Lora absence  Thanks

## 2018-08-23 NOTE — Progress Notes (Signed)
Please inform patient the following information: Her BP is great. Her labs are all normal and excellent. I did send her results to Dr. Helane Rima as well for her- per her request.  F/u 6 mos.

## 2018-08-23 NOTE — Progress Notes (Signed)
Pt was called and given results. She verbalized understanding  

## 2018-11-17 ENCOUNTER — Other Ambulatory Visit: Payer: Self-pay

## 2018-11-17 ENCOUNTER — Ambulatory Visit: Payer: Medicare Other | Admitting: Family

## 2018-11-17 ENCOUNTER — Encounter: Payer: Self-pay | Admitting: Family

## 2018-11-17 VITALS — BP 144/82 | HR 66 | Temp 97.3°F | Resp 16 | Ht 64.5 in | Wt 121.0 lb

## 2018-11-17 DIAGNOSIS — R319 Hematuria, unspecified: Secondary | ICD-10-CM

## 2018-11-17 LAB — POC URINALSYSI DIPSTICK (AUTOMATED)
Bilirubin, UA: NEGATIVE
Glucose, UA: NEGATIVE
Ketones, UA: NEGATIVE
Leukocytes, UA: NEGATIVE
Nitrite, UA: NEGATIVE
Protein, UA: POSITIVE — AB
Spec Grav, UA: 1.015 (ref 1.010–1.025)
Urobilinogen, UA: NEGATIVE E.U./dL — AB
pH, UA: 6 (ref 5.0–8.0)

## 2018-11-17 MED ORDER — CEPHALEXIN 500 MG PO CAPS: 500.0000 mg | ORAL_CAPSULE | Freq: Three times a day (TID) | ORAL | 0 refills | Status: DC

## 2018-11-17 NOTE — Addendum Note (Signed)
Addended by: Jiles Prows on: 11/17/2018 04:10 PM   Modules accepted: Orders

## 2018-11-17 NOTE — Progress Notes (Signed)
Subjective:    Patient ID: Yvette Short, female    DOB: Jan 26, 1946, 73 y.o.   MRN: [id number]  HPI  Patient is a 73 yr old female who presents today with chief complaint of gross hematuria. She is a former smoker with a 32.5 year pack history. Reports that she has had small amounts of blood in her urine over the last few weeks but became more noticeable to day. Denies associated fever, frequency or dysuria. Does not some bilateral low back pain and suprapubic discomfort.       Review of Systems See HPI  Past Medical History:  Diagnosis Date  . Abnormal colonoscopy 03/2017   decreased rectal tone and polyp; 5 year recall  . Abnormal findings on esophagogastroduodenoscopy (EGD) 02/25/2016   Z-line irrg. at 40 cm. No abnomrality of the esophagus to explain dysphagia. Esophagus dilated. H/o + H. Pylori  . Allergy   . ANXIETY 07/06/2007  . Biceps tendonitis on right   . Chicken pox   . Colon polyp 03/31/2017   hyperplastic  . Diverticulosis 03/2017   sigmoid  . Family history of breast cancer   . Family history of colon cancer   . Family history of ovarian cancer   . Frequent UTI   . GERD 11/09/2006  . H. pylori infection    h/o treated wiht prevpac  . HYPERLIPIDEMIA 11/09/2006  . Hypertension   . Migraine   . Piriformis syndrome of right side   . Stomach ulcer 2008     Social History   Socioeconomic History  . Marital status: Married    Spouse name: Not on file  . Number of children: Not on file  . Years of education: Not on file  . Highest education level: Not on file  Occupational History  . Not on file  Social Needs  . Financial resource strain: Not on file  . Food insecurity    Worry: Not on file    Inability: Not on file  . Transportation needs    Medical: Not on file    Non-medical: Not on file  Tobacco Use  . Smoking status: Former Smoker    Packs/day: 2.50    Years: 13.00    Pack years: 32.50    Types: Cigarettes    Quit date: 07/28/1974   Years since quitting: 44.3  . Smokeless tobacco: Never Used  Substance and Sexual Activity  . Alcohol use: Yes    Alcohol/week: 2.0 standard drinks    Types: 2 Cans of beer per week  . Drug use: No  . Sexual activity: Yes    Partners: Male  Lifestyle  . Physical activity    Days per week: Not on file    Minutes per session: Not on file  . Stress: Not on file  Relationships  . Social Herbalist on phone: Not on file    Gets together: Not on file    Attends religious service: Not on file    Active member of club or organization: Not on file    Attends meetings of clubs or organizations: Not on file    Relationship status: Not on file  . Intimate partner violence    Fear of current or ex partner: Not on file    Emotionally abused: Not on file    Physically abused: Not on file    Forced sexual activity: Not on file  Other Topics Concern  . Not on file  Social History Narrative  Married. Two children.    College grad.    Former smoker.    Exercises routinely.    Drink caffeine.    Wears a hearing aid.    Smoke alarm in the home.    Wears her seatbelt.    Feels safe in her relationships.     Past Surgical History:  Procedure Laterality Date  . LIPOMA EXCISION     of back  . TUBAL LIGATION      Family History  Problem Relation Age of Onset  . Dementia Mother   . Hyperlipidemia Mother   . Colon cancer Father 30  . Colon polyps Brother        gets colonoscopy every 2-3 years  . Hyperlipidemia Brother   . Hypertension Brother   . Uterine cancer Maternal Aunt        dx in her 20s  . Colon cancer Maternal Uncle   . Colon cancer Paternal Aunt   . Heart attack Maternal Grandfather   . Cancer Other        MGMs sister - "abdominal cancer"  . Breast cancer Cousin     Allergies  Allergen Reactions  . Tetracycline Hcl Rash         Current Outpatient Medications on File Prior to Visit  Medication Sig Dispense Refill  . albuterol (PROVENTIL  HFA;VENTOLIN HFA) 108 (90 Base) MCG/ACT inhaler Inhale 2 puffs into the lungs every 4 (four) hours as needed for wheezing or shortness of breath. 1 each 6  . amLODipine (NORVASC) 10 MG tablet Take 1 tablet (10 mg total) by mouth daily. 90 tablet 1  . ascorbic acid (VITAMIN C) 250 MG CHEW Chew 250 mg by mouth daily.    Marland Kitchen atorvastatin (LIPITOR) 20 MG tablet Take 1 tablet (20 mg total) by mouth daily. 90 tablet 3  . Azelaic Acid 15 % cream APPLY TO AFFECTED AREA ON FACE UP TO TWO TIMES A DAY AS NEEDED    . chlorpheniramine (CHLOR-TRIMETON) 4 MG tablet Take 4 mg by mouth every 4 (four) hours as needed for allergies.    . Ergocalciferol (VITAMIN D2) 2000 units TABS Take 1 tablet by mouth daily.    Marland Kitchen estradiol (VIVELLE-DOT) 0.05 MG/24HR patch estradiol 0.05 mg/24 hr semiweekly transdermal patch  APPLY 1 PATCH TWICE A WEEK    . gabapentin (NEURONTIN) 100 MG capsule TAKE 2-3 CAPSULES (200-300 MG TOTAL) BY MOUTH AT BEDTIME. 270 capsule 1  . levothyroxine (SYNTHROID, LEVOTHROID) 88 MCG tablet Take 88 mcg by mouth daily.  3  . losartan (COZAAR) 25 MG tablet Take 1 tablet (25 mg total) by mouth daily. 90 tablet 1  . omeprazole (PRILOSEC) 40 MG capsule Take 1-2 capsules (40-80 mg total) by mouth daily. Take 30- 60 min before your first and last meals of the day 180 capsule 3  . progesterone (PROMETRIUM) 100 MG capsule TAKE 2 CAPSULE(S) EVERY DAY BY ORAL ROUTE FOR 12 DAYS.    Marland Kitchen sucralfate (CARAFATE) 1000 MG/10ML SUSP Take 10 mg/kg by mouth every 6 (six) hours.     No current facility-administered medications on file prior to visit.     BP (!) 144/82 (BP Location: Right Arm, Patient Position: Sitting, Cuff Size: Small)   Pulse 66   Temp (!) 97.3 F (36.3 C) (Temporal)   Resp 16   Ht 5' 4.5" (1.638 m)   Wt 121 lb (54.9 kg)   SpO2 100%   BMI 20.45 kg/m       Objective:  Physical Exam Constitutional:      Appearance: She is well-developed.  Neck:     Musculoskeletal: Neck supple.     Thyroid: No  thyromegaly.  Cardiovascular:     Rate and Rhythm: Normal rate and regular rhythm.     Heart sounds: Normal heart sounds. No murmur.  Pulmonary:     Effort: Pulmonary effort is normal. No respiratory distress.     Breath sounds: Normal breath sounds. No wheezing.  Abdominal:     General: Bowel sounds are normal. There is no distension.     Palpations: Abdomen is soft. There is no mass.     Tenderness: There is no right CVA tenderness or left CVA tenderness.     Comments: Mild suprapubic tenderness without guarding  Skin:    General: Skin is warm and dry.  Neurological:     Mental Status: She is alert and oriented to person, place, and time.  Psychiatric:        Behavior: Behavior normal.        Thought Content: Thought content normal.        Judgment: Judgment normal.           Assessment & Plan:  Hematuria- per CMA urine specimen provided today did not have visible blood. 3+ blood noted on dip + protein as well but this is not new.  Will send urine for culture.  Check cbc, bmet. Begin empiric rx for UTI with keflex. She is a former smoker so will need further evaluation/urology consult if microscopic hematuria persists.  I have advised her as follows:   Please complete lab work prior to leaving. Go to the ER if you develop severe pain, increased blood in urine or high fever. Begin keflex to cover you for a urinary tract infection. Complete lab work prior to leaving.  Follow up with PCP in 2 weeks.

## 2018-11-17 NOTE — Patient Instructions (Signed)
Please complete lab work prior to leaving. Go to the ER if you develop severe pain, increased blood in urine or high fever. Begin keflex to cover you for a urinary tract infection. Complete lab work prior to leaving.

## 2018-11-18 ENCOUNTER — Other Ambulatory Visit: Payer: Self-pay | Admitting: Family Medicine

## 2018-11-18 LAB — BASIC METABOLIC PANEL
BUN: 16 mg/dL (ref 7–25)
CO2: 27 mmol/L (ref 20–32)
Calcium: 9.8 mg/dL (ref 8.6–10.4)
Chloride: 102 mmol/L (ref 98–110)
Creat: 0.84 mg/dL (ref 0.60–0.93)
Glucose, Bld: 109 mg/dL — ABNORMAL HIGH (ref 65–99)
Potassium: 3.9 mmol/L (ref 3.5–5.3)
Sodium: 140 mmol/L (ref 135–146)

## 2018-11-18 LAB — CBC WITH DIFFERENTIAL/PLATELET
Absolute Monocytes: 529 cells/uL (ref 200–950)
Basophils Absolute: 63 cells/uL (ref 0–200)
Basophils Relative: 0.8 %
Eosinophils Absolute: 103 cells/uL (ref 15–500)
Eosinophils Relative: 1.3 %
HCT: 42.8 % (ref 35.0–45.0)
Hemoglobin: 14.4 g/dL (ref 11.7–15.5)
Lymphs Abs: 2315 cells/uL (ref 850–3900)
MCH: 28.9 pg (ref 27.0–33.0)
MCHC: 33.6 g/dL (ref 32.0–36.0)
MCV: 85.9 fL (ref 80.0–100.0)
MPV: 11.5 fL (ref 7.5–12.5)
Monocytes Relative: 6.7 %
Neutro Abs: 4890 cells/uL (ref 1500–7800)
Neutrophils Relative %: 61.9 %
Platelets: 260 10*3/uL (ref 140–400)
RBC: 4.98 10*6/uL (ref 3.80–5.10)
RDW: 13.1 % (ref 11.0–15.0)
Total Lymphocyte: 29.3 %
WBC: 7.9 10*3/uL (ref 3.8–10.8)

## 2018-11-20 NOTE — Telephone Encounter (Signed)
Left message for patient to call back to schedule virtual visit for medication refill.

## 2018-11-22 ENCOUNTER — Encounter: Payer: Self-pay | Admitting: Family

## 2018-11-22 NOTE — Telephone Encounter (Signed)
I called pt. Advised her to complete abx and keep upcoming appointment with PCP. Discussed if persistent microscopic hematuria at follow up then she will need referral to urology. Unfortunately, the lab did not run urine culture that was ordered. I advised pt of this and advised her to complete empiric abx.  She verbalizes understanding.

## 2018-11-23 ENCOUNTER — Encounter: Payer: Self-pay | Admitting: Family Medicine

## 2018-11-23 ENCOUNTER — Ambulatory Visit (INDEPENDENT_AMBULATORY_CARE_PROVIDER_SITE_OTHER): Payer: Medicare Other | Admitting: Family Medicine

## 2018-11-23 DIAGNOSIS — M48062 Spinal stenosis, lumbar region with neurogenic claudication: Secondary | ICD-10-CM

## 2018-11-23 MED ORDER — GABAPENTIN 100 MG PO CAPS: 200.0000 mg | ORAL_CAPSULE | Freq: Every day | ORAL | 1 refills | Status: DC

## 2018-11-23 NOTE — Progress Notes (Signed)
[facility] [place] Yvette Short, [name] Subjective:     CC: Back pain follow-up  [id number]  Yvette Short is a 73 y.o. female coming in with complaint of patient did have back pain and has known degenerative disc disease radiculopathy.  Patient has had some difficulty recently and was having worsening pain.  Patient states that it seems to have distorted getting better and is only doing.  Patient states that she has been able to start increasing activity again. Patient was going up on her gabapentin to 300 mg and that seemed a little bit better.  Started doing the exercises on a regular basis and also to     Past Medical History:  Diagnosis Date  . Abnormal colonoscopy 03/2017   decreased rectal tone and polyp; 5 year recall  . Abnormal findings on esophagogastroduodenoscopy (EGD) 02/25/2016   Z-line irrg. at 40 cm. No abnomrality of the esophagus to explain dysphagia. Esophagus dilated. H/o + H. Pylori  . Allergy   . ANXIETY 07/06/2007  . Biceps tendonitis on right   . Chicken pox   . Colon polyp 03/31/2017   hyperplastic  . Diverticulosis 03/2017   sigmoid  . Family history of breast cancer   . Family history of colon cancer   . Family history of ovarian cancer   . Frequent UTI   . GERD 11/09/2006  . H. pylori infection    h/o treated wiht prevpac  . HYPERLIPIDEMIA 11/09/2006  . Hypertension   . Migraine   . Piriformis syndrome of right side   . Stomach ulcer 2008   Past Surgical History:  Procedure Laterality Date  . LIPOMA EXCISION     of back  . TUBAL LIGATION     Social History   Socioeconomic History  . Marital status: Married    Spouse name: Not on file  . Number of children: Not on file  . Years of education: Not on file  . Highest education level: Not on file  Occupational History  . Not on file  Social Needs  . Financial resource strain: Not on file  . Food insecurity    Worry:  Not on file    Inability: Not on file  . Transportation needs    Medical: Not on file    Non-medical: Not on file  Tobacco Use  . Smoking status: Former Smoker    Packs/day: 2.50    Years: 13.00    Pack years: 32.50    Types: Cigarettes    Quit date: 07/28/1974    Years since quitting: 44.3  . Smokeless tobacco: Never Used  Substance and Sexual Activity  . Alcohol use: Yes    Alcohol/week: 2.0 standard drinks    Types: 2 Cans of beer per week  . Drug use: No  . Sexual activity: Yes    Partners: Male  Lifestyle  . Physical activity    Days per week: Not on file    Minutes per session: Not on file  . Stress: Not on file  Relationships  . Social Herbalist on phone: Not on file    Gets together: Not on file    Attends religious service: Not on file    Active member of club or organization: Not on file    Attends meetings of clubs or organizations: Not on file    Relationship status: Not on file  Other Topics Concern  . Not on  file  Social History Narrative   Married. Two children.    College grad.    Former smoker.    Exercises routinely.    Drink caffeine.    Wears a hearing aid.    Smoke alarm in the home.    Wears her seatbelt.    Feels safe in her relationships.    Allergies  Allergen Reactions  . Tetracycline Hcl Rash        Family History  Problem Relation Age of Onset  . Dementia Mother   . Hyperlipidemia Mother   . Colon cancer Father 54  . Colon polyps Brother        gets colonoscopy every 2-3 years  . Hyperlipidemia Brother   . Hypertension Brother   . Uterine cancer Maternal Aunt        dx in her 32s  . Colon cancer Maternal Uncle   . Colon cancer Paternal Aunt   . Heart attack Maternal Grandfather   . Cancer Other        MGMs sister - "abdominal cancer"  . Breast cancer Cousin     Current Outpatient Medications (Endocrine & Metabolic):  .  estradiol (VIVELLE-DOT) 0.05 MG/24HR patch, estradiol 0.05 mg/24 hr semiweekly transdermal  patch  APPLY 1 PATCH TWICE A WEEK .  levothyroxine (SYNTHROID, LEVOTHROID) 88 MCG tablet, Take 88 mcg by mouth daily. .  progesterone (PROMETRIUM) 100 MG capsule, TAKE 2 CAPSULE(S) EVERY DAY BY ORAL ROUTE FOR 12 DAYS.  Current Outpatient Medications (Cardiovascular):  .  amLODipine (NORVASC) 10 MG tablet, Take 1 tablet (10 mg total) by mouth daily. Marland Kitchen  atorvastatin (LIPITOR) 20 MG tablet, Take 1 tablet (20 mg total) by mouth daily. Marland Kitchen  losartan (COZAAR) 25 MG tablet, Take 1 tablet (25 mg total) by mouth daily.  Current Outpatient Medications (Respiratory):  .  albuterol (PROVENTIL HFA;VENTOLIN HFA) 108 (90 Base) MCG/ACT inhaler, Inhale 2 puffs into the lungs every 4 (four) hours as needed for wheezing or shortness of breath. .  chlorpheniramine (CHLOR-TRIMETON) 4 MG tablet, Take 4 mg by mouth every 4 (four) hours as needed for allergies.    Current Outpatient Medications (Other):  .  ascorbic acid (VITAMIN C) 250 MG CHEW, Chew 250 mg by mouth daily. .  Azelaic Acid 15 % cream, APPLY TO AFFECTED AREA ON FACE UP TO TWO TIMES A DAY AS NEEDED .  cephALEXin (KEFLEX) 500 MG capsule, Take 1 capsule (500 mg total) by mouth 3 (three) times daily. .  Ergocalciferol (VITAMIN D2) 2000 units TABS, Take 1 tablet by mouth daily. Marland Kitchen  gabapentin (NEURONTIN) 100 MG capsule, TAKE 2-3 CAPSULES (200-300 MG TOTAL) BY MOUTH AT BEDTIME. Marland Kitchen  omeprazole (PRILOSEC) 40 MG capsule, Take 1-2 capsules (40-80 mg total) by mouth daily. Take 30- 60 min before your first and last meals of the day .  sucralfate (CARAFATE) 1000 MG/10ML SUSP, Take 10 mg/kg by mouth every 6 (six) hours.    Past medical history, social, surgical and family history all reviewed in electronic medical record.  No pertanent information unless stated regarding to the chief complaint.   Review of Systems:  No headache, visual changes, nausea, vomiting, diarrhea, constipation, dizziness, abdominal pain, skin rash, fevers, chills, night sweats, weight  loss, swollen lymph nodes, body aches, joint swelling, chest pain, shortness of breath, mood changes.  Positive muscle aches  Objective  Blood pressure 118/82, height 5' 4.5" (1.638 m).   General: No apparent distress alert and oriented x3 mood and affect normal, dressed appropriately.  HEENT: Pupils equal, extraocular movements intact  Respiratory: Patient's speak in full sentences and does not appear short of breath  Cardiovascular: No lower extremity edema, non tender, no erythema  Skin: Warm dry intact with no signs of infection or rash on extremities or on axial skeleton.  Abdomen: Soft nontender  Neuro: Cranial nerves II through XII are intact, neurovascularly intact in all extremities with 2+ DTRs and 2+ pulses.  Lymph: No lymphadenopathy of posterior or anterior cervical chain or axillae bilaterally.  Gait normal with good balance and coordination.  MSK:  Non tender with full range of motion and good stability and symmetric strength and tone of shoulders, elbows, wrist, hip, knee and ankles bilaterally.  Arthritic changes of multiple joints Back exam does have significant degenerative scoliosis noted mild tender to palpation the paraspinal musculature lumbar spine right greater than left.  Patient does have mild positive Corky Sox.  Negative straight leg test. Impression and Recommendations:     The above documentation has been reviewed and is accurate and complete Lyndal Pulley, DO       Note: This dictation was prepared with Dragon dictation along with smaller phrase technology. Any transcriptional errors that result from this process are unintentional.

## 2018-11-23 NOTE — Patient Instructions (Signed)
Play with gabapentin at least 100mg  at night can go up to 300mg  Ice every night  Keep doing exercises See me again in 3 months

## 2018-11-23 NOTE — Assessment & Plan Note (Signed)
Stable at the moment.  Doing relatively well.  We discussed that we can always repeat the epidurals, unchanged different medications but patient feels like she is doing very well.  Patient will follow-up with me again in 4 to 8 weeks.

## 2018-12-06 ENCOUNTER — Ambulatory Visit (INDEPENDENT_AMBULATORY_CARE_PROVIDER_SITE_OTHER): Payer: Medicare Other | Admitting: Family Medicine

## 2018-12-06 ENCOUNTER — Other Ambulatory Visit: Payer: Self-pay

## 2018-12-06 ENCOUNTER — Encounter: Payer: Self-pay | Admitting: Family Medicine

## 2018-12-06 VITALS — BP 126/77 | HR 61 | Temp 97.6°F | Resp 16 | Ht 65.0 in | Wt 123.4 lb

## 2018-12-06 DIAGNOSIS — R319 Hematuria, unspecified: Secondary | ICD-10-CM | POA: Diagnosis not present

## 2018-12-06 LAB — POCT URINALYSIS DIPSTICK
Blood, UA: NEGATIVE
Glucose, UA: NEGATIVE
Leukocytes, UA: NEGATIVE
Nitrite, UA: NEGATIVE
Protein, UA: POSITIVE — AB
Spec Grav, UA: 1.02 (ref 1.010–1.025)
Urobilinogen, UA: 1 E.U./dL
pH, UA: 6 (ref 5.0–8.0)

## 2018-12-06 NOTE — Progress Notes (Signed)
Yvette Short , February 28, 1946, 73 y.o., female MRN: [id number] Patient Care Team    Relationship Specialty Notifications Start End  Ma Hillock, DO PCP - General Family Medicine  08/03/17   Dian Queen, MD Consulting Physician Obstetrics and Gynecology  08/03/17   Tanda Rockers, MD Consulting Physician Pulmonary Disease  08/03/17   Lyndal Pulley, DO Consulting Physician Sports Medicine  08/03/17   Celedonio Miyamoto, MD Referring Physician Gastroenterology  08/03/17   Marica Otter, [place]  Optometry  08/03/17     Chief Complaint  Patient presents with  . Follow-up    UTI symptoms are gone and not causing any problems      Subjective: Pt presents for an OV follow up due to gross hematuria experienced beginning of August. She was evaluated by a NP which treated prophylactic for a UTI and scheduled pt for followup. Pt reports she was a smoker many years ago. She did take the abx to completion and she reports no additional gross hematuria witnessed since treatment. She endorses very lower abdominal discomfort intermittently. Overall she feels her symptoms resolved with abx. Urine culture was not completed during that visit. BMP- normal, CBC- normal, urine RBC 3+, positive protein.   Depression screen [facility] 2/9 08/03/2017 05/07/2013  Decreased Interest 0 0  Down, Depressed, Hopeless 0 0  PHQ - 2 Score 0 0    Allergies  Allergen Reactions  . Tetracycline Hcl Rash        Social History   Social History Narrative   Married. Two children.    College grad.    Former smoker.    Exercises routinely.    Drink caffeine.    Wears a hearing aid.    Smoke alarm in the home.    Wears her seatbelt.    Feels safe in her relationships.    Past Medical History:  Diagnosis Date  . Abnormal colonoscopy 03/2017   decreased rectal tone and polyp; 5 year recall  . Abnormal findings on esophagogastroduodenoscopy (EGD) 02/25/2016   Z-line irrg. at 40 cm. No abnomrality of the esophagus to explain  dysphagia. Esophagus dilated. H/o + H. Pylori  . Allergy   . ANXIETY 07/06/2007  . Biceps tendonitis on right   . Chicken pox   . Colon polyp 03/31/2017   hyperplastic  . Diverticulosis 03/2017   sigmoid  . Family history of breast cancer   . Family history of colon cancer   . Family history of ovarian cancer   . Frequent UTI   . GERD 11/09/2006  . H. pylori infection    h/o treated wiht prevpac  . HYPERLIPIDEMIA 11/09/2006  . Hypertension   . Migraine   . Piriformis syndrome of right side   . Stomach ulcer 2008   Past Surgical History:  Procedure Laterality Date  . LIPOMA EXCISION     of back  . TUBAL LIGATION     Family History  Problem Relation Age of Onset  . Dementia Mother   . Hyperlipidemia Mother   . Colon cancer Father 89  . Colon polyps Brother        gets colonoscopy every 2-3 years  . Hyperlipidemia Brother   . Hypertension Brother   . Uterine cancer Maternal Aunt        dx in her 71s  . Colon cancer Maternal Uncle   . Colon cancer Paternal Aunt   . Heart attack Maternal Grandfather   . Cancer Other  MGMs sister - "abdominal cancer"  . Breast cancer Cousin    Allergies as of 12/06/2018      Reactions   Tetracycline Hcl Rash          Medication List       Accurate as of December 06, 2018  1:49 PM. If you have any questions, ask your nurse or doctor.        albuterol 108 (90 Base) MCG/ACT inhaler Commonly known as: VENTOLIN HFA Inhale 2 puffs into the lungs every 4 (four) hours as needed for wheezing or shortness of breath.   amLODipine 10 MG tablet Commonly known as: NORVASC Take 1 tablet (10 mg total) by mouth daily.   ascorbic acid 250 MG Chew Commonly known as: VITAMIN C Chew 250 mg by mouth daily.   atorvastatin 20 MG tablet Commonly known as: LIPITOR Take 1 tablet (20 mg total) by mouth daily.   Azelaic Acid 15 % cream APPLY TO AFFECTED AREA ON FACE UP TO TWO TIMES A DAY AS NEEDED   cephALEXin 500 MG capsule Commonly known  as: KEFLEX Take 1 capsule (500 mg total) by mouth 3 (three) times daily.   chlorpheniramine 4 MG tablet Commonly known as: CHLOR-TRIMETON Take 4 mg by mouth every 4 (four) hours as needed for allergies.   estradiol 0.05 MG/24HR patch Commonly known as: VIVELLE-DOT estradiol 0.05 mg/24 hr semiweekly transdermal patch  APPLY 1 PATCH TWICE A WEEK   gabapentin 100 MG capsule Commonly known as: NEURONTIN Take 2-3 capsules (200-300 mg total) by mouth at bedtime.   levothyroxine 88 MCG tablet Commonly known as: SYNTHROID Take 88 mcg by mouth daily.   losartan 25 MG tablet Commonly known as: COZAAR Take 1 tablet (25 mg total) by mouth daily.   omeprazole 40 MG capsule Commonly known as: PRILOSEC Take 1-2 capsules (40-80 mg total) by mouth daily. Take 30- 60 min before your first and last meals of the day   progesterone 100 MG capsule Commonly known as: PROMETRIUM TAKE 2 CAPSULE(S) EVERY DAY BY ORAL ROUTE FOR 12 DAYS.   sucralfate 1000 MG/10ML Susp Commonly known as: CARAFATE Take 10 mg/kg by mouth every 6 (six) hours.   Vitamin D2 50 MCG (2000 UT) Tabs Take 1 tablet by mouth daily.       All past medical history, surgical history, allergies, family history, immunizations andmedications were updated in the EMR today and reviewed under the history and medication portions of their EMR.     ROS: Negative, with the exception of above mentioned in HPI   Objective:  BP 126/77 (BP Location: Right Arm, Patient Position: Sitting, Cuff Size: Normal)   Pulse 61   Temp 97.6 F (36.4 C) (Temporal)   Resp 16   Ht 5\' 5"  (1.651 m)   Wt 123 lb 6 oz (56 kg)   SpO2 99%   BMI 20.53 kg/m  Body mass index is 20.53 kg/m. Gen: Afebrile. No acute distress. Nontoxic in appearance, well developed, well nourished.  HENT: AT. [place]. Eyes:Pupils Equal Round Reactive to light, Extraocular movements intact,  Conjunctiva without redness, discharge or icterus. CV: RRR no murmur, no edema Abd: Soft.  NTND. BS present. no Masses palpated. No rebound or guarding.  MSK: No CVA tenderness Skin: no rashes, purpura or petechiae.  Neuro:  Normal gait. PERLA. EOMi. Alert. Oriented x3 . Psych: Normal affect, dress and demeanor. Normal speech. Normal thought content and judgment.  No exam data present No results found. Results for orders placed or  performed in visit on 12/06/18 (from the past 24 hour(s))  POCT urinalysis dipstick     Status: Abnormal   Collection Time: 12/06/18  1:48 PM  Result Value Ref Range   Color, UA yellow    Clarity, UA cloudy    Glucose, UA Negative Negative   Bilirubin, UA 1+    Ketones, UA 5mg     Spec Grav, UA 1.020 1.010 - 1.025   Blood, UA negative    pH, UA 6.0 5.0 - 8.0   Protein, UA Positive (A) Negative   Urobilinogen, UA 1.0 0.2 or 1.0 E.U./dL   Nitrite, UA negative    Leukocytes, UA Negative Negative   Appearance     Odor      Assessment/Plan: Yvette Short is a 73 y.o. female present for OV for  Hematuria, unspecified type - discussed hematuria and causes today. Without prior urine culture- uncertain if she had a uti or possibly passed stone. She does feel sx improved with abx.  - discussed since she is a smoker- if blood present would need to follow her closely and if not resolved in 3 mos would refer to uro. Pt reports understanding.  - POCT did  Not show evidence of blood>> will send for micro to be complete. - POCT urinalysis dipstick> protein, no RBC identified  - Urinalysis w microscopic + reflex culture - f/u dependent on labs.    Reviewed expectations re: course of current medical issues.  Discussed self-management of symptoms.  Outlined signs and symptoms indicating need for more acute intervention.  Patient verbalized understanding and all questions were answered.  Patient received an After-Visit Summary.    Orders Placed This Encounter  Procedures  . POCT urinalysis dipstick   > 25 minutes spent with patient, >50%  of time spent face to face   Note is dictated utilizing voice recognition software. Although note has been proof read prior to signing, occasional typographical errors still can be missed. If any questions arise, please do not hesitate to call for verification.   electronically signed by:  Howard Pouch, DO  [place]

## 2018-12-06 NOTE — Patient Instructions (Signed)
If your urine is negative- no need to continue follow up.  If it still has blood we will monitor for 3 months total>> if it does not clear>> then we will need to refer you for further studies.    Hematuria, Adult Hematuria is blood in the urine. Blood may be visible in the urine, or it may be identified with a test. This condition can be caused by infections of the bladder, urethra, kidney, or prostate. Other possible causes include:  Kidney stones.  Cancer of the urinary tract.  Too much calcium in the urine.  Conditions that are passed from parent to child (inherited conditions).  Exercise that requires a lot of energy. Infections can usually be treated with medicine, and a kidney stone usually will pass through your urine. If neither of these is the cause of your hematuria, more tests may be needed to identify the cause of your symptoms. It is very important to tell your health care provider about any blood in your urine, even if it is painless or the blood stops without treatment. Blood in the urine, when it happens and then stops and then happens again, can be a symptom of a very serious condition, including cancer. There is no pain in the initial stages of many urinary cancers. Follow these instructions at home: Medicines  Take over-the-counter and prescription medicines only as told by your health care provider.  If you were prescribed an antibiotic medicine, take it as told by your health care provider. Do not stop taking the antibiotic even if you start to feel better. Eating and drinking  Drink enough fluid to keep your urine clear or pale yellow. It is recommended that you drink 3-4 quarts (2.8-3.8 L) a day. If you have been diagnosed with an infection, it is recommended that you drink cranberry juice in addition to large amounts of water.  Avoid caffeine, tea, and carbonated beverages. These tend to irritate the bladder.  Avoid alcohol because it may irritate the prostate  (men). General instructions  If you have been diagnosed with a kidney stone, follow your health care provider's instructions about straining your urine to catch the stone.  Empty your bladder often. Avoid holding urine for long periods of time.  If you are female: ? After a bowel movement, wipe from front to back and use each piece of toilet paper only once. ? Empty your bladder before and after sex.  Pay attention to any changes in your symptoms. Tell your health care provider about any changes or any new symptoms.  It is your responsibility to get your test results. Ask your health care provider, or the department performing the test, when your results will be ready.  Keep all follow-up visits as told by your health care provider. This is important. Contact a health care provider if:  You develop back pain.  You have a fever.  You have nausea or vomiting.  Your symptoms do not improve after 3 days.  Your symptoms get worse. Get help right away if:  You develop severe vomiting and are unable take medicine without vomiting.  You develop severe pain in your back or abdomen even though you are taking medicine.  You pass a large amount of blood in your urine.  You pass blood clots in your urine.  You feel very weak or like you might faint.  You faint. Summary  Hematuria is blood in the urine. It has many possible causes.  It is very important that you  tell your health care provider about any blood in your urine, even if it is painless or the blood stops without treatment.  Take over-the-counter and prescription medicines only as told by your health care provider.  Drink enough fluid to keep your urine clear or pale yellow. This information is not intended to replace advice given to you by your health care provider. Make sure you discuss any questions you have with your health care provider. Document Released: 03/15/2005 Document Revised: 02/25/2017 Document Reviewed:  04/17/2016 Elsevier Patient Education  2020 [employer].

## 2018-12-07 ENCOUNTER — Encounter: Payer: Self-pay | Admitting: Family Medicine

## 2018-12-07 LAB — URINALYSIS W MICROSCOPIC + REFLEX CULTURE
Bacteria, UA: NONE SEEN /HPF
Bilirubin Urine: NEGATIVE
Glucose, UA: NEGATIVE
Hgb urine dipstick: NEGATIVE
Hyaline Cast: NONE SEEN /LPF
Leukocyte Esterase: NEGATIVE
Nitrites, Initial: NEGATIVE
Protein, ur: NEGATIVE
Specific Gravity, Urine: 1.024 (ref 1.001–1.03)
pH: 6 (ref 5.0–8.0)

## 2018-12-07 LAB — NO CULTURE INDICATED

## 2019-01-08 DIAGNOSIS — Z79890 Hormone replacement therapy (postmenopausal): Secondary | ICD-10-CM | POA: Insufficient documentation

## 2019-01-08 HISTORY — DX: Hormone replacement therapy: Z79.890

## 2019-01-17 HISTORY — PX: CERVICAL POLYPECTOMY: SHX88

## 2019-02-07 ENCOUNTER — Ambulatory Visit: Payer: Medicare Other | Admitting: Family Medicine

## 2019-02-07 ENCOUNTER — Other Ambulatory Visit: Payer: Self-pay

## 2019-02-07 ENCOUNTER — Encounter: Payer: Self-pay | Admitting: Family Medicine

## 2019-02-07 VITALS — BP 134/72 | HR 68 | Temp 98.2°F | Resp 16 | Ht 65.0 in | Wt 122.4 lb

## 2019-02-07 DIAGNOSIS — R05 Cough: Secondary | ICD-10-CM

## 2019-02-07 DIAGNOSIS — I2510 Atherosclerotic heart disease of native coronary artery without angina pectoris: Secondary | ICD-10-CM | POA: Diagnosis not present

## 2019-02-07 DIAGNOSIS — I700 Atherosclerosis of aorta: Secondary | ICD-10-CM | POA: Diagnosis not present

## 2019-02-07 DIAGNOSIS — I10 Essential (primary) hypertension: Secondary | ICD-10-CM | POA: Diagnosis not present

## 2019-02-07 DIAGNOSIS — R319 Hematuria, unspecified: Secondary | ICD-10-CM

## 2019-02-07 DIAGNOSIS — J479 Bronchiectasis, uncomplicated: Secondary | ICD-10-CM

## 2019-02-07 DIAGNOSIS — E782 Mixed hyperlipidemia: Secondary | ICD-10-CM | POA: Diagnosis not present

## 2019-02-07 DIAGNOSIS — K219 Gastro-esophageal reflux disease without esophagitis: Secondary | ICD-10-CM

## 2019-02-07 DIAGNOSIS — R053 Chronic cough: Secondary | ICD-10-CM

## 2019-02-07 MED ORDER — LOSARTAN POTASSIUM 25 MG PO TABS: 25.0000 mg | ORAL_TABLET | Freq: Every day | ORAL | 1 refills | Status: DC

## 2019-02-07 MED ORDER — OMEPRAZOLE 40 MG PO CPDR: 40.0000 mg | DELAYED_RELEASE_CAPSULE | Freq: Every day | ORAL | 1 refills | Status: DC

## 2019-02-07 MED ORDER — AMLODIPINE BESYLATE 10 MG PO TABS: 10.0000 mg | ORAL_TABLET | Freq: Every day | ORAL | 1 refills | Status: DC

## 2019-02-07 MED ORDER — SUCRALFATE 1 GM/10ML PO SUSP: 1.0000 g | Freq: Three times a day (TID) | ORAL | 5 refills | Status: DC

## 2019-02-07 NOTE — Patient Instructions (Addendum)
Nice to see you today.  We will call you with urine results. If blood still present we will refer you to urology.   Blood pressure looks good. I have refilled Carafate, omeprazole and blood pressure meds.   Use miralax 1/4-1 cap daily in water if needed to help keep stools soft - but formed.   Your kidney's by CT and labs are normal.

## 2019-02-07 NOTE — Progress Notes (Signed)
SUBJECTIVE Chief Complaint  Patient presents with  . Hypertension    takes bp medication at night. took bp medication last night  . Gastroesophageal Reflux    HPI:  Essential hypertension/hyperlipidemia/statin use Pt reports  compliance with Amlodipine 10 mg Qd and losartan 25 mg QD. She is tolerating medication well, it has not made her chronic cough any worse. Patient denies chest pain, shortness of breath, dizziness or lower extremity edema. .  She has a chronic palpitations.  BMP: 08/03/2017 WNL CBC: 05/18/2017 WNL Lipid: 02/08/2018 total cholesterol 238, HDL 74, LDL 132, triglycerides 160-started statin after this collection. TSH: 08/03/2017 WNL Diet: low sodium Exercise: routine exercise RF: htn, hld, former smoker, fhx HD EKG 2012: Multiple PAC appreciated.  08/10/2017 EKG: SR. HR 63. Frequent PAC, PR 136 QT 442. Unchanged from prior EKG.   Hematuria: Patient has had episodes of hematuria.  She has been seen by her gynecologist recently and on pelvic exam had a cervical polyp.  Cervical polyp has been removed.  She states she did have some bleeding after removal was completed 01/17/2019.  She is not had any dysuria or urinary frequency.  She has not noted any hematuria.  Chronic cough/Pharyngoesophageal dysphagia/Bronchiectasis without complication (HCC)/GERD Evaluated by Pulm (Dr. Melvyn Novas), ENT and had EGD 2017 by Dr. Kenton Kingfisher. Pt reports she has had a chronic cough since before 2016. She was originally started omeprazole after EGD. She was told she has a leaky stomach valve, but no barrett's esophagus. Her omeprazole was increased to 80 mg a day by Dr. Melvyn Novas. He also recommended she increase gabapentin to BID, but she was unable to tolerate increase dose. She has been prescribed tessalon perles.  She has had occassions  in which food becomes stuck or slow transit to stomach. Her colonoscopy is UTD completed 03/2017 with 1 polyp. 5 year recall.  She is a former smoker. She has frequent  attacks after exercise as well.  - She is now off PPI and prescribed Carafate (originally started by GYN))>> which she feels is working but still has cough.   Ct Chest High Resolution Result Date: 07/12/2017--> IMPRESSION: 1. Scattered minimal cylindrical and varicoid bronchiectasis with associated scattered mucoid impaction and minimal tree-in-bud opacity in both lungs, predominantly in the mid to lower lungs. Findings are stable to slightly worsened since 2015 chest CT. Findings may be due to chronic infectious bronchiolitis from atypical mycobacterial infection (MAI). 2. Two vessel coronary atherosclerosis. Aortic Atherosclerosis (ICD10-I70.0). Electronically Signed   By: Ilona Sorrel M.D.   On: 07/12/2017 12:56  ROS: See pertinent positives and negatives per HPI.  Patient Active Problem List   Diagnosis Date Noted  . Aortic atherosclerosis ([place]) 02/08/2018  . Atherosclerosis of native coronary artery of native heart without angina pectoris 02/08/2018  . Hypothyroidism 08/03/2017  . Pharyngoesophageal dysphagia 08/03/2017  . Chronic cough 05/18/2017  . Genetic testing 03/26/2016  . Spinal stenosis, lumbar region, with neurogenic claudication 10/16/2014  . Lumbar radiculopathy 07/24/2014  . Bronchiectasis without complication ([place]) [id number]  . Essential hypertension 12/21/2010  . Hyperlipidemia 11/09/2006  . GERD 11/09/2006    Social History   Tobacco Use  . Smoking status: Former Smoker    Packs/day: 2.50    Years: 13.00    Pack years: 32.50    Types: Cigarettes    Quit date: 07/28/1974    Years since quitting: 44.5  . Smokeless tobacco: Never Used  Substance Use Topics  . Alcohol use: Yes    Alcohol/week: 2.0 standard  drinks    Types: 2 Cans of beer per week    Current Outpatient Medications:  .  amLODipine (NORVASC) 10 MG tablet, Take 1 tablet (10 mg total) by mouth daily., Disp: 90 tablet, Rfl: 1 .  ascorbic acid (VITAMIN C) 250 MG CHEW, Chew 250 mg by mouth daily.,  Disp: , Rfl:  .  atorvastatin (LIPITOR) 20 MG tablet, Take 1 tablet (20 mg total) by mouth daily., Disp: 90 tablet, Rfl: 3 .  Azelaic Acid 15 % cream, APPLY TO AFFECTED AREA ON FACE UP TO TWO TIMES A DAY AS NEEDED, Disp: , Rfl:  .  chlorpheniramine (CHLOR-TRIMETON) 4 MG tablet, Take 4 mg by mouth every 4 (four) hours as needed for allergies., Disp: , Rfl:  .  Ergocalciferol (VITAMIN D2) 2000 units TABS, Take 1 tablet by mouth daily., Disp: , Rfl:  .  estradiol (VIVELLE-DOT) 0.05 MG/24HR patch, estradiol 0.05 mg/24 hr semiweekly transdermal patch  APPLY 1 PATCH TWICE A WEEK, Disp: , Rfl:  .  gabapentin (NEURONTIN) 100 MG capsule, Take 2-3 capsules (200-300 mg total) by mouth at bedtime., Disp: 270 capsule, Rfl: 1 .  levothyroxine (SYNTHROID, LEVOTHROID) 88 MCG tablet, Take 88 mcg by mouth daily., Disp: , Rfl: 3 .  losartan (COZAAR) 25 MG tablet, Take 1 tablet (25 mg total) by mouth daily., Disp: 90 tablet, Rfl: 1 .  progesterone (PROMETRIUM) 100 MG capsule, TAKE 2 CAPSULE(S) EVERY DAY BY ORAL ROUTE FOR 12 DAYS., Disp: , Rfl:  .  sucralfate (CARAFATE) 1000 MG/10ML SUSP, Take 10 mg/kg by mouth every 6 (six) hours., Disp: , Rfl:  .  albuterol (PROVENTIL HFA;VENTOLIN HFA) 108 (90 Base) MCG/ACT inhaler, Inhale 2 puffs into the lungs every 4 (four) hours as needed for wheezing or shortness of breath. (Patient not taking: Reported on 02/07/2019), Disp: 1 each, Rfl: 6 .  omeprazole (PRILOSEC) 40 MG capsule, Take 1-2 capsules (40-80 mg total) by mouth daily. Take 30- 60 min before your first and last meals of the day (Patient not taking: Reported on 02/07/2019), Disp: 180 capsule, Rfl: 3  Allergies  Allergen Reactions  . Tetracycline Hcl Rash         OBJECTIVE: BP 134/72 (BP Location: Right Arm, Patient Position: Sitting, Cuff Size: Normal)   Pulse 68   Temp 98.2 F (36.8 C) (Temporal)   Resp 16   Ht 5\' 5"  (1.651 m)   Wt 122 lb 6 oz (55.5 kg)   SpO2 100%   BMI 20.36 kg/m  Gen: Afebrile. No acute  distress. Nontoxic in appearance.Pleasant caucasian female.  HENT: AT. [place]. No cough or hoarseness on exam.  Eyes:Pupils Equal Round Reactive to light, Extraocular movements intact,  Conjunctiva without redness, discharge or icterus. Neck/lymp/endocrine: Supple,no lymphadenopathy, no thyromegaly CV: RRR no murmur, no edema, +2/4 P posterior tibialis pulses Chest: CTAB, no wheeze or crackles Abd: Soft. flat. NTND. BS present. no Masses palpated. .  Neuro:  Normal gait. PERLA. EOMi. Alert. Oriented x3 Psych: Normal affect, dress and demeanor. Normal speech. Normal thought content and judgment.  ASSESSMENT AND PLAN: Yvette Short is a 73 y.o. female present for  Essential hypertension/hyperlipidemia/palpitations/aortic ad coronary  atherosclerosis  - stable. VS check on lab appt this week.  - Refills provided on Losartan (cough present before losartan- and unchanged since) and amlodipine 10 mg QD.  - continue statin - CMP: 08/03/2017 WNL>>ordered - CBC: 05/18/2017 WNL>> ordered - Lipid: 01/2018 --> started statin>> repeat today - TSH: 08/03/2017 WNL>> ordered (med managed by gyn-  send results to [facility]) - Diet: low sodium - Exercise: routine exercise - F/U 6 months.   Chronic cough/Pharyngoesophageal dysphagia/Bronchiectasis without complication (HCC)/GERD - chronic cough started ~2016, on high dose omeprazole. Last EGD 2017, Dr. Kenton Kingfisher - pulmonology (Dr. Melvyn Novas) did not feel it was caused by lung condition. Ct abnormal w/ tree in bud.  - [facility] and Dr. Collene Mares office did not accept her bc they did not feel they could offer her more. -  Encouraged her to contiue to follow with Dr. Kenton Kingfisher and if needed could try to refer to [facility] for another opinion or [facility]>>> now on carafate and seems to be helping.  Encouraged her to restart the PPI as well as continue the Carafate.  Refilled these today. - tried albuterol before exercise>> patient reported it was not helpful. - F/U PRN   Hematuria, unspecified type/recent cervical polypectomy.  -Discussed today the hematuria we were following could likely be secondary to bleeding noted from the cervical polyp.  Will repeat urinalysis today and patient will be called with results. -If hematuria still present, referral to urology will be placed. - Urinalysis with Culture Reflex    > 25 minutes spent with patient, >50% of time spent face to face counseling     Howard Pouch, DO 02/07/2019

## 2019-02-10 LAB — URINALYSIS W MICROSCOPIC + REFLEX CULTURE
Bacteria, UA: NONE SEEN /HPF
Bilirubin Urine: NEGATIVE
Glucose, UA: NEGATIVE
Hgb urine dipstick: NEGATIVE
Ketones, ur: NEGATIVE
Nitrites, Initial: NEGATIVE
Protein, ur: NEGATIVE
Specific Gravity, Urine: 1.01 (ref 1.001–1.03)
pH: 6 (ref 5.0–8.0)

## 2019-02-10 LAB — URINE CULTURE
MICRO NUMBER:: 1092416
SPECIMEN QUALITY:: ADEQUATE

## 2019-02-10 LAB — CULTURE INDICATED

## 2019-02-12 ENCOUNTER — Telehealth: Payer: Self-pay | Admitting: Family Medicine

## 2019-02-12 MED ORDER — CEPHALEXIN 500 MG PO CAPS: 500.0000 mg | ORAL_CAPSULE | Freq: Four times a day (QID) | ORAL | 0 refills | Status: DC

## 2019-02-12 NOTE — Telephone Encounter (Signed)
Please inform patient the following information: Her urine did not show evidence of blood any longer.  However it did have some white blood cells, therefore we sent it for culture.  The culture did take quite a few days to return, however the culture is positive for an E. coli urinary tract infection.  I have called in an antibiotic for her to take as bottle directs.  It is called Keflex, cephalexin and it is a medication she has had in the past and tolerated

## 2019-02-12 NOTE — Telephone Encounter (Signed)
Pt was called and given information/results, she verbalized understanding  

## 2019-02-14 ENCOUNTER — Encounter: Payer: Self-pay | Admitting: Family Medicine

## 2019-02-14 ENCOUNTER — Other Ambulatory Visit: Payer: Self-pay

## 2019-02-14 ENCOUNTER — Ambulatory Visit: Payer: Medicare Other | Admitting: Family Medicine

## 2019-02-14 VITALS — BP 110/70 | Ht 65.0 in | Wt 123.0 lb

## 2019-02-14 DIAGNOSIS — M48062 Spinal stenosis, lumbar region with neurogenic claudication: Secondary | ICD-10-CM

## 2019-02-14 DIAGNOSIS — F329 Major depressive disorder, single episode, unspecified: Secondary | ICD-10-CM | POA: Diagnosis not present

## 2019-02-14 DIAGNOSIS — F32A Depression, unspecified: Secondary | ICD-10-CM

## 2019-02-14 DIAGNOSIS — F320 Major depressive disorder, single episode, mild: Secondary | ICD-10-CM

## 2019-02-14 HISTORY — DX: Depression, unspecified: F32.A

## 2019-02-14 HISTORY — DX: Major depressive disorder, single episode, mild: F32.0

## 2019-02-14 MED ORDER — VENLAFAXINE HCL ER 37.5 MG PO CP24: 37.5000 mg | ORAL_CAPSULE | Freq: Every day | ORAL | 0 refills | Status: DC

## 2019-02-14 NOTE — Assessment & Plan Note (Signed)
Mild depression.  Patient denies any suicidal or homicidal ideation.  Patient has had difficulty with this before and has responded well to Effexor.  Patient will have Effexor again.  Refilled.  Referred for counseling.  Patient is in agreement with the plan and will call if any worsening symptoms.

## 2019-02-14 NOTE — Assessment & Plan Note (Signed)
History of the neurogenic claudication.  Has responded fairly well to the epidurals.  Patient is coming up on 8 years since the previous surgery of the back and is concerned she might need another one at some point.  I do believe that some underlying depression is contributing.  Started on Effexor.  Has had good resolution of pain with this in the past.  Encouraged her to continue the gabapentin at this point.  Follow-up again in 5 weeks

## 2019-02-14 NOTE — Progress Notes (Signed)
[facility] [address] [place], [place] [address] Phone: [phone] Subjective:   Fontaine No, am serving as a scribe for Dr. Hulan Saas.  CC:   [id number]   11/23/2018 Stable at the moment.  Doing relatively well.  We discussed that we can always repeat the epidurals, unchanged different medications but patient feels like she is doing very well.  Patient will follow-up with me again in 4 to 8 weeks.  Update 02/14/2019 Yvette Short is a 73 y.o. female coming in with complaint of back pain. Is using 300mg  QHS. Continues to have radiculopathy in right lateral leg. Has more good days than bad days. Has had epidural injections. One helped her pain and one did not.  Patient has had some feelings of depression.  Has had a history of this previously.  Denies any suicidal or homicidal ideation but is still having difficulty.  Has done well with Effexor in the past.     Past Medical History:  Diagnosis Date  . Abnormal colonoscopy 03/2017   decreased rectal tone and polyp; 5 year recall  . Abnormal findings on esophagogastroduodenoscopy (EGD) 02/25/2016   Z-line irrg. at 40 cm. No abnomrality of the esophagus to explain dysphagia. Esophagus dilated. H/o + H. Pylori  . Allergy   . ANXIETY 07/06/2007  . Biceps tendonitis on right   . Chicken pox   . Colon polyp 03/31/2017   hyperplastic  . Diverticulosis 03/2017   sigmoid  . Family history of breast cancer   . Family history of colon cancer   . Family history of ovarian cancer   . Frequent UTI   . GERD 11/09/2006  . H. pylori infection    h/o treated wiht prevpac  . HYPERLIPIDEMIA 11/09/2006  . Hypertension   . Migraine   . Piriformis syndrome of right side   . Stomach ulcer 2008   Past Surgical History:  Procedure Laterality Date  . CERVICAL POLYPECTOMY  01/17/2019  . LIPOMA EXCISION     of back  . TUBAL LIGATION     Social History   Socioeconomic History  . Marital status:  Married    Spouse name: Not on file  . Number of children: Not on file  . Years of education: Not on file  . Highest education level: Not on file  Occupational History  . Not on file  Social Needs  . Financial resource strain: Not on file  . Food insecurity    Worry: Not on file    Inability: Not on file  . Transportation needs    Medical: Not on file    Non-medical: Not on file  Tobacco Use  . Smoking status: Former Smoker    Packs/day: 2.50    Years: 13.00    Pack years: 32.50    Types: Cigarettes    Quit date: 07/28/1974    Years since quitting: 44.5  . Smokeless tobacco: Never Used  Substance and Sexual Activity  . Alcohol use: Yes    Alcohol/week: 2.0 standard drinks    Types: 2 Cans of beer per week  . Drug use: No  . Sexual activity: Yes    Partners: Male  Lifestyle  . Physical activity    Days per week: Not on file    Minutes per session: Not on file  . Stress: Not on file  Relationships  . Social Herbalist on phone: Not on file    Gets together: Not  on file    Attends religious service: Not on file    Active member of club or organization: Not on file    Attends meetings of clubs or organizations: Not on file    Relationship status: Not on file  Other Topics Concern  . Not on file  Social History Narrative   Married. Two children.    College grad.    Former smoker.    Exercises routinely.    Drink caffeine.    Wears a hearing aid.    Smoke alarm in the home.    Wears her seatbelt.    Feels safe in her relationships.    Allergies  Allergen Reactions  . Tetracycline Hcl Rash        Family History  Problem Relation Age of Onset  . Dementia Mother   . Hyperlipidemia Mother   . Colon cancer Father 2  . Colon polyps Brother        gets colonoscopy every 2-3 years  . Hyperlipidemia Brother   . Hypertension Brother   . Uterine cancer Maternal Aunt        dx in her 67s  . Colon cancer Maternal Uncle   . Colon cancer Paternal Aunt    . Heart attack Maternal Grandfather   . Cancer Other        MGMs sister - "abdominal cancer"  . Breast cancer Cousin     Current Outpatient Medications (Endocrine & Metabolic):  .  estradiol (VIVELLE-DOT) 0.05 MG/24HR patch, estradiol 0.05 mg/24 hr semiweekly transdermal patch  APPLY 1 PATCH TWICE A WEEK .  levothyroxine (SYNTHROID, LEVOTHROID) 88 MCG tablet, Take 88 mcg by mouth daily. .  progesterone (PROMETRIUM) 100 MG capsule, TAKE 2 CAPSULE(S) EVERY DAY BY ORAL ROUTE FOR 12 DAYS.  Current Outpatient Medications (Cardiovascular):  .  amLODipine (NORVASC) 10 MG tablet, Take 1 tablet (10 mg total) by mouth daily. Marland Kitchen  atorvastatin (LIPITOR) 20 MG tablet, Take 1 tablet (20 mg total) by mouth daily. Marland Kitchen  losartan (COZAAR) 25 MG tablet, Take 1 tablet (25 mg total) by mouth daily.  Current Outpatient Medications (Respiratory):  .  albuterol (PROVENTIL HFA;VENTOLIN HFA) 108 (90 Base) MCG/ACT inhaler, Inhale 2 puffs into the lungs every 4 (four) hours as needed for wheezing or shortness of breath. .  chlorpheniramine (CHLOR-TRIMETON) 4 MG tablet, Take 4 mg by mouth every 4 (four) hours as needed for allergies.    Current Outpatient Medications (Other):  .  ascorbic acid (VITAMIN C) 250 MG CHEW, Chew 250 mg by mouth daily. .  Azelaic Acid 15 % cream, APPLY TO AFFECTED AREA ON FACE UP TO TWO TIMES A DAY AS NEEDED .  cephALEXin (KEFLEX) 500 MG capsule, Take 1 capsule (500 mg total) by mouth 4 (four) times daily. .  Ergocalciferol (VITAMIN D2) 2000 units TABS, Take 1 tablet by mouth daily. Marland Kitchen  gabapentin (NEURONTIN) 100 MG capsule, Take 2-3 capsules (200-300 mg total) by mouth at bedtime. Marland Kitchen  omeprazole (PRILOSEC) 40 MG capsule, Take 1 capsule (40 mg total) by mouth daily. Take 30- 60 min before your first meal .  sucralfate (CARAFATE) 1 GM/10ML suspension, Take 10 mLs (1 g total) by mouth 4 (four) times daily -  with meals and at bedtime. Marland Kitchen  venlafaxine XR (EFFEXOR XR) 37.5 MG 24 hr capsule, Take 1  capsule (37.5 mg total) by mouth daily with breakfast.    Past medical history, social, surgical and family history all reviewed in electronic medical record.  No pertanent  information unless stated regarding to the chief complaint.   Review of Systems:  No headache, visual changes, nausea, vomiting, diarrhea, constipation, dizziness, abdominal pain, skin rash, fevers, chills, night sweats, weight loss, swollen lymph nodes, body aches, joint swelling, chest pain, shortness of breath.  Positive mood changes, muscle aches  Objective  Blood pressure 110/70, height 5\' 5"  (1.651 m), weight 123 lb (55.8 kg).    General: No apparent distress alert and oriented x3 mood and affect normal, dressed appropriately.  HEENT: Pupils equal, extraocular movements intact  Respiratory: Patient's speak in full sentences and does not appear short of breath  Cardiovascular: No lower extremity edema, non tender, no erythema  Skin: Warm dry intact with no signs of infection or rash on extremities or on axial skeleton.  Abdomen: Soft nontender  Neuro: Cranial nerves II through XII are intact, neurovascularly intact in all extremities with 2+ DTRs and 2+ pulses.  Lymph: No lymphadenopathy of posterior or anterior cervical chain or axillae bilaterally.  Gait normal with good balance and coordination.  MSK:  Non tender with full range of motion and good stability and symmetric strength and tone of shoulders, elbows, wrist, hip, knee and ankles bilaterally.  Back exam does have some mild degenerative scoliosis.  Negative straight leg test but no significant tightness of the hamstring on the right.  Mild positive Corky Sox on the right.  Neurovascular intact distally, 4+ out of 5 strength in lower extremities but symmetric   Impression and Recommendations:     This case required medical decision making of moderate complexity. The above documentation has been reviewed and is accurate and complete Lyndal Pulley, DO        Note: This dictation was prepared with Dragon dictation along with smaller phrase technology. Any transcriptional errors that result from this process are unintentional.

## 2019-02-14 NOTE — Patient Instructions (Signed)
Effexor 37.5  Behavioral Health will call you Continue gabapentin Hold on injection for now See me in 5 weeks

## 2019-03-08 ENCOUNTER — Other Ambulatory Visit: Payer: Self-pay | Admitting: Family Medicine

## 2019-03-12 ENCOUNTER — Ambulatory Visit (INDEPENDENT_AMBULATORY_CARE_PROVIDER_SITE_OTHER): Payer: Medicare Other | Admitting: Psychology

## 2019-03-12 DIAGNOSIS — F331 Major depressive disorder, recurrent, moderate: Secondary | ICD-10-CM | POA: Diagnosis not present

## 2019-03-21 ENCOUNTER — Encounter: Payer: Self-pay | Admitting: Family Medicine

## 2019-03-21 ENCOUNTER — Ambulatory Visit: Payer: Medicare Other | Admitting: Family Medicine

## 2019-03-21 ENCOUNTER — Other Ambulatory Visit: Payer: Self-pay

## 2019-03-21 DIAGNOSIS — M48062 Spinal stenosis, lumbar region with neurogenic claudication: Secondary | ICD-10-CM

## 2019-03-21 DIAGNOSIS — F320 Major depressive disorder, single episode, mild: Secondary | ICD-10-CM | POA: Diagnosis not present

## 2019-03-21 DIAGNOSIS — F32A Depression, unspecified: Secondary | ICD-10-CM

## 2019-03-21 NOTE — Assessment & Plan Note (Signed)
Patient is doing remarkably better at this time.  Continue the Effexor and gabapentin at the current dose.  Declined any type of epidural at the moment.  Doing very well with the home exercises and does not feel that formal physical therapy is necessary at this time.  Follow-up with me again 2 to 3 months.

## 2019-03-21 NOTE — Progress Notes (Signed)
[facility] [place],  [address] Phone: [phone] Subjective:   Yvette Short, am serving as a scribe for Dr. Hulan Saas.   This visit occurred during the SARS-CoV-2 public health emergency.  Safety protocols were in place, including screening questions prior to the visit, additional usage of staff PPE, and extensive cleaning of exam room while observing appropriate contact time as indicated for disinfecting solutions.   I'm seeing this patient by the request  of:  Kuneff, Renee A, DO  CC: Low back pain follow-up  [id number]  Yvette Short is a 73 y.o. female coming in with complaint of back pain. States Pain is a little better than before.  Patient feels that the Effexor is making significant difference at this time.  Really no pain on a daily basis. If she does increase activity unfortunately some mild discomfort but nothing severe.  Minimal radiation down the legs anymore at this time.  Able to do daily activities and rest comfortably.  No side effects to the medications     Past Medical History:  Diagnosis Date  . Abnormal colonoscopy 03/2017   decreased rectal tone and polyp; 5 year recall  . Abnormal findings on esophagogastroduodenoscopy (EGD) 02/25/2016   Z-line irrg. at 40 cm. No abnomrality of the esophagus to explain dysphagia. Esophagus dilated. H/o + H. Pylori  . Allergy   . ANXIETY 07/06/2007  . Biceps tendonitis on right   . Chicken pox   . Colon polyp 03/31/2017   hyperplastic  . Diverticulosis 03/2017   sigmoid  . Family history of breast cancer   . Family history of colon cancer   . Family history of ovarian cancer   . Frequent UTI   . GERD 11/09/2006  . H. pylori infection    h/o treated wiht prevpac  . HYPERLIPIDEMIA 11/09/2006  . Hypertension   . Migraine   . Piriformis syndrome of right side   . Stomach ulcer 2008   Past Surgical History:  Procedure Laterality Date  . CERVICAL  POLYPECTOMY  01/17/2019  . LIPOMA EXCISION     of back  . TUBAL LIGATION     Social History   Socioeconomic History  . Marital status: Married    Spouse name: Not on file  . Number of children: Not on file  . Years of education: Not on file  . Highest education level: Not on file  Occupational History  . Not on file  Tobacco Use  . Smoking status: Former Smoker    Packs/day: 2.50    Years: 13.00    Pack years: 32.50    Types: Cigarettes    Quit date: 07/28/1974    Years since quitting: 44.6  . Smokeless tobacco: Never Used  Substance and Sexual Activity  . Alcohol use: Yes    Alcohol/week: 2.0 standard drinks    Types: 2 Cans of beer per week  . Drug use: No  . Sexual activity: Yes    Partners: Male  Other Topics Concern  . Not on file  Social History Narrative   Married. Two children.    College grad.    Former smoker.    Exercises routinely.    Drink caffeine.    Wears a hearing aid.    Smoke alarm in the home.    Wears her seatbelt.    Feels safe in her relationships.    Social Determinants of Health   Financial Resource Strain:   .  Difficulty of Paying Living Expenses: Not on file  Food Insecurity:   . Worried About Charity fundraiser in the Last Year: Not on file  . Ran Out of Food in the Last Year: Not on file  Transportation Needs:   . Lack of Transportation (Medical): Not on file  . Lack of Transportation (Non-Medical): Not on file  Physical Activity:   . Days of Exercise per Week: Not on file  . Minutes of Exercise per Session: Not on file  Stress:   . Feeling of Stress : Not on file  Social Connections:   . Frequency of Communication with Friends and Family: Not on file  . Frequency of Social Gatherings with Friends and Family: Not on file  . Attends Religious Services: Not on file  . Active Member of Clubs or Organizations: Not on file  . Attends Archivist Meetings: Not on file  . Marital Status: Not on file   Allergies    Allergen Reactions  . Tetracycline Hcl Rash        Family History  Problem Relation Age of Onset  . Dementia Mother   . Hyperlipidemia Mother   . Colon cancer Father 20  . Colon polyps Brother        gets colonoscopy every 2-3 years  . Hyperlipidemia Brother   . Hypertension Brother   . Uterine cancer Maternal Aunt        dx in her 74s  . Colon cancer Maternal Uncle   . Colon cancer Paternal Aunt   . Heart attack Maternal Grandfather   . Cancer Other        MGMs sister - "abdominal cancer"  . Breast cancer Cousin     Current Outpatient Medications (Endocrine & Metabolic):  .  estradiol (VIVELLE-DOT) 0.05 MG/24HR patch, estradiol 0.05 mg/24 hr semiweekly transdermal patch  APPLY 1 PATCH TWICE A WEEK .  levothyroxine (SYNTHROID, LEVOTHROID) 88 MCG tablet, Take 88 mcg by mouth daily. .  progesterone (PROMETRIUM) 100 MG capsule, TAKE 2 CAPSULE(S) EVERY DAY BY ORAL ROUTE FOR 12 DAYS.  Current Outpatient Medications (Cardiovascular):  .  amLODipine (NORVASC) 10 MG tablet, Take 1 tablet (10 mg total) by mouth daily. [facility]  atorvastatin (LIPITOR) 20 MG tablet, Take 1 tablet (20 mg total) by mouth daily. [facility]  losartan (COZAAR) 25 MG tablet, Take 1 tablet (25 mg total) by mouth daily.  Current Outpatient Medications (Respiratory):  .  albuterol (PROVENTIL HFA;VENTOLIN HFA) 108 (90 Base) MCG/ACT inhaler, Inhale 2 puffs into the lungs every 4 (four) hours as needed for wheezing or shortness of breath. .  chlorpheniramine (CHLOR-TRIMETON) 4 MG tablet, Take 4 mg by mouth every 4 (four) hours as needed for allergies.    Current Outpatient Medications (Other):  .  ascorbic acid (VITAMIN C) 250 MG CHEW, Chew 250 mg by mouth daily. .  Azelaic Acid 15 % cream, APPLY TO AFFECTED AREA ON FACE UP TO TWO TIMES A DAY AS NEEDED .  cephALEXin (KEFLEX) 500 MG capsule, Take 1 capsule (500 mg total) by mouth 4 (four) times daily. .  Ergocalciferol (VITAMIN D2) 2000 units TABS, Take 1 tablet by mouth  daily. [facility]  gabapentin (NEURONTIN) 100 MG capsule, Take 2-3 capsules (200-300 mg total) by mouth at bedtime. [facility]  omeprazole (PRILOSEC) 40 MG capsule, Take 1 capsule (40 mg total) by mouth daily. Take 30- 60 min before your first meal .  sucralfate (CARAFATE) 1 GM/10ML suspension, Take 10 mLs (1 g total) by mouth  4 (four) times daily -  with meals and at bedtime. [facility]  venlafaxine XR (EFFEXOR-XR) 37.5 MG 24 hr capsule, TAKE 1 CAPSULE BY MOUTH DAILY WITH BREAKFAST.    Past medical history, social, surgical and family history all reviewed in electronic medical record.  No pertanent information unless stated regarding to the chief complaint.   Review of Systems:  No headache, visual changes, nausea, vomiting, diarrhea, constipation, dizziness, abdominal pain, skin rash, fevers, chills, night sweats, weight loss, swollen lymph nodes, body aches, joint swelling, muscle aches, chest pain, shortness of breath, mood changes.   Objective  There were no vitals taken for this visit.    General: No apparent distress alert and oriented x3 mood and affect normal, dressed appropriately.  HEENT: Pupils equal, extraocular movements intact  Respiratory: Patient's speak in full sentences and does not appear short of breath  Cardiovascular: No lower extremity edema, non tender, no erythema  Skin: Warm dry intact with no signs of infection or rash on extremities or on axial skeleton.  Abdomen: Soft nontender  Neuro: Cranial nerves II through XII are intact, neurovascularly intact in all extremities with 2+ DTRs and 2+ pulses.  Lymph: No lymphadenopathy of posterior or anterior cervical chain or axillae bilaterally.  Gait normal with good balance and coordination.  MSK: Arthritic changes of multiple joints.  Back exam mild degenerative scoliosis noted lumbar spine with tightness of the straight leg test.  Mild tightness with Corky Sox test but no radicular symptoms on either.  4+ out of 5 strength bilaterally.   Neurovascularly intact distally.   Impression and Recommendations:     This case required medical decision making of moderate complexity. The above documentation has been reviewed and is accurate and complete Lyndal Pulley, DO       Note: This dictation was prepared with Dragon dictation along with smaller phrase technology. Any transcriptional errors that result from this process are unintentional.

## 2019-03-21 NOTE — Assessment & Plan Note (Signed)
Doing much better with the Effexor at this time 2.  No side effects.  Continue same medication

## 2019-03-21 NOTE — Patient Instructions (Addendum)
Good to see you.  You are amazing Can't wait to try the cookies Keep it up  Happy holidays!  See me again in 2-3 months

## 2019-04-03 ENCOUNTER — Ambulatory Visit (INDEPENDENT_AMBULATORY_CARE_PROVIDER_SITE_OTHER): Payer: Medicare PPO | Admitting: Psychology

## 2019-04-03 DIAGNOSIS — F411 Generalized anxiety disorder: Secondary | ICD-10-CM

## 2019-04-03 DIAGNOSIS — F331 Major depressive disorder, recurrent, moderate: Secondary | ICD-10-CM

## 2019-04-10 ENCOUNTER — Encounter: Payer: Self-pay | Admitting: Family Medicine

## 2019-04-10 ENCOUNTER — Other Ambulatory Visit: Payer: Self-pay

## 2019-04-10 ENCOUNTER — Ambulatory Visit: Payer: Medicare Other | Admitting: Family Medicine

## 2019-04-10 ENCOUNTER — Other Ambulatory Visit: Payer: Self-pay | Admitting: Obstetrics and Gynecology

## 2019-04-10 VITALS — BP 133/86 | HR 72 | Temp 98.0°F | Resp 16 | Ht 65.0 in | Wt 122.2 lb

## 2019-04-10 DIAGNOSIS — Z1231 Encounter for screening mammogram for malignant neoplasm of breast: Secondary | ICD-10-CM

## 2019-04-10 DIAGNOSIS — E039 Hypothyroidism, unspecified: Secondary | ICD-10-CM

## 2019-04-10 DIAGNOSIS — R42 Dizziness and giddiness: Secondary | ICD-10-CM | POA: Diagnosis not present

## 2019-04-10 LAB — TSH: TSH: 2.89 u[IU]/mL (ref 0.35–4.50)

## 2019-04-10 LAB — CBC
HCT: 42 % (ref 36.0–46.0)
Hemoglobin: 14.2 g/dL (ref 12.0–15.0)
MCHC: 33.8 g/dL (ref 30.0–36.0)
MCV: 85.8 fl (ref 78.0–100.0)
Platelets: 259 10*3/uL (ref 150.0–400.0)
RBC: 4.9 Mil/uL (ref 3.87–5.11)
RDW: 12.7 % (ref 11.5–15.5)
WBC: 9.6 10*3/uL (ref 4.0–10.5)

## 2019-04-10 LAB — BASIC METABOLIC PANEL
BUN: 16 mg/dL (ref 6–23)
CO2: 27 mEq/L (ref 19–32)
Calcium: 9.6 mg/dL (ref 8.4–10.5)
Chloride: 99 mEq/L (ref 96–112)
Creatinine, Ser: 0.82 mg/dL (ref 0.40–1.20)
GFR: 68.44 mL/min (ref >60.00)
Glucose, Bld: 107 mg/dL — ABNORMAL HIGH (ref 70–99)
Potassium: 4.1 mEq/L (ref 3.5–5.1)
Sodium: 136 mEq/L (ref 135–145)

## 2019-04-10 NOTE — Progress Notes (Signed)
This visit occurred during the SARS-CoV-2 public health emergency.  Safety protocols were in place, including screening questions prior to the visit, additional usage of staff PPE, and extensive cleaning of exam room while observing appropriate contact time as indicated for disinfecting solutions.    Yvette Short , 1946/03/09, 74 y.o., female MRN: [id number] Patient Care Team    Relationship Specialty Notifications Start End  Ma Hillock, DO PCP - General Family Medicine  08/03/17   Dian Queen, MD Consulting Physician Obstetrics and Gynecology  08/03/17   Tanda Rockers, MD Consulting Physician Pulmonary Disease  08/03/17   Lyndal Pulley, DO Consulting Physician Sports Medicine  08/03/17   Celedonio Miyamoto, MD Referring Physician Gastroenterology  08/03/17   Marica Otter, [place]  Optometry  08/03/17     Chief Complaint  Patient presents with  . Fall    Pt had fall on carpeted floor. Pain in lower back right after fall.      Subjective: Pt presents for an OV with complaints of lower back discomfort after fall that occurred yesterday at 4 PM.  Patient reports she is uncertain if she became mildly dizzy or lost her footing.  She does endorse waking up from a nap and was still a little groggy when she fell onto a carpeted area off of her bedroom.  States she was able to stand up immediately without any symptoms.  Since that time she has had some mild lower back discomfort points to her sacral area.  She took Tylenol for discomfort last night and it was helpful.  She has a history of spinal stenosis under treatment with Dr. Tamala Julian.  She is concerned that she is getting dizzy.  She states she thinks she might have been mildly dizzy yesterday and she had an occurrence over the summer in which she stated she "could not walk straight ".  The occurrence in the summertime was after walking in the park on a hot day.  She states she felt like she was veering left and right and unable to walk  straight.  She reports those symptoms lasted about 15 minutes and resolved after she was able to eat and drink.    Depression screen [facility] 2/9 02/07/2019 08/03/2017 05/07/2013  Decreased Interest 1 0 0  Down, Depressed, Hopeless 0 0 0  PHQ - 2 Score 1 0 0    Allergies  Allergen Reactions  . Tetracycline Hcl Rash        Social History   Social History Narrative   Married. Two children.    College grad.    Former smoker.    Exercises routinely.    Drink caffeine.    Wears a hearing aid.    Smoke alarm in the home.    Wears her seatbelt.    Feels safe in her relationships.    Past Medical History:  Diagnosis Date  . Abnormal colonoscopy 03/2017   decreased rectal tone and polyp; 5 year recall  . Abnormal findings on esophagogastroduodenoscopy (EGD) 02/25/2016   Z-line irrg. at 40 cm. No abnomrality of the esophagus to explain dysphagia. Esophagus dilated. H/o + H. Pylori  . Allergy   . ANXIETY 07/06/2007  . Biceps tendonitis on right   . Chicken pox   . Colon polyp 03/31/2017   hyperplastic  . Diverticulosis 03/2017   sigmoid  . Family history of breast cancer   . Family history of colon cancer   . Family history of ovarian  cancer   . Frequent UTI   . GERD 11/09/2006  . H. pylori infection    h/o treated wiht prevpac  . HYPERLIPIDEMIA 11/09/2006  . Hypertension   . Migraine   . Piriformis syndrome of right side   . Stomach ulcer 2008   Past Surgical History:  Procedure Laterality Date  . CERVICAL POLYPECTOMY  01/17/2019  . LIPOMA EXCISION     of back  . TUBAL LIGATION     Family History  Problem Relation Age of Onset  . Dementia Mother   . Hyperlipidemia Mother   . Colon cancer Father 73  . Colon polyps Brother        gets colonoscopy every 2-3 years  . Hyperlipidemia Brother   . Hypertension Brother   . Uterine cancer Maternal Aunt        dx in her 16s  . Colon cancer Maternal Uncle   . Colon cancer Paternal Aunt   . Heart attack Maternal Grandfather   .  Cancer Other        MGMs sister - "abdominal cancer"  . Breast cancer Cousin    Allergies as of 04/10/2019      Reactions   Tetracycline Hcl Rash          Medication List       Accurate as of April 10, 2019  5:28 PM. If you have any questions, ask your nurse or doctor.        amLODipine 10 MG tablet Commonly known as: NORVASC Take 1 tablet (10 mg total) by mouth daily.   ascorbic acid 250 MG Chew Commonly known as: VITAMIN C Chew 250 mg by mouth daily.   atorvastatin 20 MG tablet Commonly known as: LIPITOR Take 1 tablet (20 mg total) by mouth daily.   Azelaic Acid 15 % cream APPLY TO AFFECTED AREA ON FACE UP TO TWO TIMES A DAY AS NEEDED   chlorpheniramine 4 MG tablet Commonly known as: CHLOR-TRIMETON Take 4 mg by mouth every 4 (four) hours as needed for allergies.   estradiol 0.05 MG/24HR patch Commonly known as: VIVELLE-DOT estradiol 0.05 mg/24 hr semiweekly transdermal patch  APPLY 1 PATCH TWICE A WEEK   gabapentin 100 MG capsule Commonly known as: NEURONTIN Take 2-3 capsules (200-300 mg total) by mouth at bedtime.   levothyroxine 88 MCG tablet Commonly known as: SYNTHROID Take 88 mcg by mouth daily.   losartan 25 MG tablet Commonly known as: COZAAR Take 1 tablet (25 mg total) by mouth daily.   omeprazole 40 MG capsule Commonly known as: PRILOSEC Take 1 capsule (40 mg total) by mouth daily. Take 30- 60 min before your first meal   progesterone 100 MG capsule Commonly known as: PROMETRIUM TAKE 2 CAPSULE(S) EVERY DAY BY ORAL ROUTE FOR 12 DAYS.   venlafaxine XR 37.5 MG 24 hr capsule Commonly known as: EFFEXOR-XR TAKE 1 CAPSULE BY MOUTH DAILY WITH BREAKFAST.   Vitamin D2 50 MCG (2000 UT) Tabs Take 1 tablet by mouth daily.       All past medical history, surgical history, allergies, family history, immunizations andmedications were updated in the EMR today and reviewed under the history and medication portions of their EMR.     ROS: Negative,  with the exception of above mentioned in HPI   Objective:  BP 133/86 (BP Location: Left Arm, Patient Position: Sitting, Cuff Size: Normal)   Pulse 72   Temp 98 F (36.7 C) (Temporal)   Resp 16   Ht 5\' 5"  (  1.651 m)   Wt 122 lb 4 oz (55.5 kg)   SpO2 96%   BMI 20.34 kg/m  Body mass index is 20.34 kg/m. Gen: Afebrile. No acute distress. Nontoxic in appearance, well developed, well nourished.  HENT: AT. [place].  Eyes:Pupils Equal Round Reactive to light, Extraocular movements intact,  Conjunctiva without redness, discharge or icterus. CV: RRR  Chest: CTAB, no wheeze or crackles.  MSK: No soft tissue swelling over sacral area.  2 very small wound bruises on right side of sacrum.  Tender to palpation over this area.  SI joints are nontender.  No bony tenderness lumbar spine. Neuro: Normal gait. PERLA. EOMi. Alert. Oriented x3  Psych: Normal affect, dress and demeanor. Normal speech. Normal thought content and judgment.  No exam data present No results found. Results for orders placed or performed in visit on 04/10/19 (from the past 24 hour(s))  CBC     Status: None   Collection Time: 04/10/19 12:05 PM  Result Value Ref Range   WBC 9.6 4.0 - 10.5 K/uL   RBC 4.90 3.87 - 5.11 Mil/uL   Platelets 259.0 150.0 - 400.0 K/uL   Hemoglobin 14.2 12.0 - 15.0 g/dL   HCT 42.0 36.0 - 46.0 %   MCV 85.8 78.0 - 100.0 fl   MCHC 33.8 30.0 - 36.0 g/dL   RDW 12.7 11.5 - [id number] %  Basic Metabolic Panel (BMET)     Status: Abnormal   Collection Time: 04/10/19 12:05 PM  Result Value Ref Range   Sodium 136 135 - 145 mEq/L   Potassium 4.1 3.5 - 5.1 mEq/L   Chloride 99 96 - 112 mEq/L   CO2 27 19 - 32 mEq/L   Glucose, Bld 107 (H) 70 - 99 mg/dL   BUN 16 6 - 23 mg/dL   Creatinine, Ser 0.82 0.40 - 1.20 mg/dL   GFR 68.44 >60.00 mL/min   Calcium 9.6 8.4 - 10.5 mg/dL  TSH     Status: None   Collection Time: 04/10/19 12:05 PM  Result Value Ref Range   TSH 2.89 0.35 - 4.50 uIU/mL    Assessment/Plan: Yvette Short is a 74 y.o. female present for OV for  Dizziness/Acquired hypothyroidism/fall Patient has had 2 occurrences of dizziness over the last 6 months.  She has difficulty eating and drinking secondary to her GI issues, of which she is under treatment.  Suspect electrolyte imbalance versus dehydration may be contributing into some of her dizziness.  Does not seem to be any syncopal episodes or recurrent/frequent episodes.  No other cardiac or neurological complaints/symptoms with occurrence of these events. -For her fall would encourage her to ice and use anti-inflammatory for comfort.  Do not believe she needs any x-rays or further studies given her exam today.  She does have 2 small bruises at the area of tenderness on her right sacral area. Rule out anemia, electrolyte imbalance and abnormal thyroid function as cause of possible dizziness. -Weight has been stable and actually increased 5-7 pounds since May. - CBC - Basic Metabolic Panel (BMET) - TSH Patient encouraged to hydrate with water and ensure she is obtaining proper nutrients by adding either Ensure or boost to her daily regimen.  You can also consider Gatorade 2 to help with electrolyte levels. Follow-up as needed, sooner if labs indicate need.   Reviewed expectations re: course of current medical issues.  Discussed self-management of symptoms.  Outlined signs and symptoms indicating need for more acute intervention.  Patient verbalized understanding  and all questions were answered.  Patient received an After-Visit Summary.   Orders Placed This Encounter  Procedures  . CBC  . Basic Metabolic Panel (BMET)  . TSH  No orders of the defined types were placed in this encounter.    Note is dictated utilizing voice recognition software. Although note has been proof read prior to signing, occasional typographical errors still can be missed. If any questions arise, please do not hesitate to call for verification.    electronically signed by:  Howard Pouch, DO  [place]

## 2019-04-10 NOTE — Patient Instructions (Signed)
We will call you with lab results.  Gatorade 2 and or boost supplement drinks.  HYDRATE  Use ice and advil for comfort.     Dizziness Dizziness is a common problem. It makes you feel unsteady or light-headed. You may feel like you are about to pass out (faint). Dizziness can lead to getting hurt if you stumble or fall. Dizziness can be caused by many things, including:  Medicines.  Not having enough water in your body (dehydration).  Illness. Follow these instructions at home: Eating and drinking   Drink enough fluid to keep your pee (urine) clear or pale yellow. This helps to keep you from getting dehydrated. Try to drink more clear fluids, such as water.  Do not drink alcohol.  Limit how much caffeine you drink or eat, if your doctor tells you to do that.  Limit how much salt (sodium) you drink or eat, if your doctor tells you to do that. Activity   Avoid making quick movements. ? When you stand up from sitting in a chair, steady yourself until you feel okay. ? In the morning, first sit up on the side of the bed. When you feel okay, stand slowly while you hold onto something. Do this until you know that your balance is fine.  If you need to stand in one place for a long time, move your legs often. Tighten and relax the muscles in your legs while you are standing.  Do not drive or use heavy machinery if you feel dizzy.  Avoid bending down if you feel dizzy. Place items in your home so you can reach them easily without leaning over. Lifestyle  Do not use any products that contain nicotine or tobacco, such as cigarettes and e-cigarettes. If you need help quitting, ask your doctor.  Try to lower your stress level. You can do this by using methods such as yoga or meditation. Talk with your doctor if you need help. General instructions  Watch your dizziness for any changes.  Take over-the-counter and prescription medicines only as told by your doctor. Talk with your doctor  if you think that you are dizzy because of a medicine that you are taking.  Tell a friend or a family member that you are feeling dizzy. If he or she notices any changes in your behavior, have this person call your doctor.  Keep all follow-up visits as told by your doctor. This is important. Contact a doctor if:  Your dizziness does not go away.  Your dizziness or light-headedness gets worse.  You feel sick to your stomach (nauseous).  You have trouble hearing.  You have new symptoms.  You are unsteady on your feet.  You feel like the room is spinning. Get help right away if:  You throw up (vomit) or have watery poop (diarrhea), and you cannot eat or drink anything.  You have trouble: ? Talking. ? Walking. ? Swallowing. ? Using your arms, hands, or legs.  You feel generally weak.  You are not thinking clearly, or you have trouble forming sentences. A friend or family member may notice this.  You have: ? Chest pain. ? Pain in your belly (abdomen). ? Shortness of breath. ? Sweating.  Your vision changes.  You are bleeding.  You have a very bad headache.  You have neck pain or a stiff neck.  You have a fever. These symptoms may be an emergency. Do not wait to see if the symptoms will go away. Get medical help  right away. Call your local emergency services (911 in the [place] Do not drive yourself to the hospital. Summary  Dizziness makes you feel unsteady or light-headed. You may feel like you are about to pass out (faint).  Drink enough fluid to keep your pee (urine) clear or pale yellow. Do not drink alcohol.  Avoid making quick movements if you feel dizzy.  Watch your dizziness for any changes. This information is not intended to replace advice given to you by your health care provider. Make sure you discuss any questions you have with your health care provider. Document Revised: 03/18/2017 Document Reviewed: 04/01/2016 Elsevier Patient Education  2020  [place] vaccines are starting for the community.  1. Health Department: Appointments are required .Once your age bracket is called (watch news and visit website below for info) appt can be made by calling [phone] and selecting option 2.  Walk-ins will not be accepted.                  Clinic locations are: [facility], [place]; [facility] [address] [place]; . [place] at [facility], [id number], [place]. Visit [email or web address] and click on the "[id number] Vaccine Info" rectangle for more information about vaccinations.  2. [place] web page will also have up to date info on vaccinations offered through [place] at  [email or web address] - Appt also required.  - vaccines are administered at the [facility] . If website indicates you are eligible for vaccine- please call [phone]    Participants are asked to wear a face covering at vaccination sites.

## 2019-04-22 ENCOUNTER — Ambulatory Visit: Payer: Medicare PPO | Attending: Internal Medicine

## 2019-04-22 DIAGNOSIS — Z23 Encounter for immunization: Secondary | ICD-10-CM

## 2019-04-22 NOTE — Progress Notes (Signed)
   Covid-19 Vaccination Clinic  Name:  Yvette Short    MRN: [id number] DOB: 17-May-1945  04/22/2019  [name] was observed post Covid-19 immunization for 15 minutes without incidence. She was provided with Vaccine Information Sheet and instruction to access the V-Safe system.   [name] was instructed to call 911 with any severe reactions post vaccine: [facility] Difficulty breathing  . Swelling of your face and throat  . A fast heartbeat  . A bad rash all over your body  . Dizziness and weakness    Immunizations Administered    Name Date Dose VIS Date Route   Pfizer COVID-19 Vaccine 04/22/2019 10:19 AM 0.3 mL 03/09/2019 Intramuscular   Manufacturer: [place]   Lot: [id number]   [place]: [id number]

## 2019-05-01 ENCOUNTER — Other Ambulatory Visit: Payer: Self-pay | Admitting: Family Medicine

## 2019-05-03 ENCOUNTER — Ambulatory Visit (INDEPENDENT_AMBULATORY_CARE_PROVIDER_SITE_OTHER): Payer: Medicare PPO | Admitting: Psychology

## 2019-05-03 DIAGNOSIS — F331 Major depressive disorder, recurrent, moderate: Secondary | ICD-10-CM | POA: Diagnosis not present

## 2019-05-09 DIAGNOSIS — H5213 Myopia, bilateral: Secondary | ICD-10-CM | POA: Diagnosis not present

## 2019-05-09 DIAGNOSIS — H2513 Age-related nuclear cataract, bilateral: Secondary | ICD-10-CM | POA: Diagnosis not present

## 2019-05-14 ENCOUNTER — Ambulatory Visit: Payer: Medicare PPO | Attending: Internal Medicine

## 2019-05-14 DIAGNOSIS — Z23 Encounter for immunization: Secondary | ICD-10-CM

## 2019-05-14 NOTE — Progress Notes (Signed)
   Covid-19 Vaccination Clinic  Name:  Yvette Short    MRN: [id number] DOB: 1945/12/30  05/14/2019  [name] was observed post Covid-19 immunization for 15 minutes without incidence. She was provided with Vaccine Information Sheet and instruction to access the V-Safe system.   [name] was instructed to call 911 with any severe reactions post vaccine: Marland Kitchen Difficulty breathing  . Swelling of your face and throat  . A fast heartbeat  . A bad rash all over your body  . Dizziness and weakness    Immunizations Administered    Name Date Dose VIS Date Route   Pfizer COVID-19 Vaccine 05/14/2019  9:24 AM 0.3 mL 03/09/2019 Intramuscular   Manufacturer: [place]   Lot: [id number]   [place]: [id number]

## 2019-05-18 ENCOUNTER — Ambulatory Visit: Payer: Medicare PPO

## 2019-05-24 ENCOUNTER — Ambulatory Visit: Payer: Medicare PPO | Admitting: Psychology

## 2019-05-24 DIAGNOSIS — N950 Postmenopausal bleeding: Secondary | ICD-10-CM | POA: Diagnosis not present

## 2019-05-29 ENCOUNTER — Ambulatory Visit (INDEPENDENT_AMBULATORY_CARE_PROVIDER_SITE_OTHER): Payer: Medicare PPO | Admitting: Psychology

## 2019-05-29 DIAGNOSIS — F331 Major depressive disorder, recurrent, moderate: Secondary | ICD-10-CM

## 2019-06-05 LAB — HM PAP SMEAR: HM Pap smear: NORMAL

## 2019-06-06 ENCOUNTER — Encounter: Payer: Self-pay | Admitting: Family Medicine

## 2019-06-06 ENCOUNTER — Other Ambulatory Visit: Payer: Self-pay

## 2019-06-06 ENCOUNTER — Ambulatory Visit (INDEPENDENT_AMBULATORY_CARE_PROVIDER_SITE_OTHER): Payer: Medicare PPO | Admitting: Family Medicine

## 2019-06-06 DIAGNOSIS — M48062 Spinal stenosis, lumbar region with neurogenic claudication: Secondary | ICD-10-CM

## 2019-06-06 MED ORDER — VENLAFAXINE HCL ER 37.5 MG PO CP24: ORAL_CAPSULE | ORAL | 3 refills | Status: DC

## 2019-06-06 NOTE — Progress Notes (Signed)
[facility] [address] [place] Phone: [phone] Subjective:   I Yvette Short am serving as a Education administrator for Dr. Hulan Saas.  This visit occurred during the SARS-CoV-2 public health emergency.  Safety protocols were in place, including screening questions prior to the visit, additional usage of staff PPE, and extensive cleaning of exam room while observing appropriate contact time as indicated for disinfecting solutions.   I'm seeing this patient by the request  of:  Kuneff, Renee A, DO  CC: Low back pain follow-up  [id number]   03/21/2019 Doing much better with the Effexor at this time 2.  No side effects.  Continue same medication  Patient is doing remarkably better at this time.  Continue the Effexor and gabapentin at the current dose.  Declined any type of epidural at the moment.  Doing very well with the home exercises and does not feel that formal physical therapy is necessary at this time.  Follow-up with me again 2 to 3 months.  Update 06/06/2019 Yvette Short is a 74 y.o. female coming in with complaint of back pain. Patient states she is not feeling too bad. Believe she feels about the same as last visit maybe a little better.  Patient has severe spinal stenosis with degenerative scoliosis.  Patient states that she has been doing significantly better overall.  The Effexor has been significantly helpful.  Denies any true radicular symptoms.  Nothing that stops her from activity and nothing that is waking her up at night.  Able to walk long distances without significant amount of pain.  Happy with the results.       Past Medical History:  Diagnosis Date  . Abnormal colonoscopy 03/2017   decreased rectal tone and polyp; 5 year recall  . Abnormal findings on esophagogastroduodenoscopy (EGD) 02/25/2016   Z-line irrg. at 40 cm. No abnomrality of the esophagus to explain dysphagia. Esophagus dilated. H/o + H. Pylori  .  Allergy   . ANXIETY 07/06/2007  . Biceps tendonitis on right   . Chicken pox   . Colon polyp 03/31/2017   hyperplastic  . Diverticulosis 03/2017   sigmoid  . Family history of breast cancer   . Family history of colon cancer   . Family history of ovarian cancer   . Frequent UTI   . GERD 11/09/2006  . H. pylori infection    h/o treated wiht prevpac  . HYPERLIPIDEMIA 11/09/2006  . Hypertension   . Migraine   . Piriformis syndrome of right side   . Stomach ulcer 2008   Past Surgical History:  Procedure Laterality Date  . CERVICAL POLYPECTOMY  01/17/2019  . LIPOMA EXCISION     of back  . TUBAL LIGATION     Social History   Socioeconomic History  . Marital status: Married    Spouse name: Not on file  . Number of children: Not on file  . Years of education: Not on file  . Highest education level: Not on file  Occupational History  . Not on file  Tobacco Use  . Smoking status: Former Smoker    Packs/day: 2.50    Years: 13.00    Pack years: 32.50    Types: Cigarettes    Quit date: 07/28/1974    Years since quitting: 44.8  . Smokeless tobacco: Never Used  Substance and Sexual Activity  . Alcohol use: Yes    Alcohol/week: 2.0 standard drinks    Types: 2 Cans of  beer per week  . Drug use: No  . Sexual activity: Yes    Partners: Male  Other Topics Concern  . Not on file  Social History Narrative   Married. Two children.    College grad.    Former smoker.    Exercises routinely.    Drink caffeine.    Wears a hearing aid.    Smoke alarm in the home.    Wears her seatbelt.    Feels safe in her relationships.    Social Determinants of Health   Financial Resource Strain:   . Difficulty of Paying Living Expenses: Not on file  Food Insecurity:   . Worried About Charity fundraiser in the Last Year: Not on file  . Ran Out of Food in the Last Year: Not on file  Transportation Needs:   . Lack of Transportation (Medical): Not on file  . Lack of Transportation  (Non-Medical): Not on file  Physical Activity:   . Days of Exercise per Week: Not on file  . Minutes of Exercise per Session: Not on file  Stress:   . Feeling of Stress : Not on file  Social Connections:   . Frequency of Communication with Friends and Family: Not on file  . Frequency of Social Gatherings with Friends and Family: Not on file  . Attends Religious Services: Not on file  . Active Member of Clubs or Organizations: Not on file  . Attends Archivist Meetings: Not on file  . Marital Status: Not on file   Allergies  Allergen Reactions  . Tetracycline Hcl Rash        Family History  Problem Relation Age of Onset  . Dementia Mother   . Hyperlipidemia Mother   . Colon cancer Father 47  . Colon polyps Brother        gets colonoscopy every 2-3 years  . Hyperlipidemia Brother   . Hypertension Brother   . Uterine cancer Maternal Aunt        dx in her 28s  . Colon cancer Maternal Uncle   . Colon cancer Paternal Aunt   . Heart attack Maternal Grandfather   . Cancer Other        MGMs sister - "abdominal cancer"  . Breast cancer Cousin     Current Outpatient Medications (Endocrine & Metabolic):  .  estradiol (VIVELLE-DOT) 0.05 MG/24HR patch, estradiol 0.05 mg/24 hr semiweekly transdermal patch  APPLY 1 PATCH TWICE A WEEK .  levothyroxine (SYNTHROID, LEVOTHROID) 88 MCG tablet, Take 88 mcg by mouth daily. .  progesterone (PROMETRIUM) 100 MG capsule, TAKE 2 CAPSULE(S) EVERY DAY BY ORAL ROUTE FOR 12 DAYS.  Current Outpatient Medications (Cardiovascular):  .  amLODipine (NORVASC) 10 MG tablet, Take 1 tablet (10 mg total) by mouth daily. Marland Kitchen  atorvastatin (LIPITOR) 20 MG tablet, Take 1 tablet (20 mg total) by mouth daily. Marland Kitchen  losartan (COZAAR) 25 MG tablet, Take 1 tablet (25 mg total) by mouth daily.  Current Outpatient Medications (Respiratory):  .  chlorpheniramine (CHLOR-TRIMETON) 4 MG tablet, Take 4 mg by mouth every 4 (four) hours as needed for  allergies.    Current Outpatient Medications (Other):  .  ascorbic acid (VITAMIN C) 250 MG CHEW, Chew 250 mg by mouth daily. .  Azelaic Acid 15 % cream, APPLY TO AFFECTED AREA ON FACE UP TO TWO TIMES A DAY AS NEEDED .  Ergocalciferol (VITAMIN D2) 2000 units TABS, Take 1 tablet by mouth daily. Marland Kitchen  gabapentin (NEURONTIN) 100  MG capsule, TAKE 2-3 CAPSULES (200-300 MG TOTAL) BY MOUTH AT BEDTIME. Marland Kitchen  omeprazole (PRILOSEC) 40 MG capsule, Take 1 capsule (40 mg total) by mouth daily. Take 30- 60 min before your first meal .  venlafaxine XR (EFFEXOR-XR) 37.5 MG 24 hr capsule, TAKE 1 CAPSULE BY MOUTH DAILY WITH BREAKFAST.   Reviewed prior external information including notes and imaging from  primary care provider As well as notes that were available from care everywhere and other healthcare systems.  Past medical history, social, surgical and family history all reviewed in electronic medical record.  No pertanent information unless stated regarding to the chief complaint.   Review of Systems:  No headache, visual changes, nausea, vomiting, diarrhea, constipation, dizziness, abdominal pain, skin rash, fevers, chills, night sweats, weight loss, swollen lymph nodes, body aches, joint swelling, chest pain, shortness of breath, mood changes.   Objective  Blood pressure 100/80, pulse (!) 54, height 5\' 5"  (1.651 m), weight 123 lb (55.8 kg), SpO2 98 %.   General: No apparent distress alert and oriented x3 mood and affect normal, dressed appropriately.  HEENT: Pupils equal, extraocular movements intact  Respiratory: Patient's speak in full sentences and does not appear short of breath  Cardiovascular: No lower extremity edema, non tender, no erythema  Skin: Warm dry intact with no signs of infection or rash on extremities or on axial skeleton.  Abdomen: Soft nontender  Neuro: Cranial nerves II through XII are intact, neurovascularly intact in all extremities with 2+ DTRs and 2+ pulses.  Lymph: No  lymphadenopathy of posterior or anterior cervical chain or axillae bilaterally.  Gait mild antalgic MSK:   Patient's low back exam shows the patient has severe loss of lordosis with degenerative scoliosis noted.  Patient has significant right-sided sidebending with a positive Trendelenburg on the right side with mild weakness of the right hip.  Patient though has a negative straight leg test but tightness with Corky Sox test bilaterally.  Deep tendon reflexes intact and neurovascular intact distally.   Impression and Recommendations:     The above documentation has been reviewed and is accurate and complete Yvette Pulley, DO       Note: This dictation was prepared with Dragon dictation along with smaller phrase technology. Any transcriptional errors that result from this process are unintentional.

## 2019-06-06 NOTE — Assessment & Plan Note (Signed)
Patient has been doing remarkably well at this time.  I think patient can hold on any type of surgical intervention.  Discussed icing regimen, home exercises, which activities to do which wants to avoid.  Patient is to increase activity slowly.  Follow-up with me again as needed.

## 2019-06-06 NOTE — Patient Instructions (Signed)
Keep doing exercises focusing on glutes Refilled Effexor See me when you need me

## 2019-06-18 ENCOUNTER — Other Ambulatory Visit: Payer: Self-pay

## 2019-06-18 ENCOUNTER — Ambulatory Visit
Admission: RE | Admit: 2019-06-18 | Discharge: 2019-06-18 | Disposition: A | Discharge status: Home / Self Care | Payer: Medicare PPO | Source: Ambulatory Visit | Attending: Obstetrics and Gynecology | Admitting: Obstetrics and Gynecology

## 2019-06-18 DIAGNOSIS — Z1231 Encounter for screening mammogram for malignant neoplasm of breast: Secondary | ICD-10-CM

## 2019-06-26 ENCOUNTER — Ambulatory Visit (INDEPENDENT_AMBULATORY_CARE_PROVIDER_SITE_OTHER): Payer: Medicare PPO | Admitting: Psychology

## 2019-06-26 DIAGNOSIS — F331 Major depressive disorder, recurrent, moderate: Secondary | ICD-10-CM

## 2019-07-16 DIAGNOSIS — H903 Sensorineural hearing loss, bilateral: Secondary | ICD-10-CM | POA: Diagnosis not present

## 2019-07-17 ENCOUNTER — Encounter: Payer: Self-pay | Admitting: Family Medicine

## 2019-07-17 ENCOUNTER — Ambulatory Visit: Payer: Medicare PPO | Admitting: Family Medicine

## 2019-07-17 ENCOUNTER — Other Ambulatory Visit: Payer: Self-pay

## 2019-07-17 VITALS — BP 103/69 | HR 71 | Temp 98.0°F | Resp 16 | Ht 65.0 in | Wt 122.0 lb

## 2019-07-17 DIAGNOSIS — R42 Dizziness and giddiness: Secondary | ICD-10-CM

## 2019-07-17 DIAGNOSIS — R002 Palpitations: Secondary | ICD-10-CM | POA: Diagnosis not present

## 2019-07-17 DIAGNOSIS — I700 Atherosclerosis of aorta: Secondary | ICD-10-CM | POA: Diagnosis not present

## 2019-07-17 DIAGNOSIS — I10 Essential (primary) hypertension: Secondary | ICD-10-CM | POA: Diagnosis not present

## 2019-07-17 DIAGNOSIS — E039 Hypothyroidism, unspecified: Secondary | ICD-10-CM

## 2019-07-17 DIAGNOSIS — K219 Gastro-esophageal reflux disease without esophagitis: Secondary | ICD-10-CM

## 2019-07-17 DIAGNOSIS — E782 Mixed hyperlipidemia: Secondary | ICD-10-CM

## 2019-07-17 MED ORDER — ATORVASTATIN CALCIUM 20 MG PO TABS: 20.0000 mg | ORAL_TABLET | Freq: Every day | ORAL | 3 refills | Status: DC

## 2019-07-17 MED ORDER — OMEPRAZOLE 40 MG PO CPDR: 40.0000 mg | DELAYED_RELEASE_CAPSULE | Freq: Every day | ORAL | 1 refills | Status: DC

## 2019-07-17 MED ORDER — AMLODIPINE BESYLATE 10 MG PO TABS: 10.0000 mg | ORAL_TABLET | Freq: Every day | ORAL | 1 refills | Status: DC

## 2019-07-17 MED ORDER — LOSARTAN POTASSIUM 25 MG PO TABS: 12.5000 mg | ORAL_TABLET | Freq: Every day | ORAL | 1 refills | Status: DC

## 2019-07-17 MED ORDER — SUCRALFATE 1 GM/10ML PO SUSP: 1.0000 g | Freq: Four times a day (QID) | ORAL | 5 refills | Status: DC

## 2019-07-17 MED ORDER — ADULT BLOOD PRESSURE CUFF LG KIT: PACK | 0 refills | Status: DC

## 2019-07-17 NOTE — Progress Notes (Addendum)
This visit occurred during the SARS-CoV-2 public health emergency.  Safety protocols were in place, including screening questions prior to the visit, additional usage of staff PPE, and extensive cleaning of exam room while observing appropriate contact time as indicated for disinfecting solutions.    Yvette Short , Sep 08, 1945, 74 y.o., female MRN: [id number] Patient Care Team    Relationship Specialty Notifications Start End  Ma Hillock, DO PCP - General Family Medicine  08/03/17   Dian Queen, MD Consulting Physician Obstetrics and Gynecology  08/03/17   Tanda Rockers, MD Consulting Physician Pulmonary Disease  08/03/17   Lyndal Pulley, DO Consulting Physician Sports Medicine  08/03/17   Celedonio Miyamoto, MD Referring Physician Gastroenterology  08/03/17   Marica Otter, [place]  Optometry  08/03/17     Chief Complaint  Patient presents with  . Dizziness    seen in January 2021- has improved some     Subjective: .anem is a 74 y.o. female present for [facility] follow up Dizziness:  Although she states hre dizziness has improved since last visit, she endorses a rather significant episode last Thursday when she was sitting on the couch. The room started spinning and she became nauseated. She states the dizziness only lasted a "couple minutes." The nausea lasted a few hours. She denies palpitations, headache, dyspnea or chest pain during that time.  Prior  Note: Pt presents for an OV with complaints of lower back discomfort after fall that occurred yesterday at 4 PM.  Patient reports she is uncertain if she became mildly dizzy or lost her footing.  She does endorse waking up from a nap and was still a little groggy when she fell onto a carpeted area off of her bedroom.  States she was able to stand up immediately without any symptoms.  Since that time she has had some mild lower back discomfort points to her sacral area.  She took Tylenol for discomfort last night and it was helpful.  She  has a history of spinal stenosis under treatment with Dr. Tamala Julian.  She is concerned that she is getting dizzy.  She states she thinks she might have been mildly dizzy yesterday and she had an occurrence over the summer in which she stated she "could not walk straight ".  The occurrence in the summertime was after walking in the park on a hot day.  She states she felt like she was veering left and right and unable to walk straight.  She reports those symptoms lasted about 15 minutes and resolved after she was able to eat and drink.  Essential hypertension/hyperlipidemia/statin use Pt reports compliancewith Amlodipine 10 mg Qd and losartan 25 mg QD. She is tolerating medication well, it has not made her chronic cough any worse. Patient denies chest pain, shortness of breath, dizziness or lower extremity edema.  She has a chronic palpitations.  Cbc, cmp UTD 03/2019 - Lipid: 07/2018 - TSH: 1/2021WNL>> ordered (med managed by gyn- send results to [facility]) Diet: low sodium Exercise: routine exercise RF: htn, hld, former smoker, fhx HD EKG 2012: Multiple PAC appreciated. 5/15/2019EKG: SR. HR 63. Frequent PAC, PR 136 QT 442. Unchanged from prior EKG.    Chronic cough/Pharyngoesophageal dysphagia/Bronchiectasis without complication (HCC)/GERD Evaluated by Pulm (Dr. Melvyn Novas), ENT and had EGD 2017 by Dr. Kenton Kingfisher. Pt reports she has had a chronic coughsince before 2016. She was originally started omeprazole after EGD. She was told she has a leaky stomach valve, but no barrett's esophagus.  Her omeprazole was increased to 80 mg a day by Dr. Melvyn Novas. He also recommended she increase gabapentin to BID, but she was unable to tolerate increase dose. She has been prescribed tessalon perles.  She has had occassions  in which food becomes stuck or slow transit to stomach. Her colonoscopy is UTD completed 03/2017 with 1 polyp. 5 year recall.  She is a former smoker.She has frequent attacks after exercise as well. - taking   Carafate (originally started by GYN))>> which she feels is working but still has cough.   Ct Chest High Resolution Result Date: 07/12/2017-->IMPRESSION: 1. Scattered minimal cylindrical and varicoid bronchiectasis with associated scattered mucoid impaction and minimal tree-in-bud opacity in both lungs, predominantly in the mid to lower lungs. Findings are stable to slightly worsened since 2015 chest CT. Findings may be due to chronic infectious bronchiolitis from atypical mycobacterial infection (MAI). 2. Two vessel coronary atherosclerosis. Aortic Atherosclerosis (ICD10-I70.0). Electronically Signed By: Ilona Sorrel M.D. On: 07/12/2017 12:56  ROS: See pertinent positives and negatives per HPI Depression screen [facility] 2/9 02/07/2019 08/03/2017 05/07/2013  Decreased Interest 1 0 0  Down, Depressed, Hopeless 0 0 0  PHQ - 2 Score 1 0 0    Allergies  Allergen Reactions  . Tetracycline Hcl Rash        Social History   Social History Narrative   Married. Two children.    College grad.    Former smoker.    Exercises routinely.    Drink caffeine.    Wears a hearing aid.    Smoke alarm in the home.    Wears her seatbelt.    Feels safe in her relationships.    Past Medical History:  Diagnosis Date  . Abnormal colonoscopy 03/2017   decreased rectal tone and polyp; 5 year recall  . Abnormal findings on esophagogastroduodenoscopy (EGD) 02/25/2016   Z-line irrg. at 40 cm. No abnomrality of the esophagus to explain dysphagia. Esophagus dilated. H/o + H. Pylori  . Allergy   . ANXIETY 07/06/2007  . Biceps tendonitis on right   . Chicken pox   . Colon polyp 03/31/2017   hyperplastic  . Diverticulosis 03/2017   sigmoid  . Family history of breast cancer   . Family history of colon cancer   . Family history of ovarian cancer   . Frequent UTI   . GERD 11/09/2006  . H. pylori infection    h/o treated wiht prevpac  . HYPERLIPIDEMIA 11/09/2006  . Hypertension   . Migraine   . Piriformis  syndrome of right side   . Stomach ulcer 2008   Past Surgical History:  Procedure Laterality Date  . CERVICAL POLYPECTOMY  01/17/2019  . LIPOMA EXCISION     of back  . TUBAL LIGATION     Family History  Problem Relation Age of Onset  . Dementia Mother   . Hyperlipidemia Mother   . Colon cancer Father 5  . Colon polyps Brother        gets colonoscopy every 2-3 years  . Hyperlipidemia Brother   . Hypertension Brother   . Uterine cancer Maternal Aunt        dx in her 63s  . Colon cancer Maternal Uncle   . Colon cancer Paternal Aunt   . Heart attack Maternal Grandfather   . Cancer Other        MGMs sister - "abdominal cancer"  . Breast cancer Cousin    Allergies as of 07/17/2019  Reactions   Tetracycline Hcl Rash          Medication List       Accurate as of July 17, 2019  9:43 AM. If you have any questions, ask your nurse or doctor.        Adult Blood Pressure Cuff Lg Kit One adult BP cuff. Monitor blood pressures once a day. Started by: Howard Pouch, DO   amLODipine 10 MG tablet Commonly known as: NORVASC Take 1 tablet (10 mg total) by mouth daily.   ascorbic acid 250 MG Chew Commonly known as: VITAMIN C Chew 250 mg by mouth daily.   atorvastatin 20 MG tablet Commonly known as: LIPITOR Take 1 tablet (20 mg total) by mouth daily.   Azelaic Acid 15 % cream APPLY TO AFFECTED AREA ON FACE UP TO TWO TIMES A DAY AS NEEDED   chlorpheniramine 4 MG tablet Commonly known as: CHLOR-TRIMETON Take 4 mg by mouth every 4 (four) hours as needed for allergies.   estradiol 0.0375 MG/24HR Commonly known as: VIVELLE-DOT Place 1 patch onto the skin 2 (two) times a week. What changed: Another medication with the same name was removed. Continue taking this medication, and follow the directions you see here. Changed by: Howard Pouch, DO   gabapentin 100 MG capsule Commonly known as: NEURONTIN TAKE 2-3 CAPSULES (200-300 MG TOTAL) BY MOUTH AT BEDTIME.    levothyroxine 88 MCG tablet Commonly known as: SYNTHROID Take 88 mcg by mouth daily.   losartan 25 MG tablet Commonly known as: COZAAR Take 0.5 tablets (12.5 mg total) by mouth daily. What changed: how much to take Changed by: Howard Pouch, DO   omeprazole 40 MG capsule Commonly known as: PRILOSEC Take 1 capsule (40 mg total) by mouth daily. Take 30- 60 min before your first meal   progesterone 100 MG capsule Commonly known as: PROMETRIUM TAKE 2 CAPSULE(S) EVERY DAY BY ORAL ROUTE FOR 12 DAYS.   sucralfate 1 GM/10ML suspension Commonly known as: CARAFATE Take 10 mLs (1 g total) by mouth in the morning, at noon, in the evening, and at bedtime. Every other day   venlafaxine XR 37.5 MG 24 hr capsule Commonly known as: EFFEXOR-XR TAKE 1 CAPSULE BY MOUTH DAILY WITH BREAKFAST.   Vitamin D2 50 MCG (2000 UT) Tabs Take 1 tablet by mouth daily.       All past medical history, surgical history, allergies, family history, immunizations andmedications were updated in the EMR today and reviewed under the history and medication portions of their EMR.     ROS: Negative, with the exception of above mentioned in HPI   Objective:  BP 103/69 (BP Location: Right Arm, Patient Position: Sitting, Cuff Size: Small)   Pulse 71   Temp 98 F (36.7 C) (Temporal)   Resp 16   Ht '5\' 5"'$  (1.651 m)   Wt 122 lb (55.3 kg)   SpO2 95%   BMI 20.30 kg/m  Body mass index is 20.3 kg/m. Gen: Afebrile. No acute distress.  HENT: AT. [place].  Eyes:Pupils Equal Round Reactive to light, Extraocular movements intact,  Conjunctiva without redness, discharge or icterus. Neck/lymp/endocrine: Supple,no lymphadenopathy, no thyromegaly CV: RRR, occ PVC noted, no murmur, no edema, +2/4 P posterior tibialis pulses Chest: CTAB, no wheeze or crackles Abd: Soft. NTND. BS present. no Masses palpated.  Skin: no rashes, purpura or petechiae.  Neuro:  Normal gait. PERLA. EOMi. Alert. Oriented x3  Psych: Normal affect, dress  and demeanor. Normal speech. Normal thought content and judgment.  No exam data present No results found. No results found for this or any previous visit (from the past 24 hour(s)).  Assessment/Plan: Yvette Short is a 74 y.o. female present for OV for  Dizziness/Acquired hypothyroidism/fall Dizziness remains, but improved per pt. Sometimes described as lightheaded and other times more vertigo in nature. On exam today PVC were noted. She has had palpitations since at least 2012- but may be increasing in frequency. She is uncertain if associated with dizziness. She is agreeable to cardio referral today for further evaluation.  -Weight has been stable - encourage hydration.  - decreased losartan dose.   Essential hypertension/hyperlipidemia/palpitations/aortic ad coronary atherosclerosis  - stable, but low normal. Possible cause of her dizziness.  - decrease losartan to 1/2 tab (12.5 mg daily). Monitor BP daily and  if dizzy. If BP < 782 systolic routinely or lower with dizziness after decreasing losartan pt encouraged to call in. Would DC losartan at that time and f/u closely.  - continue amlodipine 10 mg QD. - continue statin - Exercise: routine exercise - F/U 5.5 months.   Chronic cough/Pharyngoesophageal dysphagia/Bronchiectasis without complication (HCC)/GERD - chronic coughstarted ~2016, on high dose omeprazole. Last EGD 2017, Dr. Kenton Kingfisher - pulmonology(Dr. Wert)did not feel it was caused by lung condition. Ct abnormal w/ tree in bud.  - [facility] and Dr. Collene Mares office did not accept her bc they did not feel they could offer her more. -  Encouraged her to contiue to follow with Dr. Kenton Kingfisher and if needed could try to refer to [facility] for another opinion or [facility]>>> now on carafate and seems to be helping.  Encouraged her to restart the PPI as well as continue the Carafate.  Refilled these today. - tried albuterol before exercise>> patient reported it was not helpful. -  F/U PRN     Reviewed expectations re: course of current medical issues.  Discussed self-management of symptoms.  Outlined signs and symptoms indicating need for more acute intervention.  Patient verbalized understanding and all questions were answered.  Patient received an After-Visit Summary.   Orders Placed This Encounter  Procedures  . Ambulatory referral to Cardiology   Meds ordered this encounter  Medications  . omeprazole (PRILOSEC) 40 MG capsule    Sig: Take 1 capsule (40 mg total) by mouth daily. Take 30- 60 min before your first meal    Dispense:  90 capsule    Refill:  1  . atorvastatin (LIPITOR) 20 MG tablet    Sig: Take 1 tablet (20 mg total) by mouth daily.    Dispense:  90 tablet    Refill:  3  . amLODipine (NORVASC) 10 MG tablet    Sig: Take 1 tablet (10 mg total) by mouth daily.    Dispense:  90 tablet    Refill:  1  . Blood Pressure Monitoring (ADULT BLOOD PRESSURE CUFF LG) KIT    Sig: One adult BP cuff. Monitor blood pressures once a day.    Dispense:  1 kit    Refill:  0  . losartan (COZAAR) 25 MG tablet    Sig: Take 0.5 tablets (12.5 mg total) by mouth daily.    Dispense:  90 tablet    Refill:  1  . sucralfate (CARAFATE) 1 GM/10ML suspension    Sig: Take 10 mLs (1 g total) by mouth in the morning, at noon, in the evening, and at bedtime. Every other day    Dispense:  420 mL    Refill:  5  Note is dictated utilizing voice recognition software. Although note has been proof read prior to signing, occasional typographical errors still can be missed. If any questions arise, please do not hesitate to call for verification.   electronically signed by:  Howard Pouch, DO  [place]

## 2019-07-17 NOTE — Addendum Note (Signed)
Addended by: Howard Pouch A on: 07/17/2019 09:43 AM   Modules accepted: Orders

## 2019-07-17 NOTE — Patient Instructions (Signed)
Decrease your losartan to 1/2 tab daily. Check BP daily (make sure sitting down, rested and at least 2 hours after medications). Goal is 110-130/60-80 and no dizziness. If routinely outside this parameter please call in and we will guide you.    I have referred you to cardiology to further evaluate your heart rhythm and if it is associated with your dizziness. They will call you to schedule.

## 2019-07-17 NOTE — Progress Notes (Signed)
Pre visit review using our clinic review tool, if applicable. No additional management support is needed unless otherwise documented below in the visit note. 

## 2019-07-18 ENCOUNTER — Ambulatory Visit: Payer: Medicare PPO | Admitting: Psychology

## 2019-07-19 ENCOUNTER — Encounter: Payer: Self-pay | Admitting: Cardiology

## 2019-07-19 ENCOUNTER — Telehealth: Payer: Self-pay | Admitting: Cardiology

## 2019-07-19 ENCOUNTER — Other Ambulatory Visit: Payer: Self-pay

## 2019-07-19 ENCOUNTER — Ambulatory Visit: Payer: Medicare PPO | Admitting: Cardiology

## 2019-07-19 VITALS — BP 134/84 | HR 73 | Ht 65.0 in | Wt 125.0 lb

## 2019-07-19 DIAGNOSIS — E782 Mixed hyperlipidemia: Secondary | ICD-10-CM | POA: Diagnosis not present

## 2019-07-19 DIAGNOSIS — R42 Dizziness and giddiness: Secondary | ICD-10-CM | POA: Diagnosis not present

## 2019-07-19 DIAGNOSIS — I491 Atrial premature depolarization: Secondary | ICD-10-CM | POA: Diagnosis not present

## 2019-07-19 DIAGNOSIS — I10 Essential (primary) hypertension: Secondary | ICD-10-CM | POA: Diagnosis not present

## 2019-07-19 DIAGNOSIS — I2510 Atherosclerotic heart disease of native coronary artery without angina pectoris: Secondary | ICD-10-CM

## 2019-07-19 DIAGNOSIS — R55 Syncope and collapse: Secondary | ICD-10-CM | POA: Diagnosis not present

## 2019-07-19 NOTE — Progress Notes (Signed)
Cardiology Office Note:    Date:  07/19/2019   ID:  Yvette Short, DOB Sep 05, 1945, MRN [id number]  PCP:  Ma Hillock, DO  Cardiologist:  No primary care provider on file.  Electrophysiologist:  None   Referring MD: Ma Hillock, DO   Chief Complaint  Patient presents with  . New Patient (Initial Visit)    History of Present Illness:    Yvette Short is a 74 y.o. female with a hx of hypertension, hyperlipidemia, hypothyroidism presents today to be evaluated for dizziness.  Patient tells me that back in November she had an episode where she was standing and with no movement in those signs and symptoms she fell to the ground.  She is not sure if she passed out.  But recently she notes that she has been getting dizzy a lot.  She states that last week she had an episode of dizziness where every time she is stood up she felt significantly woozy and was unable to do anything at home she therefore laid in the bed all day.  Earlier this week she had an episode of dizziness however this time it lasted for close to 15 minutes and rest resolve itself.  She notes that nothing makes it better or worse.    She denies any chest pain, shortness of breath.   Past Medical History:  Diagnosis Date  . Abnormal colonoscopy 03/2017   decreased rectal tone and polyp; 5 year recall  . Abnormal findings on esophagogastroduodenoscopy (EGD) 02/25/2016   Z-line irrg. at 40 cm. No abnomrality of the esophagus to explain dysphagia. Esophagus dilated. H/o + H. Pylori  . Allergy   . ANXIETY 07/06/2007  . Biceps tendonitis on right   . Chicken pox   . Colon polyp 03/31/2017   hyperplastic  . Diverticulosis 03/2017   sigmoid  . Family history of breast cancer   . Family history of colon cancer   . Family history of ovarian cancer   . Frequent UTI   . GERD 11/09/2006  . H. pylori infection    h/o treated wiht prevpac  . HYPERLIPIDEMIA 11/09/2006  . Hypertension   . Migraine   . Piriformis  syndrome of right side   . Stomach ulcer 2008    Past Surgical History:  Procedure Laterality Date  . CERVICAL POLYPECTOMY  01/17/2019  . LIPOMA EXCISION     of back  . TUBAL LIGATION      Current Medications: Current Meds  Medication Sig  . amLODipine (NORVASC) 10 MG tablet Take 1 tablet (10 mg total) by mouth daily.  Marland Kitchen atorvastatin (LIPITOR) 20 MG tablet Take 1 tablet (20 mg total) by mouth daily.  . Azelaic Acid 15 % cream APPLY TO AFFECTED AREA ON FACE UP TO TWO TIMES A DAY AS NEEDED  . Blood Pressure Monitoring (ADULT BLOOD PRESSURE CUFF LG) KIT One adult BP cuff. Monitor blood pressures once a day.  . chlorpheniramine (CHLOR-TRIMETON) 4 MG tablet Take 4 mg by mouth every 4 (four) hours as needed for allergies.  . Ergocalciferol (VITAMIN D2) 2000 units TABS Take 1 tablet by mouth daily.  Marland Kitchen estradiol (VIVELLE-DOT) 0.0375 MG/24HR Place 1 patch onto the skin 2 (two) times a week.  . gabapentin (NEURONTIN) 100 MG capsule TAKE 2-3 CAPSULES (200-300 MG TOTAL) BY MOUTH AT BEDTIME.  Marland Kitchen levothyroxine (SYNTHROID, LEVOTHROID) 88 MCG tablet Take 88 mcg by mouth daily.  Marland Kitchen losartan (COZAAR) 25 MG tablet Take 0.5 tablets (12.5 mg total) by  mouth daily.  Marland Kitchen omeprazole (PRILOSEC) 40 MG capsule Take 1 capsule (40 mg total) by mouth daily. Take 30- 60 min before your first meal  . progesterone (PROMETRIUM) 100 MG capsule Take by mouth.  . sucralfate (CARAFATE) 1 GM/10ML suspension Take 10 mLs (1 g total) by mouth in the morning, at noon, in the evening, and at bedtime. Every other day  . venlafaxine XR (EFFEXOR-XR) 37.5 MG 24 hr capsule TAKE 1 CAPSULE BY MOUTH DAILY WITH BREAKFAST.     Allergies:   Tetracycline hcl   Social History   Socioeconomic History  . Marital status: Married    Spouse name: Not on file  . Number of children: Not on file  . Years of education: Not on file  . Highest education level: Not on file  Occupational History  . Not on file  Tobacco Use  . Smoking status:  Former Smoker    Packs/day: 2.50    Years: 13.00    Pack years: 32.50    Types: Cigarettes    Quit date: 07/28/1974    Years since quitting: 45.0  . Smokeless tobacco: Never Used  Substance and Sexual Activity  . Alcohol use: Yes    Alcohol/week: 2.0 standard drinks    Types: 2 Cans of beer per week  . Drug use: No  . Sexual activity: Yes    Partners: Male  Other Topics Concern  . Not on file  Social History Narrative   Married. Two children.    College grad.    Former smoker.    Exercises routinely.    Drink caffeine.    Wears a hearing aid.    Smoke alarm in the home.    Wears her seatbelt.    Feels safe in her relationships.    Social Determinants of Health   Financial Resource Strain:   . Difficulty of Paying Living Expenses:   Food Insecurity:   . Worried About Charity fundraiser in the Last Year:   . Arboriculturist in the Last Year:   Transportation Needs:   . Film/video editor (Medical):   Marland Kitchen Lack of Transportation (Non-Medical):   Physical Activity:   . Days of Exercise per Week:   . Minutes of Exercise per Session:   Stress:   . Feeling of Stress :   Social Connections:   . Frequency of Communication with Friends and Family:   . Frequency of Social Gatherings with Friends and Family:   . Attends Religious Services:   . Active Member of Clubs or Organizations:   . Attends Archivist Meetings:   Marland Kitchen Marital Status:      Family History: The patient's family history includes Breast cancer in her cousin; Cancer in an other family member; Colon cancer in her maternal uncle and paternal aunt; Colon cancer (age of onset: 75) in her father; Colon polyps in her brother; Dementia in her mother; Heart attack in her maternal grandfather; Hyperlipidemia in her brother and mother; Hypertension in her brother; Uterine cancer in her maternal aunt.  ROS:   Review of Systems  Constitution: Negative for decreased appetite, fever and weight gain.  HENT:  Negative for congestion, ear discharge, hoarse voice and sore throat.   Eyes: Negative for discharge, redness, vision loss in right eye and visual halos.  Cardiovascular: Negative for chest pain, dyspnea on exertion, leg swelling, orthopnea and palpitations.  Respiratory: Negative for cough, hemoptysis, shortness of breath and snoring.   Endocrine: Negative for  heat intolerance and polyphagia.  Hematologic/Lymphatic: Negative for bleeding problem. Does not bruise/bleed easily.  Skin: Negative for flushing, nail changes, rash and suspicious lesions.  Musculoskeletal: Negative for arthritis, joint pain, muscle cramps, myalgias, neck pain and stiffness.  Gastrointestinal: Negative for abdominal pain, bowel incontinence, diarrhea and excessive appetite.  Genitourinary: Negative for decreased libido, genital sores and incomplete emptying.  Neurological: Negative for brief paralysis, focal weakness, headaches and loss of balance.  Psychiatric/Behavioral: Negative for altered mental status, depression and suicidal ideas.  Allergic/Immunologic: Negative for HIV exposure and persistent infections.    EKGs/Labs/Other Studies Reviewed:    The following studies were reviewed today:   EKG:  The ekg ordered today demonstrates sinus rhythm, heart rate 73 bpm with  PACs.  Compared to EKG done in May 2019 no significant change.  Recent Labs: 08/22/2018: ALT 21 04/10/2019: BUN 16; Creatinine, Ser 0.82; Hemoglobin 14.2; Platelets 259.0; Potassium 4.1; Sodium 136; TSH 2.89  Recent Lipid Panel    Component Value Date/Time   CHOL 171 08/22/2018 0924   TRIG 97.0 08/22/2018 0924   HDL 71.50 08/22/2018 0924   CHOLHDL 2 08/22/2018 0924   VLDL 19.4 08/22/2018 0924   LDLCALC 80 08/22/2018 0924   LDLDIRECT 158.2 05/07/2013 0928    Physical Exam:    VS:  BP 134/84   Pulse 73   Ht '5\' 5"'  (1.651 m)   Wt 125 lb (56.7 kg)   BMI 20.80 kg/m     Wt Readings from Last 3 Encounters:  07/19/19 125 lb (56.7 kg)    07/17/19 122 lb (55.3 kg)  06/06/19 123 lb (55.8 kg)     GEN: Well nourished, well developed in no acute distress HEENT: Normal NECK: No JVD; No carotid bruits LYMPHATICS: No lymphadenopathy CARDIAC: S1S2 noted,RRR, no murmurs, rubs, gallops RESPIRATORY:  Clear to auscultation without rales, wheezing or rhonchi  ABDOMEN: Soft, non-tender, non-distended, +bowel sounds, no guarding. EXTREMITIES: No edema, No cyanosis, no clubbing MUSCULOSKELETAL:  No deformity  SKIN: Warm and dry NEUROLOGIC:  Alert and oriented x 3, non-focal PSYCHIATRIC:  Normal affect, good insight  ASSESSMENT:    1. Dizziness   2. Syncope, unspecified syncope type   3. Essential hypertension   4. Mixed hyperlipidemia   5. PAC (premature atrial contraction)   6. Atherosclerosis of coronary artery seen on CT chest     PLAN:     I would like to rule out a cardiovascular etiology of her suspected syncope/dizziness therefore at this time I would like to placed a zio patch for 14 days days. In additon a transthoracic echocardiogram will be ordered to assess LV/RV function and any structural abnormalities. Once these testing have been performed amd reviewed further reccomendations will be made. For now, I do reccomend that the patient goes to the nearest ED if  symptoms recur.  Hypertension -blood pressures acceptable at this time.  Tinea current medication regimen  Hyperlipidemia continue patient on her statin medication.  CT scan in April 2019 show evidence of coronary arthrosclerosis.  She has no anginal symptoms at this time.  We will continue to monitor the patient.  She will remain on atorvastatin 20 mg daily.  If leukoplakia change I recommend we pursue a coronary CTA.  Blood work will be done today to assess for CBC and TSH.   PACs on EKG-asymptomatic we will continue to monitor.  ZIO monitor will also give [place] information on PAC burden.  The patient is in agreement with the above plan. The patient left  the office in stable condition.  The patient will follow up in 3 months or sooner if needed.    Medication Adjustments/Labs and Tests Ordered: Current medicines are reviewed at length with the patient today.  Concerns regarding medicines are outlined above.  Orders Placed This Encounter  Procedures  . CBC  . TSH  . LONG TERM MONITOR (3-14 DAYS)  . EKG 12-Lead  . ECHOCARDIOGRAM COMPLETE   No orders of the defined types were placed in this encounter.   Patient Instructions  Medication Instructions:  Your physician recommends that you continue on your current medications as directed. Please refer to the Current Medication list given to you today.  *If you need a refill on your cardiac medications before your next appointment, please call your pharmacy*   Lab Work: TODAY:  CBC & TSH  If you have labs (blood work) drawn today and your tests are completely normal, you will receive your results only by: Marland Kitchen MyChart Message (if you have MyChart) OR . A paper copy in the mail If you have any lab test that is abnormal or we need to change your treatment, we will call you to review the results.   Testing/Procedures: Your physician has requested that you have an echocardiogram. Echocardiography is a painless test that uses sound waves to create images of your heart. It provides your doctor with information about the size and shape of your heart and how well your heart's chambers and valves are working. This procedure takes approximately one hour. There are no restrictions for this procedure.   ZIO XT- Long Term Monitor Instructions   Your physician has requested you wear your ZIO patch monitor  days.   This is a single patch monitor.  [employer] supplies one patch monitor per enrollment.  Additional stickers are not available.   Please do not apply patch if you will be having a Nuclear Stress Test, Echocardiogram, Cardiac CT, MRI, or Chest Xray during the time frame you would be wearing the  monitor. The patch cannot be worn during these tests.  You cannot remove and re-apply the ZIO XT patch monitor.     Applying the monitor   Shave hair from upper left chest.   Hold abrader disc by orange tab.  Rub abrader in 40 strokes over left upper chest as indicated in your monitor instructions.   Clean area with 4 enclosed alcohol pads .  Use all pads to assure are is cleaned thoroughly.  Let dry.   Apply patch as indicated in monitor instructions.  Patch will be place under collarbone on left side of chest with arrow pointing upward.   Rub patch adhesive wings for 2 minutes.Remove white label marked "1".  Remove white label marked "2".  Rub patch adhesive wings for 2 additional minutes.   While looking in a mirror, press and release button in center of patch.  A small green light will flash 3-4 times .  This will be your only indicator the monitor has been turned on.     Do not shower for the first 24 hours.  You may shower after the first 24 hours.   Press button if you feel a symptom. You will hear a small click.  Record Date, Time and Symptom in the Patient Log Book.   When you are ready to remove patch, follow instructions on last 2 pages of Patient Log Book.  Stick patch monitor onto last page of Patient Log Book.   Place Patient Log Book  in [place] box.  Use locking tab on box and tape box closed securely.  The [employer] has [employer] on it.  Please place in mailbox as soon as possible.  Your physician should have your test results approximately 7 days after the monitor has been mailed back to [facility].   Call [place] at [phone] if you have questions regarding your ZIO XT patch monitor.  Call them immediately if you see an orange light blinking on your monitor.   If your monitor falls off in less than 4 days contact our Monitor department at [phone].  If your monitor becomes loose or falls off after 4 days call [employer] at  [phone] for suggestions on securing your monitor.     Follow-Up: At [facility], you and your health needs are our priority.  As part of our continuing mission to provide you with exceptional heart care, we have created designated Provider Care Teams.  These Care Teams include your primary Cardiologist (physician) and Advanced Practice Providers (APPs -  Physician Assistants and Nurse Practitioners) who all work together to provide you with the care you need, when you need it.  We recommend signing up for the patient portal called "MyChart".  Sign up information is provided on this After Visit Summary.  MyChart is used to connect with patients for Virtual Visits (Telemedicine).  Patients are able to view lab/test results, encounter notes, upcoming appointments, etc.  Non-urgent messages can be sent to your provider as well.   To learn more about what you can do with MyChart, go to [email or web address].    Your next appointment:   3 month(s)  The format for your next appointment:   In Person  Provider:   Berniece Salines, DO   Other Instructions  Echocardiogram An echocardiogram is a procedure that uses painless sound waves (ultrasound) to produce an image of the heart. Images from an echocardiogram can provide important information about:  Signs of coronary artery disease (CAD).  Aneurysm detection. An aneurysm is a weak or damaged part of an artery wall that bulges out from the normal force of blood pumping through the body.  Heart size and shape. Changes in the size or shape of the heart can be associated with certain conditions, including heart failure, aneurysm, and CAD.  Heart muscle function.  Heart valve function.  Signs of a past heart attack.  Fluid buildup around the heart.  Thickening of the heart muscle.  A tumor or infectious growth around the heart valves. Tell a health care provider about:  Any allergies you have.  All medicines you are taking,  including vitamins, herbs, eye drops, creams, and over-the-counter medicines.  Any blood disorders you have.  Any surgeries you have had.  Any medical conditions you have.  Whether you are pregnant or may be pregnant. What are the risks? Generally, this is a safe procedure. However, problems may occur, including:  Allergic reaction to dye (contrast) that may be used during the procedure. What happens before the procedure? No specific preparation is needed. You may eat and drink normally. What happens during the procedure?   An IV tube may be inserted into one of your veins.  You may receive contrast through this tube. A contrast is an injection that improves the quality of the pictures from your heart.  A gel will be applied to your chest.  A wand-like tool (transducer) will be moved over your chest. The gel will help to transmit the  sound waves from the transducer.  The sound waves will harmlessly bounce off of your heart to allow the heart images to be captured in real-time motion. The images will be recorded on a computer. The procedure may vary among health care providers and hospitals. What happens after the procedure?  You may return to your normal, everyday life, including diet, activities, and medicines, unless your health care provider tells you not to do that. Summary  An echocardiogram is a procedure that uses painless sound waves (ultrasound) to produce an image of the heart.  Images from an echocardiogram can provide important information about the size and shape of your heart, heart muscle function, heart valve function, and fluid buildup around your heart.  You do not need to do anything to prepare before this procedure. You may eat and drink normally.  After the echocardiogram is completed, you may return to your normal, everyday life, unless your health care provider tells you not to do that. This information is not intended to replace advice given to you by  your health care provider. Make sure you discuss any questions you have with your health care provider. Document Revised: 07/06/2018 Document Reviewed: 04/17/2016 Elsevier Patient Education  [place].      Adopting a Healthy Lifestyle.  Know what a healthy weight is for you (roughly BMI <25) and aim to maintain this   Aim for 7+ servings of fruits and vegetables daily   65-80+ fluid ounces of water or unsweet tea for healthy kidneys   Limit to max 1 drink of alcohol per day; avoid smoking/tobacco   Limit animal fats in diet for cholesterol and heart health - choose grass fed whenever available   Avoid highly processed foods, and foods high in saturated/trans fats   Aim for low stress - take time to unwind and care for your mental health   Aim for 150 min of moderate intensity exercise weekly for heart health, and weights twice weekly for bone health   Aim for 7-9 hours of sleep daily   When it comes to diets, agreement about the perfect plan isnt easy to find, even among the experts. Experts at the [place] developed an idea known as the Healthy Eating Plate. Just imagine a plate divided into logical, healthy portions.   The emphasis is on diet quality:   Load up on vegetables and fruits - one-half of your plate: Aim for color and variety, and remember that potatoes dont count.   Go for whole grains - one-quarter of your plate: Whole wheat, barley, wheat berries, quinoa, oats, brown rice, and foods made with them. If you want pasta, go with whole wheat pasta.   Protein power - one-quarter of your plate: Fish, chicken, beans, and nuts are all healthy, versatile protein sources. Limit red meat.   The diet, however, does go beyond the plate, offering a few other suggestions.   Use healthy plant oils, such as olive, canola, soy, corn, sunflower and peanut. Check the labels, and avoid partially hydrogenated oil, which have unhealthy trans fats.     If youre thirsty, drink water. Coffee and tea are good in moderation, but skip sugary drinks and limit milk and dairy products to one or two daily servings.   The type of carbohydrate in the diet is more important than the amount. Some sources of carbohydrates, such as vegetables, fruits, whole grains, and beans-are healthier than others.   Finally, stay active  Signed, Berniece Salines,  DO  07/19/2019 9:40 AM    [place]

## 2019-07-19 NOTE — Telephone Encounter (Signed)
[name] from [facility] is calling in regards to pre authorization for this patient's echo scheduled for 07/23/19

## 2019-07-19 NOTE — Patient Instructions (Addendum)
Medication Instructions:  Your physician recommends that you continue on your current medications as directed. Please refer to the Current Medication list given to you today.  *If you need a refill on your cardiac medications before your next appointment, please call your pharmacy*   Lab Work: TODAY:  CBC & TSH  If you have labs (blood work) drawn today and your tests are completely normal, you will receive your results only by: Marland Kitchen MyChart Message (if you have MyChart) OR . A paper copy in the mail If you have any lab test that is abnormal or we need to change your treatment, we will call you to review the results.   Testing/Procedures: Your physician has requested that you have an echocardiogram. Echocardiography is a painless test that uses sound waves to create images of your heart. It provides your doctor with information about the size and shape of your heart and how well your heart's chambers and valves are working. This procedure takes approximately one hour. There are no restrictions for this procedure.   ZIO XT- Long Term Monitor Instructions   Your physician has requested you wear your ZIO patch monitor  days.   This is a single patch monitor.  [employer] supplies one patch monitor per enrollment.  Additional stickers are not available.   Please do not apply patch if you will be having a Nuclear Stress Test, Echocardiogram, Cardiac CT, MRI, or Chest Xray during the time frame you would be wearing the monitor. The patch cannot be worn during these tests.  You cannot remove and re-apply the ZIO XT patch monitor.     Applying the monitor   Shave hair from upper left chest.   Hold abrader disc by orange tab.  Rub abrader in 40 strokes over left upper chest as indicated in your monitor instructions.   Clean area with 4 enclosed alcohol pads .  Use all pads to assure are is cleaned thoroughly.  Let dry.   Apply patch as indicated in monitor instructions.  Patch will be place under  collarbone on left side of chest with arrow pointing upward.   Rub patch adhesive wings for 2 minutes.Remove white label marked "1".  Remove white label marked "2".  Rub patch adhesive wings for 2 additional minutes.   While looking in a mirror, press and release button in center of patch.  A small green light will flash 3-4 times .  This will be your only indicator the monitor has been turned on.     Do not shower for the first 24 hours.  You may shower after the first 24 hours.   Press button if you feel a symptom. You will hear a small click.  Record Date, Time and Symptom in the Patient Log Book.   When you are ready to remove patch, follow instructions on last 2 pages of Patient Log Book.  Stick patch monitor onto last page of Patient Log Book.   Place Patient Log Book in Ava box.  Use locking tab on box and tape box closed securely.  The [employer] has [employer] on it.  Please place in mailbox as soon as possible.  Your physician should have your test results approximately 7 days after the monitor has been mailed back to [facility].   Call [place] at [phone] if you have questions regarding your ZIO XT patch monitor.  Call them immediately if you see an orange light blinking on your monitor.  If your monitor falls off in less than 4 days contact our Monitor department at [phone].  If your monitor becomes loose or falls off after 4 days call [employer] at [phone] for suggestions on securing your monitor.     Follow-Up: At [facility], you and your health needs are our priority.  As part of our continuing mission to provide you with exceptional heart care, we have created designated Provider Care Teams.  These Care Teams include your primary Cardiologist (physician) and Advanced Practice Providers (APPs -  Physician Assistants and Nurse Practitioners) who all work together to provide you with the care you need, when you need  it.  We recommend signing up for the patient portal called "MyChart".  Sign up information is provided on this After Visit Summary.  MyChart is used to connect with patients for Virtual Visits (Telemedicine).  Patients are able to view lab/test results, encounter notes, upcoming appointments, etc.  Non-urgent messages can be sent to your provider as well.   To learn more about what you can do with MyChart, go to [email or web address].    Your next appointment:   3 month(s)  The format for your next appointment:   In Person  Provider:   Berniece Salines, DO   Other Instructions  Echocardiogram An echocardiogram is a procedure that uses painless sound waves (ultrasound) to produce an image of the heart. Images from an echocardiogram can provide important information about:  Signs of coronary artery disease (CAD).  Aneurysm detection. An aneurysm is a weak or damaged part of an artery wall that bulges out from the normal force of blood pumping through the body.  Heart size and shape. Changes in the size or shape of the heart can be associated with certain conditions, including heart failure, aneurysm, and CAD.  Heart muscle function.  Heart valve function.  Signs of a past heart attack.  Fluid buildup around the heart.  Thickening of the heart muscle.  A tumor or infectious growth around the heart valves. Tell a health care provider about:  Any allergies you have.  All medicines you are taking, including vitamins, herbs, eye drops, creams, and over-the-counter medicines.  Any blood disorders you have.  Any surgeries you have had.  Any medical conditions you have.  Whether you are pregnant or may be pregnant. What are the risks? Generally, this is a safe procedure. However, problems may occur, including:  Allergic reaction to dye (contrast) that may be used during the procedure. What happens before the procedure? No specific preparation is needed. You may eat and drink  normally. What happens during the procedure?   An IV tube may be inserted into one of your veins.  You may receive contrast through this tube. A contrast is an injection that improves the quality of the pictures from your heart.  A gel will be applied to your chest.  A wand-like tool (transducer) will be moved over your chest. The gel will help to transmit the sound waves from the transducer.  The sound waves will harmlessly bounce off of your heart to allow the heart images to be captured in real-time motion. The images will be recorded on a computer. The procedure may vary among health care providers and hospitals. What happens after the procedure?  You may return to your normal, everyday life, including diet, activities, and medicines, unless your health care provider tells you not to do that. Summary  An echocardiogram is a procedure that uses painless sound waves (ultrasound) to  produce an image of the heart.  Images from an echocardiogram can provide important information about the size and shape of your heart, heart muscle function, heart valve function, and fluid buildup around your heart.  You do not need to do anything to prepare before this procedure. You may eat and drink normally.  After the echocardiogram is completed, you may return to your normal, everyday life, unless your health care provider tells you not to do that. This information is not intended to replace advice given to you by your health care provider. Make sure you discuss any questions you have with your health care provider. Document Revised: 07/06/2018 Document Reviewed: 04/17/2016 Elsevier Patient Education  [place].

## 2019-07-20 NOTE — Telephone Encounter (Signed)
Tried calling [name] back but did not get an answer at this time. I did leave a voicemail asking her to pleaser return my call to discuss.

## 2019-07-23 ENCOUNTER — Ambulatory Visit (HOSPITAL_BASED_OUTPATIENT_CLINIC_OR_DEPARTMENT_OTHER)
Admission: RE | Admit: 2019-07-23 | Discharge: 2019-07-23 | Disposition: A | Discharge status: Home / Self Care | Payer: Medicare PPO | Source: Ambulatory Visit | Attending: Cardiology | Admitting: Cardiology

## 2019-07-23 ENCOUNTER — Other Ambulatory Visit: Payer: Self-pay

## 2019-07-23 DIAGNOSIS — R55 Syncope and collapse: Secondary | ICD-10-CM | POA: Diagnosis not present

## 2019-07-23 DIAGNOSIS — R42 Dizziness and giddiness: Secondary | ICD-10-CM | POA: Insufficient documentation

## 2019-07-23 NOTE — Progress Notes (Signed)
  Echocardiogram 2D Echocardiogram has been performed.  Yvette Short 07/23/2019, 1:48 PM

## 2019-07-24 ENCOUNTER — Ambulatory Visit (INDEPENDENT_AMBULATORY_CARE_PROVIDER_SITE_OTHER): Payer: Medicare PPO

## 2019-07-24 DIAGNOSIS — R42 Dizziness and giddiness: Secondary | ICD-10-CM

## 2019-07-24 DIAGNOSIS — R55 Syncope and collapse: Secondary | ICD-10-CM

## 2019-07-25 ENCOUNTER — Other Ambulatory Visit (HOSPITAL_BASED_OUTPATIENT_CLINIC_OR_DEPARTMENT_OTHER): Payer: Medicare PPO

## 2019-08-01 ENCOUNTER — Ambulatory Visit (INDEPENDENT_AMBULATORY_CARE_PROVIDER_SITE_OTHER): Payer: Medicare PPO | Admitting: Psychology

## 2019-08-01 DIAGNOSIS — F331 Major depressive disorder, recurrent, moderate: Secondary | ICD-10-CM | POA: Diagnosis not present

## 2019-08-07 ENCOUNTER — Ambulatory Visit: Payer: Medicare PPO | Admitting: Family Medicine

## 2019-08-22 DIAGNOSIS — R55 Syncope and collapse: Secondary | ICD-10-CM | POA: Diagnosis not present

## 2019-08-28 ENCOUNTER — Telehealth: Payer: Self-pay

## 2019-08-28 NOTE — Telephone Encounter (Signed)
-----   Message from Berniece Salines, DO sent at 08/25/2019  1:44 PM EDT ----- I would like to see this patient within the next week to discuss her monitor results.  Okay to force into the schedule.

## 2019-08-28 NOTE — Telephone Encounter (Signed)
Spoke to the patient just now and got her scheduled for 09/04/19 with Dr. Harriet Masson. She verbalizes understanding and does not have any other issues or concerns at this time.    Encouraged patient to call back with any questions or concerns.

## 2019-09-04 ENCOUNTER — Ambulatory Visit: Payer: Medicare PPO | Admitting: Cardiology

## 2019-09-04 ENCOUNTER — Other Ambulatory Visit: Payer: Self-pay

## 2019-09-04 ENCOUNTER — Encounter: Payer: Self-pay | Admitting: Cardiology

## 2019-09-04 VITALS — BP 120/70 | HR 64 | Ht 64.5 in | Wt 123.0 lb

## 2019-09-04 DIAGNOSIS — R079 Chest pain, unspecified: Secondary | ICD-10-CM | POA: Diagnosis not present

## 2019-09-04 DIAGNOSIS — I061 Rheumatic aortic insufficiency: Secondary | ICD-10-CM | POA: Insufficient documentation

## 2019-09-04 DIAGNOSIS — I340 Nonrheumatic mitral (valve) insufficiency: Secondary | ICD-10-CM

## 2019-09-04 DIAGNOSIS — I491 Atrial premature depolarization: Secondary | ICD-10-CM | POA: Diagnosis not present

## 2019-09-04 DIAGNOSIS — I493 Ventricular premature depolarization: Secondary | ICD-10-CM

## 2019-09-04 DIAGNOSIS — I472 Ventricular tachycardia: Secondary | ICD-10-CM

## 2019-09-04 DIAGNOSIS — I4729 Other ventricular tachycardia: Secondary | ICD-10-CM | POA: Insufficient documentation

## 2019-09-04 MED ORDER — ACEBUTOLOL HCL 200 MG PO CAPS: 200.0000 mg | ORAL_CAPSULE | Freq: Two times a day (BID) | ORAL | 2 refills | Status: DC

## 2019-09-04 NOTE — Progress Notes (Addendum)
Cardiology Office Note:    Date:  09/04/2019   ID:  [name], DOB [birth year], MRN [id number]  PCP:  Ma Hillock, DO  Cardiologist:  Berniece Salines, DO  Electrophysiologist:  None   Referring MD: Ma Hillock, DO   " I am doing well"  History of Present Illness:    Yvette Short is a 74 y.o. female with a hx of hypertension, hyperlipidemia, hypothyroidism. She initially presented on 07/19/2019 at that time she presented to be evaluated for dizziness. Based on her history, I recommended that the patient undergo a ZIO monitor as well as an echocardiogram.  In the interim the patient was able to get this testing done.  She is here for follow-up to discuss her testing result.  She has also been experiencing intermittent chest pain.  The chest pains were not frequent.  Midsternal.  Last for few seconds when occurred.  Past Medical History:  Diagnosis Date  . Abnormal colonoscopy 03/2017   decreased rectal tone and polyp; 5 year recall  . Abnormal findings on esophagogastroduodenoscopy (EGD) 02/25/2016   Z-line irrg. at 40 cm. No abnomrality of the esophagus to explain dysphagia. Esophagus dilated. H/o + H. Pylori  . Allergy   . ANXIETY 07/06/2007  . Aortic atherosclerosis ([place]) 02/08/2018  . Atherosclerosis of native coronary artery of native heart without angina pectoris 02/08/2018  . Biceps tendonitis on right   . Bronchiectasis without complication ([place]) 55/97/4163   CT 2012 IMPRESSION:  1. Apical scarring may account for the plain film abnormality.  2. A 6 mm right upper lobe pulmonary nodule.  - - HRCT 07/12/2017 >>>  Scattered minimal cylindrical and varicoid bronchiectasis with associated scattered mucoid impaction and minimal tree-in-bud opacity in both lungs, predominantly in the mid to lower lungs. Findings are stable to slightly worsened since 2015 ches  . Chicken pox   . Chronic cough 05/18/2017   CT chest 03/19/14  No bronchiectasis (not hrct)  Spirometry  05/18/2017  FEV1  2.15 (93%)  Ratio 76 s rx prior  - FENO 05/18/2017  =   Could not perform   - Allergy profile 05/18/2017 >  Eos 0.2 /  IgE  75 RAST pos cat > dog > mold - HRCT 07/12/2017 >>>  See bronchiectasis - trial of dymista 07/26/2017 > some better but not consistent with it > rechallenge rx one puff each am 10/25/2017  - MCT 11/02/17 >    . Colon polyp 03/31/2017   hyperplastic  . Diverticulosis 03/2017   sigmoid  . Essential hypertension 12/21/2010  . Family history of breast cancer   . Family history of colon cancer   . Family history of ovarian cancer   . Frequent UTI   . Genetic testing 03/26/2016   Negative genetic testing on the [facility] 30 gene panel.  The 30-Gene Cancer Panel offered by [facility] includes sequencing and/or deletion duplication testing of the following 30 genes: APC, ATM, BAP1, BARD1, BMPR1A, BRCA1, BRCA2, BRIP1, CDH1, CDK4, CDKN2A (p14ARF), CDKN2A (p16INK4a), CHEK2, EPCAM, GREM1, MITF, MLH1, MSH2, MSH6, MUTYH, NBN, PALB2, PMS2, POLD1, POLE, PTEN, RAD51C, RAD51D, SMAD4,   . GERD 11/09/2006  . H. pylori infection    h/o treated wiht prevpac  . Hormone replacement therapy 01/08/2019  . HYPERLIPIDEMIA 11/09/2006  . Hyperlipidemia 11/09/2006   Qualifier: Diagnosis of  By: Leanne Chang MD, Bruce    . Hypertension   . Hypothyroidism 08/03/2017  . Lumbar radiculopathy 07/24/2014  . Migraine   . Mild depression (  [place]) 02/14/2019  . Pharyngoesophageal dysphagia 08/03/2017  . Piriformis syndrome of right side   . Spinal stenosis, lumbar region, with neurogenic claudication 10/16/2014   Epidural 10/02/2014, December 2016   . Stomach ulcer 2008    Past Surgical History:  Procedure Laterality Date  . CERVICAL POLYPECTOMY  01/17/2019  . LIPOMA EXCISION     of back  . TUBAL LIGATION      Current Medications: Current Meds  Medication Sig  . amLODipine (NORVASC) 10 MG tablet Take 1 tablet (10 mg total) by mouth daily.  Marland Kitchen ascorbic acid (VITAMIN C) 250 MG CHEW Chew 250 mg by mouth  daily.  Marland Kitchen atorvastatin (LIPITOR) 20 MG tablet Take 1 tablet (20 mg total) by mouth daily.  . Azelaic Acid 15 % cream APPLY TO AFFECTED AREA ON FACE UP TO TWO TIMES A DAY AS NEEDED  . Blood Pressure Monitoring (ADULT BLOOD PRESSURE CUFF LG) KIT One adult BP cuff. Monitor blood pressures once a day.  . chlorpheniramine (CHLOR-TRIMETON) 4 MG tablet Take 4 mg by mouth every 4 (four) hours as needed for allergies.  . Ergocalciferol (VITAMIN D2) 2000 units TABS Take 1 tablet by mouth daily.  Marland Kitchen estradiol (VIVELLE-DOT) 0.0375 MG/24HR Place 1 patch onto the skin 2 (two) times a week.  . gabapentin (NEURONTIN) 100 MG capsule TAKE 2-3 CAPSULES (200-300 MG TOTAL) BY MOUTH AT BEDTIME.  Marland Kitchen levothyroxine (SYNTHROID, LEVOTHROID) 88 MCG tablet Take 88 mcg by mouth daily.  Marland Kitchen losartan (COZAAR) 25 MG tablet Take 0.5 tablets (12.5 mg total) by mouth daily.  Marland Kitchen omeprazole (PRILOSEC) 40 MG capsule Take 1 capsule (40 mg total) by mouth daily. Take 30- 60 min before your first meal  . progesterone (PROMETRIUM) 100 MG capsule Take by mouth.  . sucralfate (CARAFATE) 1 GM/10ML suspension Take 10 mLs (1 g total) by mouth in the morning, at noon, in the evening, and at bedtime. Every other day  . venlafaxine XR (EFFEXOR-XR) 37.5 MG 24 hr capsule TAKE 1 CAPSULE BY MOUTH DAILY WITH BREAKFAST.     Allergies:   Tetracycline hcl   Social History   Socioeconomic History  . Marital status: Married    Spouse name: Not on file  . Number of children: Not on file  . Years of education: Not on file  . Highest education level: Not on file  Occupational History  . Not on file  Tobacco Use  . Smoking status: Former Smoker    Packs/day: 2.50    Years: 13.00    Pack years: 32.50    Types: Cigarettes    Quit date: 07/28/1974    Years since quitting: 45.1  . Smokeless tobacco: Never Used  Substance and Sexual Activity  . Alcohol use: Yes    Alcohol/week: 2.0 standard drinks    Types: 2 Cans of beer per week  . Drug use: No  .  Sexual activity: Yes    Partners: Male  Other Topics Concern  . Not on file  Social History Narrative   Married. Two children.    College grad.    Former smoker.    Exercises routinely.    Drink caffeine.    Wears a hearing aid.    Smoke alarm in the home.    Wears her seatbelt.    Feels safe in her relationships.    Social Determinants of Health   Financial Resource Strain:   . Difficulty of Paying Living Expenses:   Food Insecurity:   . Worried About Estate manager/land agent  of Food in the Last Year:   . [place] in the Last Year:   Transportation Needs:   . Lack of Transportation (Medical):   Marland Kitchen Lack of Transportation (Non-Medical):   Physical Activity:   . Days of Exercise per Week:   . Minutes of Exercise per Session:   Stress:   . Feeling of Stress :   Social Connections:   . Frequency of Communication with Friends and Family:   . Frequency of Social Gatherings with Friends and Family:   . Attends Religious Services:   . Active Member of Clubs or Organizations:   . Attends Archivist Meetings:   Marland Kitchen Marital Status:      Family History: The patient's family history includes Breast cancer in her cousin; Cancer in an other family member; Colon cancer in her maternal uncle and paternal aunt; Colon cancer (age of onset: 51) in her father; Colon polyps in her brother; Dementia in her mother; Heart attack in her maternal grandfather; Hyperlipidemia in her brother and mother; Hypertension in her brother; Uterine cancer in her maternal aunt.  ROS:   Review of Systems  Constitution: Negative for decreased appetite, fever and weight gain.  HENT: Negative for congestion, ear discharge, hoarse voice and sore throat.   Eyes: Negative for discharge, redness, vision loss in right eye and visual halos.  Cardiovascular: Negative for chest pain, dyspnea on exertion, leg swelling, orthopnea and palpitations.  Respiratory: Negative for cough, hemoptysis, shortness of breath and  snoring.   Endocrine: Negative for heat intolerance and polyphagia.  Hematologic/Lymphatic: Negative for bleeding problem. Does not bruise/bleed easily.  Skin: Negative for flushing, nail changes, rash and suspicious lesions.  Musculoskeletal: Negative for arthritis, joint pain, muscle cramps, myalgias, neck pain and stiffness.  Gastrointestinal: Negative for abdominal pain, bowel incontinence, diarrhea and excessive appetite.  Genitourinary: Negative for decreased libido, genital sores and incomplete emptying.  Neurological: Negative for brief paralysis, focal weakness, headaches and loss of balance.  Psychiatric/Behavioral: Negative for altered mental status, depression and suicidal ideas.  Allergic/Immunologic: Negative for HIV exposure and persistent infections.    EKGs/Labs/Other Studies Reviewed:    The following studies were reviewed today:   EKG:  The ekg ordered today demonstrates  Zio monitor Conclusion: This study is markable for the following: 08/24/2019                             1.  Nonsustained ventricular tachycardia ( 17 episodes)               2. 489 runs of Supraventricular Tachycardia which is likely atrial tachycardia with variable block.                              3. Symptomatic Frequent Premature atrial complexes (8.6%, [id number]).                             4. Symptomatic Occasional premature ventricular complexes (1.6%, 22319).  TTE IMPRESSIONS 07/23/2019 1. Left ventricular ejection fraction, by estimation, is 60 to 65%. The left ventricle has normal function. The left ventricle has no regional wall motion abnormalities. Left ventricular diastolic parameters are  indeterminate.  2. Right ventricular systolic function is normal. The right ventricular size is normal. There is mildly elevated pulmonary artery systolic  pressure.  3. Left atrial size was mildly  dilated.  4. The mitral valve is normal in structure. Moderate mitral valve regurgitation. No evidence  of mitral stenosis.  5. Tricuspid valve regurgitation is mild to moderate.  6. The aortic valve is normal in structure. Aortic valve regurgitation is mild to moderate. No aortic stenosis is present.  7. The inferior vena cava is normal in size with greater than 50% respiratory variability, suggesting right atrial pressure of 3 mmHg.   Recent Labs: 04/10/2019: BUN 16; Creatinine, Ser 0.82; Hemoglobin 14.2; Platelets 259.0; Potassium 4.1; Sodium 136; TSH 2.89  Recent Lipid Panel    Component Value Date/Time   CHOL 171 08/22/2018 0924   TRIG 97.0 08/22/2018 0924   HDL 71.50 08/22/2018 0924   CHOLHDL 2 08/22/2018 0924   VLDL 19.4 08/22/2018 0924   LDLCALC 80 08/22/2018 0924   LDLDIRECT 158.2 05/07/2013 0928    Physical Exam:    VS:  BP 120/70 (BP Location: Right Arm, Patient Position: Sitting, Cuff Size: Normal)   Pulse 64   Ht 5' 4.5" (1.638 m)   Wt 123 lb (55.8 kg)   SpO2 99% Comment: at rest  BMI 20.79 kg/m     Wt Readings from Last 3 Encounters:  09/04/19 123 lb (55.8 kg)  07/19/19 125 lb (56.7 kg)  07/17/19 122 lb (55.3 kg)     GEN: Well nourished, well developed in no acute distress HEENT: Normal NECK: No JVD; No carotid bruits LYMPHATICS: No lymphadenopathy CARDIAC: S1S2 noted,RRR, no murmurs, rubs, gallops RESPIRATORY:  Clear to auscultation without rales, wheezing or rhonchi  ABDOMEN: Soft, non-tender, non-distended, +bowel sounds, no guarding. EXTREMITIES: No edema, No cyanosis, no clubbing MUSCULOSKELETAL:  No deformity  SKIN: Warm and dry NEUROLOGIC:  Alert and oriented x 3, non-focal PSYCHIATRIC:  Normal affect, good insight  ASSESSMENT:    1. Chest pain, unspecified type   2. Non-sustained ventricular tachycardia ([place])   3. Frequent PAC (premature atrial contraction)   4. Symptomatic PVCs   5. Moderate mitral regurgitation   6. Rheumatic aortic valve insufficiency    PLAN:    She is experiencing chest pain and in the setting of a nonsustained  ventricular tachycardia with her symptoms and risk factors I like to proceed with ischemic evaluation as well in this patient.  I discussed her ischemic evaluation with the patient.  She is agreeable to proceed.  A coronary CTA has been recommended.  She denies any IV contrast dye allergy.  In terms of her nonsustained ventricular tachycardia, frequent PACs and symptomatic PACs-I am going to start the patient on acebutolol 200 mg twice a day hopefully this will help with controlling her symptoms.  I educated patient about this test and she is agreeable to proceed.  For valvular regurgitation (moderate mitral vegetation, mild to moderate aortic regurgitation)-no signs of heart failure we will continue to monitor the patient.  Hypertension-continue patient on her current amlodipine 5 mg daily, losartan 12.5 mg daily..  We will continue to monitor her blood pressure   The patient is in agreement with the above plan. The patient left the office in stable condition.  The patient will follow up in 1 month or sooner if needed.   Medication Adjustments/Labs and Tests Ordered: Current medicines are reviewed at length with the patient today.  Concerns regarding medicines are outlined above.  Orders Placed This Encounter  Procedures  . CT CORONARY MORPH W/CTA COR W/SCORE W/CA W/CM &/OR WO/CM  . Basic metabolic panel   Meds ordered this encounter  Medications  .  acebutolol (SECTRAL) 200 MG capsule    Sig: Take 1 capsule (200 mg total) by mouth 2 (two) times daily.    Dispense:  60 capsule    Refill:  2    Patient Instructions  Medication Instructions:  Your physician has recommended you make the following change in your medication:  START: Acebutolol 200 mg twice daily   *If you need a refill on your cardiac medications before your next appointment, please call your pharmacy*   Lab Work: Your physician recommends that you return for lab work 3-7 days before ct: bmp   If you have labs  (blood work) drawn today and your tests are completely normal, you will receive your results only by: Marland Kitchen MyChart Message (if you have MyChart) OR . A paper copy in the mail If you have any lab test that is abnormal or we need to change your treatment, we will call you to review the results.   Testing/Procedures: Your cardiac CT will be scheduled at one of the below locations:   [facility] [address] [place], [place] [address] [phone]  [facility] [address] [place], [place] [address] [phone]  If scheduled at [facility], please arrive at the [facility] main entrance of [facility] 30 minutes prior to test start time. Proceed to the [facility] Radiology Department (first floor) to check-in and test prep.  If scheduled at [facility], please arrive 15 mins early for check-in and test prep.  Please follow these instructions carefully (unless otherwise directed):    On the Night Before the Test: . Be sure to Drink plenty of water. . Do not consume any caffeinated/decaffeinated beverages or chocolate 12 hours prior to your test. . Do not take any antihistamines 12 hours prior to your test. . If you take Metformin do not take 24 hours prior to test.   On the Day of the Test: . Drink plenty of water. Do not drink any water within one hour of the test. . Do not eat any food 4 hours prior to the test. . You may take your regular medications prior to the test.  . Take acebutolol two hours prior to test. . FEMALES- please wear underwire-free bra if available          After the Test: . Drink plenty of water. . After receiving IV contrast, you may experience a mild flushed feeling. This is normal. . On occasion, you may experience a mild rash up to 24 hours after the test. This is not dangerous. If this occurs, you can take Benadryl 25 mg and increase your  fluid intake. . If you experience trouble breathing, this can be serious. If it is severe call 911 IMMEDIATELY. If it is mild, please call our office. . If you take any of these medications: Glipizide/Metformin, Avandament, Glucavance, please do not take 48 hours after completing test unless otherwise instructed.   Once we have confirmed authorization from your insurance company, we will call you to set up a date and time for your test.   For non-scheduling related questions, please contact the cardiac imaging nurse navigator should you have any questions/concerns: Marchia Bond, Cardiac Imaging Nurse Navigator Burley Saver, Interim Cardiac Imaging Nurse [place] [facility] Direct Office Dial: [phone]   For scheduling needs, including cancellations and rescheduling, please call [phone].      Follow-Up: At [facility], you  and your health needs are our priority.  As part of our continuing mission to provide you with exceptional heart care, we have created designated Provider Care Teams.  These Care Teams include your primary Cardiologist (physician) and Advanced Practice Providers (APPs -  Physician Assistants and Nurse Practitioners) who all work together to provide you with the care you need, when you need it.  We recommend signing up for the patient portal called "MyChart".  Sign up information is provided on this After Visit Summary.  MyChart is used to connect with patients for Virtual Visits (Telemedicine).  Patients are able to view lab/test results, encounter notes, upcoming appointments, etc.  Non-urgent messages can be sent to your provider as well.   To learn more about what you can do with MyChart, go to [email or web address].    Your next appointment:   Follow up as scheduled.   Other Instructions   Cardiac CT Angiogram A cardiac CT angiogram is a procedure to look at the heart and the area around the heart. It may be done to help  find the cause of chest pains or other symptoms of heart disease. During this procedure, a substance called contrast dye is injected into the blood vessels in the area to be checked. A large X-ray machine, called a CT scanner, then takes detailed pictures of the heart and the surrounding area. The procedure is also sometimes called a coronary CT angiogram, coronary artery scanning, or CTA. A cardiac CT angiogram allows the health care provider to see how well blood is flowing to and from the heart. The health care provider will be able to see if there are any problems, such as:  Blockage or narrowing of the coronary arteries in the heart.  Fluid around the heart.  Signs of weakness or disease in the muscles, valves, and tissues of the heart. Tell a health care provider about:  Any allergies you have. This is especially important if you have had a previous allergic reaction to contrast dye.  All medicines you are taking, including vitamins, herbs, eye drops, creams, and over-the-counter medicines.  Any blood disorders you have.  Any surgeries you have had.  Any medical conditions you have.  Whether you are pregnant or may be pregnant.  Any anxiety disorders, chronic pain, or other conditions you have that may increase your stress or prevent you from lying still. What are the risks? Generally, this is a safe procedure. However, problems may occur, including:  Bleeding.  Infection.  Allergic reactions to medicines or dyes.  Damage to other structures or organs.  Kidney damage from the contrast dye that is used.  Increased risk of cancer from radiation exposure. This risk is low. Talk with your health care provider about: ? The risks and benefits of testing. ? How you can receive the lowest dose of radiation. What happens before the procedure?  Wear comfortable clothing and remove any jewelry, glasses, dentures, and hearing aids.  Follow instructions from your health care  provider about eating and drinking. This may include: ? For 12 hours before the procedure -- avoid caffeine. This includes tea, coffee, soda, energy drinks, and diet pills. Drink plenty of water or other fluids that do not have caffeine in them. Being well hydrated can prevent complications. ? For 4-6 hours before the procedure -- stop eating and drinking. The contrast dye can cause nausea, but this is less likely if your stomach is empty.  Ask your health care provider about changing or stopping your  regular medicines. This is especially important if you are taking diabetes medicines, blood thinners, or medicines to treat problems with erections (erectile dysfunction). What happens during the procedure?   Hair on your chest may need to be removed so that small sticky patches called electrodes can be placed on your chest. These will transmit information that helps to monitor your heart during the procedure.  An IV will be inserted into one of your veins.  You might be given a medicine to control your heart rate during the procedure. This will help to ensure that good images are obtained.  You will be asked to lie on an exam table. This table will slide in and out of the CT machine during the procedure.  Contrast dye will be injected into the IV. You might feel warm, or you may get a metallic taste in your mouth.  You will be given a medicine called nitroglycerin. This will relax or dilate the arteries in your heart.  The table that you are lying on will move into the CT machine tunnel for the scan.  The person running the machine will give you instructions while the scans are being done. You may be asked to: ? Keep your arms above your head. ? Hold your breath. ? Stay very still, even if the table is moving.  When the scanning is complete, you will be moved out of the machine.  The IV will be removed. The procedure may vary among health care providers and hospitals. What can I expect  after the procedure? After your procedure, it is common to have:  A metallic taste in your mouth from the contrast dye.  A feeling of warmth.  A headache from the nitroglycerin. Follow these instructions at home:  Take over-the-counter and prescription medicines only as told by your health care provider.  If you are told, drink enough fluid to keep your urine pale yellow. This will help to flush the contrast dye out of your body.  Most people can return to their normal activities right after the procedure. Ask your health care provider what activities are safe for you.  It is up to you to get the results of your procedure. Ask your health care provider, or the department that is doing the procedure, when your results will be ready.  Keep all follow-up visits as told by your health care provider. This is important. Contact a health care provider if:  You have any symptoms of allergy to the contrast dye. These include: ? Shortness of breath. ? Rash or hives. ? A racing heartbeat. Summary  A cardiac CT angiogram is a procedure to look at the heart and the area around the heart. It may be done to help find the cause of chest pains or other symptoms of heart disease.  During this procedure, a large X-ray machine, called a CT scanner, takes detailed pictures of the heart and the surrounding area after a contrast dye has been injected into blood vessels in the area.  Ask your health care provider about changing or stopping your regular medicines before the procedure. This is especially important if you are taking diabetes medicines, blood thinners, or medicines to treat erectile dysfunction.  If you are told, drink enough fluid to keep your urine pale yellow. This will help to flush the contrast dye out of your body. This information is not intended to replace advice given to you by your health care provider. Make sure you discuss any questions you have  with your health care  provider. Document Revised: 11/08/2018 Document Reviewed: 11/08/2018 Elsevier Patient Education  [place].  Acebutolol Capsules What is this medicine? ACEBUTOLOL (a se BYOO toe lole) is a beta blocker. It decreases the amount of work your heart has to do and helps your heart beat regularly. It is used to treat high blood pressure. This medicine may be used for other purposes; ask your health care provider or pharmacist if you have questions. COMMON BRAND NAME(S): Sectral What should I tell my health care provider before I take this medicine? They need to know if you have any of these conditions:  diabetes  heart or vessel disease like slow heartrate, worsening heart failure, heart block, sick sinus syndrome or Raynaud's disease  kidney disease  liver disease  lung or breathing disease, like asthma or emphysema  pheochromocytoma  thyroid disease  an unusual or allergic reaction to acebutolol, other beta-blockers, medicines, foods, dyes, or preservatives  pregnant or trying to get pregnant  breast-feeding How should I use this medicine? Take this drug by mouth. Take it as directed on the prescription label at the same time every day. You can take it with or without food. If it upsets your stomach, take it with food. Keep taking it unless your health care provider tells you to stop. Talk to your health care provider about the use of this drug in children. Special care may be needed. Overdosage: If you think you have taken too much of this medicine contact a poison control center or emergency room at once. NOTE: This medicine is only for you. Do not share this medicine with others. What if I miss a dose? If you miss a dose, take it as soon as you can. If it is almost time for your next dose, take only that dose. Do not take double or extra doses. What may interact with this medicine? This medicine may interact with the following medications:  certain medicines for blood  pressure, heart disease, irregular heart beat  NSAIDS, medicines for pain and inflammation, like ibuprofen or naproxen This list may not describe all possible interactions. Give your health care provider a list of all the medicines, herbs, non-prescription drugs, or dietary supplements you use. Also tell them if you smoke, drink alcohol, or use illegal drugs. Some items may interact with your medicine. What should I watch for while using this medicine? Visit your doctor or health care professional for regular checks on your progress. Check your heart rate and blood pressure regularly while you are taking this medicine. Ask your doctor or health care professional what your heart rate and blood pressure should be, and when you should contact him or her. You may get drowsy or dizzy. Do not drive, use machinery, or do anything that needs mental alertness until you know how this drug affects you. Do not stand or sit up quickly, especially if you are an older patient. This reduces the risk of dizzy or fainting spells. Alcohol can make you more drowsy and dizzy. Avoid alcoholic drinks. This medicine may increase blood sugar. Ask your healthcare provider if changes in diet or medicines are needed if you have diabetes. Do not treat yourself for coughs, colds, or pain while you are taking this medicine without asking your doctor or health care professional for advice. Some ingredients may increase your blood pressure. What side effects may I notice from receiving this medicine? Side effects that you should report to your doctor or health care professional  as soon as possible:  allergic reactions like skin rash, itching or hives, swelling of the face, lips, or tongue  breathing problems  chest pain  cold, tingling, or numb hands or feet  confusion  irregular heartbeat  muscle aches and pains   signs and symptoms of high blood sugar such as being more thirsty or hungry or having to urinate more than  normal. You may also feel very tired or have blurry vision.  slow heart rate  sweating  swollen legs or ankles  tremor, shakes  vomiting Side effects that usually do not require medical attention (report to your doctor or health care professional if they continue or are bothersome):  anxiety  change in sex drive or performance  depression  diarrhea  dry or burning eyes  headache  nausea This list may not describe all possible side effects. Call your doctor for medical advice about side effects. You may report side effects to FDA at 1-800-FDA-1088. Where should I keep my medicine? Keep out of the reach of children and pets. Store at room temperature between 20 and 25 degrees C (68 and 77 degrees F). Protect from light. Throw away any unused drug after the expiration date. NOTE: This sheet is a summary. It may not cover all possible information. If you have questions about this medicine, talk to your doctor, pharmacist, or health care provider.  2020 Elsevier/Gold Standard (2018-10-26 14:41:07)      Adopting a Healthy Lifestyle.  Know what a healthy weight is for you (roughly BMI <25) and aim to maintain this   Aim for 7+ servings of fruits and vegetables daily   65-80+ fluid ounces of water or unsweet tea for healthy kidneys   Limit to max 1 drink of alcohol per day; avoid smoking/tobacco   Limit animal fats in diet for cholesterol and heart health - choose grass fed whenever available   Avoid highly processed foods, and foods high in saturated/trans fats   Aim for low stress - take time to unwind and care for your mental health   Aim for 150 min of moderate intensity exercise weekly for heart health, and weights twice weekly for bone health   Aim for 7-9 hours of sleep daily   When it comes to diets, agreement about the perfect plan isnt easy to find, even among the experts. Experts at the [employer] developed an idea known as the  Healthy Eating Plate. Just imagine a plate divided into logical, healthy portions.   The emphasis is on diet quality:   Load up on vegetables and fruits - one-half of your plate: Aim for color and variety, and remember that potatoes dont count.   Go for whole grains - one-quarter of your plate: Whole wheat, barley, wheat berries, quinoa, oats, brown rice, and foods made with them. If you want pasta, go with whole wheat pasta.   Protein power - one-quarter of your plate: Fish, chicken, beans, and nuts are all healthy, versatile protein sources. Limit red meat.   The diet, however, does go beyond the plate, offering a few other suggestions.   Use healthy plant oils, such as olive, canola, soy, corn, sunflower and peanut. Check the labels, and avoid partially hydrogenated oil, which have unhealthy trans fats.   If youre thirsty, drink water. Coffee and tea are good in moderation, but skip sugary drinks and limit milk and dairy products to one or two daily servings.   The type of carbohydrate in the  diet is more important than the amount. Some sources of carbohydrates, such as vegetables, fruits, whole grains, and beans-are healthier than others.   Finally, stay active  Signed, Berniece Salines, DO  09/04/2019 9:12 PM    [facility]

## 2019-09-04 NOTE — Patient Instructions (Addendum)
Medication Instructions:  Your physician has recommended you make the following change in your medication:  START: Acebutolol 200 mg twice daily   *If you need a refill on your cardiac medications before your next appointment, please call your pharmacy*   Lab Work: Your physician recommends that you return for lab work 3-7 days before ct: bmp   If you have labs (blood work) drawn today and your tests are completely normal, you will receive your results only by: Marland Kitchen MyChart Message (if you have MyChart) OR . A paper copy in the mail If you have any lab test that is abnormal or we need to change your treatment, we will call you to review the results.   Testing/Procedures: Your cardiac CT will be scheduled at one of the below locations:   [facility] [address] [place], [place] [address] [phone]  [place] [address] [place], [place] [address] [phone]  If scheduled at [facility], please arrive at the [facility] main entrance of [facility] 30 minutes prior to test start time. Proceed to the [facility] Radiology Department (first floor) to check-in and test prep.  If scheduled at [facility], please arrive 15 mins early for check-in and test prep.  Please follow these instructions carefully (unless otherwise directed):    On the Night Before the Test: . Be sure to Drink plenty of water. . Do not consume any caffeinated/decaffeinated beverages or chocolate 12 hours prior to your test. . Do not take any antihistamines 12 hours prior to your test. . If you take Metformin do not take 24 hours prior to test.   On the Day of the Test: . Drink plenty of water. Do not drink any water within one hour of the test. . Do not eat any food 4 hours prior to the test. . You may take your regular medications prior to the test.  . Take acebutolol two hours prior  to test. . FEMALES- please wear underwire-free bra if available          After the Test: . Drink plenty of water. . After receiving IV contrast, you may experience a mild flushed feeling. This is normal. . On occasion, you may experience a mild rash up to 24 hours after the test. This is not dangerous. If this occurs, you can take Benadryl 25 mg and increase your fluid intake. . If you experience trouble breathing, this can be serious. If it is severe call 911 IMMEDIATELY. If it is mild, please call our office. . If you take any of these medications: Glipizide/Metformin, Avandament, Glucavance, please do not take 48 hours after completing test unless otherwise instructed.   Once we have confirmed authorization from your insurance company, we will call you to set up a date and time for your test.   For non-scheduling related questions, please contact the cardiac imaging nurse navigator should you have any questions/concerns: Marchia Bond, Cardiac Imaging Nurse Navigator Burley Saver, Interim Cardiac Imaging Nurse [facility] Direct Office Dial: [phone]   For scheduling needs, including cancellations and rescheduling, please call [phone].      Follow-Up: At [facility], you and your health needs are our priority.  As part of our continuing mission to provide you with exceptional heart care, we have created designated Provider Care Teams.  These Care Teams include your primary Cardiologist (physician) and Advanced Practice Providers (  APPs -  Physician Assistants and Nurse Practitioners) who all work together to provide you with the care you need, when you need it.  We recommend signing up for the patient portal called "MyChart".  Sign up information is provided on this After Visit Summary.  MyChart is used to connect with patients for Virtual Visits (Telemedicine).  Patients are able to view lab/test results, encounter notes, upcoming  appointments, etc.  Non-urgent messages can be sent to your provider as well.   To learn more about what you can do with MyChart, go to [email or web address].    Your next appointment:   Follow up as scheduled.   Other Instructions   Cardiac CT Angiogram A cardiac CT angiogram is a procedure to look at the heart and the area around the heart. It may be done to help find the cause of chest pains or other symptoms of heart disease. During this procedure, a substance called contrast dye is injected into the blood vessels in the area to be checked. A large X-ray machine, called a CT scanner, then takes detailed pictures of the heart and the surrounding area. The procedure is also sometimes called a coronary CT angiogram, coronary artery scanning, or CTA. A cardiac CT angiogram allows the health care provider to see how well blood is flowing to and from the heart. The health care provider will be able to see if there are any problems, such as:  Blockage or narrowing of the coronary arteries in the heart.  Fluid around the heart.  Signs of weakness or disease in the muscles, valves, and tissues of the heart. Tell a health care provider about:  Any allergies you have. This is especially important if you have had a previous allergic reaction to contrast dye.  All medicines you are taking, including vitamins, herbs, eye drops, creams, and over-the-counter medicines.  Any blood disorders you have.  Any surgeries you have had.  Any medical conditions you have.  Whether you are pregnant or may be pregnant.  Any anxiety disorders, chronic pain, or other conditions you have that may increase your stress or prevent you from lying still. What are the risks? Generally, this is a safe procedure. However, problems may occur, including:  Bleeding.  Infection.  Allergic reactions to medicines or dyes.  Damage to other structures or organs.  Kidney damage from the contrast dye that is  used.  Increased risk of cancer from radiation exposure. This risk is low. Talk with your health care provider about: ? The risks and benefits of testing. ? How you can receive the lowest dose of radiation. What happens before the procedure?  Wear comfortable clothing and remove any jewelry, glasses, dentures, and hearing aids.  Follow instructions from your health care provider about eating and drinking. This may include: ? For 12 hours before the procedure -- avoid caffeine. This includes tea, coffee, soda, energy drinks, and diet pills. Drink plenty of water or other fluids that do not have caffeine in them. Being well hydrated can prevent complications. ? For 4-6 hours before the procedure -- stop eating and drinking. The contrast dye can cause nausea, but this is less likely if your stomach is empty.  Ask your health care provider about changing or stopping your regular medicines. This is especially important if you are taking diabetes medicines, blood thinners, or medicines to treat problems with erections (erectile dysfunction). What happens during the procedure?   Hair on your chest may need to be removed so that  small sticky patches called electrodes can be placed on your chest. These will transmit information that helps to monitor your heart during the procedure.  An IV will be inserted into one of your veins.  You might be given a medicine to control your heart rate during the procedure. This will help to ensure that good images are obtained.  You will be asked to lie on an exam table. This table will slide in and out of the CT machine during the procedure.  Contrast dye will be injected into the IV. You might feel warm, or you may get a metallic taste in your mouth.  You will be given a medicine called nitroglycerin. This will relax or dilate the arteries in your heart.  The table that you are lying on will move into the CT machine tunnel for the scan.  The person running  the machine will give you instructions while the scans are being done. You may be asked to: ? Keep your arms above your head. ? Hold your breath. ? Stay very still, even if the table is moving.  When the scanning is complete, you will be moved out of the machine.  The IV will be removed. The procedure may vary among health care providers and hospitals. What can I expect after the procedure? After your procedure, it is common to have:  A metallic taste in your mouth from the contrast dye.  A feeling of warmth.  A headache from the nitroglycerin. Follow these instructions at home:  Take over-the-counter and prescription medicines only as told by your health care provider.  If you are told, drink enough fluid to keep your urine pale yellow. This will help to flush the contrast dye out of your body.  Most people can return to their normal activities right after the procedure. Ask your health care provider what activities are safe for you.  It is up to you to get the results of your procedure. Ask your health care provider, or the department that is doing the procedure, when your results will be ready.  Keep all follow-up visits as told by your health care provider. This is important. Contact a health care provider if:  You have any symptoms of allergy to the contrast dye. These include: ? Shortness of breath. ? Rash or hives. ? A racing heartbeat. Summary  A cardiac CT angiogram is a procedure to look at the heart and the area around the heart. It may be done to help find the cause of chest pains or other symptoms of heart disease.  During this procedure, a large X-ray machine, called a CT scanner, takes detailed pictures of the heart and the surrounding area after a contrast dye has been injected into blood vessels in the area.  Ask your health care provider about changing or stopping your regular medicines before the procedure. This is especially important if you are taking  diabetes medicines, blood thinners, or medicines to treat erectile dysfunction.  If you are told, drink enough fluid to keep your urine pale yellow. This will help to flush the contrast dye out of your body. This information is not intended to replace advice given to you by your health care provider. Make sure you discuss any questions you have with your health care provider. Document Revised: 11/08/2018 Document Reviewed: 11/08/2018 Elsevier Patient Education  [employer].  Acebutolol Capsules What is this medicine? ACEBUTOLOL (a se BYOO toe lole) is a beta blocker. It decreases the amount of  work your heart has to do and helps your heart beat regularly. It is used to treat high blood pressure. This medicine may be used for other purposes; ask your health care provider or pharmacist if you have questions. COMMON BRAND NAME(S): Sectral What should I tell my health care provider before I take this medicine? They need to know if you have any of these conditions:  diabetes  heart or vessel disease like slow heartrate, worsening heart failure, heart block, sick sinus syndrome or Raynaud's disease  kidney disease  liver disease  lung or breathing disease, like asthma or emphysema  pheochromocytoma  thyroid disease  an unusual or allergic reaction to acebutolol, other beta-blockers, medicines, foods, dyes, or preservatives  pregnant or trying to get pregnant  breast-feeding How should I use this medicine? Take this drug by mouth. Take it as directed on the prescription label at the same time every day. You can take it with or without food. If it upsets your stomach, take it with food. Keep taking it unless your health care provider tells you to stop. Talk to your health care provider about the use of this drug in children. Special care may be needed. Overdosage: If you think you have taken too much of this medicine contact a poison control center or emergency room at  once. NOTE: This medicine is only for you. Do not share this medicine with others. What if I miss a dose? If you miss a dose, take it as soon as you can. If it is almost time for your next dose, take only that dose. Do not take double or extra doses. What may interact with this medicine? This medicine may interact with the following medications:  certain medicines for blood pressure, heart disease, irregular heart beat  NSAIDS, medicines for pain and inflammation, like ibuprofen or naproxen This list may not describe all possible interactions. Give your health care provider a list of all the medicines, herbs, non-prescription drugs, or dietary supplements you use. Also tell them if you smoke, drink alcohol, or use illegal drugs. Some items may interact with your medicine. What should I watch for while using this medicine? Visit your doctor or health care professional for regular checks on your progress. Check your heart rate and blood pressure regularly while you are taking this medicine. Ask your doctor or health care professional what your heart rate and blood pressure should be, and when you should contact him or her. You may get drowsy or dizzy. Do not drive, use machinery, or do anything that needs mental alertness until you know how this drug affects you. Do not stand or sit up quickly, especially if you are an older patient. This reduces the risk of dizzy or fainting spells. Alcohol can make you more drowsy and dizzy. Avoid alcoholic drinks. This medicine may increase blood sugar. Ask your healthcare provider if changes in diet or medicines are needed if you have diabetes. Do not treat yourself for coughs, colds, or pain while you are taking this medicine without asking your doctor or health care professional for advice. Some ingredients may increase your blood pressure. What side effects may I notice from receiving this medicine? Side effects that you should report to your doctor or health  care professional as soon as possible:  allergic reactions like skin rash, itching or hives, swelling of the face, lips, or tongue  breathing problems  chest pain  cold, tingling, or numb hands or feet  confusion  irregular heartbeat  muscle aches and pains   signs and symptoms of high blood sugar such as being more thirsty or hungry or having to urinate more than normal. You may also feel very tired or have blurry vision.  slow heart rate  sweating  swollen legs or ankles  tremor, shakes  vomiting Side effects that usually do not require medical attention (report to your doctor or health care professional if they continue or are bothersome):  anxiety  change in sex drive or performance  depression  diarrhea  dry or burning eyes  headache  nausea This list may not describe all possible side effects. Call your doctor for medical advice about side effects. You may report side effects to FDA at 1-800-FDA-1088. Where should I keep my medicine? Keep out of the reach of children and pets. Store at room temperature between 20 and 25 degrees C (68 and 77 degrees F). Protect from light. Throw away any unused drug after the expiration date. NOTE: This sheet is a summary. It may not cover all possible information. If you have questions about this medicine, talk to your doctor, pharmacist, or health care provider.  2020 Elsevier/Gold Standard (2018-10-26 14:41:07)

## 2019-09-20 DIAGNOSIS — R079 Chest pain, unspecified: Secondary | ICD-10-CM | POA: Diagnosis not present

## 2019-09-20 DIAGNOSIS — I472 Ventricular tachycardia: Secondary | ICD-10-CM | POA: Diagnosis not present

## 2019-09-21 ENCOUNTER — Telehealth: Payer: Self-pay

## 2019-09-21 LAB — BASIC METABOLIC PANEL
BUN/Creatinine Ratio: 16 (ref 12–28)
BUN: 16 mg/dL (ref 8–27)
CO2: 26 mmol/L (ref 20–29)
Calcium: 9.6 mg/dL (ref 8.7–10.3)
Chloride: 103 mmol/L (ref 96–106)
Creatinine, Ser: 0.99 mg/dL (ref 0.57–1.00)
GFR calc Af Amer: 66 mL/min/{1.73_m2} (ref >59)
GFR calc non Af Amer: 57 mL/min/{1.73_m2} — ABNORMAL LOW (ref >59)
Glucose: 85 mg/dL (ref 65–99)
Potassium: 4.7 mmol/L (ref 3.5–5.2)
Sodium: 141 mmol/L (ref 134–144)

## 2019-09-21 NOTE — Telephone Encounter (Signed)
Spoke with patients husband regarding results and recommendation.  He verbalizes understanding and is agreeable to plan of care. Advised patient to call back with any issues or concerns.  

## 2019-09-21 NOTE — Telephone Encounter (Signed)
-----   Message from Loel Dubonnet, NP sent at 09/21/2019 11:13 AM EDT ----- Kidney function overall stable. Slight decrease in GFR (filtration rate) since last collection in January. Recommend she stay well hydrated. Proceed with cardiac CT as scheduled.

## 2019-09-26 ENCOUNTER — Telehealth (HOSPITAL_COMMUNITY): Payer: Self-pay | Admitting: *Deleted

## 2019-09-26 NOTE — Telephone Encounter (Signed)
Attempted to call patient regarding upcoming cardiac CT appointment. Left message on voicemail with name and callback number  Zack Crager Tai RN Navigator Cardiac Imaging  Heart and Vascular Services [phone] Office [phone] Cell  

## 2019-09-26 NOTE — Telephone Encounter (Signed)
Patient returning call regarding upcoming cardiac imaging study; pt verbalizes understanding of appt date/time, parking situation and where to check in, pre-test NPO status and medications ordered, and verified current allergies; name and call back number provided for further questions should they arise ° °Dylan Monforte Tai RN Navigator Cardiac Imaging °[facility] °[phone] office °[phone] cell ° °

## 2019-09-27 ENCOUNTER — Other Ambulatory Visit: Payer: Self-pay

## 2019-09-27 ENCOUNTER — Ambulatory Visit (HOSPITAL_COMMUNITY)
Admission: RE | Admit: 2019-09-27 | Discharge: 2019-09-27 | Disposition: A | Discharge status: Home / Self Care | Payer: Medicare PPO | Source: Ambulatory Visit | Attending: Cardiology | Admitting: Cardiology

## 2019-09-27 DIAGNOSIS — R079 Chest pain, unspecified: Secondary | ICD-10-CM | POA: Insufficient documentation

## 2019-09-27 DIAGNOSIS — J90 Pleural effusion, not elsewhere classified: Secondary | ICD-10-CM | POA: Insufficient documentation

## 2019-09-27 DIAGNOSIS — I2510 Atherosclerotic heart disease of native coronary artery without angina pectoris: Secondary | ICD-10-CM | POA: Diagnosis not present

## 2019-09-27 MED ORDER — NITROGLYCERIN 0.4 MG SL SUBL
0.8000 mg | SUBLINGUAL_TABLET | Freq: Once | SUBLINGUAL | Status: AC
  Administered 2019-09-27: 0.8 mg via SUBLINGUAL

## 2019-09-27 MED ORDER — IOHEXOL 350 MG/ML SOLN
80.0000 mL | Freq: Once | INTRAVENOUS | Status: AC | PRN
  Administered 2019-09-27: 80 mL via INTRAVENOUS

## 2019-09-27 MED ORDER — NITROGLYCERIN 0.4 MG SL SUBL
SUBLINGUAL_TABLET | SUBLINGUAL | Status: AC
  Filled 2019-09-27: qty 2

## 2019-09-28 ENCOUNTER — Telehealth: Payer: Self-pay

## 2019-09-28 MED ORDER — ATORVASTATIN CALCIUM 40 MG PO TABS
40.0000 mg | ORAL_TABLET | Freq: Every day | ORAL | 3 refills | Status: DC
Start: 2019-09-28 — End: 2019-10-17

## 2019-09-28 NOTE — Telephone Encounter (Signed)
Spoke with patient regarding results and recommendation.  Patient verbalizes understanding and is agreeable to plan of care. Advised patient to call back with any issues or concerns.  

## 2019-09-28 NOTE — Telephone Encounter (Signed)
Left message on patients voicemail to please return our call.   

## 2019-09-28 NOTE — Telephone Encounter (Signed)
Follow up  ° ° °Pt returning call  °

## 2019-09-28 NOTE — Telephone Encounter (Signed)
-----   Message from Berniece Salines, DO sent at 09/28/2019  8:18 AM EDT ----- Your CT scan showed evidence of coronary artery disease-this may for blockages in the arteries cardiac catheterization.  We will give medications.  Already on Lipitor 20 mg daily increased to 40 mg daily.  If there are no allergies or significant bleeding history I like to start you on aspirin 81 mg daily. Discuss more detail at your next visit

## 2019-10-03 ENCOUNTER — Other Ambulatory Visit: Payer: Self-pay

## 2019-10-03 DIAGNOSIS — R079 Chest pain, unspecified: Secondary | ICD-10-CM

## 2019-10-04 ENCOUNTER — Other Ambulatory Visit: Payer: Self-pay

## 2019-10-04 ENCOUNTER — Other Ambulatory Visit (HOSPITAL_COMMUNITY): Payer: Self-pay | Admitting: Cardiology

## 2019-10-04 ENCOUNTER — Ambulatory Visit (HOSPITAL_COMMUNITY)
Admission: RE | Admit: 2019-10-04 | Discharge: 2019-10-04 | Disposition: A | Discharge status: Home / Self Care | Payer: Medicare PPO | Source: Ambulatory Visit | Attending: Cardiology | Admitting: Cardiology

## 2019-10-04 DIAGNOSIS — R072 Precordial pain: Secondary | ICD-10-CM | POA: Diagnosis not present

## 2019-10-04 DIAGNOSIS — R079 Chest pain, unspecified: Secondary | ICD-10-CM

## 2019-10-11 DIAGNOSIS — R9430 Abnormal result of cardiovascular function study, unspecified: Secondary | ICD-10-CM

## 2019-10-17 ENCOUNTER — Other Ambulatory Visit: Payer: Self-pay

## 2019-10-17 ENCOUNTER — Encounter: Payer: Self-pay | Admitting: Cardiology

## 2019-10-17 ENCOUNTER — Ambulatory Visit: Payer: Medicare PPO | Admitting: Cardiology

## 2019-10-17 VITALS — BP 100/70 | HR 60 | Ht 64.5 in | Wt 123.0 lb

## 2019-10-17 DIAGNOSIS — I700 Atherosclerosis of aorta: Secondary | ICD-10-CM | POA: Diagnosis not present

## 2019-10-17 DIAGNOSIS — I10 Essential (primary) hypertension: Secondary | ICD-10-CM | POA: Diagnosis not present

## 2019-10-17 DIAGNOSIS — I4729 Other ventricular tachycardia: Secondary | ICD-10-CM

## 2019-10-17 DIAGNOSIS — I491 Atrial premature depolarization: Secondary | ICD-10-CM | POA: Diagnosis not present

## 2019-10-17 DIAGNOSIS — I472 Ventricular tachycardia: Secondary | ICD-10-CM | POA: Diagnosis not present

## 2019-10-17 DIAGNOSIS — I351 Nonrheumatic aortic (valve) insufficiency: Secondary | ICD-10-CM

## 2019-10-17 DIAGNOSIS — I340 Nonrheumatic mitral (valve) insufficiency: Secondary | ICD-10-CM | POA: Diagnosis not present

## 2019-10-17 DIAGNOSIS — E782 Mixed hyperlipidemia: Secondary | ICD-10-CM

## 2019-10-17 MED ORDER — ATORVASTATIN CALCIUM 40 MG PO TABS: 40.0000 mg | ORAL_TABLET | Freq: Every day | ORAL | 3 refills | Status: DC

## 2019-10-17 NOTE — Progress Notes (Signed)
Cardiology Office Note:    Date:  10/17/2019   ID:  Yvette Short, DOB [birth year], MRN [id number]  PCP:  Ma Hillock, DO  Cardiologist:  Berniece Salines, DO  Electrophysiologist:  None   Referring MD: Ma Hillock, DO   Chief Complaint  Patient presents with  . Follow-up  " I have been tired and my blood pressure has been low"  History of Present Illness:    Yvette Short is a 74 y.o. female with a hx of hypertension, hyperlipidemia, hypothyroidism, moderate coronary artery disease by coronary CTA, frequent PACs and PVCs, moderate mitral regurgitation, moderate aortic regurgitation presents today for follow-up visit.  Last saw the patient on September 04, 2019 at that time we discussed her monitor results which showed frequent PACs as well as PVCs.  I did start the patient on acebutolol 200 mg twice daily.  That day she also reported that she was experiencing chest pain we ordered coronary CTA which she was able to get in the interim showed moderate coronary artery disease.  Today patient comes for follow-up visit she tells me that she has been experiencing fatigue but feels a little better than she felt in the past.  She had a few questions she wanted to discuss with me.  No other concerns at this time.  Past Medical History:  Diagnosis Date  . Abnormal colonoscopy 03/2017   decreased rectal tone and polyp; 5 year recall  . Abnormal findings on esophagogastroduodenoscopy (EGD) 02/25/2016   Z-line irrg. at 40 cm. No abnomrality of the esophagus to explain dysphagia. Esophagus dilated. H/o + H. Pylori  . Allergy   . ANXIETY 07/06/2007  . Aortic atherosclerosis ([place]) 02/08/2018  . Atherosclerosis of native coronary artery of native heart without angina pectoris 02/08/2018  . Biceps tendonitis on right   . Bronchiectasis without complication ([place]) 91/69/4503   CT 2012 IMPRESSION:  1. Apical scarring may account for the plain film abnormality.  2. A 6 mm right upper lobe  pulmonary nodule.  - - HRCT 07/12/2017 >>>  Scattered minimal cylindrical and varicoid bronchiectasis with associated scattered mucoid impaction and minimal tree-in-bud opacity in both lungs, predominantly in the mid to lower lungs. Findings are stable to slightly worsened since 2015 ches  . Chicken pox   . Chronic cough 05/18/2017   CT chest 03/19/14  No bronchiectasis (not hrct)  Spirometry 05/18/2017  FEV1  2.15 (93%)  Ratio 76 s rx prior  - FENO 05/18/2017  =   Could not perform   - Allergy profile 05/18/2017 >  Eos 0.2 /  IgE  75 RAST pos cat > dog > mold - HRCT 07/12/2017 >>>  See bronchiectasis - trial of dymista 07/26/2017 > some better but not consistent with it > rechallenge rx one puff each am 10/25/2017  - MCT 11/02/17 >    . Colon polyp 03/31/2017   hyperplastic  . Diverticulosis 03/2017   sigmoid  . Essential hypertension 12/21/2010  . Family history of breast cancer   . Family history of colon cancer   . Family history of ovarian cancer   . Frequent UTI   . Genetic testing 03/26/2016   Negative genetic testing on the [facility] 30 gene panel.  The 30-Gene Cancer Panel offered by [facility] includes sequencing and/or deletion duplication testing of the following 30 genes: APC, ATM, BAP1, BARD1, BMPR1A, BRCA1, BRCA2, BRIP1, CDH1, CDK4, CDKN2A (p14ARF), CDKN2A (p16INK4a), CHEK2, EPCAM, GREM1, MITF, MLH1, MSH2, MSH6, MUTYH, NBN, PALB2, PMS2,  POLD1, POLE, PTEN, RAD51C, RAD51D, SMAD4,   . GERD 11/09/2006  . H. pylori infection    h/o treated wiht prevpac  . Hormone replacement therapy 01/08/2019  . HYPERLIPIDEMIA 11/09/2006  . Hyperlipidemia 11/09/2006   Qualifier: Diagnosis of  By: Swords MD, Bruce    . Hypertension   . Hypothyroidism 08/03/2017  . Lumbar radiculopathy 07/24/2014  . Migraine   . Mild depression (HCC) 02/14/2019  . Pharyngoesophageal dysphagia 08/03/2017  . Piriformis syndrome of right side   . Spinal stenosis, lumbar region, with neurogenic claudication 10/16/2014   Epidural  10/02/2014, December 2016   . Stomach ulcer 2008    Past Surgical History:  Procedure Laterality Date  . CERVICAL POLYPECTOMY  01/17/2019  . LIPOMA EXCISION     of back  . TUBAL LIGATION      Current Medications: Current Meds  Medication Sig  . acebutolol (SECTRAL) 200 MG capsule Take 1 capsule (200 mg total) by mouth 2 (two) times daily.  . ascorbic acid (VITAMIN C) 250 MG CHEW Chew 250 mg by mouth daily.  . aspirin 81 MG chewable tablet Chew 81 mg by mouth daily.  . atorvastatin (LIPITOR) 40 MG tablet Take 1 tablet (40 mg total) by mouth daily.  . Azelaic Acid 15 % cream APPLY TO AFFECTED AREA ON FACE UP TO TWO TIMES A DAY AS NEEDED  . Blood Pressure Monitoring (ADULT BLOOD PRESSURE CUFF LG) KIT One adult BP cuff. Monitor blood pressures once a day.  . chlorpheniramine (CHLOR-TRIMETON) 4 MG tablet Take 4 mg by mouth every 4 (four) hours as needed for allergies.  . Ergocalciferol (VITAMIN D2) 2000 units TABS Take 1 tablet by mouth daily.  . estradiol (VIVELLE-DOT) 0.0375 MG/24HR Place 1 patch onto the skin 2 (two) times a week.  . gabapentin (NEURONTIN) 100 MG capsule TAKE 2-3 CAPSULES (200-300 MG TOTAL) BY MOUTH AT BEDTIME.  . levothyroxine (SYNTHROID, LEVOTHROID) 88 MCG tablet Take 88 mcg by mouth daily.  . losartan (COZAAR) 25 MG tablet Take 0.5 tablets (12.5 mg total) by mouth daily.  . omeprazole (PRILOSEC) 40 MG capsule Take 1 capsule (40 mg total) by mouth daily. Take 30- 60 min before your first meal  . progesterone (PROMETRIUM) 100 MG capsule Take by mouth.  . sucralfate (CARAFATE) 1 GM/10ML suspension Take 10 mLs (1 g total) by mouth in the morning, at noon, in the evening, and at bedtime. Every other day  . venlafaxine XR (EFFEXOR-XR) 37.5 MG 24 hr capsule TAKE 1 CAPSULE BY MOUTH DAILY WITH BREAKFAST.  . [DISCONTINUED] amLODipine (NORVASC) 10 MG tablet Take 1 tablet (10 mg total) by mouth daily.     Allergies:   Tetracycline hcl   Social History   Socioeconomic  History  . Marital status: Married    Spouse name: Not on file  . Number of children: Not on file  . Years of education: Not on file  . Highest education level: Not on file  Occupational History  . Not on file  Tobacco Use  . Smoking status: Former Smoker    Packs/day: 2.50    Years: 13.00    Pack years: 32.50    Types: Cigarettes    Quit date: 07/28/1974    Years since quitting: 45.2  . Smokeless tobacco: Never Used  Vaping Use  . Vaping Use: Never used  Substance and Sexual Activity  . Alcohol use: Yes    Alcohol/week: 2.0 standard drinks    Types: 2 Cans of beer per week  .   Drug use: No  . Sexual activity: Yes    Partners: Male  Other Topics Concern  . Not on file  Social History Narrative   Married. Two children.    College grad.    Former smoker.    Exercises routinely.    Drink caffeine.    Wears a hearing aid.    Smoke alarm in the home.    Wears her seatbelt.    Feels safe in her relationships.    Social Determinants of Health   Financial Resource Strain:   . Difficulty of Paying Living Expenses:   Food Insecurity:   . Worried About Running Out of Food in the Last Year:   . Ran Out of Food in the Last Year:   Transportation Needs:   . Lack of Transportation (Medical):   . Lack of Transportation (Non-Medical):   Physical Activity:   . Days of Exercise per Week:   . Minutes of Exercise per Session:   Stress:   . Feeling of Stress :   Social Connections:   . Frequency of Communication with Friends and Family:   . Frequency of Social Gatherings with Friends and Family:   . Attends Religious Services:   . Active Member of Clubs or Organizations:   . Attends Club or Organization Meetings:   . Marital Status:      Family History: The patient's family history includes Breast cancer in her cousin; Cancer in an other family member; Colon cancer in her maternal uncle and paternal aunt; Colon cancer (age of onset: 58) in her father; Colon polyps in her  brother; Dementia in her mother; Heart attack in her maternal grandfather; Hyperlipidemia in her brother and mother; Hypertension in her brother; Uterine cancer in her maternal aunt.  ROS:   Review of Systems  Constitution: Negative for decreased appetite, fever and weight gain.  HENT: Negative for congestion, ear discharge, hoarse voice and sore throat.   Eyes: Negative for discharge, redness, vision loss in right eye and visual halos.  Cardiovascular: Negative for chest pain, dyspnea on exertion, leg swelling, orthopnea and palpitations.  Respiratory: Negative for cough, hemoptysis, shortness of breath and snoring.   Endocrine: Negative for heat intolerance and polyphagia.  Hematologic/Lymphatic: Negative for bleeding problem. Does not bruise/bleed easily.  Skin: Negative for flushing, nail changes, rash and suspicious lesions.  Musculoskeletal: Negative for arthritis, joint pain, muscle cramps, myalgias, neck pain and stiffness.  Gastrointestinal: Negative for abdominal pain, bowel incontinence, diarrhea and excessive appetite.  Genitourinary: Negative for decreased libido, genital sores and incomplete emptying.  Neurological: Negative for brief paralysis, focal weakness, headaches and loss of balance.  Psychiatric/Behavioral: Negative for altered mental status, depression and suicidal ideas.  Allergic/Immunologic: Negative for HIV exposure and persistent infections.    EKGs/Labs/Other Studies Reviewed:    The following studies were reviewed today:   EKG:  The ekg ordered today demonstrates sinus rhythm with arrhythmia, heart rate 60 bpm with occasional PVCs.  Coronary CTA - IMPRESSION: 1. Coronary calcium score of 132. This was 72 percentile for age and sex matched control.  2. Normal coronary origin with right dominance.  3. Moderate Coronary Artery Disease. CADRADS 3.   Zio monitor Conclusion: This study is markable for the following:                             1.   Nonsustained ventricular tachycardia ( 17 episodes).                               2. 489 runs of Supraventricular Tachycardia which is likely atrial tachycardia with variable block.                              3. Symptomatic Frequent Premature atrial complexes (8.6%, 119775).                             4. Symptomatic Occasional premature ventricular complexes (1.6%, 22319).   Echo IMPRESSIONS  1. Left ventricular ejection fraction, by estimation, is 60 to 65%. The left ventricle has normal function. The left ventricle has no regional wall motion abnormalities. Left ventricular diastolic parameters are indeterminate.  2. Right ventricular systolic function is normal. The right ventricular size is normal. There is mildly elevated pulmonary artery systolic pressure.  3. Left atrial size was mildly dilated.  4. The mitral valve is normal in structure. Moderate mitral valve regurgitation. No evidence of mitral stenosis.  5. Tricuspid valve regurgitation is mild to moderate.  6. The aortic valve is normal in structure. Aortic valve regurgitation is mild to moderate. No aortic stenosis is present.  7. The inferior vena cava is normal in size with greater than 50% respiratory variability, suggesting right atrial pressure of 3 mmHg.  Recent Labs: 04/10/2019: Hemoglobin 14.2; Platelets 259.0; TSH 2.89 09/20/2019: BUN 16; Creatinine, Ser 0.99; Potassium 4.7; Sodium 141  Recent Lipid Panel    Component Value Date/Time   CHOL 171 08/22/2018 0924   TRIG 97.0 08/22/2018 0924   HDL 71.50 08/22/2018 0924   CHOLHDL 2 08/22/2018 0924   VLDL 19.4 08/22/2018 0924   LDLCALC 80 08/22/2018 0924   LDLDIRECT 158.2 05/07/2013 0928    Physical Exam:    VS:  BP 100/70 (BP Location: Right Arm, Patient Position: Sitting, Cuff Size: Normal)   Pulse 60   Ht 5' 4.5" (1.638 m)   Wt 123 lb (55.8 kg)   SpO2 96%   BMI 20.79 kg/m     Wt Readings from Last 3 Encounters:  10/17/19 123 lb (55.8 kg)  09/04/19  123 lb (55.8 kg)  07/19/19 125 lb (56.7 kg)     GEN: Well nourished, well developed in no acute distress HEENT: Normal NECK: No JVD; No carotid bruits LYMPHATICS: No lymphadenopathy CARDIAC: S1S2 noted,RRR, no murmurs, rubs, gallops RESPIRATORY:  Clear to auscultation without rales, wheezing or rhonchi  ABDOMEN: Soft, non-tender, non-distended, +bowel sounds, no guarding. EXTREMITIES: No edema, No cyanosis, no clubbing MUSCULOSKELETAL:  No deformity  SKIN: Warm and dry NEUROLOGIC:  Alert and oriented x 3, non-focal PSYCHIATRIC:  Normal affect, good insight  ASSESSMENT:    1. Essential hypertension   2. Aortic atherosclerosis (HCC)   3. Non-sustained ventricular tachycardia (HCC)   4. Frequent PAC (premature atrial contraction)   5. Moderate mitral regurgitation   6. Mixed hyperlipidemia   7. Moderate aortic regurgitation    PLAN:     1.  The patient shared with me her home blood pressure readings which her systolic is averaging between 95-101.  At this time we will review her blood pressure medication we will stop her amlodipine 10 mg daily.  She will continue taking losartan 12.5 mg daily as well as acebutolol 200 mg twice daily.  2.  Her symptoms of palpitation is improving since being on acebutolol.  I am hoping that her fatigue is related to her hypotension and the stop of the amlodipine will help.  3.    We discussed all of her testing results including her ZIO, her coronary CTA as well as echocardiogram.  She had questions to her different diagnoses which I was able to answer for the patient to her satisfaction.  She has started walking again and she enjoys that I did encourage patient to continue to do so.  4.  She will continue aspirin 81 mg daily as well as her increased dose of Lipitor 40 mg daily for coronary artery disease.  She plans to see her PCP soon and they are planning repeat lipid profile.  Her current LDL is 80 her target is less than 70.  I discussed with the  patient if she does not meet her target adding Zetia 10 mg daily will be considered.  5.  No clinical signs of heart failure in the setting of her valvular regurgitant disease.  The patient is in agreement with the above plan. The patient left the office in stable condition.  The patient will follow up in 3 months or sooner if needed.   Medication Adjustments/Labs and Tests Ordered: Current medicines are reviewed at length with the patient today.  Concerns regarding medicines are outlined above.  No orders of the defined types were placed in this encounter.  No orders of the defined types were placed in this encounter.   Patient Instructions  Medication Instructions:  Your physician has recommended you make the following change in your medication:   Stop Amlodipine.  *If you need a refill on your cardiac medications before your next appointment, please call your pharmacy*   Lab Work: None ordered If you have labs (blood work) drawn today and your tests are completely normal, you will receive your results only by: . MyChart Message (if you have MyChart) OR . A paper copy in the mail If you have any lab test that is abnormal or we need to change your treatment, we will call you to review the results.   Testing/Procedures: None ordered   Follow-Up: At [facility], you and your health needs are our priority.  As part of our continuing mission to provide you with exceptional heart care, we have created designated Provider Care Teams.  These Care Teams include your primary Cardiologist (physician) and Advanced Practice Providers (APPs -  Physician Assistants and Nurse Practitioners) who all work together to provide you with the care you need, when you need it.  We recommend signing up for the patient portal called "MyChart".  Sign up information is provided on this After Visit Summary.  MyChart is used to connect with patients for Virtual Visits (Telemedicine).  Patients are able  to view lab/test results, encounter notes, upcoming appointments, etc.  Non-urgent messages can be sent to your provider as well.   To learn more about what you can do with MyChart, go to [email or web address].    Your next appointment:   2 month(s)  The format for your next appointment:   In Person  Provider:   Kardie Tobb, DO   Other Instructions NA    Adopting a Healthy Lifestyle.  Know what a healthy weight is for you (roughly BMI <25) and aim to maintain this   Aim for 7+ servings of fruits and vegetables daily   65-80+ fluid ounces of water or unsweet tea for healthy kidneys   Limit to max 1 drink of alcohol per day; avoid smoking/tobacco   Limit animal fats in diet for cholesterol and heart health - choose grass fed whenever available   Avoid   highly processed foods, and foods high in saturated/trans fats   Aim for low stress - take time to unwind and care for your mental health   Aim for 150 min of moderate intensity exercise weekly for heart health, and weights twice weekly for bone health   Aim for 7-9 hours of sleep daily   When it comes to diets, agreement about the perfect plan isnt easy to find, even among the experts. Experts at the [employer] developed an idea known as the Healthy Eating Plate. Just imagine a plate divided into logical, healthy portions.   The emphasis is on diet quality:   Load up on vegetables and fruits - one-half of your plate: Aim for color and variety, and remember that potatoes dont count.   Go for whole grains - one-quarter of your plate: Whole wheat, barley, wheat berries, quinoa, oats, brown rice, and foods made with them. If you want pasta, go with whole wheat pasta.   Protein power - one-quarter of your plate: Fish, chicken, beans, and nuts are all healthy, versatile protein sources. Limit red meat.   The diet, however, does go beyond the plate, offering a few other suggestions.   Use healthy plant  oils, such as olive, canola, soy, corn, sunflower and peanut. Check the labels, and avoid partially hydrogenated oil, which have unhealthy trans fats.   If youre thirsty, drink water. Coffee and tea are good in moderation, but skip sugary drinks and limit milk and dairy products to one or two daily servings.   The type of carbohydrate in the diet is more important than the amount. Some sources of carbohydrates, such as vegetables, fruits, whole grains, and beans-are healthier than others.   Finally, stay active  Signed, Berniece Salines, DO  10/17/2019 9:40 AM    [place]

## 2019-10-17 NOTE — Patient Instructions (Signed)
Medication Instructions:  Your physician has recommended you make the following change in your medication:   Stop Amlodipine.  *If you need a refill on your cardiac medications before your next appointment, please call your pharmacy*   Lab Work: None ordered If you have labs (blood work) drawn today and your tests are completely normal, you will receive your results only by: Marland Kitchen MyChart Message (if you have MyChart) OR . A paper copy in the mail If you have any lab test that is abnormal or we need to change your treatment, we will call you to review the results.   Testing/Procedures: None ordered   Follow-Up: At [facility], you and your health needs are our priority.  As part of our continuing mission to provide you with exceptional heart care, we have created designated Provider Care Teams.  These Care Teams include your primary Cardiologist (physician) and Advanced Practice Providers (APPs -  Physician Assistants and Nurse Practitioners) who all work together to provide you with the care you need, when you need it.  We recommend signing up for the patient portal called "MyChart".  Sign up information is provided on this After Visit Summary.  MyChart is used to connect with patients for Virtual Visits (Telemedicine).  Patients are able to view lab/test results, encounter notes, upcoming appointments, etc.  Non-urgent messages can be sent to your provider as well.   To learn more about what you can do with MyChart, go to [email or web address].    Your next appointment:   2 month(s)  The format for your next appointment:   In Person  Provider:   Berniece Salines, DO   Other Instructions NA

## 2019-10-26 ENCOUNTER — Other Ambulatory Visit: Payer: Self-pay | Admitting: Cardiology

## 2019-12-25 ENCOUNTER — Ambulatory Visit: Payer: Medicare PPO | Admitting: Family Medicine

## 2019-12-25 ENCOUNTER — Encounter: Payer: Self-pay | Admitting: Family Medicine

## 2019-12-25 ENCOUNTER — Other Ambulatory Visit: Payer: Self-pay

## 2019-12-25 VITALS — BP 133/74 | HR 57 | Temp 98.1°F | Ht 64.25 in | Wt 129.0 lb

## 2019-12-25 DIAGNOSIS — E782 Mixed hyperlipidemia: Secondary | ICD-10-CM | POA: Diagnosis not present

## 2019-12-25 DIAGNOSIS — I2510 Atherosclerotic heart disease of native coronary artery without angina pectoris: Secondary | ICD-10-CM | POA: Diagnosis not present

## 2019-12-25 DIAGNOSIS — I10 Essential (primary) hypertension: Secondary | ICD-10-CM | POA: Diagnosis not present

## 2019-12-25 DIAGNOSIS — Z23 Encounter for immunization: Secondary | ICD-10-CM | POA: Diagnosis not present

## 2019-12-25 DIAGNOSIS — I700 Atherosclerosis of aorta: Secondary | ICD-10-CM

## 2019-12-25 LAB — CBC
HCT: 40.6 % (ref 36.0–46.0)
Hemoglobin: 13.6 g/dL (ref 12.0–15.0)
MCHC: 33.5 g/dL (ref 30.0–36.0)
MCV: 87.7 fl (ref 78.0–100.0)
Platelets: 207 10*3/uL (ref 150.0–400.0)
RBC: 4.63 Mil/uL (ref 3.87–5.11)
RDW: 14.5 % (ref 11.5–15.5)
WBC: 6.6 10*3/uL (ref 4.0–10.5)

## 2019-12-25 LAB — LIPID PANEL
Cholesterol: 148 mg/dL (ref 0–200)
HDL: 67.9 mg/dL
LDL Cholesterol: 63 mg/dL (ref 0–99)
NonHDL: 79.88
Total CHOL/HDL Ratio: 2
Triglycerides: 84 mg/dL (ref 0.0–149.0)
VLDL: 16.8 mg/dL (ref 0.0–40.0)

## 2019-12-25 MED ORDER — LOSARTAN POTASSIUM 25 MG PO TABS: 12.5000 mg | ORAL_TABLET | Freq: Every day | ORAL | 1 refills | Status: DC

## 2019-12-25 MED ORDER — OMEPRAZOLE 40 MG PO CPDR: 40.0000 mg | DELAYED_RELEASE_CAPSULE | Freq: Every day | ORAL | 1 refills | Status: DC

## 2019-12-25 MED ORDER — SUCRALFATE 1 GM/10ML PO SUSP: 1.0000 g | Freq: Four times a day (QID) | ORAL | 5 refills | Status: DC

## 2019-12-25 NOTE — Patient Instructions (Addendum)
Next appt in 5.5 months- can be scheduled as CPE if due.  Great to see you today.

## 2019-12-25 NOTE — Progress Notes (Signed)
This visit occurred during the SARS-CoV-2 public health emergency.  Safety protocols were in place, including screening questions prior to the visit, additional usage of staff PPE, and extensive cleaning of exam room while observing appropriate contact time as indicated for disinfecting solutions.    Yvette Short , 1945/04/02, 74 y.o., female MRN: [id number] Patient Care Team    Relationship Specialty Notifications Start End  Ma Hillock, DO PCP - General Family Medicine  08/03/17   Berniece Salines, DO PCP - Cardiology Cardiology  09/04/19   Dian Queen, MD Consulting Physician Obstetrics and Gynecology  08/03/17   Tanda Rockers, MD Consulting Physician Pulmonary Disease  08/03/17   Lyndal Pulley, DO Consulting Physician Sports Medicine  08/03/17   Celedonio Miyamoto, MD Referring Physician Gastroenterology  08/03/17   Marica Otter, [place]  Optometry  08/03/17     Chief Complaint  Patient presents with  . Follow-up    CMC; pt is fasting      Subjective: .anem is a 74 y.o. female present for [facility] follow up Dizziness:  Although she states hre dizziness has improved since last visit, she endorses a rather significant episode last Thursday when she was sitting on the couch. The room started spinning and she became nauseated. She states the dizziness only lasted a "couple minutes." The nausea lasted a few hours. She denies palpitations, headache, dyspnea or chest pain during that time.  Prior  Note: Pt presents for an OV with complaints of lower back discomfort after fall that occurred yesterday at 4 PM.  Patient reports she is uncertain if she became mildly dizzy or lost her footing.  She does endorse waking up from a nap and was still a little groggy when she fell onto a carpeted area off of her bedroom.  States she was able to stand up immediately without any symptoms.  Since that time she has had some mild lower back discomfort points to her sacral area.  She took Tylenol for discomfort  last night and it was helpful.  She has a history of spinal stenosis under treatment with Dr. Tamala Julian.  She is concerned that she is getting dizzy.  She states she thinks she might have been mildly dizzy yesterday and she had an occurrence over the summer in which she stated she "could not walk straight ".  The occurrence in the summertime was after walking in the park on a hot day.  She states she felt like she was veering left and right and unable to walk straight.  She reports those symptoms lasted about 15 minutes and resolved after she was able to eat and drink.  Essential hypertension/hyperlipidemia/statin use Pt reports compliancewith losartan 12.5 mg QD, statin, asa and cardiology managing acebutolol. Patient denies chest pain, shortness of breath, dizziness or lower extremity edema.   Diet: low sodium Exercise: routine exercise RF: htn, hld, former smoker, fhx HD EKG 2012: Multiple PAC appreciated. 5/15/2019EKG: SR. HR 63. Frequent PAC, PR 136 QT 442. Unchanged from prior EKG.    Chronic cough/Pharyngoesophageal dysphagia/Bronchiectasis without complication (HCC)/GERD Pt reports symptoms are controlled on omeprazole and carafate prn.  Prior note: Evaluated by Pulm (Dr. Melvyn Novas), ENT and had EGD 2017 by Dr. Kenton Kingfisher. Pt reports she has had a chronic coughsince before 2016. She was originally started omeprazole after EGD. She was told she has a leaky stomach valve, but Short barrett's esophagus. Her omeprazole was increased to 80 mg a day by Dr. Melvyn Novas. He also recommended  she increase gabapentin to BID, but she was unable to tolerate increase dose. She has been prescribed tessalon perles.  She has had occassions  in which food becomes stuck or slow transit to stomach. Her colonoscopy is UTD completed 03/2017 with 1 polyp. 5 year recall.  She is a former smoker.She has frequent attacks after exercise as well. - taking  Carafate (originally started by GYN))>> which she feels is working but still has  cough.   Ct Chest High Resolution Result Date: 07/12/2017-->IMPRESSION: 1. Scattered minimal cylindrical and varicoid bronchiectasis with associated scattered mucoid impaction and minimal tree-in-bud opacity in both lungs, predominantly in the mid to lower lungs. Findings are stable to slightly worsened since 2015 chest CT. Findings may be due to chronic infectious bronchiolitis from atypical mycobacterial infection (MAI). 2. Two vessel coronary atherosclerosis. Aortic Atherosclerosis (ICD10-I70.0). Electronically Signed By: Ilona Sorrel M.D. On: 07/12/2017 12:56  ROS: See pertinent positives and negatives per HPI Depression screen [facility] 2/9 12/25/2019 02/07/2019 08/03/2017 05/07/2013  Decreased Interest 1 1 0 0  Down, Depressed, Hopeless 0 0 0 0  PHQ - 2 Score 1 1 0 0  Altered sleeping 0 - - -  Tired, decreased energy 2 - - -  Change in appetite 1 - - -  Feeling bad or failure about yourself  0 - - -  Trouble concentrating 0 - - -  Moving slowly or fidgety/restless 0 - - -  Suicidal thoughts 0 - - -  PHQ-9 Score 4 - - -    Allergies  Allergen Reactions  . Tetracycline Hcl Rash        Social History   Social History Narrative   Married. Two children.    College grad.    Former smoker.    Exercises routinely.    Drink caffeine.    Wears a hearing aid.    Smoke alarm in the home.    Wears her seatbelt.    Feels safe in her relationships.    Past Medical History:  Diagnosis Date  . Abnormal colonoscopy 03/2017   decreased rectal tone and polyp; 5 year recall  . Abnormal findings on esophagogastroduodenoscopy (EGD) 02/25/2016   Z-line irrg. at 40 cm. Short abnomrality of the esophagus to explain dysphagia. Esophagus dilated. H/o + H. Pylori  . Allergy   . ANXIETY 07/06/2007  . Aortic atherosclerosis ([place]) 02/08/2018  . Aortic valve regurgitation   . Atherosclerosis of native coronary artery of native heart without angina pectoris 02/08/2018  . Biceps tendonitis on right   .  Bronchiectasis without complication ([place]) 73/53/2992   CT 2012 IMPRESSION:  1. Apical scarring may account for the plain film abnormality.  2. A 6 mm right upper lobe pulmonary nodule.  - - HRCT 07/12/2017 >>>  Scattered minimal cylindrical and varicoid bronchiectasis with associated scattered mucoid impaction and minimal tree-in-bud opacity in both lungs, predominantly in the mid to lower lungs. Findings are stable to slightly worsened since 2015 ches  . Chicken pox   . Chronic cough 05/18/2017   CT chest 03/19/14  Short bronchiectasis (not hrct)  Spirometry 05/18/2017  FEV1  2.15 (93%)  Ratio 76 s rx prior  - FENO 05/18/2017  =   Could not perform   - Allergy profile 05/18/2017 >  Eos 0.2 /  IgE  75 RAST pos cat > dog > mold - HRCT 07/12/2017 >>>  See bronchiectasis - trial of dymista 07/26/2017 > some better but not consistent with it > rechallenge rx one  puff each am 10/25/2017  - MCT 11/02/17 >    . Colon polyp 03/31/2017   hyperplastic  . Diverticulosis 03/2017   sigmoid  . Essential hypertension 12/21/2010  . Family history of breast cancer   . Family history of colon cancer   . Family history of ovarian cancer   . Frequent UTI   . Genetic testing 03/26/2016   Negative genetic testing on the [facility] 30 gene panel.  The 30-Gene Cancer Panel offered by [facility] includes sequencing and/or deletion duplication testing of the following 30 genes: APC, ATM, BAP1, BARD1, BMPR1A, BRCA1, BRCA2, BRIP1, CDH1, CDK4, CDKN2A (p14ARF), CDKN2A (p16INK4a), CHEK2, EPCAM, GREM1, MITF, MLH1, MSH2, MSH6, MUTYH, NBN, PALB2, PMS2, POLD1, POLE, PTEN, RAD51C, RAD51D, SMAD4,   . GERD 11/09/2006  . H. pylori infection    h/o treated wiht prevpac  . Hormone replacement therapy 01/08/2019  . HYPERLIPIDEMIA 11/09/2006  . Hyperlipidemia 11/09/2006   Qualifier: Diagnosis of  By: Leanne Chang MD, [place]    . Hypertension   . Hypothyroidism 08/03/2017  . Lumbar radiculopathy 07/24/2014  . Migraine   . Mild depression ([place]) 02/14/2019  .  Pharyngoesophageal dysphagia 08/03/2017  . Piriformis syndrome of right side   . Spinal stenosis, lumbar region, with neurogenic claudication 10/16/2014   Epidural 10/02/2014, December 2016   . Stomach ulcer 2008   Past Surgical History:  Procedure Laterality Date  . CERVICAL POLYPECTOMY  01/17/2019  . LIPOMA EXCISION     of back  . TUBAL LIGATION     Family History  Problem Relation Age of Onset  . Dementia Mother   . Hyperlipidemia Mother   . Colon cancer Father 18  . Colon polyps Brother        gets colonoscopy every 2-3 years  . Hyperlipidemia Brother   . Hypertension Brother   . Uterine cancer Maternal Aunt        dx in her 52s  . Colon cancer Maternal Uncle   . Colon cancer Paternal Aunt   . Heart attack Maternal Grandfather   . Cancer Other        MGMs sister - "abdominal cancer"  . Breast cancer Cousin    Allergies as of 12/25/2019      Reactions   Tetracycline Hcl Rash          Medication List       Accurate as of December 25, 2019  9:01 AM. If you have any questions, ask your nurse or doctor.        STOP taking these medications   Adult Blood Pressure Cuff Lg Kit Stopped by: Howard Pouch, DO   ascorbic acid 250 MG Chew Commonly known as: VITAMIN C Stopped by: Howard Pouch, DO     TAKE these medications   acebutolol 200 MG capsule Commonly known as: SECTRAL TAKE 1 CAPSULE BY MOUTH TWICE A DAY   aspirin EC 81 MG tablet Take 81 mg by mouth daily. Swallow whole. What changed: Another medication with the same name was removed. Continue taking this medication, and follow the directions you see here. Changed by: Howard Pouch, DO   atorvastatin 40 MG tablet Commonly known as: Lipitor Take 1 tablet (40 mg total) by mouth daily.   Azelaic Acid 15 % cream APPLY TO AFFECTED AREA ON FACE UP TO TWO TIMES A DAY AS NEEDED   chlorpheniramine 4 MG tablet Commonly known as: CHLOR-TRIMETON Take 4 mg by mouth every 4 (four) hours as needed for allergies.    estradiol  0.0375 MG/24HR Commonly known as: VIVELLE-DOT Place 1 patch onto the skin 2 (two) times a week.   gabapentin 100 MG capsule Commonly known as: NEURONTIN TAKE 2-3 CAPSULES (200-300 MG TOTAL) BY MOUTH AT BEDTIME.   levothyroxine 88 MCG tablet Commonly known as: SYNTHROID Take 88 mcg by mouth daily.   losartan 25 MG tablet Commonly known as: COZAAR Take 0.5 tablets (12.5 mg total) by mouth daily.   omeprazole 40 MG capsule Commonly known as: PRILOSEC Take 1 capsule (40 mg total) by mouth daily. Take 30- 60 min before your first meal   progesterone 100 MG capsule Commonly known as: PROMETRIUM Take by mouth.   sucralfate 1 GM/10ML suspension Commonly known as: CARAFATE Take 10 mLs (1 g total) by mouth in the morning, at noon, in the evening, and at bedtime. Every other day   venlafaxine XR 37.5 MG 24 hr capsule Commonly known as: EFFEXOR-XR TAKE 1 CAPSULE BY MOUTH DAILY WITH BREAKFAST.   Vitamin D2 50 MCG (2000 UT) Tabs Take 1 tablet by mouth daily.       All past medical history, surgical history, allergies, family history, immunizations andmedications were updated in the EMR today and reviewed under the history and medication portions of their EMR.     ROS: Negative, with the exception of above mentioned in HPI   Objective:  BP 133/74   Pulse (!) 57   Temp 98.1 F (36.7 C) (Oral)   Ht 5' 4.25" (1.632 m)   Wt 129 lb (58.5 kg)   SpO2 100%   BMI 21.97 kg/m  Body mass index is 21.97 kg/m. Gen: Afebrile. Short acute distress.  HENT: AT. [place].  Eyes:Pupils Equal Round Reactive to light, Extraocular movements intact,  Conjunctiva without redness, discharge or icterus. CV: RRR Short murmur, Short edema, Chest: CTAB, Short wheeze or crackles Abd: Soft. NTND. BS presen Neuro: Normal gait. PERLA. EOMi. Alert. Oriented x3  Psych: Normal affect, dress and demeanor. Normal speech. Normal thought content and judgment.   Short exam data present Short results found. Short results  found for this or any previous visit (from the past 24 hour(s)).  Assessment/Plan: Yvette Short is a 74 y.o. female present for OV for  Essential hypertension/hyperlipidemia/palpitations/aortic ad coronary atherosclerosis  - stable. Mild increase in energy since stopping amlodipine.  -  Losartan 12.5 mg daily.  - cardio discontinue amlodipine 10 mg QD and started acebutolol 200 mg BID - continue statin - continue asa - cbc, lipid collected today.  - Exercise: routine exercise - F/U 5.5 months.   Chronic cough/Pharyngoesophageal dysphagia/Bronchiectasis without complication (HCC)/GERD - chronic coughstarted ~2016, on high dose omeprazole. Last EGD 2017, Dr. Kenton Kingfisher - pulmonology(Dr. Wert)did not feel it was caused by lung condition. Ct abnormal w/ tree in bud.  - [facility] and Dr. Collene Mares office did not accept her bc they did not feel they could offer her more. -  Encouraged her to contiue to follow with Dr. Kenton Kingfisher and if needed could try to refer to [facility] for another opinion or [facility]>>> now on carafate and seems to be helping.  Encouraged her to restart the PPI as well as continue the Carafate.  contniue omeprazole.  Continue carafate prn - tried albuterol before exercise>> patient reported it was not helpful. - F/U PRN  Flu shot administered today     Reviewed expectations re: course of current medical issues.  Discussed self-management of symptoms.  Outlined signs and symptoms indicating need for more acute intervention.  Patient  verbalized understanding and all questions were answered.  Patient received an After-Visit Summary.   Orders Placed This Encounter  Procedures  . Flu Vaccine QUAD High Dose(Fluad)  . CBC  . Lipid panel   Meds ordered this encounter  Medications  . losartan (COZAAR) 25 MG tablet    Sig: Take 0.5 tablets (12.5 mg total) by mouth daily.    Dispense:  45 tablet    Refill:  1  . omeprazole (PRILOSEC) 40 MG capsule    Sig: Take  1 capsule (40 mg total) by mouth daily. Take 30- 60 min before your first meal    Dispense:  90 capsule    Refill:  1  . sucralfate (CARAFATE) 1 GM/10ML suspension    Sig: Take 10 mLs (1 g total) by mouth in the morning, at noon, in the evening, and at bedtime. Every other day    Dispense:  420 mL    Refill:  5     Note is dictated utilizing voice recognition software. Although note has been proof read prior to signing, occasional typographical errors still can be missed. If any questions arise, please do not hesitate to call for verification.   electronically signed by:  Howard Pouch, DO  [place]

## 2019-12-31 ENCOUNTER — Encounter: Payer: Self-pay | Admitting: Cardiology

## 2019-12-31 ENCOUNTER — Ambulatory Visit (INDEPENDENT_AMBULATORY_CARE_PROVIDER_SITE_OTHER): Payer: Medicare PPO | Admitting: Cardiology

## 2019-12-31 ENCOUNTER — Other Ambulatory Visit: Payer: Self-pay

## 2019-12-31 VITALS — BP 120/90 | HR 60 | Ht 64.25 in | Wt 125.0 lb

## 2019-12-31 DIAGNOSIS — I472 Ventricular tachycardia: Secondary | ICD-10-CM | POA: Diagnosis not present

## 2019-12-31 DIAGNOSIS — I4729 Other ventricular tachycardia: Secondary | ICD-10-CM

## 2019-12-31 DIAGNOSIS — I351 Nonrheumatic aortic (valve) insufficiency: Secondary | ICD-10-CM | POA: Diagnosis not present

## 2019-12-31 DIAGNOSIS — I10 Essential (primary) hypertension: Secondary | ICD-10-CM

## 2019-12-31 DIAGNOSIS — I491 Atrial premature depolarization: Secondary | ICD-10-CM | POA: Diagnosis not present

## 2019-12-31 DIAGNOSIS — I2510 Atherosclerotic heart disease of native coronary artery without angina pectoris: Secondary | ICD-10-CM

## 2019-12-31 DIAGNOSIS — I340 Nonrheumatic mitral (valve) insufficiency: Secondary | ICD-10-CM

## 2019-12-31 MED ORDER — ACEBUTOLOL HCL 200 MG PO CAPS: 200.0000 mg | ORAL_CAPSULE | Freq: Every day | ORAL | 2 refills | Status: DC

## 2019-12-31 NOTE — Progress Notes (Signed)
Cardiology Office Note:    Date:  12/31/2019   ID:  CHALON ZOBRIST, DOB 1946-03-05, MRN [id number]  PCP:  Ma Hillock, DO  Cardiologist:  Berniece Salines, DO  Electrophysiologist:  None   Referring MD: Ma Hillock, DO   Chief Complaint  Patient presents with  . Follow-up  follow up visit   History of Present Illness:    Yvette Short is a 74 y.o. female with a hx of hypertension, hyperlipidemia, hypothyroidism, moderate coronary artery disease by coronary CTA, frequent PACs and PVCs, moderate mitral regurgitation, moderate aortic regurgitation presents today for follow-up visit.  I  saw the patient on September 04, 2019 at that time we discussed her monitor results which showed frequent PACs as well as PVCs.  I did start the patient on acebutolol 200 mg twice daily. That day she also reported that she was experiencing chest pain we ordered coronary CTA which she was able to get in the interim showed moderate coronary artery disease.  I did see the patient in July 2021 at that time she was reporting significant fatigue and her blood pressure was running between 90 systolic to 001 systolic.  I did not stop her amlodipine.  In addition giving her CTA showing coronary artery disease I increased her Lipitor to 40 mg daily.  In the interim she did see her PCP and had blood work done.  Her fatigue has improved but not completely resolved.  Past Medical History:  Diagnosis Date  . Abnormal colonoscopy 03/2017   decreased rectal tone and polyp; 5 year recall  . Abnormal findings on esophagogastroduodenoscopy (EGD) 02/25/2016   Z-line irrg. at 40 cm. No abnomrality of the esophagus to explain dysphagia. Esophagus dilated. H/o + H. Pylori  . Allergy   . ANXIETY 07/06/2007  . Aortic atherosclerosis ([place]) 02/08/2018  . Aortic valve regurgitation   . Atherosclerosis of native coronary artery of native heart without angina pectoris 02/08/2018  . Biceps tendonitis on right   .  Bronchiectasis without complication ([place]) 74/[id number]/4967   CT 2012 IMPRESSION:  1. Apical scarring may account for the plain film abnormality.  2. A 6 mm right upper lobe pulmonary nodule.  - - HRCT 07/12/2017 >>>  Scattered minimal cylindrical and varicoid bronchiectasis with associated scattered mucoid impaction and minimal tree-in-bud opacity in both lungs, predominantly in the mid to lower lungs. Findings are stable to slightly worsened since 2015 ches  . Chicken pox   . Chronic cough 05/18/2017   CT chest 03/19/14  No bronchiectasis (not hrct)  Spirometry 05/18/2017  FEV1  2.15 (93%)  Ratio 76 s rx prior  - FENO 05/18/2017  =   Could not perform   - Allergy profile 05/18/2017 >  Eos 0.2 /  IgE  75 RAST pos cat > dog > mold - HRCT 07/12/2017 >>>  See bronchiectasis - trial of dymista 07/26/2017 > some better but not consistent with it > rechallenge rx one puff each am 10/25/2017  - MCT 11/02/17 >    . Colon polyp 03/31/2017   hyperplastic  . Diverticulosis 03/2017   sigmoid  . Essential hypertension 12/21/2010  . Family history of breast cancer   . Family history of colon cancer   . Family history of ovarian cancer   . Frequent UTI   . Genetic testing 03/26/2016   Negative genetic testing on the [facility] 30 gene panel.  The 30-Gene Cancer Panel offered by [facility] includes sequencing and/or deletion duplication testing of  the following 30 genes: APC, ATM, BAP1, BARD1, BMPR1A, BRCA1, BRCA2, BRIP1, CDH1, CDK4, CDKN2A (p14ARF), CDKN2A (p16INK4a), CHEK2, EPCAM, GREM1, MITF, MLH1, MSH2, MSH6, MUTYH, NBN, PALB2, PMS2, POLD1, POLE, PTEN, RAD51C, RAD51D, SMAD4,   . GERD 11/09/2006  . H. pylori infection    h/o treated wiht prevpac  . Hormone replacement therapy 01/08/2019  . HYPERLIPIDEMIA 11/09/2006  . Hyperlipidemia 11/09/2006   Qualifier: Diagnosis of  By: Leanne Chang MD, Bruce    . Hypertension   . Hypothyroidism 08/03/2017  . Lumbar radiculopathy 07/24/2014  . Migraine   . Mild depression ([place]) 02/14/2019  .  Pharyngoesophageal dysphagia 08/03/2017  . Piriformis syndrome of right side   . Spinal stenosis, lumbar region, with neurogenic claudication 10/16/2014   Epidural 10/02/2014, December 2016   . Stomach ulcer 2008    Past Surgical History:  Procedure Laterality Date  . CERVICAL POLYPECTOMY  01/17/2019  . LIPOMA EXCISION     of back  . TUBAL LIGATION      Current Medications: Current Meds  Medication Sig  . acebutolol (SECTRAL) 200 MG capsule Take 1 capsule (200 mg total) by mouth daily.  Marland Kitchen aspirin EC 81 MG tablet Take 81 mg by mouth daily. Swallow whole.  Marland Kitchen atorvastatin (LIPITOR) 40 MG tablet Take 1 tablet (40 mg total) by mouth daily.  . Azelaic Acid 15 % cream APPLY TO AFFECTED AREA ON FACE UP TO TWO TIMES A DAY AS NEEDED  . chlorpheniramine (CHLOR-TRIMETON) 4 MG tablet Take 4 mg by mouth every 4 (four) hours as needed for allergies.  . Ergocalciferol (VITAMIN D2) 2000 units TABS Take 1 tablet by mouth daily.  Marland Kitchen estradiol (VIVELLE-DOT) 0.0375 MG/24HR Place 1 patch onto the skin 2 (two) times a week.  . gabapentin (NEURONTIN) 100 MG capsule TAKE 2-3 CAPSULES (200-300 MG TOTAL) BY MOUTH AT BEDTIME.  Marland Kitchen levothyroxine (SYNTHROID, LEVOTHROID) 88 MCG tablet Take 88 mcg by mouth daily.  Marland Kitchen losartan (COZAAR) 25 MG tablet Take 0.5 tablets (12.5 mg total) by mouth daily.  Marland Kitchen omeprazole (PRILOSEC) 40 MG capsule Take 1 capsule (40 mg total) by mouth daily. Take 30- 60 min before your first meal  . progesterone (PROMETRIUM) 100 MG capsule Take by mouth.  . sucralfate (CARAFATE) 1 GM/10ML suspension Take 10 mLs (1 g total) by mouth in the morning, at noon, in the evening, and at bedtime. Every other day  . venlafaxine XR (EFFEXOR-XR) 37.5 MG 24 hr capsule TAKE 1 CAPSULE BY MOUTH DAILY WITH BREAKFAST.  . [DISCONTINUED] acebutolol (SECTRAL) 200 MG capsule TAKE 1 CAPSULE BY MOUTH TWICE A DAY     Allergies:   Tetracycline hcl   Social History   Socioeconomic History  . Marital status: Married     Spouse name: Not on file  . Number of children: Not on file  . Years of education: Not on file  . Highest education level: Not on file  Occupational History  . Not on file  Tobacco Use  . Smoking status: Former Smoker    Packs/day: 2.50    Years: 13.00    Pack years: 32.50    Types: Cigarettes    Quit date: 07/28/1974    Years since quitting: 45.4  . Smokeless tobacco: Never Used  Vaping Use  . Vaping Use: Never used  Substance and Sexual Activity  . Alcohol use: Yes    Alcohol/week: 2.0 standard drinks    Types: 2 Cans of beer per week  . Drug use: No  . Sexual activity: Yes  Partners: Male  Other Topics Concern  . Not on file  Social History Narrative   Married. Two children.    College grad.    Former smoker.    Exercises routinely.    Drink caffeine.    Wears a hearing aid.    Smoke alarm in the home.    Wears her seatbelt.    Feels safe in her relationships.    Social Determinants of Health   Financial Resource Strain:   . Difficulty of Paying Living Expenses: Not on file  Food Insecurity:   . Worried About Charity fundraiser in the Last Year: Not on file  . Ran Out of Food in the Last Year: Not on file  Transportation Needs:   . Lack of Transportation (Medical): Not on file  . Lack of Transportation (Non-Medical): Not on file  Physical Activity:   . Days of Exercise per Week: Not on file  . Minutes of Exercise per Session: Not on file  Stress:   . Feeling of Stress : Not on file  Social Connections:   . Frequency of Communication with Friends and Family: Not on file  . Frequency of Social Gatherings with Friends and Family: Not on file  . Attends Religious Services: Not on file  . Active Member of Clubs or Organizations: Not on file  . Attends Archivist Meetings: Not on file  . Marital Status: Not on file     Family History: The patient's family history includes Breast cancer in her cousin; Cancer in an other family member; Colon cancer  in her maternal uncle and paternal aunt; Colon cancer (age of onset: 50) in her father; Colon polyps in her brother; Dementia in her mother; Heart attack in her maternal grandfather; Hyperlipidemia in her brother and mother; Hypertension in her brother; Uterine cancer in her maternal aunt.  ROS:   Review of Systems  Constitution: Negative for decreased appetite, fever and weight gain.  HENT: Negative for congestion, ear discharge, hoarse voice and sore throat.   Eyes: Negative for discharge, redness, vision loss in right eye and visual halos.  Cardiovascular: Negative for chest pain, dyspnea on exertion, leg swelling, orthopnea and palpitations.  Respiratory: Negative for cough, hemoptysis, shortness of breath and snoring.   Endocrine: Negative for heat intolerance and polyphagia.  Hematologic/Lymphatic: Negative for bleeding problem. Does not bruise/bleed easily.  Skin: Negative for flushing, nail changes, rash and suspicious lesions.  Musculoskeletal: Negative for arthritis, joint pain, muscle cramps, myalgias, neck pain and stiffness.  Gastrointestinal: Negative for abdominal pain, bowel incontinence, diarrhea and excessive appetite.  Genitourinary: Negative for decreased libido, genital sores and incomplete emptying.  Neurological: Negative for brief paralysis, focal weakness, headaches and loss of balance.  Psychiatric/Behavioral: Negative for altered mental status, depression and suicidal ideas.  Allergic/Immunologic: Negative for HIV exposure and persistent infections.    EKGs/Labs/Other Studies Reviewed:    The following studies were reviewed today:   EKG:  The ekg ordered today demonstrates sinus bradycardia, heart rate 55 bpm with occasional PVCs and PACs.  Coronary CTA - IMPRESSION: 1. Coronary calcium score of 132. This was 12 percentile for age and sex matched control.  2. Normal coronary origin with right dominance.  3. Moderate Coronary Artery Disease. CADRADS 3.    Zio monitor Conclusion: This study is markable for the following: 1. Nonsustained ventricular tachycardia ( 17 episodes). 2. 489 runs of Supraventricular Tachycardia which is likely atrial tachycardia with variable block.  3. Symptomatic Frequent Premature atrial  complexes (8.6%, [id number]). 4. Symptomatic Occasional premature ventricular complexes (1.6%, 22319).   Echo IMPRESSIONS  1. Left ventricular ejection fraction, by estimation, is 60 to 65%. The left ventricle has normal function. The left ventricle has no regional wall motion abnormalities. Left ventricular diastolic parameters are indeterminate.  2. Right ventricular systolic function is normal. The right ventricular size is normal. There is mildly elevated pulmonary artery systolic pressure.  3. Left atrial size was mildly dilated.  4. The mitral valve is normal in structure. Moderate mitral valve regurgitation. No evidence of mitral stenosis.  5. Tricuspid valve regurgitation is mild to moderate.  6. The aortic valve is normal in structure. Aortic valve regurgitation is mild to moderate. No aortic stenosis is present.  7. The inferior vena cava is normal in size with greater than 50% respiratory variability, suggesting right atrial pressure of 3 mmHg.   Recent Labs: 04/10/2019: TSH 2.89 09/20/2019: BUN 16; Creatinine, Ser 0.99; Potassium 4.7; Sodium 141 12/25/2019: Hemoglobin 13.6; Platelets 207.0  Recent Lipid Panel    Component Value Date/Time   CHOL 148 12/25/2019 0911   TRIG 84.0 12/25/2019 0911   HDL 67.90 12/25/2019 0911   CHOLHDL 2 12/25/2019 0911   VLDL 16.8 12/25/2019 0911   LDLCALC 63 12/25/2019 0911   LDLDIRECT 158.2 05/07/2013 0928    Physical Exam:    VS:  BP 120/90 (BP Location: Right Arm, Patient Position: Sitting, Cuff Size: Normal)   Pulse 60   Ht 5' 4.25" (1.632 m)   Wt 125 lb  (56.7 kg)   SpO2 98%   BMI 21.29 kg/m     Wt Readings from Last 3 Encounters:  12/31/19 125 lb (56.7 kg)  12/25/19 129 lb (58.5 kg)  10/17/19 123 lb (55.8 kg)     GEN: Well nourished, well developed in no acute distress HEENT: Normal NECK: No JVD; No carotid bruits LYMPHATICS: No lymphadenopathy CARDIAC: S1S2 noted,RRR, no murmurs, rubs, gallops RESPIRATORY:  Clear to auscultation without rales, wheezing or rhonchi  ABDOMEN: Soft, non-tender, non-distended, +bowel sounds, no guarding. EXTREMITIES: No edema, No cyanosis, no clubbing MUSCULOSKELETAL:  No deformity  SKIN: Warm and dry NEUROLOGIC:  Alert and oriented x 3, non-focal PSYCHIATRIC:  Normal affect, good insight  ASSESSMENT:    1. Coronary artery disease involving native coronary artery of native heart without angina pectoris   2. Atherosclerosis of native coronary artery of native heart without angina pectoris   3. Essential hypertension   4. Non-sustained ventricular tachycardia ([place])   5. Frequent PAC (premature atrial contraction)   6. Moderate mitral regurgitation   7. Moderate aortic regurgitation    PLAN:    I am going to decrease her acebutolol to 200 mg daily.  I reviewed her lipid profile LDL 63 below target of 70 we will make no changes at this time.  She has been working on her diet I have cut back on a lot of carbs.  She has no signs of angina at this time.Continue patient aspirin and statin.   No signs of heart failure.  The patient is in agreement with the above plan. The patient left the office in stable condition.  The patient will follow up in 6 months or sooner if needed.   Medication Adjustments/Labs and Tests Ordered: Current medicines are reviewed at length with the patient today.  Concerns regarding medicines are outlined above.  Orders Placed This Encounter  Procedures  . EKG 12-Lead   Meds ordered this encounter  Medications  . acebutolol (SECTRAL)  200 MG capsule    Sig: Take 1  capsule (200 mg total) by mouth daily.    Dispense:  90 capsule    Refill:  2    Patient Instructions  Medication Instructions:  Your physician has recommended you make the following change in your medication:  Decrease your Acebutolol to 200 mg daily.  *If you need a refill on your cardiac medications before your next appointment, please call your pharmacy*   Lab Work: None ordered If you have labs (blood work) drawn today and your tests are completely normal, you will receive your results only by: Marland Kitchen MyChart Message (if you have MyChart) OR . A paper copy in the mail If you have any lab test that is abnormal or we need to change your treatment, we will call you to review the results.   Testing/Procedures: None ordered   Follow-Up: At [facility], you and your health needs are our priority.  As part of our continuing mission to provide you with exceptional heart care, we have created designated Provider Care Teams.  These Care Teams include your primary Cardiologist (physician) and Advanced Practice Providers (APPs -  Physician Assistants and Nurse Practitioners) who all work together to provide you with the care you need, when you need it.  We recommend signing up for the patient portal called "MyChart".  Sign up information is provided on this After Visit Summary.  MyChart is used to connect with patients for Virtual Visits (Telemedicine).  Patients are able to view lab/test results, encounter notes, upcoming appointments, etc.  Non-urgent messages can be sent to your provider as well.   To learn more about what you can do with MyChart, go to [email or web address].    Your next appointment:   6 month(s)  The format for your next appointment:   In Person  Provider:   Berniece Salines, DO   Other Instructions NA     Adopting a Healthy Lifestyle.  Know what a healthy weight is for you (roughly BMI <25) and aim to maintain this   Aim for 7+ servings of fruits and  vegetables daily   65-80+ fluid ounces of water or unsweet tea for healthy kidneys   Limit to max 1 drink of alcohol per day; avoid smoking/tobacco   Limit animal fats in diet for cholesterol and heart health - choose grass fed whenever available   Avoid highly processed foods, and foods high in saturated/trans fats   Aim for low stress - take time to unwind and care for your mental health   Aim for 150 min of moderate intensity exercise weekly for heart health, and weights twice weekly for bone health   Aim for 7-9 hours of sleep daily   When it comes to diets, agreement about the perfect plan isnt easy to find, even among the experts. Experts at the [employer] developed an idea known as the Healthy Eating Plate. Just imagine a plate divided into logical, healthy portions.   The emphasis is on diet quality:   Load up on vegetables and fruits - one-half of your plate: Aim for color and variety, and remember that potatoes dont count.   Go for whole grains - one-quarter of your plate: Whole wheat, barley, wheat berries, quinoa, oats, brown rice, and foods made with them. If you want pasta, go with whole wheat pasta.   Protein power - one-quarter of your plate: Fish, chicken, beans, and nuts are all healthy, versatile protein sources. Limit red  meat.   The diet, however, does go beyond the plate, offering a few other suggestions.   Use healthy plant oils, such as olive, canola, soy, corn, sunflower and peanut. Check the labels, and avoid partially hydrogenated oil, which have unhealthy trans fats.   If youre thirsty, drink water. Coffee and tea are good in moderation, but skip sugary drinks and limit milk and dairy products to one or two daily servings.   The type of carbohydrate in the diet is more important than the amount. Some sources of carbohydrates, such as vegetables, fruits, whole grains, and beans-are healthier than others.   Finally, stay  active  Signed, Berniece Salines, DO  12/31/2019 8:56 AM    [place]

## 2019-12-31 NOTE — Patient Instructions (Addendum)
Medication Instructions:  Your physician has recommended you make the following change in your medication:  Decrease your Acebutolol to 200 mg daily.  *If you need a refill on your cardiac medications before your next appointment, please call your pharmacy*   Lab Work: None ordered If you have labs (blood work) drawn today and your tests are completely normal, you will receive your results only by: Marland Kitchen MyChart Message (if you have MyChart) OR . A paper copy in the mail If you have any lab test that is abnormal or we need to change your treatment, we will call you to review the results.   Testing/Procedures: None ordered   Follow-Up: At [facility], you and your health needs are our priority.  As part of our continuing mission to provide you with exceptional heart care, we have created designated Provider Care Teams.  These Care Teams include your primary Cardiologist (physician) and Advanced Practice Providers (APPs -  Physician Assistants and Nurse Practitioners) who all work together to provide you with the care you need, when you need it.  We recommend signing up for the patient portal called "MyChart".  Sign up information is provided on this After Visit Summary.  MyChart is used to connect with patients for Virtual Visits (Telemedicine).  Patients are able to view lab/test results, encounter notes, upcoming appointments, etc.  Non-urgent messages can be sent to your provider as well.   To learn more about what you can do with MyChart, go to [email or web address].    Your next appointment:   6 month(s)  The format for your next appointment:   In Person  Provider:   Berniece Salines, DO   Other Instructions NA

## 2020-01-27 ENCOUNTER — Telehealth: Payer: Self-pay | Admitting: Cardiovascular Disease

## 2020-01-27 NOTE — Telephone Encounter (Signed)
I received a page as the on call provider for cardiology. Yvette Short was trying to contact me regarding her having taken an extra beta blocker dose. I called her back at the number provided ([phone]) twice overnight but no one was available to pick up the phone and take my call.   Jerilynn Mages Kristalynn Coddington

## 2020-02-06 DIAGNOSIS — L218 Other seborrheic dermatitis: Secondary | ICD-10-CM | POA: Diagnosis not present

## 2020-02-06 DIAGNOSIS — L820 Inflamed seborrheic keratosis: Secondary | ICD-10-CM | POA: Diagnosis not present

## 2020-03-26 DIAGNOSIS — Z6821 Body mass index (BMI) 21.0-21.9, adult: Secondary | ICD-10-CM | POA: Diagnosis not present

## 2020-03-26 DIAGNOSIS — Z124 Encounter for screening for malignant neoplasm of cervix: Secondary | ICD-10-CM | POA: Diagnosis not present

## 2020-04-01 ENCOUNTER — Telehealth: Payer: Self-pay | Admitting: Family Medicine

## 2020-04-01 NOTE — Telephone Encounter (Signed)
Left message for patient to schedule Annual Wellness Visit.  Please schedule with Nurse Health Advisor Martha Stanley, RN at [place]  °

## 2020-04-15 ENCOUNTER — Other Ambulatory Visit: Payer: Self-pay

## 2020-04-15 ENCOUNTER — Encounter (HOSPITAL_BASED_OUTPATIENT_CLINIC_OR_DEPARTMENT_OTHER): Payer: Self-pay

## 2020-04-15 ENCOUNTER — Emergency Department (HOSPITAL_BASED_OUTPATIENT_CLINIC_OR_DEPARTMENT_OTHER): Payer: Medicare PPO

## 2020-04-15 ENCOUNTER — Emergency Department (HOSPITAL_BASED_OUTPATIENT_CLINIC_OR_DEPARTMENT_OTHER)
Admission: EM | Admit: 2020-04-15 | Discharge: 2020-04-15 | Disposition: A | Discharge status: Home / Self Care | Payer: Medicare PPO | Attending: Emergency Medicine | Admitting: Emergency Medicine

## 2020-04-15 DIAGNOSIS — E039 Hypothyroidism, unspecified: Secondary | ICD-10-CM | POA: Diagnosis not present

## 2020-04-15 DIAGNOSIS — Z79899 Other long term (current) drug therapy: Secondary | ICD-10-CM | POA: Diagnosis not present

## 2020-04-15 DIAGNOSIS — Z87891 Personal history of nicotine dependence: Secondary | ICD-10-CM | POA: Insufficient documentation

## 2020-04-15 DIAGNOSIS — W000XXA Fall on same level due to ice and snow, initial encounter: Secondary | ICD-10-CM | POA: Insufficient documentation

## 2020-04-15 DIAGNOSIS — Z7982 Long term (current) use of aspirin: Secondary | ICD-10-CM | POA: Diagnosis not present

## 2020-04-15 DIAGNOSIS — I10 Essential (primary) hypertension: Secondary | ICD-10-CM | POA: Diagnosis not present

## 2020-04-15 DIAGNOSIS — W19XXXA Unspecified fall, initial encounter: Secondary | ICD-10-CM

## 2020-04-15 DIAGNOSIS — M25512 Pain in left shoulder: Secondary | ICD-10-CM | POA: Diagnosis not present

## 2020-04-15 DIAGNOSIS — M7989 Other specified soft tissue disorders: Secondary | ICD-10-CM | POA: Diagnosis not present

## 2020-04-15 DIAGNOSIS — S5002XA Contusion of left elbow, initial encounter: Secondary | ICD-10-CM | POA: Diagnosis not present

## 2020-04-15 DIAGNOSIS — S59902A Unspecified injury of left elbow, initial encounter: Secondary | ICD-10-CM | POA: Diagnosis not present

## 2020-04-15 MED ORDER — ACETAMINOPHEN 325 MG PO TABS
650.0000 mg | ORAL_TABLET | Freq: Once | ORAL | Status: DC
  Filled 2020-04-15: qty 2

## 2020-04-15 NOTE — Discharge Instructions (Signed)
Please rest ice and elevate your left elbow.  You may take Tylenol [phone] mg every 6 hours for pain. Please follow-up with your primary care doctor.  Your blood pressure is elevated today but this may be as a result of the pain you are in.

## 2020-04-15 NOTE — ED Provider Notes (Signed)
[facility] EMERGENCY DEPARTMENT Provider Note   CSN: [id number] Arrival date & time: 04/15/20  1215     History Chief Complaint  Patient presents with  . Arm Injury    Yvette Short is a 75 y.o. female.  HPI Patient is a 75 year old female with a past medical history detailed below presented today for left elbow pain after she slipped and fell on ice today.  She fell onto her left elbow and has swelling without bruising laceration or abrasion.  Pain is 6/10 and achy and constant.  Worse with touch and movement.   She states that she slipped on the ice twice and both times fell on her left elbow.  She states that the second time this occurred the pain was significantly worse.  She states that she has been able to bend her elbow but was afraid that she might have broken and came to the ER for evaluation of this.  She denies any other symptoms.  She is taken no medications prior to arrival apart from Tylenol with some improvement in her symptoms.  No head injury, LOC, neck injury, back or body pain besides elbow.     Past Medical History:  Diagnosis Date  . Abnormal colonoscopy 03/2017   decreased rectal tone and polyp; 5 year recall  . Abnormal findings on esophagogastroduodenoscopy (EGD) 02/25/2016   Z-line irrg. at 40 cm. No abnomrality of the esophagus to explain dysphagia. Esophagus dilated. H/o + H. Pylori  . Allergy   . ANXIETY 07/06/2007  . Aortic atherosclerosis (HCC) 02/08/2018  . Aortic valve regurgitation   . Atherosclerosis of native coronary artery of native heart without angina pectoris 02/08/2018  . Biceps tendonitis on right   . Bronchiectasis without complication (HCC) 03/16/2011   CT 2012 IMPRESSION:  1. Apical scarring may account for the plain film abnormality.  2. A 6 mm right upper lobe pulmonary nodule.  - - HRCT 07/12/2017 >>>  Scattered minimal cylindrical and varicoid bronchiectasis with associated scattered mucoid impaction and minimal  tree-in-bud opacity in both lungs, predominantly in the mid to lower lungs. Findings are stable to slightly worsened since 2015 ches  . Chicken pox   . Chronic cough 05/18/2017   CT chest 03/19/14  No bronchiectasis (not hrct)  Spirometry 05/18/2017  FEV1  2.15 (93%)  Ratio 76 s rx prior  - FENO 05/18/2017  =   Could not perform   - Allergy profile 05/18/2017 >  Eos 0.2 /  IgE  75 RAST pos cat > dog > mold - HRCT 07/12/2017 >>>  See bronchiectasis - trial of dymista 07/26/2017 > some better but not consistent with it > rechallenge rx one puff each am 10/25/2017  - MCT 11/02/17 >    . Colon polyp 03/31/2017   hyperplastic  . Diverticulosis 03/2017   sigmoid  . Essential hypertension 12/21/2010  . Family history of breast cancer   . Family history of colon cancer   . Family history of ovarian cancer   . Frequent UTI   . Genetic testing 03/26/2016   Negative genetic testing on the [facility] 30 gene panel.  The 30-Gene Cancer Panel offered by [facility] includes sequencing and/or deletion duplication testing of the following 30 genes: APC, ATM, BAP1, BARD1, BMPR1A, BRCA1, BRCA2, BRIP1, CDH1, CDK4, CDKN2A (p14ARF), CDKN2A (p16INK4a), CHEK2, EPCAM, GREM1, MITF, MLH1, MSH2, MSH6, MUTYH, NBN, PALB2, PMS2, POLD1, POLE, PTEN, RAD51C, RAD51D, SMAD4,   . GERD 11/09/2006  . H. pylori infection  h/o treated wiht prevpac  . Hormone replacement therapy 01/08/2019  . HYPERLIPIDEMIA 11/09/2006  . Hyperlipidemia 11/09/2006   Qualifier: Diagnosis of  By: Leanne Chang MD, Bruce    . Hypertension   . Hypothyroidism 08/03/2017  . Lumbar radiculopathy 07/24/2014  . Migraine   . Mild depression ([place]) 02/14/2019  . Pharyngoesophageal dysphagia 08/03/2017  . Piriformis syndrome of right side   . Spinal stenosis, lumbar region, with neurogenic claudication 10/16/2014   Epidural 10/02/2014, December 2016   . Stomach ulcer 2008    Patient Active Problem List   Diagnosis Date Noted  . Moderate aortic regurgitation 10/17/2019  .  Non-sustained ventricular tachycardia ([place]) 09/04/2019  . Frequent PAC (premature atrial contraction) 09/04/2019  . Moderate mitral regurgitation 09/04/2019  . Rheumatic aortic valve insufficiency 09/04/2019  . Mild depression ([place]) 02/14/2019  . Hormone replacement therapy 01/08/2019  . Aortic atherosclerosis ([place]) 02/08/2018  . Atherosclerosis of native coronary artery of native heart without angina pectoris 02/08/2018  . Hypothyroidism 08/03/2017  . Pharyngoesophageal dysphagia 08/03/2017  . Chronic cough 05/18/2017  . Family history of colon cancer 04/03/2017  . Genetic testing 03/26/2016  . Spinal stenosis, lumbar region, with neurogenic claudication 10/16/2014  . Lumbar radiculopathy 07/24/2014  . Bronchiectasis without complication ([place]) 29/56/2130  . Essential hypertension 12/21/2010  . Hyperlipidemia 11/09/2006  . GERD 11/09/2006    Past Surgical History:  Procedure Laterality Date  . CERVICAL POLYPECTOMY  01/17/2019  . LIPOMA EXCISION     of back  . TUBAL LIGATION       OB History    Gravida  2   Para  2   Term      Preterm      AB      Living  2     SAB      IAB      Ectopic      Multiple      Live Births              Family History  Problem Relation Age of Onset  . Dementia Mother   . Hyperlipidemia Mother   . Colon cancer Father 58  . Colon polyps Brother        gets colonoscopy every 2-3 years  . Hyperlipidemia Brother   . Hypertension Brother   . Uterine cancer Maternal Aunt        dx in her 98s  . Colon cancer Maternal Uncle   . Colon cancer Paternal Aunt   . Heart attack Maternal Grandfather   . Cancer Other        MGMs sister - "abdominal cancer"  . Breast cancer Cousin     Social History   Tobacco Use  . Smoking status: Former Smoker    Packs/day: 2.50    Years: 13.00    Pack years: 32.50    Types: Cigarettes    Quit date: 07/28/1974    Years since quitting: 45.7  . Smokeless tobacco: Never Used  Vaping Use  .  Vaping Use: Never used  Substance Use Topics  . Alcohol use: Yes    Alcohol/week: 2.0 standard drinks    Types: 2 Cans of beer per week  . Drug use: No    Home Medications Prior to Admission medications   Medication Sig Start Date End Date Taking? Authorizing Provider  acebutolol (SECTRAL) 200 MG capsule Take 1 capsule (200 mg total) by mouth daily. 12/31/19   Tobb, Kardie, DO  aspirin EC 81 MG tablet Take 81  mg by mouth daily. Swallow whole.    [provider]  atorvastatin (LIPITOR) 40 MG tablet Take 1 tablet (40 mg total) by mouth daily. 10/17/19   Tobb, Kardie, DO  Azelaic Acid 15 % cream APPLY TO AFFECTED AREA ON FACE UP TO TWO TIMES A DAY AS NEEDED 04/20/18   [provider]  chlorpheniramine (CHLOR-TRIMETON) 4 MG tablet Take 4 mg by mouth every 4 (four) hours as needed for allergies.    [provider]  Ergocalciferol (VITAMIN D2) 2000 units TABS Take 1 tablet by mouth daily.    [provider]  estradiol (VIVELLE-DOT) 0.0375 MG/24HR Place 1 patch onto the skin 2 (two) times a week.    [provider]  gabapentin (NEURONTIN) 100 MG capsule TAKE 2-3 CAPSULES (200-300 MG TOTAL) BY MOUTH AT BEDTIME. 05/02/19   Lyndal Pulley, DO  levothyroxine (SYNTHROID, LEVOTHROID) 88 MCG tablet Take 88 mcg by mouth daily. 02/17/15   [provider]  losartan (COZAAR) 25 MG tablet Take 0.5 tablets (12.5 mg total) by mouth daily. 12/25/19   Kuneff, Renee A, DO  omeprazole (PRILOSEC) 40 MG capsule Take 1 capsule (40 mg total) by mouth daily. Take 30- 60 min before your first meal 12/25/19   Kuneff, Renee A, DO  progesterone (PROMETRIUM) 100 MG capsule Take by mouth.    [provider]  sucralfate (CARAFATE) 1 GM/10ML suspension Take 10 mLs (1 g total) by mouth in the morning, at noon, in the evening, and at bedtime. Every other day 12/25/19   Howard Pouch A, DO  venlafaxine XR (EFFEXOR-XR) 37.5 MG 24 hr capsule TAKE 1 CAPSULE BY MOUTH DAILY WITH  BREAKFAST. 06/06/19   Lyndal Pulley, DO    Allergies    Tetracycline hcl  Review of Systems   Review of Systems  Constitutional: Negative for fever.  HENT: Negative for congestion.   Respiratory: Negative for shortness of breath.   Cardiovascular: Negative for chest pain.  Gastrointestinal: Negative for abdominal distention.  Musculoskeletal:       Left elbow pain  Neurological: Negative for dizziness and headaches.    Physical Exam Updated Vital Signs BP (!) 204/99 (BP Location: Right Arm) Comment: PA at bedside and aware   Pulse (!) 58   Temp 98.5 F (36.9 C) (Tympanic)   Resp 18   Ht 5' 4.25" (1.632 m)   Wt 56.7 kg   SpO2 98%   BMI 21.29 kg/m   Physical Exam Vitals and nursing note reviewed.  Constitutional:      General: She is not in acute distress.    Appearance: Normal appearance. She is not ill-appearing.  HENT:     Head: Normocephalic and atraumatic.  Eyes:     General: No scleral icterus.       Right eye: No discharge.        Left eye: No discharge.     Conjunctiva/sclera: Conjunctivae normal.  Cardiovascular:     Rate and Rhythm: Normal rate.     Comments: Bilateral radial artery pulses 3+ and symmetric Pulmonary:     Effort: Pulmonary effort is normal.     Breath sounds: No stridor.  Musculoskeletal:     Comments: Left medial epicondyles tenderness palpation of the elbow.  She has full range of motion of the elbow.  Some generalized muscular tenderness to palpation of the deltoid and left trapezius no bony tenderness of the C-spine T-spine or L-spine.  No hip chest or lower extremity tenderness to palpation no  obvious abrasions or lacerations.  No evidence of head trauma with no cranial tenderness and no abrasions or bruising or tenderness to the scalp.  There is swelling of the left elbow over the olecranon  Skin:    General: Skin is warm and dry.     Capillary Refill: Capillary refill takes less than 2 seconds.  Neurological:     Mental Status:  She is alert and oriented to person, place, and time. Mental status is at baseline.     ED Results / Procedures / Treatments   Labs (all labs ordered are listed, but only abnormal results are displayed) Labs Reviewed - No data to display  EKG None  Radiology DG Elbow Complete Left  Result Date: 04/15/2020 CLINICAL DATA:  injury EXAM: LEFT ELBOW - COMPLETE 3+ VIEW COMPARISON:  None. FINDINGS: Normal alignment with approximation of the joints. No fracture or focal osseous lesion. No joint effusion. Mild soft tissue swelling overlying the olecranon. IMPRESSION: No acute osseous abnormality.  Mild soft tissue swelling. Electronically Signed   By: Primitivo Gauze M.D.   On: 04/15/2020 13:11   DG Shoulder Left  Result Date: 04/15/2020 CLINICAL DATA:  Left shoulder pain after fall. EXAM: LEFT SHOULDER - 2+ VIEW COMPARISON:  None. FINDINGS: There is no evidence of fracture or dislocation. There is no evidence of arthropathy or other focal bone abnormality. Soft tissues are unremarkable. IMPRESSION: Negative. Electronically Signed   By: Marijo Conception M.D.   On: 04/15/2020 13:11    Procedures Procedures (including critical care time)  Medications Ordered in ED Medications  acetaminophen (TYLENOL) tablet 650 mg (650 mg Oral Not Given 04/15/20 1525)    ED Course  I have reviewed the triage vital signs and the nursing notes.  Pertinent labs & imaging results that were available during my care of the patient were reviewed by me and considered in my medical decision making (see chart for details).    MDM Rules/Calculators/A&P                          Patient is a 75 year old female presented today for mechanical fall on ice.  She fell onto her left elbow and has swelling without bruising laceration or abrasion.  Pain is 6/10 and achy and constant.  Worse with touch and movement.   Physical exam notable for some swelling and tenderness to palpation of the left elbow approximately over  the olecranon she does not have any focal bony tenderness but does have generalized tenderness over the area of swelling there is no abrasion or laceration.  Her head is atraumatic without any evidence of abrasion or laceration to the scalp or neck.  She is no other bony tenderness on my trauma examination.  Left shoulder and left elbow x-ray reviewed by myself.  I agree of radiology read that there is no bony fracture or dislocation.  I suspect patient has significant soft tissue swelling from contusion.  Patient's blood pressure is elevated she has a history of hypertension and is currently in pain.  She states her blood pressure is not usually this high.  She denies any chest pain shortness of breath, headache or any other pain apart from her elbow pain.  No visual symptoms nausea or vomiting.  I discussed this case with my attending physician who cosigned this note including patient's presenting symptoms, physical exam, and planned diagnostics and interventions. Attending physician stated agreement with plan or made changes to plan which  were implemented.   Will discharge patient with strict return precautions and follow-up with her PCP.  She will rest ice and elevate.  Final Clinical Impression(s) / ED Diagnoses Final diagnoses:  Contusion of left elbow, initial encounter  Fall, initial encounter    Rx / DC Orders ED Discharge Orders    None       Tedd Sias, [place] 04/15/20 Domenica Fail, MD 04/17/20 [phone]

## 2020-04-15 NOTE — ED Triage Notes (Signed)
Pt reports that she was walking her dog and fell 3 times on patches of ice. Pt dnies hitting her head, but does report Left elbow and shoulder pain.

## 2020-04-15 NOTE — ED Notes (Signed)
Ice pack given

## 2020-04-23 ENCOUNTER — Ambulatory Visit: Payer: Medicare PPO

## 2020-05-07 ENCOUNTER — Ambulatory Visit (INDEPENDENT_AMBULATORY_CARE_PROVIDER_SITE_OTHER): Payer: Medicare PPO

## 2020-05-07 VITALS — Ht 64.25 in | Wt 125.0 lb

## 2020-05-07 DIAGNOSIS — Z0000 Encounter for general adult medical examination without abnormal findings: Secondary | ICD-10-CM | POA: Diagnosis not present

## 2020-05-07 NOTE — Patient Instructions (Signed)
Yvette Short , Thank you for taking time to complete your Medicare Wellness Visit. I appreciate your ongoing commitment to your health goals. Please review the following plan we discussed and let me know if I can assist you in the future.   Screening recommendations/referrals: Colonoscopy: Completed 03/31/2017-Follow recommendation from GI regarding next colonoscopy. Mammogram: Completed 06/18/2019-Due 06/17/2020 Bone Density: Per our conversation today, you will discuss with GYN. Recommended yearly ophthalmology/optometry visit for glaucoma screening and checkup Recommended yearly dental visit for hygiene and checkup  Vaccinations: Influenza vaccine: Up to date Pneumococcal vaccine: Completed vaccines Tdap vaccine: Up to date-Due-12/29/2026 Shingles vaccine: Completed vaccines  Covid-19:Completed vaccines  Advanced directives: Please bring a copy for your chart  Conditions/risks identified: See problem list  Next appointment: Follow up in one year for your annual wellness visit    Preventive Care 65 Years and Older, Female Preventive care refers to lifestyle choices and visits with your health care provider that can promote health and wellness. What does preventive care include?  A yearly physical exam. This is also called an annual well check.  Dental exams once or twice a year.  Routine eye exams. Ask your health care provider how often you should have your eyes checked.  Personal lifestyle choices, including:  Daily care of your teeth and gums.  Regular physical activity.  Eating a healthy diet.  Avoiding tobacco and drug use.  Limiting alcohol use.  Practicing safe sex.  Taking low-dose aspirin every day.  Taking vitamin and mineral supplements as recommended by your health care provider. What happens during an annual well check? The services and screenings done by your health care provider during your annual well check will depend on your age, overall health,  lifestyle risk factors, and family history of disease. Counseling  Your health care provider may ask you questions about your:  Alcohol use.  Tobacco use.  Drug use.  Emotional well-being.  Home and relationship well-being.  Sexual activity.  Eating habits.  History of falls.  Memory and ability to understand (cognition).  Work and work Statistician.  Reproductive health. Screening  You may have the following tests or measurements:  Height, weight, and BMI.  Blood pressure.  Lipid and cholesterol levels. These may be checked every 5 years, or more frequently if you are over 31 years old.  Skin check.  Lung cancer screening. You may have this screening every year starting at age 9 if you have a 30-pack-year history of smoking and currently smoke or have quit within the past 15 years.  Fecal occult blood test (FOBT) of the stool. You may have this test every year starting at age 32.  Flexible sigmoidoscopy or colonoscopy. You may have a sigmoidoscopy every 5 years or a colonoscopy every 10 years starting at age 17.  Hepatitis C blood test.  Hepatitis B blood test.  Sexually transmitted disease (STD) testing.  Diabetes screening. This is done by checking your blood sugar (glucose) after you have not eaten for a while (fasting). You may have this done every 1-3 years.  Bone density scan. This is done to screen for osteoporosis. You may have this done starting at age 54.  Mammogram. This may be done every 1-2 years. Talk to your health care provider about how often you should have regular mammograms. Talk with your health care provider about your test results, treatment options, and if necessary, the need for more tests. Vaccines  Your health care provider may recommend certain vaccines, such as:  Influenza  vaccine. This is recommended every year.  Tetanus, diphtheria, and acellular pertussis (Tdap, Td) vaccine. You may need a Td booster every 10 years.  Zoster  vaccine. You may need this after age 37.  Pneumococcal 13-valent conjugate (PCV13) vaccine. One dose is recommended after age 6.  Pneumococcal polysaccharide (PPSV23) vaccine. One dose is recommended after age 28. Talk to your health care provider about which screenings and vaccines you need and how often you need them. This information is not intended to replace advice given to you by your health care provider. Make sure you discuss any questions you have with your health care provider. Document Released: 04/11/2015 Document Revised: 12/03/2015 Document Reviewed: 01/14/2015 Elsevier Interactive Patient Education  2017 [place] Prevention in the Home Falls can cause injuries. They can happen to people of all ages. There are many things you can do to make your home safe and to help prevent falls. What can I do on the outside of my home?  Regularly fix the edges of walkways and driveways and fix any cracks.  Remove anything that might make you trip as you walk through a door, such as a raised step or threshold.  Trim any bushes or trees on the path to your home.  Use bright outdoor lighting.  Clear any walking paths of anything that might make someone trip, such as rocks or tools.  Regularly check to see if handrails are loose or broken. Make sure that both sides of any steps have handrails.  Any raised decks and porches should have guardrails on the edges.  Have any leaves, snow, or ice cleared regularly.  Use sand or salt on walking paths during winter.  Clean up any spills in your garage right away. This includes oil or grease spills. What can I do in the bathroom?  Use night lights.  Install grab bars by the toilet and in the tub and shower. Do not use towel bars as grab bars.  Use non-skid mats or decals in the tub or shower.  If you need to sit down in the shower, use a plastic, non-slip stool.  Keep the floor dry. Clean up any water that spills on the  floor as soon as it happens.  Remove soap buildup in the tub or shower regularly.  Attach bath mats securely with double-sided non-slip rug tape.  Do not have throw rugs and other things on the floor that can make you trip. What can I do in the bedroom?  Use night lights.  Make sure that you have a light by your bed that is easy to reach.  Do not use any sheets or blankets that are too big for your bed. They should not hang down onto the floor.  Have a firm chair that has side arms. You can use this for support while you get dressed.  Do not have throw rugs and other things on the floor that can make you trip. What can I do in the kitchen?  Clean up any spills right away.  Avoid walking on wet floors.  Keep items that you use a lot in easy-to-reach places.  If you need to reach something above you, use a strong step stool that has a grab bar.  Keep electrical cords out of the way.  Do not use floor polish or wax that makes floors slippery. If you must use wax, use non-skid floor wax.  Do not have throw rugs and other things on the floor that can  make you trip. What can I do with my stairs?  Do not leave any items on the stairs.  Make sure that there are handrails on both sides of the stairs and use them. Fix handrails that are broken or loose. Make sure that handrails are as long as the stairways.  Check any carpeting to make sure that it is firmly attached to the stairs. Fix any carpet that is loose or worn.  Avoid having throw rugs at the top or bottom of the stairs. If you do have throw rugs, attach them to the floor with carpet tape.  Make sure that you have a light switch at the top of the stairs and the bottom of the stairs. If you do not have them, ask someone to add them for you. What else can I do to help prevent falls?  Wear shoes that:  Do not have high heels.  Have rubber bottoms.  Are comfortable and fit you well.  Are closed at the toe. Do not wear  sandals.  If you use a stepladder:  Make sure that it is fully opened. Do not climb a closed stepladder.  Make sure that both sides of the stepladder are locked into place.  Ask someone to hold it for you, if possible.  Clearly mark and make sure that you can see:  Any grab bars or handrails.  First and last steps.  Where the edge of each step is.  Use tools that help you move around (mobility aids) if they are needed. These include:  Canes.  Walkers.  Scooters.  Crutches.  Turn on the lights when you go into a dark area. Replace any light bulbs as soon as they burn out.  Set up your furniture so you have a clear path. Avoid moving your furniture around.  If any of your floors are uneven, fix them.  If there are any pets around you, be aware of where they are.  Review your medicines with your doctor. Some medicines can make you feel dizzy. This can increase your chance of falling. Ask your doctor what other things that you can do to help prevent falls. This information is not intended to replace advice given to you by your health care provider. Make sure you discuss any questions you have with your health care provider. Document Released: 01/09/2009 Document Revised: 08/21/2015 Document Reviewed: 04/19/2014 Elsevier Interactive Patient Education  2017 [employer].

## 2020-05-07 NOTE — Progress Notes (Signed)
Subjective:   Yvette Short is a 75 y.o. female who presents for an Initial Medicare Annual Wellness Visit.  I connected with Tansy today by telephone and verified that I am speaking with the correct person using two identifiers. Location patient: home Location provider: work Persons participating in the virtual visit: patient, Marine scientist.    I discussed the limitations, risks, security and privacy concerns of performing an evaluation and management service by telephone and the availability of in person appointments. I also discussed with the patient that there may be a patient responsible charge related to this service. The patient expressed understanding and verbally consented to this telephonic visit.    Interactive audio and video telecommunications were attempted between this provider and patient, however failed, due to patient having technical difficulties OR patient did not have access to video capability.  We continued and completed visit with audio only.  Some vital signs may be absent or patient reported.   Time Spent with patient on telephone encounter: 25 minutes   Review of Systems     Cardiac Risk Factors include: advanced age (>59mn, >>53women);hypertension;dyslipidemia     Objective:    Today's Vitals   05/07/20 0904  Weight: 125 lb (56.7 kg)  Height: 5' 4.25" (1.632 m)   Body mass index is 21.29 kg/m.  Advanced Directives 05/07/2020 04/15/2020  Does Patient Have a Medical Advance Directive? Yes No  Type of AParamedicof [place] will -  Copy of [place]? No - copy requested -    Current Medications (verified) Outpatient Encounter Medications as of 05/07/2020  Medication Sig  . acebutolol (SECTRAL) 200 MG capsule Take 1 capsule (200 mg total) by mouth daily.  .Marland Kitchenaspirin EC 81 MG tablet Take 81 mg by mouth daily. Swallow whole.  .Marland Kitchenatorvastatin (LIPITOR) 40 MG tablet Take 1 tablet (40 mg total) by  mouth daily.  . Azelaic Acid 15 % cream APPLY TO AFFECTED AREA ON FACE UP TO TWO TIMES A DAY AS NEEDED  . chlorpheniramine (CHLOR-TRIMETON) 4 MG tablet Take 4 mg by mouth every 4 (four) hours as needed for allergies.  . Ergocalciferol (VITAMIN D2) 2000 units TABS Take 1 tablet by mouth daily.  .Marland Kitchenestradiol (VIVELLE-DOT) 0.0375 MG/24HR Place 1 patch onto the skin 2 (two) times a week.  . gabapentin (NEURONTIN) 100 MG capsule TAKE 2-3 CAPSULES (200-300 MG TOTAL) BY MOUTH AT BEDTIME.  .Marland Kitchenlevothyroxine (SYNTHROID, LEVOTHROID) 88 MCG tablet Take 88 mcg by mouth daily.  .Marland Kitchenlosartan (COZAAR) 25 MG tablet Take 0.5 tablets (12.5 mg total) by mouth daily.  .Marland Kitchenomeprazole (PRILOSEC) 40 MG capsule Take 1 capsule (40 mg total) by mouth daily. Take 30- 60 min before your first meal  . progesterone (PROMETRIUM) 100 MG capsule Take by mouth.  . sucralfate (CARAFATE) 1 GM/10ML suspension Take 10 mLs (1 g total) by mouth in the morning, at noon, in the evening, and at bedtime. Every other day  . venlafaxine XR (EFFEXOR-XR) 37.5 MG 24 hr capsule TAKE 1 CAPSULE BY MOUTH DAILY WITH BREAKFAST.   No facility-administered encounter medications on file as of 05/07/2020.    Allergies (verified) Tetracycline hcl   History: Past Medical History:  Diagnosis Date  . Abnormal colonoscopy 03/2017   decreased rectal tone and polyp; 5 year recall  . Abnormal findings on esophagogastroduodenoscopy (EGD) 02/25/2016   Z-line irrg. at 40 cm. No abnomrality of the esophagus to explain dysphagia. Esophagus dilated. H/o + H.  Pylori  . Allergy   . ANXIETY 07/06/2007  . Aortic atherosclerosis ([place]) 02/08/2018  . Aortic valve regurgitation   . Atherosclerosis of native coronary artery of native heart without angina pectoris 02/08/2018  . Biceps tendonitis on right   . Bronchiectasis without complication ([place]) 33/29/5188   CT 2012 IMPRESSION:  1. Apical scarring may account for the plain film abnormality.  2. A 6 mm right upper lobe  pulmonary nodule.  - - HRCT 07/12/2017 >>>  Scattered minimal cylindrical and varicoid bronchiectasis with associated scattered mucoid impaction and minimal tree-in-bud opacity in both lungs, predominantly in the mid to lower lungs. Findings are stable to slightly worsened since 2015 ches  . Chicken pox   . Chronic cough 05/18/2017   CT chest 03/19/14  No bronchiectasis (not hrct)  Spirometry 05/18/2017  FEV1  2.15 (93%)  Ratio 76 s rx prior  - FENO 05/18/2017  =   Could not perform   - Allergy profile 05/18/2017 >  Eos 0.2 /  IgE  75 RAST pos cat > dog > mold - HRCT 07/12/2017 >>>  See bronchiectasis - trial of dymista 07/26/2017 > some better but not consistent with it > rechallenge rx one puff each am 10/25/2017  - MCT 11/02/17 >    . Colon polyp 03/31/2017   hyperplastic  . Diverticulosis 03/2017   sigmoid  . Essential hypertension 12/21/2010  . Family history of breast cancer   . Family history of colon cancer   . Family history of ovarian cancer   . Frequent UTI   . Genetic testing 03/26/2016   Negative genetic testing on the [facility] 30 gene panel.  The 30-Gene Cancer Panel offered by [facility] includes sequencing and/or deletion duplication testing of the following 30 genes: APC, ATM, BAP1, BARD1, BMPR1A, BRCA1, BRCA2, BRIP1, CDH1, CDK4, CDKN2A (p14ARF), CDKN2A (p16INK4a), CHEK2, EPCAM, GREM1, MITF, MLH1, MSH2, MSH6, MUTYH, NBN, PALB2, PMS2, POLD1, POLE, PTEN, RAD51C, RAD51D, SMAD4,   . GERD 11/09/2006  . H. pylori infection    h/o treated wiht prevpac  . Hormone replacement therapy 01/08/2019  . HYPERLIPIDEMIA 11/09/2006  . Hyperlipidemia 11/09/2006   Qualifier: Diagnosis of  By: Leanne Chang MD, Bruce    . Hypertension   . Hypothyroidism 08/03/2017  . Lumbar radiculopathy 07/24/2014  . Migraine   . Mild depression ([place]) 02/14/2019  . Pharyngoesophageal dysphagia 08/03/2017  . Piriformis syndrome of right side   . Spinal stenosis, lumbar region, with neurogenic claudication 10/16/2014   Epidural  10/02/2014, December 2016   . Stomach ulcer 2008   Past Surgical History:  Procedure Laterality Date  . CERVICAL POLYPECTOMY  01/17/2019  . LIPOMA EXCISION     of back  . TUBAL LIGATION     Family History  Problem Relation Age of Onset  . Dementia Mother   . Hyperlipidemia Mother   . Colon cancer Father 5  . Colon polyps Brother        gets colonoscopy every 2-3 years  . Hyperlipidemia Brother   . Hypertension Brother   . Uterine cancer Maternal Aunt        dx in her 49s  . Colon cancer Maternal Uncle   . Colon cancer Paternal Aunt   . Heart attack Maternal Grandfather   . Cancer Other        MGMs sister - "abdominal cancer"  . Breast cancer Cousin    Social History   Socioeconomic History  . Marital status: Married    Spouse name: Not  on file  . Number of children: Not on file  . Years of education: Not on file  . Highest education level: Not on file  Occupational History  . Not on file  Tobacco Use  . Smoking status: Former Smoker    Packs/day: 2.50    Years: 13.00    Pack years: 32.50    Types: Cigarettes    Quit date: 07/28/1974    Years since quitting: 45.8  . Smokeless tobacco: Never Used  Vaping Use  . Vaping Use: Never used  Substance and Sexual Activity  . Alcohol use: Yes    Alcohol/week: 2.0 standard drinks    Types: 2 Cans of beer per week  . Drug use: No  . Sexual activity: Yes    Partners: Male  Other Topics Concern  . Not on file  Social History Narrative   Married. Two children.    College grad.    Former smoker.    Exercises routinely.    Drink caffeine.    Wears a hearing aid.    Smoke alarm in the home.    Wears her seatbelt.    Feels safe in her relationships.    Social Determinants of Health   Financial Resource Strain: Low Risk   . Difficulty of Paying Living Expenses: Not hard at all  Food Insecurity: No Food Insecurity  . Worried About Charity fundraiser in the Last Year: Never true  . Ran Out of Food in the Last Year:  Never true  Transportation Needs: No Transportation Needs  . Lack of Transportation (Medical): No  . Lack of Transportation (Non-Medical): No  Physical Activity: Sufficiently Active  . Days of Exercise per Week: 7 days  . Minutes of Exercise per Session: 50 min  Stress: No Stress Concern Present  . Feeling of Stress : Not at all  Social Connections: Moderately Integrated  . Frequency of Communication with Friends and Family: More than three times a week  . Frequency of Social Gatherings with Friends and Family: More than three times a week  . Attends Religious Services: 1 to 4 times per year  . Active Member of Clubs or Organizations: No  . Attends Archivist Meetings: Never  . Marital Status: Married    Tobacco Counseling Counseling given: Not Answered   Clinical Intake:  Pre-visit preparation completed: Yes  Pain : No/denies pain     Nutritional Status: BMI of 19-24  Normal Nutritional Risks: None Diabetes: No  How often do you need to have someone help you when you read instructions, pamphlets, or other written materials from your doctor or pharmacy?: 1 - Never  Diabetic?No  Interpreter Needed?: No  Information entered by :: Caroleen Hamman LPN   Activities of Daily Living In your present state of health, do you have any difficulty performing the following activities: 05/07/2020 07/17/2019  Hearing? Y N  Comment hearing aids -  Vision? N N  Difficulty concentrating or making decisions? N N  Walking or climbing stairs? N N  Dressing or bathing? N N  Doing errands, shopping? N N  Preparing Food and eating ? N -  Using the Toilet? N -  In the past six months, have you accidently leaked urine? Y -  Comment occasionally with coughing -  Do you have problems with loss of bowel control? N -  Managing your Medications? N -  Managing your Finances? N -  Housekeeping or managing your Housekeeping? N -  Some recent data  might be hidden    Patient Care  Team: Ma Hillock, DO as PCP - General (Family Medicine) Berniece Salines, DO as PCP - Cardiology (Cardiology) Dian Queen, MD as Consulting Physician (Obstetrics and Gynecology) Tanda Rockers, MD as Consulting Physician (Pulmonary Disease) Lyndal Pulley, DO as Consulting Physician (Sports Medicine) Celedonio Miyamoto, MD as Referring Physician (Gastroenterology) Marica Otter, OD (Optometry)  Indicate any recent Medical Services you may have received from other than [facility] providers in the past year (date may be approximate).     Assessment:   This is a routine wellness examination for [place].  Hearing/Vision screen  Hearing Screening   _0  _1  _2  _3  _4  _5  _6  _7  _8   Right ear:           Left ear:           Comments: Bilateral hearing aids  Vision Screening Comments: Wears glasses Last eye exam-2021-Dr. McCuen  Dietary issues and exercise activities discussed: Current Exercise Habits: Home exercise routine, Type of exercise: Other - see comments;walking;strength training/weights (pilates), Time (Minutes): 50, Frequency (Times/Week): 7, Weekly Exercise (Minutes/Week): 350, Intensity: Moderate, Exercise limited by: None identified  Goals    . Patient Stated     Maintain current health & activity level      Depression Screen PHQ 2/9 Scores 05/07/2020 12/25/2019 02/07/2019 08/03/2017 05/07/2013  PHQ - 2 Score 0 1 1 0 0  PHQ- 9 Score - 4 - - -    Fall Risk Fall Risk  05/07/2020 12/25/2019 07/17/2019 08/03/2017 05/07/2013  Falls in the past year? 1 0 1 No No  Number falls in past yr: 1 0 0 - -  Injury with Fall? 1 0 0 - -  Risk for fall due to : History of fall(s) - - - -  Follow up Falls prevention discussed Falls evaluation completed Falls evaluation completed - -    FALL RISK PREVENTION PERTAINING TO THE HOME:  Any stairs in or around the home? Yes  If so, are there any without handrails? No  Home free of loose throw rugs in walkways, pet beds,  electrical cords, etc? Yes  Adequate lighting in your home to reduce risk of falls? Yes   ASSISTIVE DEVICES UTILIZED TO PREVENT FALLS:  Life alert? No  Use of a cane, walker or w/c? No  Grab bars in the bathroom? No  Shower chair or bench in shower? No  Elevated toilet seat or a handicapped toilet? No   TIMED UP AND GO:  Was the test performed? No . Phone visit   Cognitive Function:Normal cognitive status assessed by  this Nurse Health Advisor. No abnormalities found.          Immunizations Immunization History  Administered Date(s) Administered  . Fluad Quad(high Dose 65+) 12/25/2019  . Influenza Split 12/21/2010, 12/21/2011  . Influenza Whole 01/04/2008, 12/31/2008, 01/14/2010  . Influenza, High Dose Seasonal PF 01/03/2013, 12/26/2013, 02/12/2016, 12/28/2016  . Influenza-Unspecified 12/06/2017  . PFIZER(Purple Top)SARS-COV-2 Vaccination 04/22/2019, 05/14/2019, 01/25/2020  . Pneumococcal Conjugate-13 02/16/2016  . Pneumococcal Polysaccharide-23 04/10/2012  . Td 03/09/2001, 12/10/2008  . Tdap 12/10/2008, 12/28/2016  . Zoster 12/21/2010  . Zoster Recombinat (Shingrix) 08/03/2017, 12/29/2017, 12/29/2017    TDAP status: Up to date  Flu Vaccine status: Up to date  Pneumococcal vaccine status: Up to date  Covid-19 vaccine status: Completed vaccines  Qualifies for Shingles Vaccine? No   Zostavax completed Yes   Shingrix Completed?: Yes  Screening Tests Health Maintenance  Topic Date Due  .  MAMMOGRAM  06/17/2021  . TETANUS/TDAP  12/29/2026  . COLONOSCOPY (Pts 45-26yr Insurance coverage will need to be confirmed)  04/01/2027  . INFLUENZA VACCINE  Completed  . DEXA SCAN  Completed  . COVID-19 Vaccine  Completed  . Hepatitis C Screening  Completed  . PNA vac Low Risk Adult  Completed    Health Maintenance  There are no preventive care reminders to display for this patient.  Colorectal cancer screening: Type of screening: Colonoscopy. Completed 03/31/2017.  Repeat every 10 years  Mammogram status: Completed Bilateral 06/18/2019. Repeat every year  Bone Density status: Patient states she had at GYN office in 2020..Marland KitchenShe plans to discuss with GYN & will have results sent to PCP  Lung Cancer Screening: (Low Dose CT Chest recommended if Age 74-80years, 30 pack-year currently smoking OR have quit w/in 15years.) does not qualify.     Additional Screening:  Hepatitis C Screening:  Completed 03/29/2016  Vision Screening: Recommended annual ophthalmology exams for early detection of glaucoma and other disorders of the eye. Is the patient up to date with their annual eye exam?  Yes  Who is the provider or what is the name of the office in which the patient attends annual eye exams? Dr. MEllie Lunch  Dental Screening: Recommended annual dental exams for proper oral hygiene  Community Resource Referral / Chronic Care Management: CRR required this visit?  No   CCM required this visit?  No      Plan:     I have personally reviewed and noted the following in the patient's chart:   . Medical and social history . Use of alcohol, tobacco or illicit drugs  . Current medications and supplements . Functional ability and status . Nutritional status . Physical activity . Advanced directives . List of other physicians . Hospitalizations, surgeries, and ER visits in previous 12 months . Vitals . Screenings to include cognitive, depression, and falls . Referrals and appointments  In addition, I have reviewed and discussed with patient certain preventive protocols, quality metrics, and best practice recommendations. A written personalized care plan for preventive services as well as general preventive health recommendations were provided to patient.   Due to this being a telephonic visit, the after visit summary with patients personalized plan was offered to patient via mail or my-chart.  Patient would like to access on my-chart.   MMarta Antu  LPN   20/0/7121 Nurse Health Advisor  Nurse Notes: None

## 2020-05-12 DIAGNOSIS — H2513 Age-related nuclear cataract, bilateral: Secondary | ICD-10-CM | POA: Diagnosis not present

## 2020-05-12 DIAGNOSIS — H5213 Myopia, bilateral: Secondary | ICD-10-CM | POA: Diagnosis not present

## 2020-05-16 ENCOUNTER — Other Ambulatory Visit: Payer: Self-pay | Admitting: Obstetrics and Gynecology

## 2020-05-16 DIAGNOSIS — Z1231 Encounter for screening mammogram for malignant neoplasm of breast: Secondary | ICD-10-CM

## 2020-05-25 ENCOUNTER — Other Ambulatory Visit: Payer: Self-pay | Admitting: Family Medicine

## 2020-05-27 NOTE — Telephone Encounter (Signed)
Sent patient message to see if PCP will refill medication.

## 2020-05-28 DIAGNOSIS — B019 Varicella without complication: Secondary | ICD-10-CM | POA: Insufficient documentation

## 2020-05-28 DIAGNOSIS — I351 Nonrheumatic aortic (valve) insufficiency: Secondary | ICD-10-CM | POA: Insufficient documentation

## 2020-05-28 DIAGNOSIS — M7521 Bicipital tendinitis, right shoulder: Secondary | ICD-10-CM | POA: Insufficient documentation

## 2020-05-28 DIAGNOSIS — Z803 Family history of malignant neoplasm of breast: Secondary | ICD-10-CM | POA: Insufficient documentation

## 2020-05-28 DIAGNOSIS — Z8041 Family history of malignant neoplasm of ovary: Secondary | ICD-10-CM | POA: Insufficient documentation

## 2020-05-28 DIAGNOSIS — N390 Urinary tract infection, site not specified: Secondary | ICD-10-CM | POA: Insufficient documentation

## 2020-05-28 DIAGNOSIS — A048 Other specified bacterial intestinal infections: Secondary | ICD-10-CM | POA: Insufficient documentation

## 2020-05-28 DIAGNOSIS — T7840XA Allergy, unspecified, initial encounter: Secondary | ICD-10-CM | POA: Insufficient documentation

## 2020-05-28 DIAGNOSIS — I10 Essential (primary) hypertension: Secondary | ICD-10-CM | POA: Insufficient documentation

## 2020-05-28 DIAGNOSIS — G5701 Lesion of sciatic nerve, right lower limb: Secondary | ICD-10-CM | POA: Insufficient documentation

## 2020-05-29 ENCOUNTER — Other Ambulatory Visit: Payer: Self-pay | Admitting: Family Medicine

## 2020-05-29 NOTE — Telephone Encounter (Signed)
Spoke with pt who stated she has enough losartan until upcoming appt.

## 2020-06-02 ENCOUNTER — Other Ambulatory Visit: Payer: Self-pay | Admitting: Family Medicine

## 2020-06-03 ENCOUNTER — Encounter: Payer: Self-pay | Admitting: Cardiology

## 2020-06-03 ENCOUNTER — Ambulatory Visit (INDEPENDENT_AMBULATORY_CARE_PROVIDER_SITE_OTHER): Payer: Medicare PPO

## 2020-06-03 ENCOUNTER — Ambulatory Visit: Payer: Medicare PPO | Admitting: Cardiology

## 2020-06-03 ENCOUNTER — Other Ambulatory Visit: Payer: Self-pay

## 2020-06-03 VITALS — BP 140/84 | HR 60 | Ht 65.0 in | Wt 128.0 lb

## 2020-06-03 DIAGNOSIS — R42 Dizziness and giddiness: Secondary | ICD-10-CM | POA: Diagnosis not present

## 2020-06-03 DIAGNOSIS — E782 Mixed hyperlipidemia: Secondary | ICD-10-CM | POA: Diagnosis not present

## 2020-06-03 DIAGNOSIS — I491 Atrial premature depolarization: Secondary | ICD-10-CM

## 2020-06-03 DIAGNOSIS — I340 Nonrheumatic mitral (valve) insufficiency: Secondary | ICD-10-CM

## 2020-06-03 DIAGNOSIS — I2510 Atherosclerotic heart disease of native coronary artery without angina pectoris: Secondary | ICD-10-CM | POA: Diagnosis not present

## 2020-06-03 DIAGNOSIS — I351 Nonrheumatic aortic (valve) insufficiency: Secondary | ICD-10-CM | POA: Diagnosis not present

## 2020-06-03 DIAGNOSIS — R002 Palpitations: Secondary | ICD-10-CM

## 2020-06-03 NOTE — Progress Notes (Signed)
Cardiology Office Note:    Date:  06/03/2020   ID:  Yvette Short, DOB 03/28/1946, MRN [id number]  PCP:  Ma Hillock, DO  Cardiologist:  Berniece Salines, DO  Electrophysiologist:  None   Referring MD: Ma Hillock, DO   I feel like I am gaining a lot of weight  History of Present Illness:    Yvette Short is a 75 y.o. female with a hx of hypertension, hyperlipidemia, hypothyroidism, moderate coronary artery disease by coronary CTA, frequent PACs and PVCs, moderate mitral regurgitation, moderate aortic regurgitation presents today for follow-up visit.  I  saw the patient on September 04, 2019 at that time we discussed her monitor results which showed frequent PACs as well as PVCs. I did start the patient on acebutolol 200 mg twice daily. That day she also reported that she was experiencing chest pain we ordered coronary CTA which she was able to get in the interim showed moderate coronary artery disease.  I did see the patient in July 2021 at that time she was reporting significant fatigue and her blood pressure was running between 90 systolic to 619 systolic.  I did not stop her amlodipine.  In addition giving her CTA showing coronary artery disease I increased her Lipitor to 40 mg daily.  I saw the patient in October 2021 at that time we will decrease her acebutolol to 200 mg daily.     Today she tells me that she has been experiencing some lightheadedness and dizziness.  She has gained some weight 5 pounds since the last time I saw her she is not happy about this.  She had questions about her medications including acebutolol we discussed this.  She notes that intermittently she feels dizzy when she goes to her exercise classes and she did have an episode where she felt woozy and fell recently.  No chest pain, no shortness of breath  Past Medical History:  Diagnosis Date  . Abnormal colonoscopy 03/2017   decreased rectal tone and polyp; 5 year recall  . Abnormal findings on  esophagogastroduodenoscopy (EGD) 02/25/2016   Z-line irrg. at 40 cm. No abnomrality of the esophagus to explain dysphagia. Esophagus dilated. H/o + H. Pylori  . Allergy   . ANXIETY 07/06/2007  . Aortic atherosclerosis ([place]) 02/08/2018  . Aortic valve regurgitation   . Atherosclerosis of native coronary artery of native heart without angina pectoris 02/08/2018  . Biceps tendonitis on right   . Bronchiectasis without complication ([place]) 50/93/2671   CT 2012 IMPRESSION:  1. Apical scarring may account for the plain film abnormality.  2. A 6 mm right upper lobe pulmonary nodule.  - - HRCT 07/12/2017 >>>  Scattered minimal cylindrical and varicoid bronchiectasis with associated scattered mucoid impaction and minimal tree-in-bud opacity in both lungs, predominantly in the mid to lower lungs. Findings are stable to slightly worsened since 2015 ches  . Chicken pox   . Chronic cough 05/18/2017   CT chest 03/19/14  No bronchiectasis (not hrct)  Spirometry 05/18/2017  FEV1  2.15 (93%)  Ratio 76 s rx prior  - FENO 05/18/2017  =   Could not perform   - Allergy profile 05/18/2017 >  Eos 0.2 /  IgE  75 RAST pos cat > dog > mold - HRCT 07/12/2017 >>>  See bronchiectasis - trial of dymista 07/26/2017 > some better but not consistent with it > rechallenge rx one puff each am 10/25/2017  - MCT 11/02/17 >    . Colon  polyp 03/31/2017   hyperplastic  . Diverticulosis 03/2017   sigmoid  . Essential hypertension 12/21/2010  . Family history of breast cancer   . Family history of colon cancer   . Family history of ovarian cancer   . Frequent UTI   . Genetic testing 03/26/2016   Negative genetic testing on the [facility] 30 gene panel.  The 30-Gene Cancer Panel offered by [facility] includes sequencing and/or deletion duplication testing of the following 30 genes: APC, ATM, BAP1, BARD1, BMPR1A, BRCA1, BRCA2, BRIP1, CDH1, CDK4, CDKN2A (p14ARF), CDKN2A (p16INK4a), CHEK2, EPCAM, GREM1, MITF, MLH1, MSH2, MSH6, MUTYH, NBN, PALB2, PMS2,  POLD1, POLE, PTEN, RAD51C, RAD51D, SMAD4,   . GERD 11/09/2006  . H. pylori infection    h/o treated wiht prevpac  . Hormone replacement therapy 01/08/2019  . HYPERLIPIDEMIA 11/09/2006  . Hyperlipidemia 11/09/2006   Qualifier: Diagnosis of  By: Leanne Chang MD, Bruce    . Hypertension   . Hypothyroidism 08/03/2017  . Lumbar radiculopathy 07/24/2014  . Migraine   . Mild depression ([place]) 02/14/2019  . Pharyngoesophageal dysphagia 08/03/2017  . Piriformis syndrome of right side   . Spinal stenosis, lumbar region, with neurogenic claudication 10/16/2014   Epidural 10/02/2014, December 2016   . Stomach ulcer 2008    Past Surgical History:  Procedure Laterality Date  . CERVICAL POLYPECTOMY  01/17/2019  . LIPOMA EXCISION     of back  . TUBAL LIGATION      Current Medications: Current Meds  Medication Sig  . acebutolol (SECTRAL) 200 MG capsule Take 1 capsule (200 mg total) by mouth daily.  Marland Kitchen aspirin EC 81 MG tablet Take 81 mg by mouth daily. Swallow whole.  Marland Kitchen atorvastatin (LIPITOR) 40 MG tablet Take 1 tablet (40 mg total) by mouth daily.  . Azelaic Acid 15 % cream APPLY TO AFFECTED AREA ON FACE UP TO TWO TIMES A DAY AS NEEDED  . chlorpheniramine (CHLOR-TRIMETON) 4 MG tablet Take 4 mg by mouth every 4 (four) hours as needed for allergies.  . Ergocalciferol (VITAMIN D2) 2000 units TABS Take 1 tablet by mouth daily.  Marland Kitchen estradiol (VIVELLE-DOT) 0.0375 MG/24HR Place 1 patch onto the skin 2 (two) times a week.  . gabapentin (NEURONTIN) 100 MG capsule TAKE 2-3 CAPSULES (200-300 MG TOTAL) BY MOUTH AT BEDTIME.  Marland Kitchen levothyroxine (SYNTHROID, LEVOTHROID) 88 MCG tablet Take 88 mcg by mouth daily.  Marland Kitchen losartan (COZAAR) 25 MG tablet Take 0.5 tablets (12.5 mg total) by mouth daily.  Marland Kitchen omeprazole (PRILOSEC) 40 MG capsule Take 1 capsule (40 mg total) by mouth daily. Take 30- 60 min before your first meal  . progesterone (PROMETRIUM) 100 MG capsule Take by mouth.  . sucralfate (CARAFATE) 1 GM/10ML suspension Take 10  mLs (1 g total) by mouth in the morning, at noon, in the evening, and at bedtime. Every other day  . venlafaxine XR (EFFEXOR-XR) 37.5 MG 24 hr capsule TAKE 1 CAPSULE BY MOUTH DAILY WITH BREAKFAST.     Allergies:   Tetracycline hcl   Social History   Socioeconomic History  . Marital status: Married    Spouse name: Not on file  . Number of children: Not on file  . Years of education: Not on file  . Highest education level: Not on file  Occupational History  . Not on file  Tobacco Use  . Smoking status: Former Smoker    Packs/day: 2.50    Years: 13.00    Pack years: 32.50    Types: Cigarettes  Quit date: 07/28/1974    Years since quitting: 45.8  . Smokeless tobacco: Never Used  Vaping Use  . Vaping Use: Never used  Substance and Sexual Activity  . Alcohol use: Yes    Alcohol/week: 2.0 standard drinks    Types: 2 Cans of beer per week  . Drug use: No  . Sexual activity: Yes    Partners: Male  Other Topics Concern  . Not on file  Social History Narrative   Married. Two children.    College grad.    Former smoker.    Exercises routinely.    Drink caffeine.    Wears a hearing aid.    Smoke alarm in the home.    Wears her seatbelt.    Feels safe in her relationships.    Social Determinants of Health   Financial Resource Strain: Low Risk   . Difficulty of Paying Living Expenses: Not hard at all  Food Insecurity: No Food Insecurity  . Worried About Charity fundraiser in the Last Year: Never true  . Ran Out of Food in the Last Year: Never true  Transportation Needs: No Transportation Needs  . Lack of Transportation (Medical): No  . Lack of Transportation (Non-Medical): No  Physical Activity: Sufficiently Active  . Days of Exercise per Week: 7 days  . Minutes of Exercise per Session: 50 min  Stress: No Stress Concern Present  . Feeling of Stress : Not at all  Social Connections: Moderately Integrated  . Frequency of Communication with Friends and Family: More than  three times a week  . Frequency of Social Gatherings with Friends and Family: More than three times a week  . Attends Religious Services: 1 to 4 times per year  . Active Member of Clubs or Organizations: No  . Attends Archivist Meetings: Never  . Marital Status: Married     Family History: The patient's family history includes Breast cancer in her cousin; Cancer in an other family member; Colon cancer in her maternal uncle and paternal aunt; Colon cancer (age of onset: 56) in her father; Colon polyps in her brother; Dementia in her mother; Heart attack in her maternal grandfather; Hyperlipidemia in her brother and mother; Hypertension in her brother; Uterine cancer in her maternal aunt.  ROS:   Review of Systems  Constitution: Negative for decreased appetite, fever and weight gain.  HENT: Negative for congestion, ear discharge, hoarse voice and sore throat.   Eyes: Negative for discharge, redness, vision loss in right eye and visual halos.  Cardiovascular: Negative for chest pain, dyspnea on exertion, leg swelling, orthopnea and palpitations.  Respiratory: Negative for cough, hemoptysis, shortness of breath and snoring.   Endocrine: Negative for heat intolerance and polyphagia.  Hematologic/Lymphatic: Negative for bleeding problem. Does not bruise/bleed easily.  Skin: Negative for flushing, nail changes, rash and suspicious lesions.  Musculoskeletal: Negative for arthritis, joint pain, muscle cramps, myalgias, neck pain and stiffness.  Gastrointestinal: Negative for abdominal pain, bowel incontinence, diarrhea and excessive appetite.  Genitourinary: Negative for decreased libido, genital sores and incomplete emptying.  Neurological: Negative for brief paralysis, focal weakness, headaches and loss of balance.  Psychiatric/Behavioral: Negative for altered mental status, depression and suicidal ideas.  Allergic/Immunologic: Negative for HIV exposure and persistent infections.     EKGs/Labs/Other Studies Reviewed:    The following studies were reviewed today:   EKG: None today  Coronary CTA -IMPRESSION: September 27, 2019 1. Coronary calcium score of 132. This was 42 percentile for age  and sex matched control.  2. Normal coronary origin with right dominance.  3. Moderate Coronary Artery Disease. CADRADS 3.  Zio monitorConclusion: This study is markable for the following: Aug 16, 2019 1. Nonsustained ventricular tachycardia ( 17 episodes). 2. 489 runs of Supraventricular Tachycardia which is likely atrial tachycardia with variable block.  3. Symptomatic Frequent Premature atrial complexes (8.6%, [id number]). 4. Symptomatic Occasional premature ventricular complexes (1.6%, 22319).   EchoIMPRESSIONS July 23, 2019 1. Left ventricular ejection fraction, by estimation, is 60 to 65%. The left ventricle has normal function. The left ventricle has no regional wall motion abnormalities. Left ventricular diastolic parameters are indeterminate.  2. Right ventricular systolic function is normal. The right ventricular size is normal. There is mildly elevated pulmonary artery systolic pressure.  3. Left atrial size was mildly dilated.  4. The mitral valve is normal in structure. Moderate mitral valve regurgitation. No evidence of mitral stenosis.  5. Tricuspid valve regurgitation is mild to moderate.  6. The aortic valve is normal in structure. Aortic valve regurgitation is mild to moderate. No aortic stenosis is present.  7. The inferior vena cava is normal in size with greater than 50% respiratory variability, suggesting right atrial pressure of 3 mmHg.   Recent Labs: 09/20/2019: BUN 16; Creatinine, Ser 0.99; Potassium 4.7; Sodium 141 12/25/2019: Hemoglobin 13.6; Platelets 207.0  Recent Lipid Panel    Component Value Date/Time   CHOL 148  12/25/2019 0911   TRIG 84.0 12/25/2019 0911   HDL 67.90 12/25/2019 0911   CHOLHDL 2 12/25/2019 0911   VLDL 16.8 12/25/2019 0911   LDLCALC 63 12/25/2019 0911   LDLDIRECT 158.2 05/07/2013 0928    Physical Exam:    VS:  BP 140/84   Pulse 60   Ht '5\' 5"'  (1.651 m)   Wt 128 lb (58.1 kg)   SpO2 98%   BMI 21.30 kg/m     Wt Readings from Last 3 Encounters:  06/03/20 128 lb (58.1 kg)  05/07/20 125 lb (56.7 kg)  04/15/20 125 lb (56.7 kg)     GEN: Well nourished, well developed in no acute distress HEENT: Normal NECK: No JVD; No carotid bruits LYMPHATICS: No lymphadenopathy CARDIAC: S1S2 noted,RRR, no murmurs, rubs, gallops RESPIRATORY:  Clear to auscultation without rales, wheezing or rhonchi  ABDOMEN: Soft, non-tender, non-distended, +bowel sounds, no guarding. EXTREMITIES: No edema, No cyanosis, no clubbing MUSCULOSKELETAL:  No deformity  SKIN: Warm and dry NEUROLOGIC:  Alert and oriented x 3, non-focal PSYCHIATRIC:  Normal affect, good insight  ASSESSMENT:    1. Palpitations   2. PAC (premature atrial contraction)   3. Dizziness   4. Mixed hyperlipidemia   5. Coronary artery disease involving native coronary artery of native heart without angina pectoris   6. Moderate mitral regurgitation   7. Moderate aortic regurgitation    PLAN:     Also monitor her blood pressure manually in the office today by myself and standing sitting 140/5890 mercury heart rate of 62, standing 126/62 mmHg heart rate of 68.  Blood pressure is dropping she was not symptomatic there is concern for orthostatic hypotension but for now advised the patient to use an abdominal binder when she is exercising to help her with her blood flow.  In addition given the fact that she is dizzy on acebutolol we will like to do splicer monitoring the patient to see if this medication is causing more lowering of her heart rate and if she is going down or having any pauses.  Should she  have any pulses AV block that is  worsening we will take her off the acebutolol.  If she does not have any significant pauses in AV blocks with the acebutolol I plan to send the patient to [facility] for referral for dizziness.  We discussed she is currently holding her atorvastatin because she thinks that this medication is causing her nose to run.   The patient is in agreement with the above plan. The patient left the office in stable condition.  The patient will follow up in 3 months or sooner if needed.   Medication Adjustments/Labs and Tests Ordered: Current medicines are reviewed at length with the patient today.  Concerns regarding medicines are outlined above.  Orders Placed This Encounter  Procedures  . LONG TERM MONITOR (3-14 DAYS)   No orders of the defined types were placed in this encounter.   Patient Instructions  Medication Instructions:  Your physician recommends that you continue on your current medications as directed. Please refer to the Current Medication list given to you today.  *If you need a refill on your cardiac medications before your next appointment, please call your pharmacy*   Lab Work: None If you have labs (blood work) drawn today and your tests are completely normal, you will receive your results only by: Marland Kitchen MyChart Message (if you have MyChart) OR . A paper copy in the mail If you have any lab test that is abnormal or we need to change your treatment, we will call you to review the results.   Testing/Procedures: A zio monitor was ordered today. It will remain on for 3 days. You will then return monitor and event diary in provided box. It takes 1-2 weeks for report to be downloaded and returned to [place]. We will call you with the results. If monitor falls off or has orange flashing light, please call Zio for further instructions.    Follow-Up: At [facility], you and your health needs are our priority.  As part of our continuing mission to provide you with exceptional heart care, we  have created designated Provider Care Teams.  These Care Teams include your primary Cardiologist (physician) and Advanced Practice Providers (APPs -  Physician Assistants and Nurse Practitioners) who all work together to provide you with the care you need, when you need it.  We recommend signing up for the patient portal called "MyChart".  Sign up information is provided on this After Visit Summary.  MyChart is used to connect with patients for Virtual Visits (Telemedicine).  Patients are able to view lab/test results, encounter notes, upcoming appointments, etc.  Non-urgent messages can be sent to your provider as well.   To learn more about what you can do with MyChart, go to [email or web address].    Your next appointment:   3 month(s)  The format for your next appointment:   In Person  Provider:   Berniece Salines, DO   Other Instructions Use abdominal binder to help with orthostatic hypotension.     Adopting a Healthy Lifestyle.  Know what a healthy weight is for you (roughly BMI <25) and aim to maintain this   Aim for 7+ servings of fruits and vegetables daily   65-80+ fluid ounces of water or unsweet tea for healthy kidneys   Limit to max 1 drink of alcohol per day; avoid smoking/tobacco   Limit animal fats in diet for cholesterol and heart health - choose grass fed whenever available   Avoid highly processed foods, and foods high in  saturated/trans fats   Aim for low stress - take time to unwind and care for your mental health   Aim for 150 min of moderate intensity exercise weekly for heart health, and weights twice weekly for bone health   Aim for 7-9 hours of sleep daily   When it comes to diets, agreement about the perfect plan isnt easy to find, even among the experts. Experts at the [employer] developed an idea known as the Healthy Eating Plate. Just imagine a plate divided into logical, healthy portions.   The emphasis is on diet quality:    Load up on vegetables and fruits - one-half of your plate: Aim for color and variety, and remember that potatoes dont count.   Go for whole grains - one-quarter of your plate: Whole wheat, barley, wheat berries, quinoa, oats, brown rice, and foods made with them. If you want pasta, go with whole wheat pasta.   Protein power - one-quarter of your plate: Fish, chicken, beans, and nuts are all healthy, versatile protein sources. Limit red meat.   The diet, however, does go beyond the plate, offering a few other suggestions.   Use healthy plant oils, such as olive, canola, soy, corn, sunflower and peanut. Check the labels, and avoid partially hydrogenated oil, which have unhealthy trans fats.   If youre thirsty, drink water. Coffee and tea are good in moderation, but skip sugary drinks and limit milk and dairy products to one or two daily servings.   The type of carbohydrate in the diet is more important than the amount. Some sources of carbohydrates, such as vegetables, fruits, whole grains, and beans-are healthier than others.   Finally, stay active  Signed, Berniece Salines, DO  06/03/2020 9:52 AM    [place]

## 2020-06-03 NOTE — Patient Instructions (Addendum)
Medication Instructions:  Your physician recommends that you continue on your current medications as directed. Please refer to the Current Medication list given to you today.  *If you need a refill on your cardiac medications before your next appointment, please call your pharmacy*   Lab Work: None If you have labs (blood work) drawn today and your tests are completely normal, you will receive your results only by: Marland Kitchen MyChart Message (if you have MyChart) OR . A paper copy in the mail If you have any lab test that is abnormal or we need to change your treatment, we will call you to review the results.   Testing/Procedures: A zio monitor was ordered today. It will remain on for 3 days. You will then return monitor and event diary in provided box. It takes 1-2 weeks for report to be downloaded and returned to [place]. We will call you with the results. If monitor falls off or has orange flashing light, please call Zio for further instructions.    Follow-Up: At [facility], you and your health needs are our priority.  As part of our continuing mission to provide you with exceptional heart care, we have created designated Provider Care Teams.  These Care Teams include your primary Cardiologist (physician) and Advanced Practice Providers (APPs -  Physician Assistants and Nurse Practitioners) who all work together to provide you with the care you need, when you need it.  We recommend signing up for the patient portal called "MyChart".  Sign up information is provided on this After Visit Summary.  MyChart is used to connect with patients for Virtual Visits (Telemedicine).  Patients are able to view lab/test results, encounter notes, upcoming appointments, etc.  Non-urgent messages can be sent to your provider as well.   To learn more about what you can do with MyChart, go to [email or web address].    Your next appointment:   3 month(s)  The format for your next appointment:   In  Person  Provider:   Berniece Salines, DO   Other Instructions Use abdominal binder to help with orthostatic hypotension.

## 2020-06-10 DIAGNOSIS — R002 Palpitations: Secondary | ICD-10-CM | POA: Diagnosis not present

## 2020-06-10 DIAGNOSIS — I491 Atrial premature depolarization: Secondary | ICD-10-CM | POA: Diagnosis not present

## 2020-06-26 ENCOUNTER — Ambulatory Visit (INDEPENDENT_AMBULATORY_CARE_PROVIDER_SITE_OTHER): Payer: Medicare PPO | Admitting: Family Medicine

## 2020-06-26 ENCOUNTER — Other Ambulatory Visit: Payer: Self-pay

## 2020-06-26 ENCOUNTER — Encounter: Payer: Self-pay | Admitting: Family Medicine

## 2020-06-26 VITALS — BP 100/64 | HR 66 | Temp 98.1°F | Ht 64.0 in | Wt 127.0 lb

## 2020-06-26 DIAGNOSIS — K219 Gastro-esophageal reflux disease without esophagitis: Secondary | ICD-10-CM | POA: Diagnosis not present

## 2020-06-26 DIAGNOSIS — I700 Atherosclerosis of aorta: Secondary | ICD-10-CM | POA: Diagnosis not present

## 2020-06-26 DIAGNOSIS — I10 Essential (primary) hypertension: Secondary | ICD-10-CM | POA: Diagnosis not present

## 2020-06-26 DIAGNOSIS — I4729 Other ventricular tachycardia: Secondary | ICD-10-CM

## 2020-06-26 DIAGNOSIS — J479 Bronchiectasis, uncomplicated: Secondary | ICD-10-CM | POA: Diagnosis not present

## 2020-06-26 DIAGNOSIS — I2510 Atherosclerotic heart disease of native coronary artery without angina pectoris: Secondary | ICD-10-CM | POA: Diagnosis not present

## 2020-06-26 DIAGNOSIS — K259 Gastric ulcer, unspecified as acute or chronic, without hemorrhage or perforation: Secondary | ICD-10-CM | POA: Diagnosis not present

## 2020-06-26 DIAGNOSIS — A048 Other specified bacterial intestinal infections: Secondary | ICD-10-CM

## 2020-06-26 DIAGNOSIS — Z131 Encounter for screening for diabetes mellitus: Secondary | ICD-10-CM | POA: Diagnosis not present

## 2020-06-26 DIAGNOSIS — E782 Mixed hyperlipidemia: Secondary | ICD-10-CM

## 2020-06-26 DIAGNOSIS — E039 Hypothyroidism, unspecified: Secondary | ICD-10-CM

## 2020-06-26 DIAGNOSIS — R053 Chronic cough: Secondary | ICD-10-CM

## 2020-06-26 DIAGNOSIS — Z0000 Encounter for general adult medical examination without abnormal findings: Secondary | ICD-10-CM | POA: Diagnosis not present

## 2020-06-26 DIAGNOSIS — I472 Ventricular tachycardia: Secondary | ICD-10-CM

## 2020-06-26 LAB — CBC WITH DIFFERENTIAL/PLATELET
Basophils Absolute: 0.1 10*3/uL (ref 0.0–0.1)
Basophils Relative: 0.9 % (ref 0.0–3.0)
Eosinophils Absolute: 0.3 10*3/uL (ref 0.0–0.7)
Eosinophils Relative: 3 % (ref 0.0–5.0)
HCT: 41 % (ref 36.0–46.0)
Hemoglobin: 13.8 g/dL (ref 12.0–15.0)
Lymphocytes Relative: 24.6 % (ref 12.0–46.0)
Lymphs Abs: 2.1 10*3/uL (ref 0.7–4.0)
MCHC: 33.7 g/dL (ref 30.0–36.0)
MCV: 86.6 fl (ref 78.0–100.0)
Monocytes Absolute: 0.6 10*3/uL (ref 0.1–1.0)
Monocytes Relative: 6.8 % (ref 3.0–12.0)
Neutro Abs: 5.5 10*3/uL (ref 1.4–7.7)
Neutrophils Relative %: 64.7 % (ref 43.0–77.0)
Platelets: 208 10*3/uL (ref 150.0–400.0)
RBC: 4.73 Mil/uL (ref 3.87–5.11)
RDW: 13.1 % (ref 11.5–15.5)
WBC: 8.4 10*3/uL (ref 4.0–10.5)

## 2020-06-26 LAB — COMPREHENSIVE METABOLIC PANEL
ALT: 17 U/L (ref 0–35)
AST: 18 U/L (ref 0–37)
Albumin: 4.3 g/dL (ref 3.5–5.2)
Alkaline Phosphatase: 72 U/L (ref 39–117)
BUN: 17 mg/dL (ref 6–23)
CO2: 29 mEq/L (ref 19–32)
Calcium: 9.3 mg/dL (ref 8.4–10.5)
Chloride: 106 mEq/L (ref 96–112)
Creatinine, Ser: 1.01 mg/dL (ref 0.40–1.20)
GFR: 55.16 mL/min — ABNORMAL LOW (ref >60.00)
Glucose, Bld: 80 mg/dL (ref 70–99)
Potassium: 4.6 mEq/L (ref 3.5–5.1)
Sodium: 142 mEq/L (ref 135–145)
Total Bilirubin: 0.6 mg/dL (ref 0.2–1.2)
Total Protein: 6.4 g/dL (ref 6.0–8.3)

## 2020-06-26 LAB — LIPID PANEL
Cholesterol: 147 mg/dL (ref 0–200)
HDL: 66.3 mg/dL (ref >39.00)
LDL Cholesterol: 62 mg/dL (ref 0–99)
NonHDL: 81.13
Total CHOL/HDL Ratio: 2
Triglycerides: 97 mg/dL (ref 0.0–149.0)
VLDL: 19.4 mg/dL (ref 0.0–40.0)

## 2020-06-26 LAB — TSH: TSH: 2.82 u[IU]/mL (ref 0.35–4.50)

## 2020-06-26 LAB — HEMOGLOBIN A1C: Hgb A1c MFr Bld: 5.6 % (ref 4.6–6.5)

## 2020-06-26 MED ORDER — LOSARTAN POTASSIUM 25 MG PO TABS: 12.5000 mg | ORAL_TABLET | Freq: Every day | ORAL | 1 refills | Status: DC

## 2020-06-26 MED ORDER — OMEPRAZOLE 40 MG PO CPDR: 40.0000 mg | DELAYED_RELEASE_CAPSULE | Freq: Every day | ORAL | 1 refills | Status: DC

## 2020-06-26 NOTE — Patient Instructions (Signed)
Health Maintenance After Age 74 After age 74, you are at a higher risk for certain long-term diseases and infections as well as injuries from falls. Falls are a major cause of broken bones and head injuries in people who are older than age 74. Getting regular preventive care can help to keep you healthy and well. Preventive care includes getting regular testing and making lifestyle changes as recommended by your health care provider. Talk with your health care provider about:  Which screenings and tests you should have. A screening is a test that checks for a disease when you have no symptoms.  A diet and exercise plan that is right for you. What should I know about screenings and tests to prevent falls? Screening and testing are the best ways to find a health problem early. Early diagnosis and treatment give you the best chance of managing medical conditions that are common after age 74. Certain conditions and lifestyle choices may make you more likely to have a fall. Your health care provider may recommend:  Regular vision checks. Poor vision and conditions such as cataracts can make you more likely to have a fall. If you wear glasses, make sure to get your prescription updated if your vision changes.  Medicine review. Work with your health care provider to regularly review all of the medicines you are taking, including over-the-counter medicines. Ask your health care provider about any side effects that may make you more likely to have a fall. Tell your health care provider if any medicines that you take make you feel dizzy or sleepy.  Osteoporosis screening. Osteoporosis is a condition that causes the bones to get weaker. This can make the bones weak and cause them to break more easily.  Blood pressure screening. Blood pressure changes and medicines to control blood pressure can make you feel dizzy.  Strength and balance checks. Your health care provider may recommend certain tests to check your  strength and balance while standing, walking, or changing positions.  Foot health exam. Foot pain and numbness, as well as not wearing proper footwear, can make you more likely to have a fall.  Depression screening. You may be more likely to have a fall if you have a fear of falling, feel emotionally low, or feel unable to do activities that you used to do.  Alcohol use screening. Using too much alcohol can affect your balance and may make you more likely to have a fall. What actions can I take to lower my risk of falls? General instructions  Talk with your health care provider about your risks for falling. Tell your health care provider if: ? You fall. Be sure to tell your health care provider about all falls, even ones that seem minor. ? You feel dizzy, sleepy, or off-balance.  Take over-the-counter and prescription medicines only as told by your health care provider. These include any supplements.  Eat a healthy diet and maintain a healthy weight. A healthy diet includes low-fat dairy products, low-fat (lean) meats, and fiber from whole grains, beans, and lots of fruits and vegetables. Home safety  Remove any tripping hazards, such as rugs, cords, and clutter.  Install safety equipment such as grab bars in bathrooms and safety rails on stairs.  Keep rooms and walkways well-lit. Activity  Follow a regular exercise program to stay fit. This will help you maintain your balance. Ask your health care provider what types of exercise are appropriate for you.  If you need a cane or walker,   use it as recommended by your health care provider.  Wear supportive shoes that have nonskid soles.   Lifestyle  Do not drink alcohol if your health care provider tells you not to drink.  If you drink alcohol, limit how much you have: ? 0-1 drink a day for women. ? 0-2 drinks a day for men.  Be aware of how much alcohol is in your drink. In the [place] one drink equals one typical bottle of beer (12  oz), one-half glass of wine (5 oz), or one shot of hard liquor (1 oz).  Do not use any products that contain nicotine or tobacco, such as cigarettes and e-cigarettes. If you need help quitting, ask your health care provider. Summary  Having a healthy lifestyle and getting preventive care can help to protect your health and wellness after age 74.  Screening and testing are the best way to find a health problem early and help you avoid having a fall. Early diagnosis and treatment give you the best chance for managing medical conditions that are more common for people who are older than age 74.  Falls are a major cause of broken bones and head injuries in people who are older than age 74. Take precautions to prevent a fall at home.  Work with your health care provider to learn what changes you can make to improve your health and wellness and to prevent falls. This information is not intended to replace advice given to you by your health care provider. Make sure you discuss any questions you have with your health care provider. Document Revised: 07/06/2018 Document Reviewed: 01/26/2017 Elsevier Patient Education  2021 Elsevier Inc.  

## 2020-06-26 NOTE — Progress Notes (Signed)
This visit occurred during the SARS-CoV-2 public health emergency.  Safety protocols were in place, including screening questions prior to the visit, additional usage of staff PPE, and extensive cleaning of exam room while observing appropriate contact time as indicated for disinfecting solutions.    Patient ID: Yvette Short, female  DOB: July 15, 1945, 75 y.o.   MRN: [id number] Patient Care Team    Relationship Specialty Notifications Start End  Ma Hillock, DO PCP - General Family Medicine  08/03/17   Berniece Salines, DO PCP - Cardiology Cardiology  09/04/19   Dian Queen, MD Consulting Physician Obstetrics and Gynecology  08/03/17   Tanda Rockers, MD Consulting Physician Pulmonary Disease  08/03/17   Lyndal Pulley, DO Consulting Physician Sports Medicine  08/03/17   Celedonio Miyamoto, MD Referring Physician Gastroenterology  08/03/17   Marica Otter, [place]  Optometry  08/03/17     Chief Complaint  Patient presents with  . Annual Exam    Pt is fasting;     Subjective:  Yvette Short is a 76 y.o.  Female  present for CPE/CMC. All past medical history, surgical history, allergies, family history, immunizations, medications and social history were updated in the electronic medical record today. All recent labs, ED visits and hospitalizations within the last year were reviewed.  Health maintenance:  Colonoscopy: 03/31/2017- Dr. Darlen Round- multiple polyps? Follow up. Mammogram: completed:05/2019 and scheduled for 07/07/2020, [facility] Cervical cancer screening:> 65- est with Dr. Monica Becton.  Immunizations: tdap UTD 2018, Influenza UTD 2021 (encouraged yearly), PNA series completed, zostavax completed, covid x3 Infectious disease screening: Hep C completed DEXA: last completed 2018 ? GYN Assistive device: none Oxygen HWT:UUEK Patient has a Dental home. Hospitalizations/ED visits: reviewed  Essential hypertension/hyperlipidemia/statin use Pt reportscompliance with losartan 12.5 mg QD,  statin, asa and cardiology managing acebutolol. Patient denies chest pain, shortness of breath, dizziness or lower extremity edema.  Diet: low sodium Exercise: routine exercise RF: htn, hld, former smoker, fhx HD EKG 2012: Multiple PAC appreciated. 5/15/2019EKG: SR. HR 63. Frequent PAC, PR 136 QT 442. Unchanged from prior EKG.  Chronic cough/Pharyngoesophageal dysphagia/Bronchiectasis without complication (HCC)/GERD Pt reports she still has cough. She is not routinely taking the omeprazole daly. She is taking her carafate every other day. Prior note: Evaluated by Pulm (Dr. Melvyn Novas), ENT and had EGD 2017 by Dr. Kenton Kingfisher. Pt reports she has had a chronic coughsince before 2016. She was originally started omeprazole after EGD. She was told she has a leaky stomach valve, but no barrett's esophagus. Her omeprazole was increased to 80 mg a day by Dr. Melvyn Novas. He also recommended she increase gabapentin to BID, but she was unable to tolerate increase dose. She has been prescribed tessalon perles. She has had occassions in which food becomes stuck or slow transit to stomach. Her colonoscopy is UTD completed 03/2017 with 1 polyp. 5 year recall. She is a former smoker.She has frequent attacks after exercise as well. - taking Carafate (originally started by GYN))>> which she feels is workingbut still has cough.  Ct Chest High Resolution Result Date: 07/12/2017-->IMPRESSION: 1. Scattered minimal cylindrical and varicoid bronchiectasis with associated scattered mucoid impaction and minimal tree-in-bud opacity in both lungs, predominantly in the mid to lower lungs. Findings are stable to slightly worsened since 2015 chest CT. Findings may be due to chronic infectious bronchiolitis from atypical mycobacterial infection (MAI). 2. Two vessel coronary atherosclerosis. Aortic Atherosclerosis (ICD10-I70.0). Electronically Signed By: Ilona Sorrel M.D. On: 07/12/2017 12:56   Depression screen PHQ  2/9 06/26/2020  05/07/2020 12/25/2019 02/07/2019 08/03/2017  Decreased Interest 0 0 1 1 0  Down, Depressed, Hopeless 1 0 0 0 0  PHQ - 2 Score 1 0 1 1 0  Altered sleeping 1 - 0 - -  Tired, decreased energy 1 - 2 - -  Change in appetite 2 - 1 - -  Feeling bad or failure about yourself  1 - 0 - -  Trouble concentrating 0 - 0 - -  Moving slowly or fidgety/restless 0 - 0 - -  Suicidal thoughts 0 - 0 - -  PHQ-9 Score 6 - 4 - -   GAD 7 : Generalized Anxiety Score 06/26/2020 12/25/2019  Nervous, Anxious, on Edge 1 0  Control/stop worrying 0 0  Worry too much - different things 0 0  Trouble relaxing 0 0  Restless 0 0  Easily annoyed or irritable 1 1  Afraid - awful might happen 0 0  Total GAD 7 Score 2 1    Immunization History  Administered Date(s) Administered  . Fluad Quad(high Dose 65+) 12/25/2019  . Influenza Split 12/21/2010, 12/21/2011  . Influenza Whole 01/04/2008, 12/31/2008, 01/14/2010  . Influenza, High Dose Seasonal PF 01/03/2013, 12/26/2013, 02/12/2016, 12/28/2016  . Influenza-Unspecified 12/06/2017  . PFIZER(Purple Top)SARS-COV-2 Vaccination 04/22/2019, 05/14/2019, 01/25/2020  . Pneumococcal Conjugate-13 02/16/2016  . Pneumococcal Polysaccharide-23 04/10/2012  . Td 03/09/2001, 12/10/2008  . Tdap 12/10/2008, 12/28/2016  . Zoster 12/21/2010  . Zoster Recombinat (Shingrix) 08/03/2017, 12/29/2017, 12/29/2017     Past Medical History:  Diagnosis Date  . Abnormal colonoscopy 03/2017   decreased rectal tone and polyp; 5 year recall  . Abnormal findings on esophagogastroduodenoscopy (EGD) 02/25/2016   Z-line irrg. at 40 cm. No abnomrality of the esophagus to explain dysphagia. Esophagus dilated. H/o + H. Pylori  . Allergy   . ANXIETY 07/06/2007  . Aortic atherosclerosis ([place]) 02/08/2018  . Aortic valve regurgitation   . Atherosclerosis of native coronary artery of native heart without angina pectoris 02/08/2018  . Biceps tendonitis on right   . Bronchiectasis without complication ([place])  99/83/3825   CT 2012 IMPRESSION:  1. Apical scarring may account for the plain film abnormality.  2. A 6 mm right upper lobe pulmonary nodule.  - - HRCT 07/12/2017 >>>  Scattered minimal cylindrical and varicoid bronchiectasis with associated scattered mucoid impaction and minimal tree-in-bud opacity in both lungs, predominantly in the mid to lower lungs. Findings are stable to slightly worsened since 2015 ches  . Chicken pox   . Chronic cough 05/18/2017   CT chest 03/19/14  No bronchiectasis (not hrct)  Spirometry 05/18/2017  FEV1  2.15 (93%)  Ratio 76 s rx prior  - FENO 05/18/2017  =   Could not perform   - Allergy profile 05/18/2017 >  Eos 0.2 /  IgE  75 RAST pos cat > dog > mold - HRCT 07/12/2017 >>>  See bronchiectasis - trial of dymista 07/26/2017 > some better but not consistent with it > rechallenge rx one puff each am 10/25/2017  - MCT 11/02/17 >    . Colon polyp 03/31/2017   hyperplastic  . Diverticulosis 03/2017   sigmoid  . Essential hypertension 12/21/2010  . Family history of breast cancer   . Family history of colon cancer   . Family history of ovarian cancer   . Frequent UTI   . Genetic testing 03/26/2016   Negative genetic testing on the [facility] 30 gene panel.  The 30-Gene Cancer Panel offered by [facility] includes  sequencing and/or deletion duplication testing of the following 30 genes: APC, ATM, BAP1, BARD1, BMPR1A, BRCA1, BRCA2, BRIP1, CDH1, CDK4, CDKN2A (p14ARF), CDKN2A (p16INK4a), CHEK2, EPCAM, GREM1, MITF, MLH1, MSH2, MSH6, MUTYH, NBN, PALB2, PMS2, POLD1, POLE, PTEN, RAD51C, RAD51D, SMAD4,   . GERD 11/09/2006  . H. pylori infection    h/o treated wiht prevpac  . Hormone replacement therapy 01/08/2019  . HYPERLIPIDEMIA 11/09/2006  . Hyperlipidemia 11/09/2006   Qualifier: Diagnosis of  By: Leanne Chang MD, [place]    . Hypertension   . Hypothyroidism 08/03/2017  . Lumbar radiculopathy 07/24/2014  . Migraine   . Mild depression ([place]) 02/14/2019  . Pharyngoesophageal dysphagia 08/03/2017  .  Piriformis syndrome of right side   . Spinal stenosis, lumbar region, with neurogenic claudication 10/16/2014   Epidural 10/02/2014, December 2016   . Stomach ulcer 2008   Allergies  Allergen Reactions  . Tetracycline Hcl Rash        Past Surgical History:  Procedure Laterality Date  . CERVICAL POLYPECTOMY  01/17/2019  . LIPOMA EXCISION     of back  . TUBAL LIGATION     Family History  Problem Relation Age of Onset  . Dementia Mother   . Hyperlipidemia Mother   . Colon cancer Father 33  . Colon polyps Brother        gets colonoscopy every 2-3 years  . Hyperlipidemia Brother   . Hypertension Brother   . Uterine cancer Maternal Aunt        dx in her 73s  . Colon cancer Maternal Uncle   . Colon cancer Paternal Aunt   . Heart attack Maternal Grandfather   . Cancer Other        MGMs sister - "abdominal cancer"  . Breast cancer Cousin    Social History   Social History Narrative   Married. Two children.    College grad.    Former smoker.    Exercises routinely.    Drink caffeine.    Wears a hearing aid.    Smoke alarm in the home.    Wears her seatbelt.    Feels safe in her relationships.     Allergies as of 06/26/2020      Reactions   Tetracycline Hcl Rash          Medication List       Accurate as of June 26, 2020  9:23 AM. If you have any questions, ask your nurse or doctor.        STOP taking these medications   venlafaxine XR 37.5 MG 24 hr capsule Commonly known as: EFFEXOR-XR Stopped by: Howard Pouch, DO     TAKE these medications   acebutolol 200 MG capsule Commonly known as: SECTRAL Take 1 capsule (200 mg total) by mouth daily.   aspirin EC 81 MG tablet Take 81 mg by mouth daily. Swallow whole.   atorvastatin 40 MG tablet Commonly known as: Lipitor Take 1 tablet (40 mg total) by mouth daily.   Azelaic Acid 15 % cream APPLY TO AFFECTED AREA ON FACE UP TO TWO TIMES A DAY AS NEEDED   chlorpheniramine 4 MG tablet Commonly known as:  CHLOR-TRIMETON Take 4 mg by mouth every 4 (four) hours as needed for allergies.   estradiol 0.0375 MG/24HR Commonly known as: VIVELLE-DOT Place 1 patch onto the skin 2 (two) times a week.   gabapentin 100 MG capsule Commonly known as: NEURONTIN TAKE 2-3 CAPSULES (200-300 MG TOTAL) BY MOUTH AT BEDTIME.   levothyroxine 88 MCG  tablet Commonly known as: SYNTHROID Take 88 mcg by mouth daily.   losartan 25 MG tablet Commonly known as: COZAAR Take 0.5 tablets (12.5 mg total) by mouth daily.   omeprazole 40 MG capsule Commonly known as: PRILOSEC Take 1 capsule (40 mg total) by mouth daily. Take 30- 60 min before your first meal   progesterone 100 MG capsule Commonly known as: PROMETRIUM Take by mouth.   sucralfate 1 GM/10ML suspension Commonly known as: CARAFATE Take 10 mLs (1 g total) by mouth in the morning, at noon, in the evening, and at bedtime. Every other day   Vitamin D2 50 MCG (2000 UT) Tabs Take 1 tablet by mouth daily.       All past medical history, surgical history, allergies, family history, immunizations andmedications were updated in the EMR today and reviewed under the history and medication portions of their EMR.     No results found for this or any previous visit (from the past 2160 hour(s)).    ROS: 14 pt review of systems performed and negative (unless mentioned in an HPI)  Objective: BP 100/64   Pulse 66   Temp 98.1 F (36.7 C) (Oral)   Ht '5\' 4"'  (1.626 m)   Wt 127 lb (57.6 kg)   SpO2 100%   BMI 21.80 kg/m  Gen: Afebrile. No acute distress. Nontoxic in appearance, well-developed, well-nourished,  Pleasant thin female.  HENT: AT. [place]. Bilateral TM visualized and normal in appearance with the exception of mild clear grainage, normal external auditory canal. MMM, no oral lesions, adequate dentition. Bilateral nares within normal limits. Throat without erythema, ulcerations or exudates. Mild chronic Cough on exam, no hoarseness on exam. Eyes:Pupils  Equal Round Reactive to light, Extraocular movements intact,  Conjunctiva without redness, discharge or icterus. Neck/lymp/endocrine: Supple,no lymphadenopathy, no thyromegaly CV: RRR no murmur, noedema, +2/4 P posterior tibialis pulses.  Chest: CTAB, no wheeze, rhonchi or crackles. normal Respiratory effort. good Air movement. Abd: Soft. flat. ND. Mild TTP mid epigastric. BS present. no Masses palpated. No hepatosplenomegaly. No rebound tenderness or guarding. Skin: no rashes, purpura or petechiae. Warm and well-perfused. Skin intact. Neuro/Msk:  Normal gait. PERLA. EOMi. Alert. Oriented x3.  Cranial nerves II through XII intact. Muscle strength 5/5 upper/lower extremity. DTRs equal bilaterally. Psych: Normal affect, dress and demeanor. Normal speech. Normal thought content and judgment.   No exam data present  Assessment/plan: Yvette Short is a 75 y.o. female present for [place] Essential hypertension/hyperlipidemia/palpitations/aortic ad coronary atherosclerosis /Aortic atherosclerosis ([place]) - stable.   - continue  Losartan 12.5 mg daily.  - cardio discontinue amlodipine 10 mg QD and started acebutolol 200 mg BID - continue  Statin (cardio) - continue asa - cbc, cmp, lipid and TSH collected today.  - Exercise: routine exercise - F/U 5.5 months.   Chronic cough/Pharyngoesophageal dysphagia/Bronchiectasis without complication (HCC)/GERD/H.Pylori infection h/o - chronic coughstarted ~2016, on high dose omeprazole- but does not take daily. Last EGD 2017, Dr. Kenton Kingfisher - pulmonology(Dr. Wert)did not feel it was caused by lung condition. Ct abnormal w/ tree in bud.  - [facility] and Dr. Collene Mares office did not accept her bc they did not feel they could offer her more. - Encouraged her to contiue to follow with Dr. Doreene Adas practice (he has since left) and if needed could try to refer to [facility] for another opinion or [facility]>>>now on carafate and seems to be helping.  Encouraged  her to restart the PPI as well as continue the Carafate.  Continue  omeprazole.  Continue carafate prn - triedalbuterol before exercise>>patient reported it was not helpful. - F/U PRN  Hypothyroidism, unspecified type Tsh collected today Medication managed by   Diabetes mellitus screening - Hemoglobin A1c  Routine general medical examination at a health care facility Patient was encouraged to exercise greater than 150 minutes a week. Patient was encouraged to choose a diet filled with fresh fruits and vegetables, and lean meats. AVS provided to patient today for education/recommendation on gender specific health and safety maintenance. Colonoscopy: 03/31/2017- Dr. Darlen Round- multiple polyps? Follow up. Mammogram: completed:05/2019 and scheduled for 07/07/2020, [facility] Cervical cancer screening:> 65- est with Dr. Monica Becton.  Immunizations: tdap UTD 2018, Influenza UTD 2021 (encouraged yearly), PNA series completed, zostavax completed, covid x3 Infectious disease screening: Hep C completed DEXA: last completed 2018 ? GYN- has scheduled 5/3 at GYN.  Return in about 6 months (around 12/15/2020) for CMC (30 min) and 1 yr cpe.   Orders Placed This Encounter  Procedures  . CBC with Differential/Platelet  . Comprehensive metabolic panel  . Hemoglobin A1c  . Lipid panel  . TSH   Meds ordered this encounter  Medications  . losartan (COZAAR) 25 MG tablet    Sig: Take 0.5 tablets (12.5 mg total) by mouth daily.    Dispense:  45 tablet    Refill:  1  . omeprazole (PRILOSEC) 40 MG capsule    Sig: Take 1 capsule (40 mg total) by mouth daily. Take 30- 60 min before your first meal    Dispense:  90 capsule    Refill:  1   Referral Orders  No referral(s) requested today     Electronically signed by: Howard Pouch, [place]

## 2020-07-07 ENCOUNTER — Other Ambulatory Visit: Payer: Self-pay

## 2020-07-07 ENCOUNTER — Ambulatory Visit
Admission: RE | Admit: 2020-07-07 | Discharge: 2020-07-07 | Disposition: A | Discharge status: Home / Self Care | Payer: Medicare PPO | Source: Ambulatory Visit | Attending: Obstetrics and Gynecology | Admitting: Obstetrics and Gynecology

## 2020-07-07 DIAGNOSIS — Z1231 Encounter for screening mammogram for malignant neoplasm of breast: Secondary | ICD-10-CM | POA: Diagnosis not present

## 2020-07-16 NOTE — Progress Notes (Signed)
[place] Phone: [phone] Subjective:   Fontaine No, am serving as a scribe for Dr. Hulan Saas. This visit occurred during the SARS-CoV-2 public health emergency.  Safety protocols were in place, including screening questions prior to the visit, additional usage of staff PPE, and extensive cleaning of exam room while observing appropriate contact time as indicated for disinfecting solutions.   I'm seeing this patient by the request  of:  Kuneff, Renee A, DO  CC: Low back follow-up and bilateral shoulder pain  [id number]  CENA BRUHN is a 75 y.o. female coming in with complaint of back pain. Last seen in March 2021 for lumbar spinal stenosis. Gabapentin helps to manage her back pain.   Patient states that she has been having pain in both shoulders. Did suffer a fall and landed on L elbow and shoulder and has had pain in anterior aspect since then. Patient notes discoloration from L shoulder to elbow. Also having pain in R shoulder, anterior aspect.   Negative L shoulder xray on 04/15/2020 after fall.     Past Medical History:  Diagnosis Date  . Abnormal colonoscopy 03/2017   decreased rectal tone and polyp; 5 year recall  . Abnormal findings on esophagogastroduodenoscopy (EGD) 02/25/2016   Z-line irrg. at 40 cm. No abnomrality of the esophagus to explain dysphagia. Esophagus dilated. H/o + H. Pylori  . Allergy   . ANXIETY 07/06/2007  . Aortic atherosclerosis ([place]) 02/08/2018  . Aortic valve regurgitation   . Atherosclerosis of native coronary artery of native heart without angina pectoris 02/08/2018  . Biceps tendonitis on right   . Bronchiectasis without complication ([place]) 62/13/0865   CT 2012 IMPRESSION:  1. Apical scarring may account for the plain film abnormality.  2. A 6 mm right upper lobe pulmonary nodule.  - - HRCT 07/12/2017 >>>  Scattered minimal cylindrical and varicoid bronchiectasis  with associated scattered mucoid impaction and minimal tree-in-bud opacity in both lungs, predominantly in the mid to lower lungs. Findings are stable to slightly worsened since 2015 ches  . Chicken pox   . Chronic cough 05/18/2017   CT chest 03/19/14  No bronchiectasis (not hrct)  Spirometry 05/18/2017  FEV1  2.15 (93%)  Ratio 76 s rx prior  - FENO 05/18/2017  =   Could not perform   - Allergy profile 05/18/2017 >  Eos 0.2 /  IgE  75 RAST pos cat > dog > mold - HRCT 07/12/2017 >>>  See bronchiectasis - trial of dymista 07/26/2017 > some better but not consistent with it > rechallenge rx one puff each am 10/25/2017  - MCT 11/02/17 >    . Colon polyp 03/31/2017   hyperplastic  . Diverticulosis 03/2017   sigmoid  . Essential hypertension 12/21/2010  . Family history of breast cancer   . Family history of colon cancer   . Family history of ovarian cancer   . Frequent UTI   . Genetic testing 03/26/2016   Negative genetic testing on the [facility] 30 gene panel.  The 30-Gene Cancer Panel offered by [facility] includes sequencing and/or deletion duplication testing of the following 30 genes: APC, ATM, BAP1, BARD1, BMPR1A, BRCA1, BRCA2, BRIP1, CDH1, CDK4, CDKN2A (p14ARF), CDKN2A (p16INK4a), CHEK2, EPCAM, GREM1, MITF, MLH1, MSH2, MSH6, MUTYH, NBN, PALB2, PMS2, POLD1, POLE, PTEN, RAD51C, RAD51D, SMAD4,   . GERD 11/09/2006  . H. pylori infection    h/o treated wiht prevpac  . Hormone replacement  therapy 01/08/2019  . HYPERLIPIDEMIA 11/09/2006  . Hyperlipidemia 11/09/2006   Qualifier: Diagnosis of  By: Leanne Chang MD, Bruce    . Hypertension   . Hypothyroidism 08/03/2017  . Lumbar radiculopathy 07/24/2014  . Migraine   . Mild depression ([place]) 02/14/2019  . Pharyngoesophageal dysphagia 08/03/2017  . Piriformis syndrome of right side   . Spinal stenosis, lumbar region, with neurogenic claudication 10/16/2014   Epidural 10/02/2014, December 2016   . Stomach ulcer 2008   Past Surgical History:  Procedure Laterality Date   . CERVICAL POLYPECTOMY  01/17/2019  . LIPOMA EXCISION     of back  . TUBAL LIGATION     Social History   Socioeconomic History  . Marital status: Married    Spouse name: Not on file  . Number of children: Not on file  . Years of education: Not on file  . Highest education level: Not on file  Occupational History  . Not on file  Tobacco Use  . Smoking status: Former Smoker    Packs/day: 2.50    Years: 13.00    Pack years: 32.50    Types: Cigarettes    Quit date: 07/28/1974    Years since quitting: 46.0  . Smokeless tobacco: Never Used  Vaping Use  . Vaping Use: Never used  Substance and Sexual Activity  . Alcohol use: Yes    Alcohol/week: 2.0 standard drinks    Types: 2 Cans of beer per week  . Drug use: No  . Sexual activity: Yes    Partners: Male  Other Topics Concern  . Not on file  Social History Narrative   Married. Two children.    College grad.    Former smoker.    Exercises routinely.    Drink caffeine.    Wears a hearing aid.    Smoke alarm in the home.    Wears her seatbelt.    Feels safe in her relationships.    Social Determinants of Health   Financial Resource Strain: Low Risk   . Difficulty of Paying Living Expenses: Not hard at all  Food Insecurity: No Food Insecurity  . Worried About Charity fundraiser in the Last Year: Never true  . Ran Out of Food in the Last Year: Never true  Transportation Needs: No Transportation Needs  . Lack of Transportation (Medical): No  . Lack of Transportation (Non-Medical): No  Physical Activity: Sufficiently Active  . Days of Exercise per Week: 7 days  . Minutes of Exercise per Session: 50 min  Stress: No Stress Concern Present  . Feeling of Stress : Not at all  Social Connections: Moderately Integrated  . Frequency of Communication with Friends and Family: More than three times a week  . Frequency of Social Gatherings with Friends and Family: More than three times a week  . Attends Religious Services: 1 to  4 times per year  . Active Member of Clubs or Organizations: No  . Attends Archivist Meetings: Never  . Marital Status: Married   Allergies  Allergen Reactions  . Tetracycline Hcl Rash        Family History  Problem Relation Age of Onset  . Dementia Mother   . Hyperlipidemia Mother   . Colon cancer Father 63  . Colon polyps Brother        gets colonoscopy every 2-3 years  . Hyperlipidemia Brother   . Hypertension Brother   . Uterine cancer Maternal Aunt  dx in her 50s  . Colon cancer Maternal Uncle   . Colon cancer Paternal Aunt   . Heart attack Maternal Grandfather   . Cancer Other        MGMs sister - "abdominal cancer"  . Breast cancer Cousin     Current Outpatient Medications (Endocrine & Metabolic):  .  estradiol (VIVELLE-DOT) 0.0375 MG/24HR, Place 1 patch onto the skin 2 (two) times a week. .  levothyroxine (SYNTHROID, LEVOTHROID) 88 MCG tablet, Take 88 mcg by mouth daily. .  progesterone (PROMETRIUM) 100 MG capsule, Take by mouth.  Current Outpatient Medications (Cardiovascular):  .  acebutolol (SECTRAL) 200 MG capsule, Take 1 capsule (200 mg total) by mouth daily. Marland Kitchen  atorvastatin (LIPITOR) 40 MG tablet, Take 1 tablet (40 mg total) by mouth daily. Marland Kitchen  losartan (COZAAR) 25 MG tablet, Take 0.5 tablets (12.5 mg total) by mouth daily.  Current Outpatient Medications (Respiratory):  .  chlorpheniramine (CHLOR-TRIMETON) 4 MG tablet, Take 4 mg by mouth every 4 (four) hours as needed for allergies.  Current Outpatient Medications (Analgesics):  .  aspirin EC 81 MG tablet, Take 81 mg by mouth daily. Swallow whole.   Current Outpatient Medications (Other):  Marland Kitchen  Azelaic Acid 15 % cream, APPLY TO AFFECTED AREA ON FACE UP TO TWO TIMES A DAY AS NEEDED .  Ergocalciferol (VITAMIN D2) 2000 units TABS, Take 1 tablet by mouth daily. Marland Kitchen  gabapentin (NEURONTIN) 100 MG capsule, TAKE 2-3 CAPSULES (200-300 MG TOTAL) BY MOUTH AT BEDTIME. Marland Kitchen  omeprazole (PRILOSEC) 40 MG  capsule, Take 1 capsule (40 mg total) by mouth daily. Take 30- 60 min before your first meal .  sucralfate (CARAFATE) 1 GM/10ML suspension, Take 10 mLs (1 g total) by mouth in the morning, at noon, in the evening, and at bedtime. Every other day   Reviewed prior external information including notes and imaging from  primary care provider As well as notes that were available from care everywhere and other healthcare systems.  Past medical history, social, surgical and family history all reviewed in electronic medical record.  No pertanent information unless stated regarding to the chief complaint.   Review of Systems:  No headache, visual changes, nausea, vomiting, diarrhea, constipation, dizziness, abdominal pain, skin rash, fevers, chills, night sweats, weight loss, swollen lymph nodes, body aches, joint swelling, chest pain, shortness of breath, mood changes. POSITIVE muscle aches  Objective  Blood pressure 118/88, pulse 79, height '5\' 4"'  (1.626 m), weight 128 lb (58.1 kg), SpO2 98 %.   General: No apparent distress alert and oriented x3 mood and affect normal, dressed appropriately.  HEENT: Pupils equal, extraocular movements intact  Respiratory: Patient's speak in full sentences and does not appear short of breath  Cardiovascular: No lower extremity edema, non tender, no erythema  Gait normal with good balance and coordination.  MSK: Patient's bilateral shoulders show that patient does have some mild atrophy noted but good strength of the rotator cuff bilaterally.  Mild positive crossover bilaterally.  Very mild positive impingement.  Negative's speeds test today neck exam some mild loss of lordosis.  Negative Spurling's.  Low back exam does have some mild loss of lordosis and some degenerative scoliosis.  Minimal tightness though with FABER test bilaterally.  Mild increase in discomfort with extension of the back  [id number]; 15 additional minutes spent for Therapeutic exercises as stated in  above notes.  This included exercises focusing on stretching, strengthening, with significant focus on eccentric aspects.   Long term goals include  an improvement in range of motion, strength, endurance as well as avoiding reinjury. Patient's frequency would include in 1-2 times a day, 3-5 times a week for a duration of 6-12 weeks. Shoulder Exercises that included:  Basic scapular stabilization to include adduction and depression of scapula Scaption, focusing on proper movement and good control Internal and External rotation utilizing a theraband, with elbow tucked at side entire time Rows with theraband    Proper technique shown and discussed handout in great detail with ATC.  All questions were discussed and answered.      Impression and Recommendations:     The above documentation has been reviewed and is accurate and complete Lyndal Pulley, DO

## 2020-07-17 ENCOUNTER — Encounter: Payer: Self-pay | Admitting: Family Medicine

## 2020-07-17 ENCOUNTER — Ambulatory Visit: Payer: Medicare PPO | Admitting: Family Medicine

## 2020-07-17 ENCOUNTER — Ambulatory Visit (INDEPENDENT_AMBULATORY_CARE_PROVIDER_SITE_OTHER): Payer: Medicare PPO

## 2020-07-17 ENCOUNTER — Other Ambulatory Visit: Payer: Self-pay

## 2020-07-17 VITALS — BP 118/88 | HR 79 | Ht 64.0 in | Wt 128.0 lb

## 2020-07-17 DIAGNOSIS — M50322 Other cervical disc degeneration at C5-C6 level: Secondary | ICD-10-CM | POA: Diagnosis not present

## 2020-07-17 DIAGNOSIS — G8929 Other chronic pain: Secondary | ICD-10-CM | POA: Diagnosis not present

## 2020-07-17 DIAGNOSIS — M25512 Pain in left shoulder: Secondary | ICD-10-CM | POA: Diagnosis not present

## 2020-07-17 DIAGNOSIS — M48062 Spinal stenosis, lumbar region with neurogenic claudication: Secondary | ICD-10-CM

## 2020-07-17 DIAGNOSIS — M25511 Pain in right shoulder: Secondary | ICD-10-CM | POA: Insufficient documentation

## 2020-07-17 DIAGNOSIS — M542 Cervicalgia: Secondary | ICD-10-CM

## 2020-07-17 DIAGNOSIS — M47812 Spondylosis without myelopathy or radiculopathy, cervical region: Secondary | ICD-10-CM | POA: Diagnosis not present

## 2020-07-17 NOTE — Assessment & Plan Note (Signed)
Patient is relatively stable.  Responding well to the gabapentin.

## 2020-07-17 NOTE — Patient Instructions (Addendum)
Xray today Exercises for scapular  Voltren gel Ice 20 min 2x a day See me again in 6 weeks

## 2020-07-17 NOTE — Assessment & Plan Note (Signed)
Patient has not had more bilateral shoulder pain for quite some time.  This seems to be both shoulders.  Left pain only occurred after her injury and fall 3 months ago.  Patient does have good range of motion and rotator cuff strength seems to be intact bilaterally.  Patient does seem a little bit more deconditioned than where he we have seen him previously.  Discussed home exercises.  Patient declined formal physical therapy.  X-rays of the cervical spine ordered as well secondary to the fall.  Refilled gabapentin that should help with neck as well as the back pain.  Follow-up with me again 6 weeks.  Worsening pain consider even the possibility of injections or possible formal physical therapy.

## 2020-07-19 ENCOUNTER — Other Ambulatory Visit: Payer: Self-pay | Admitting: Family Medicine

## 2020-07-28 ENCOUNTER — Encounter: Payer: Self-pay | Admitting: Family Medicine

## 2020-07-28 ENCOUNTER — Ambulatory Visit: Payer: Medicare PPO | Admitting: Family Medicine

## 2020-07-28 ENCOUNTER — Other Ambulatory Visit: Payer: Self-pay

## 2020-07-28 VITALS — BP 185/84 | HR 59 | Temp 98.1°F | Ht 64.0 in | Wt 128.0 lb

## 2020-07-28 DIAGNOSIS — R1013 Epigastric pain: Secondary | ICD-10-CM | POA: Diagnosis not present

## 2020-07-28 DIAGNOSIS — K219 Gastro-esophageal reflux disease without esophagitis: Secondary | ICD-10-CM | POA: Diagnosis not present

## 2020-07-28 DIAGNOSIS — A048 Other specified bacterial intestinal infections: Secondary | ICD-10-CM | POA: Diagnosis not present

## 2020-07-28 DIAGNOSIS — R053 Chronic cough: Secondary | ICD-10-CM

## 2020-07-28 DIAGNOSIS — R198 Other specified symptoms and signs involving the digestive system and abdomen: Secondary | ICD-10-CM | POA: Diagnosis not present

## 2020-07-28 DIAGNOSIS — K259 Gastric ulcer, unspecified as acute or chronic, without hemorrhage or perforation: Secondary | ICD-10-CM | POA: Diagnosis not present

## 2020-07-28 DIAGNOSIS — R1314 Dysphagia, pharyngoesophageal phase: Secondary | ICD-10-CM | POA: Diagnosis not present

## 2020-07-28 MED ORDER — AZELASTINE HCL 0.1 % NA SOLN: 1.0000 | Freq: Two times a day (BID) | NASAL | 5 refills | Status: DC

## 2020-07-28 MED ORDER — PANTOPRAZOLE SODIUM 40 MG PO TBEC: 40.0000 mg | DELAYED_RELEASE_TABLET | Freq: Every day | ORAL | 3 refills | Status: DC

## 2020-07-28 MED ORDER — FAMOTIDINE 20 MG PO TABS: 20.0000 mg | ORAL_TABLET | Freq: Two times a day (BID) | ORAL | 2 refills | Status: DC

## 2020-07-28 MED ORDER — AMLODIPINE BESYLATE 5 MG PO TABS: 5.0000 mg | ORAL_TABLET | Freq: Every day | ORAL | 1 refills | Status: DC

## 2020-07-28 NOTE — Patient Instructions (Addendum)
Stop omeprazole Start Protonix once daily  and pepcid twice daily.  Continue Carafate as needed.  Start back the amlodipine 5 mg a day.   I will refer you to gastroenterology.     Food Choices for Gastroesophageal Reflux Disease, Adult When you have gastroesophageal reflux disease (GERD), the foods you eat and your eating habits are very important. Choosing the right foods can help ease your discomfort. Think about working with a food expert (dietitian) to help you make good choices. What are tips for following this plan? Reading food labels  Look for foods that are low in saturated fat. Foods that may help with your symptoms include: ? Foods that have less than 5% of daily value (DV) of fat. ? Foods that have 0 grams of trans fat. Cooking  Do not fry your food.  Cook your food by baking, steaming, grilling, or broiling. These are all methods that do not need a lot of fat for cooking.  To add flavor, try to use herbs that are low in spice and acidity. Meal planning  Choose healthy foods that are low in fat, such as: ? Fruits and vegetables. ? Whole grains. ? Low-fat dairy products. ? Lean meats, fish, and poultry.  Eat small meals often instead of eating 3 large meals each day. Eat your meals slowly in a place where you are relaxed. Avoid bending over or lying down until 2-3 hours after eating.  Limit high-fat foods such as fatty meats or fried foods.  Limit your intake of fatty foods, such as oils, butter, and shortening.  Avoid the following as told by your doctor: ? Foods that cause symptoms. These may be different for different people. Keep a food diary to keep track of foods that cause symptoms. ? Alcohol. ? Drinking a lot of liquid with meals. ? Eating meals during the 2-3 hours before bed.   Lifestyle  Stay at a healthy weight. Ask your doctor what weight is healthy for you. If you need to lose weight, work with your doctor to do so safely.  Exercise for at least  30 minutes on 5 or more days each week, or as told by your doctor.  Wear loose-fitting clothes.  Do not smoke or use any products that contain nicotine or tobacco. If you need help quitting, ask your doctor.  Sleep with the head of your bed higher than your feet. Use a wedge under the mattress or blocks under the bed frame to raise the head of the bed.  Chew sugar-free gum after meals. What foods should eat? Eat a healthy, well-balanced diet of fruits, vegetables, whole grains, low-fat dairy products, lean meats, fish, and poultry. Each person is different. Foods that may cause symptoms in one person may not cause any symptoms in another person. Work with your doctor to find foods that are safe for you. The items listed above may not be a complete list of what you can eat and drink. Contact a food expert for more options.   What foods should I avoid? Limiting some of these foods may help in managing the symptoms of GERD. Everyone is different. Talk with a food expert or your doctor to help you find the exact foods to avoid, if any. Fruits Any fruits prepared with added fat. Any fruits that cause symptoms. For some people, this may include citrus fruits, such as oranges, grapefruit, pineapple, and lemons. Vegetables Deep-fried vegetables. [place] fries. Any vegetables prepared with added fat. Any vegetables that cause symptoms.  For some people, this may include tomatoes and tomato products, chili peppers, onions and garlic, and horseradish. Grains Pastries or quick breads with added fat. Meats and other proteins High-fat meats, such as fatty beef or pork, hot dogs, ribs, ham, sausage, salami, and bacon. Fried meat or protein, including fried fish and fried chicken. Nuts and nut butters, in large amounts. Dairy Whole milk and chocolate milk. Sour cream. Cream. Ice cream. Cream cheese. Milkshakes. Fats and oils Butter. Margarine. Shortening. Ghee. Beverages Coffee and tea, with or without  caffeine. Carbonated beverages. Sodas. Energy drinks. Fruit juice made with acidic fruits, such as orange or grapefruit. Tomato juice. Alcoholic drinks. Sweets and desserts Chocolate and cocoa. Donuts. Seasonings and condiments Pepper. Peppermint and spearmint. Added salt. Any condiments, herbs, or seasonings that cause symptoms. For some people, this may include curry, hot sauce, or vinegar-based salad dressings. The items listed above may not be a complete list of what you should not eat and drink. Contact a food expert for more options. Questions to ask your doctor Diet and lifestyle changes are often the first steps that are taken to manage symptoms of GERD. If diet and lifestyle changes do not help, talk with your doctor about taking medicines. Where to find more information  International Foundation for Gastrointestinal Disorders: aboutgerd.org Summary  When you have GERD, food and lifestyle choices are very important in easing your symptoms.  Eat small meals often instead of 3 large meals a day. Eat your meals slowly and in a place where you are relaxed.  Avoid bending over or lying down until 2-3 hours after eating.  Limit high-fat foods such as fatty meats or fried foods. This information is not intended to replace advice given to you by your health care provider. Make sure you discuss any questions you have with your health care provider. Document Revised: 09/24/2019 Document Reviewed: 09/24/2019 Elsevier Patient Education  [place].

## 2020-07-28 NOTE — Progress Notes (Signed)
This visit occurred during the SARS-CoV-2 public health emergency.  Safety protocols were in place, including screening questions prior to the visit, additional usage of staff PPE, and extensive cleaning of exam room while observing appropriate contact time as indicated for disinfecting solutions.    Yvette Short , 20-Jan-1946, 75 y.o., female MRN: [id number] Patient Care Team    Relationship Specialty Notifications Start End  Ma Hillock, DO PCP - General Family Medicine  08/03/17   Berniece Salines, DO PCP - Cardiology Cardiology  09/04/19   Dian Queen, MD Consulting Physician Obstetrics and Gynecology  08/03/17   Tanda Rockers, MD Consulting Physician Pulmonary Disease  08/03/17   Lyndal Pulley, DO Consulting Physician Sports Medicine  08/03/17   Celedonio Miyamoto, MD Referring Physician Gastroenterology  08/03/17   Marica Otter, [place]  Optometry  08/03/17     Chief Complaint  Patient presents with  . Cough    Pt c/o dry cough, upper abd pain, nausea x 9 yrs that worsen 10 days ago     Subjective: Pt presents for an OV with complaints of worsening nausea, epigastric discomfort and dry cough that started 10 days ago.  She has had chronic LPR and GERD, with chronic cough for about 9 years.  Her current gastroenterologist is no longer with the practice and she will need a new referral placed. She is prescribed omeprazole 40 mg daily, uses Carafate as needed.  Her last EGD was 2017 and essentially normal with some mild irritation/signs at the Z-line.  She has seen both ENT and pulmonology for the chronic cough and it was felt to be secondary to either her progesterone use or LPR.  She has a history of chronic allergies as well, however ENT did not feel her chronic cough was coming from her sinuses. She endorses symptoms worsening with spices and the use of certain foods such as cooking with onions etc.  She has been positive for H. pylori in the past.  Depression screen [facility] 2/9 06/26/2020  05/07/2020 12/25/2019 02/07/2019 08/03/2017  Decreased Interest 0 0 1 1 0  Down, Depressed, Hopeless 1 0 0 0 0  PHQ - 2 Score 1 0 1 1 0  Altered sleeping 1 - 0 - -  Tired, decreased energy 1 - 2 - -  Change in appetite 2 - 1 - -  Feeling bad or failure about yourself  1 - 0 - -  Trouble concentrating 0 - 0 - -  Moving slowly or fidgety/restless 0 - 0 - -  Suicidal thoughts 0 - 0 - -  PHQ-9 Score 6 - 4 - -    Allergies  Allergen Reactions  . Tetracycline Hcl Rash        Social History   Social History Narrative   Married. Two children.    College grad.    Former smoker.    Exercises routinely.    Drink caffeine.    Wears a hearing aid.    Smoke alarm in the home.    Wears her seatbelt.    Feels safe in her relationships.    Past Medical History:  Diagnosis Date  . Abnormal colonoscopy 03/2017   decreased rectal tone and polyp; 5 year recall  . Abnormal findings on esophagogastroduodenoscopy (EGD) 02/25/2016   Z-line irrg. at 40 cm. No abnomrality of the esophagus to explain dysphagia. Esophagus dilated. H/o + H. Pylori  . Allergy   . ANXIETY 07/06/2007  . Aortic atherosclerosis (  [place]) 02/08/2018  . Aortic valve regurgitation   . Atherosclerosis of native coronary artery of native heart without angina pectoris 02/08/2018  . Biceps tendonitis on right   . Bronchiectasis without complication ([place]) 93/90/3009   CT 2012 IMPRESSION:  1. Apical scarring may account for the plain film abnormality.  2. A 6 mm right upper lobe pulmonary nodule.  - - HRCT 07/12/2017 >>>  Scattered minimal cylindrical and varicoid bronchiectasis with associated scattered mucoid impaction and minimal tree-in-bud opacity in both lungs, predominantly in the mid to lower lungs. Findings are stable to slightly worsened since 2015 ches  . Chicken pox   . Chronic cough 05/18/2017   CT chest 03/19/14  No bronchiectasis (not hrct)  Spirometry 05/18/2017  FEV1  2.15 (93%)  Ratio 76 s rx prior  - FENO 05/18/2017  =    Could not perform   - Allergy profile 05/18/2017 >  Eos 0.2 /  IgE  75 RAST pos cat > dog > mold - HRCT 07/12/2017 >>>  See bronchiectasis - trial of dymista 07/26/2017 > some better but not consistent with it > rechallenge rx one puff each am 10/25/2017  - MCT 11/02/17 >    . Colon polyp 03/31/2017   hyperplastic  . Diverticulosis 03/2017   sigmoid  . Essential hypertension 12/21/2010  . Family history of breast cancer   . Family history of colon cancer   . Family history of ovarian cancer   . Frequent UTI   . Genetic testing 03/26/2016   Negative genetic testing on the [facility] 30 gene panel.  The 30-Gene Cancer Panel offered by [facility] includes sequencing and/or deletion duplication testing of the following 30 genes: APC, ATM, BAP1, BARD1, BMPR1A, BRCA1, BRCA2, BRIP1, CDH1, CDK4, CDKN2A (p14ARF), CDKN2A (p16INK4a), CHEK2, EPCAM, GREM1, MITF, MLH1, MSH2, MSH6, MUTYH, NBN, PALB2, PMS2, POLD1, POLE, PTEN, RAD51C, RAD51D, SMAD4,   . GERD 11/09/2006  . H. pylori infection    h/o treated wiht prevpac  . Hormone replacement therapy 01/08/2019  . HYPERLIPIDEMIA 11/09/2006  . Hyperlipidemia 11/09/2006   Qualifier: Diagnosis of  By: Leanne Chang MD, Bruce    . Hypertension   . Hypothyroidism 08/03/2017  . Lumbar radiculopathy 07/24/2014  . Migraine   . Mild depression ([place]) 02/14/2019  . Pharyngoesophageal dysphagia 08/03/2017  . Piriformis syndrome of right side   . Spinal stenosis, lumbar region, with neurogenic claudication 10/16/2014   Epidural 10/02/2014, December 2016   . Stomach ulcer 2008   Past Surgical History:  Procedure Laterality Date  . CERVICAL POLYPECTOMY  01/17/2019  . LIPOMA EXCISION     of back  . TUBAL LIGATION     Family History  Problem Relation Age of Onset  . Dementia Mother   . Hyperlipidemia Mother   . Colon cancer Father 68  . Colon polyps Brother        gets colonoscopy every 2-3 years  . Hyperlipidemia Brother   . Hypertension Brother   . Uterine cancer Maternal Aunt         dx in her 15s  . Colon cancer Maternal Uncle   . Colon cancer Paternal Aunt   . Heart attack Maternal Grandfather   . Cancer Other        MGMs sister - "abdominal cancer"  . Breast cancer Cousin    Allergies as of 07/28/2020      Reactions   Tetracycline Hcl Rash          Medication List  Accurate as of Jul 28, 2020 12:24 PM. If you have any questions, ask your nurse or doctor.        STOP taking these medications   omeprazole 40 MG capsule Commonly known as: PRILOSEC Stopped by: Howard Pouch, DO     TAKE these medications   acebutolol 200 MG capsule Commonly known as: SECTRAL Take 1 capsule (200 mg total) by mouth daily.   amLODipine 5 MG tablet Commonly known as: NORVASC Take 1 tablet (5 mg total) by mouth daily. Started by: Howard Pouch, DO   aspirin EC 81 MG tablet Take 81 mg by mouth daily. Swallow whole.   atorvastatin 40 MG tablet Commonly known as: Lipitor Take 1 tablet (40 mg total) by mouth daily.   Azelaic Acid 15 % gel APPLY TO AFFECTED AREA ON FACE UP TO TWO TIMES A DAY AS NEEDED   azelastine 0.1 % nasal spray Commonly known as: ASTELIN Place 1 spray into both nostrils 2 (two) times daily. Use in each nostril as directed Started by: Howard Pouch, DO   chlorpheniramine 4 MG tablet Commonly known as: CHLOR-TRIMETON Take 4 mg by mouth every 4 (four) hours as needed for allergies.   co-enzyme Q-10 30 MG capsule Take 30 mg by mouth 3 (three) times daily.   estradiol 0.0375 MG/24HR Commonly known as: VIVELLE-DOT Place 1 patch onto the skin 2 (two) times a week.   famotidine 20 MG tablet Commonly known as: Pepcid Take 1 tablet (20 mg total) by mouth 2 (two) times daily. Started by: Howard Pouch, DO   gabapentin 100 MG capsule Commonly known as: NEURONTIN TAKE 2-3 CAPSULES (200-300 MG TOTAL) BY MOUTH AT BEDTIME.   levothyroxine 88 MCG tablet Commonly known as: SYNTHROID Take 88 mcg by mouth daily.   losartan 25 MG  tablet Commonly known as: COZAAR Take 0.5 tablets (12.5 mg total) by mouth daily.   pantoprazole 40 MG tablet Commonly known as: PROTONIX Take 1 tablet (40 mg total) by mouth daily. Started by: Howard Pouch, DO   PROBIOTIC-10 PO Take by mouth.   progesterone 100 MG capsule Commonly known as: PROMETRIUM Take by mouth.   RED YEAST RICE PO Take by mouth.   sucralfate 1 GM/10ML suspension Commonly known as: CARAFATE Take 10 mLs (1 g total) by mouth in the morning, at noon, in the evening, and at bedtime. Every other day   Vitamin D2 50 MCG (2000 UT) Tabs Take 1 tablet by mouth daily.       All past medical history, surgical history, allergies, family history, immunizations andmedications were updated in the EMR today and reviewed under the history and medication portions of their EMR.     ROS: Negative, with the exception of above mentioned in HPI   Objective:  BP (!) 185/84   Pulse (!) 59   Temp 98.1 F (36.7 C) (Oral)   Ht _0  (1.626 m)   Wt 128 lb (58.1 kg)   SpO2 100%   BMI 21.97 kg/m  Body mass index is 21.97 kg/m. Gen: Afebrile. No acute distress. Nontoxic in appearance, well developed, well nourished.  HENT: AT. [place].  Eyes:Pupils Equal Round Reactive to light, Extraocular movements intact,  Conjunctiva without redness, discharge or icterus. Neck/lymp/endocrine: Supple, no lymphadenopathy, no thyromegaly CV: RRR no murmur, no edema Chest: CTAB, no wheeze or crackles. Good air movement, normal resp effort.  Abd: Soft.  Mild tenderness in the epigastric region ND. BS present.  No masses palpated. No rebound or guarding.  Neuro: Normal gait. PERLA. EOMi. Alert. Oriented x3  Psych: Normal affect, dress and demeanor. Normal speech. Normal thought content and judgment.  No exam data present No results found. No results found for this or any previous visit (from the past 24 hour(s)).  Assessment/Plan: Yvette Short is a 75 y.o. female present for OV for   Epigastric pain/GERD/chronic cough/history of dysphagia/history of gastric ulcer/history of H. pylori infection/history of abnormal EGD Lengthy discussion today with patient that her symptoms are most likely GERD related, but she also has a history of bronchiectasis. She follows with pulmonology-which feels that there is a possibility that her progesterone is playing a part in her chronic cough.  Patient reports she has discontinued her progesterone in the past and it did not improve her symptoms. Discontinue omeprazole Start Protonix daily Start Pepcid twice daily Continue Carafate as needed Continue Chlor-Trimeton Continue Flonase and add Astelin Consider discontinuing losartan 12.5 mg-she has been on this for some time.  However her blood pressure is not controlled and we are adding back medication now.  We will consider this in the future - Ambulatory referral to Gastroenterology  Hypertension: Restart amlodipine 5 mg daily Continue losartan 12.5 mg daily    Reviewed expectations re: course of current medical issues.  Discussed self-management of symptoms.  Outlined signs and symptoms indicating need for more acute intervention.  Patient verbalized understanding and all questions were answered.  Patient received an After-Visit Summary.    Orders Placed This Encounter  Procedures  . Ambulatory referral to Gastroenterology   Meds ordered this encounter  Medications  . famotidine (PEPCID) 20 MG tablet    Sig: Take 1 tablet (20 mg total) by mouth 2 (two) times daily.    Dispense:  60 tablet    Refill:  2  . azelastine (ASTELIN) 0.1 % nasal spray    Sig: Place 1 spray into both nostrils 2 (two) times daily. Use in each nostril as directed    Dispense:  30 mL    Refill:  5  . pantoprazole (PROTONIX) 40 MG tablet    Sig: Take 1 tablet (40 mg total) by mouth daily.    Dispense:  90 tablet    Refill:  3  . amLODipine (NORVASC) 5 MG tablet    Sig: Take 1 tablet (5 mg  total) by mouth daily.    Dispense:  90 tablet    Refill:  1    Referral Orders     Ambulatory referral to Gastroenterology   Note is dictated utilizing voice recognition software. Although note has been proof read prior to signing, occasional typographical errors still can be missed. If any questions arise, please do not hesitate to call for verification.   electronically signed by:  Howard Pouch, DO  [place]

## 2020-07-29 DIAGNOSIS — M8588 Other specified disorders of bone density and structure, other site: Secondary | ICD-10-CM | POA: Diagnosis not present

## 2020-07-29 DIAGNOSIS — N958 Other specified menopausal and perimenopausal disorders: Secondary | ICD-10-CM | POA: Diagnosis not present

## 2020-08-04 ENCOUNTER — Telehealth: Payer: Self-pay

## 2020-08-04 DIAGNOSIS — U071 COVID-19: Secondary | ICD-10-CM

## 2020-08-04 HISTORY — DX: COVID-19: U07.1

## 2020-08-04 NOTE — Telephone Encounter (Signed)
Pt called stating that she is COVID + and wanted to know what she needed to do now. Advised patient of COVID care and self isolation.

## 2020-08-05 ENCOUNTER — Telehealth: Payer: Self-pay

## 2020-08-05 NOTE — Telephone Encounter (Signed)
Patient is COVID positive effective yesterday.  She is calling our office to request information about IV therapy for patient who get COVID.  I asked patient When did your first symptoms start? Last week sometime, she is not running a fever  She ho that she is feeling better today.  Please advise [phone]

## 2020-08-06 ENCOUNTER — Telehealth (INDEPENDENT_AMBULATORY_CARE_PROVIDER_SITE_OTHER): Payer: Medicare PPO | Admitting: Family Medicine

## 2020-08-06 ENCOUNTER — Encounter: Payer: Self-pay | Admitting: Family Medicine

## 2020-08-06 DIAGNOSIS — U071 COVID-19: Secondary | ICD-10-CM | POA: Diagnosis not present

## 2020-08-06 NOTE — Progress Notes (Signed)
VIRTUAL VISIT VIA VIDEO  I connected with Yvette Short on 08/06/20 at  2:00 PM EDT by elemedicine application and verified that I am speaking with the correct person using two identifiers. Location patient: Home Location provider: [facility], Office Persons participating in the virtual visit: Patient, Dr. Raoul Pitch and Darnell Level. Cesar, CMA  I discussed the limitations of evaluation and management by telemedicine and the availability of in person appointments. The patient expressed understanding and agreed to proceed.   SUBJECTIVE Chief Complaint  Patient presents with  . Covid Positive    Pt c/o cough, fatigue, fever with a temp of 100.7, loss of taste x 3 days; pt tested pos with a home test;     HPI: Yvette Short is a 75 y.o. female present for acute illness of cough, fatigue, fever, low grade temp 100.7 F )Monday only), loss of taste of 3 days duration. Tested positive to covid 5/9. covid vaccine x3.  Symptom onset Sunday night w/ cough. Reports improvement daily in symptoms and does not feel as ill as she did when she had the flu a few yrs ago. Tolerating PO.  No shortness of breath.  covid risk 4 for complications.   ROS: See pertinent positives and negatives per HPI.  Patient Active Problem List   Diagnosis Date Noted  . Bilateral shoulder pain 07/17/2020  . H. pylori infection   . Family history of ovarian cancer   . Family history of breast cancer   . Aortic valve regurgitation   . Allergy   . Moderate aortic regurgitation 10/17/2019  . Non-sustained ventricular tachycardia ([place]) 09/04/2019  . Frequent PAC (premature atrial contraction) 09/04/2019  . Moderate mitral regurgitation 09/04/2019  . Rheumatic aortic valve insufficiency 09/04/2019  . Hormone replacement therapy 01/08/2019  . Aortic atherosclerosis ([place]) 02/08/2018  . Atherosclerosis of native coronary artery of native heart without angina pectoris 02/08/2018  . Hypothyroidism 08/03/2017  .  Pharyngoesophageal dysphagia 08/03/2017  . Chronic cough 05/18/2017  . Family history of colon cancer 04/03/2017  . Colon polyp 03/31/2017  . Diverticulosis 03/2017  . Abnormal colonoscopy 03/2017  . Genetic testing 03/26/2016  . Abnormal findings on esophagogastroduodenoscopy (EGD) 02/25/2016  . Spinal stenosis, lumbar region, with neurogenic claudication 10/16/2014  . Lumbar radiculopathy 07/24/2014  . Bronchiectasis without complication ([place]) 21/19/4174  . Essential hypertension 12/21/2010  . Hyperlipidemia 11/09/2006  . GERD 11/09/2006  . Stomach ulcer 2008    Social History   Tobacco Use  . Smoking status: Former Smoker    Packs/day: 2.50    Years: 13.00    Pack years: 32.50    Types: Cigarettes    Quit date: 07/28/1974    Years since quitting: 46.0  . Smokeless tobacco: Never Used  Substance Use Topics  . Alcohol use: Yes    Alcohol/week: 2.0 standard drinks    Types: 2 Cans of beer per week    Current Outpatient Medications:  .  acebutolol (SECTRAL) 200 MG capsule, Take 1 capsule (200 mg total) by mouth daily., Disp: 90 capsule, Rfl: 2 .  amLODipine (NORVASC) 5 MG tablet, Take 1 tablet (5 mg total) by mouth daily., Disp: 90 tablet, Rfl: 1 .  aspirin EC 81 MG tablet, Take 81 mg by mouth daily. Swallow whole., Disp: , Rfl:  .  atorvastatin (LIPITOR) 40 MG tablet, Take 1 tablet (40 mg total) by mouth daily., Disp: 90 tablet, Rfl: 3 .  Azelaic Acid 15 % cream, APPLY TO AFFECTED AREA ON FACE UP  TO TWO TIMES A DAY AS NEEDED, Disp: , Rfl:  .  azelastine (ASTELIN) 0.1 % nasal spray, Place 1 spray into both nostrils 2 (two) times daily. Use in each nostril as directed, Disp: 30 mL, Rfl: 5 .  chlorpheniramine (CHLOR-TRIMETON) 4 MG tablet, Take 4 mg by mouth every 4 (four) hours as needed for allergies., Disp: , Rfl:  .  co-enzyme Q-10 30 MG capsule, Take 30 mg by mouth 3 (three) times daily., Disp: , Rfl:  .  Ergocalciferol (VITAMIN D2) 2000 units TABS, Take 1 tablet by mouth  daily., Disp: , Rfl:  .  estradiol (VIVELLE-DOT) 0.0375 MG/24HR, Place 1 patch onto the skin 2 (two) times a week., Disp: , Rfl:  .  famotidine (PEPCID) 20 MG tablet, Take 1 tablet (20 mg total) by mouth 2 (two) times daily., Disp: 60 tablet, Rfl: 2 .  gabapentin (NEURONTIN) 100 MG capsule, TAKE 2-3 CAPSULES (200-300 MG TOTAL) BY MOUTH AT BEDTIME., Disp: 270 capsule, Rfl: 1 .  levothyroxine (SYNTHROID, LEVOTHROID) 88 MCG tablet, Take 88 mcg by mouth daily., Disp: , Rfl: 3 .  losartan (COZAAR) 25 MG tablet, Take 0.5 tablets (12.5 mg total) by mouth daily., Disp: 45 tablet, Rfl: 1 .  pantoprazole (PROTONIX) 40 MG tablet, Take 1 tablet (40 mg total) by mouth daily., Disp: 90 tablet, Rfl: 3 .  Probiotic Product (PROBIOTIC-10 PO), Take by mouth., Disp: , Rfl:  .  progesterone (PROMETRIUM) 100 MG capsule, Take by mouth., Disp: , Rfl:  .  Red Yeast Rice Extract (RED YEAST RICE PO), Take by mouth., Disp: , Rfl:  .  sucralfate (CARAFATE) 1 GM/10ML suspension, Take 10 mLs (1 g total) by mouth in the morning, at noon, in the evening, and at bedtime. Every other day, Disp: 420 mL, Rfl: 5 .  ketoconazole (NIZORAL) 2 % shampoo, ketoconazole 2 % shampoo  APPLY 1(ONE) APPLICATION(S) TOPICAL EVERY DAY (CAN USE ON FOREHEAD/FACE PER PROVIDER), Disp: , Rfl:   Allergies  Allergen Reactions  . Tetracycline Hcl Rash         OBJECTIVE: There were no vitals taken for this visit. Gen: No acute distress. Nontoxic in appearance.  HENT: AT. [place].  MMM.  Eyes:Pupils Equal Round Reactive to light, Extraocular movements intact,  Conjunctiva without redness, discharge or icterus. Chest: Cough not present on exam today Skin: no rashes, purpura or petechiae.  Neuro:  Normal gait. Alert. Oriented x3  Psych: Normal affect and demeanor. Normal speech. Normal thought content and judgment.  ASSESSMENT AND PLAN: Yvette Short is a 75 y.o. female present for  COVID-19 She reports seeing improvement daily in her symptoms.   Rest, hydrate.  mucinex (DM if cough), nettie pot or nasal saline.  OTC supportive care discussed.  Emergent precautions discussed.  She declined Paxlovid script.  F/U 2 weeks 23f not improved- sooner if worsening.    Howard Pouch, DO 08/06/2020   No follow-ups on file.  No orders of the defined types were placed in this encounter.  No orders of the defined types were placed in this encounter.  Referral Orders  No referral(s) requested today

## 2020-08-06 NOTE — Patient Instructions (Signed)

## 2020-08-06 NOTE — Telephone Encounter (Signed)
Pt called again today and was still wondering about medication for COVID. Placed pt on schedule for 08/06/20 @ 2pm. States her temp is 100.7 and she is very fatigue.

## 2020-08-12 ENCOUNTER — Encounter: Payer: Self-pay | Admitting: Family Medicine

## 2020-08-28 ENCOUNTER — Ambulatory Visit: Payer: Medicare PPO | Admitting: Family Medicine

## 2020-08-28 DIAGNOSIS — Z800 Family history of malignant neoplasm of digestive organs: Secondary | ICD-10-CM | POA: Diagnosis not present

## 2020-08-28 DIAGNOSIS — R059 Cough, unspecified: Secondary | ICD-10-CM | POA: Diagnosis not present

## 2020-09-03 DIAGNOSIS — G5701 Lesion of sciatic nerve, right lower limb: Secondary | ICD-10-CM | POA: Insufficient documentation

## 2020-09-03 DIAGNOSIS — B019 Varicella without complication: Secondary | ICD-10-CM | POA: Insufficient documentation

## 2020-09-03 DIAGNOSIS — I10 Essential (primary) hypertension: Secondary | ICD-10-CM | POA: Insufficient documentation

## 2020-09-03 DIAGNOSIS — M7521 Bicipital tendinitis, right shoulder: Secondary | ICD-10-CM | POA: Insufficient documentation

## 2020-09-08 ENCOUNTER — Encounter: Payer: Self-pay | Admitting: Cardiology

## 2020-09-08 ENCOUNTER — Other Ambulatory Visit: Payer: Self-pay

## 2020-09-08 ENCOUNTER — Ambulatory Visit (INDEPENDENT_AMBULATORY_CARE_PROVIDER_SITE_OTHER): Payer: Medicare PPO | Admitting: Cardiology

## 2020-09-08 DIAGNOSIS — I340 Nonrheumatic mitral (valve) insufficiency: Secondary | ICD-10-CM | POA: Diagnosis not present

## 2020-09-08 DIAGNOSIS — I351 Nonrheumatic aortic (valve) insufficiency: Secondary | ICD-10-CM

## 2020-09-08 DIAGNOSIS — I472 Ventricular tachycardia: Secondary | ICD-10-CM

## 2020-09-08 DIAGNOSIS — I491 Atrial premature depolarization: Secondary | ICD-10-CM | POA: Diagnosis not present

## 2020-09-08 DIAGNOSIS — I4729 Other ventricular tachycardia: Secondary | ICD-10-CM

## 2020-09-08 DIAGNOSIS — I10 Essential (primary) hypertension: Secondary | ICD-10-CM

## 2020-09-08 DIAGNOSIS — I2510 Atherosclerotic heart disease of native coronary artery without angina pectoris: Secondary | ICD-10-CM | POA: Diagnosis not present

## 2020-09-08 NOTE — Patient Instructions (Signed)
Medication Instructions:  Your physician recommends that you continue on your current medications as directed. Please refer to the Current Medication list given to you today.  *If you need a refill on your cardiac medications before your next appointment, please call your pharmacy*   Lab Work: None If you have labs (blood work) drawn today and your tests are completely normal, you will receive your results only by: Yvette Short (if you have MyChart) OR A paper copy in the mail If you have any lab test that is abnormal or we need to change your treatment, we will call you to review the results.   Testing/Procedures: Your physician has requested that you have an echocardiogram. Echocardiography is a painless test that uses sound waves to create images of your heart. It provides your doctor with information about the size and shape of your heart and how well your heart's chambers and valves are working. This procedure takes approximately one hour. There are no restrictions for this procedure.    Follow-Up: At [facility], you and your health needs are our priority.  As part of our continuing mission to provide you with exceptional heart care, we have created designated Provider Care Teams.  These Care Teams include your primary Cardiologist (physician) and Advanced Practice Providers (APPs -  Physician Assistants and Nurse Practitioners) who all work together to provide you with the care you need, when you need it.  We recommend signing up for the patient portal called "MyChart".  Sign up information is provided on this After Visit Summary.  MyChart is used to connect with patients for Virtual Visits (Telemedicine).  Patients are able to view lab/test results, encounter notes, upcoming appointments, etc.  Non-urgent messages can be sent to your provider as well.   To learn more about what you can do with MyChart, go to [email or web address].    Your next appointment:   3  month(s)  The format for your next appointment:   In Person  Provider:   Berniece Salines, DO   Other Instructions Echocardiogram An echocardiogram is a test that uses sound waves (ultrasound) to produce images of the heart. Images from an echocardiogram can provide important information about: Heart size and shape. The size and thickness and movement of your heart's walls. Heart muscle function and strength. Heart valve function or if you have stenosis. Stenosis is when the heart valves are too narrow. If blood is flowing backward through the heart valves (regurgitation). A tumor or infectious growth around the heart valves. Areas of heart muscle that are not working well because of poor blood flow or injury from a heart attack. Aneurysm detection. An aneurysm is a weak or damaged part of an artery wall. The wall bulges out from the normal force of blood pumping through the body. Tell a health care provider about: Any allergies you have. All medicines you are taking, including vitamins, herbs, eye drops, creams, and over-the-counter medicines. Any blood disorders you have. Any surgeries you have had. Any medical conditions you have. Whether you are pregnant or may be pregnant. What are the risks? Generally, this is a safe test. However, problems may occur, including an allergic reaction to dye (contrast) that may be used during the test. What happens before the test? No specific preparation is needed. You may eat and drink normally. What happens during the test?  You will take off your clothes from the waist up and put on a hospital gown. Electrodes or electrocardiogram (ECG)patches may be placed on  your chest. The electrodes or patches are then connected to a device that monitors your heart rate and rhythm. You will lie down on a table for an ultrasound exam. A gel will be applied to your chest to help sound waves pass through your skin. A handheld device, called a transducer, will  be pressed against your chest and moved over your heart. The transducer produces sound waves that travel to your heart and bounce back (or "echo" back) to the transducer. These sound waves will be captured in real-time and changed into images of your heart that can be viewed on a video monitor. The images will be recorded on a computer and reviewed by your health care provider. You may be asked to change positions or hold your breath for a short time. This makes it easier to get different views or better views of your heart. In some cases, you may receive contrast through an IV in one of your veins. This can improve the quality of the pictures from your heart. The procedure may vary among health care providers and hospitals. What can I expect after the test? You may return to your normal, everyday life, including diet, activities, andmedicines, unless your health care provider tells you not to do that. Follow these instructions at home: It is up to you to get the results of your test. Ask your health care provider, or the department that is doing the test, when your results will be ready. Keep all follow-up visits. This is important. Summary An echocardiogram is a test that uses sound waves (ultrasound) to produce images of the heart. Images from an echocardiogram can provide important information about the size and shape of your heart, heart muscle function, heart valve function, and other possible heart problems. You do not need to do anything to prepare before this test. You may eat and drink normally. After the echocardiogram is completed, you may return to your normal, everyday life, unless your health care provider tells you not to do that. This information is not intended to replace advice given to you by your health care provider. Make sure you discuss any questions you have with your healthcare provider. Document Revised: 11/06/2019 Document Reviewed: 11/06/2019 Elsevier Patient Education   2022 [employer].

## 2020-09-08 NOTE — Progress Notes (Signed)
Cardiology Office Note:    Date:  09/08/2020   ID:  Yvette Short, DOB [birth year], MRN [id number]  PCP:  Ma Hillock, DO  Cardiologist:  Berniece Salines, DO  Electrophysiologist:  None   Referring MD: Ma Hillock, DO   No chief complaint on file. I am doing fine  History of Present Illness:    Yvette Short is a 75 y.o. female with a hx of hypertension, hyperlipidemia, hypothyroidism, moderate coronary artery disease by coronary CTA, frequent PACs and PVCs, moderate mitral regurgitation, moderate aortic regurgitation presents today for follow-up visit.   I  saw the patient on September 04, 2019 at that time we discussed her monitor results which showed frequent PACs as well as PVCs.  I did start the patient on acebutolol 200 mg twice daily. That day she also reported that she was experiencing chest pain we ordered coronary CTA which she was able to get in the interim showed moderate coronary artery disease.   I did see the patient in July 2021 at that time she was reporting significant fatigue and her blood pressure was running between 90 systolic to 790 systolic.  I did not stop her amlodipine.  In addition giving her CTA showing coronary artery disease I increased her Lipitor to 40 mg daily.   I saw the patient in October 2021 at that time we will decrease her acebutolol to 200 mg daily.    I saw the patient on June 03, 2020 at that time she appeared to be orthostatic positive.  Encouraged the patient reduce abdominal binder to help with this.  She also did have some dizziness I placed a monitor on the patient, and intermittent her monitor that showed that she had improvement with her PACs.  At that time I kept her on acebutolol 200 mg daily for  In the interim she has seen her PCP she was hypertensive and amlodipine was added to her blood pressure medication regimen. Today she has not had any significant symptoms.  She had questions about her coronary artery disease and the  aspirin use.  No other complaints at this time.  Past Medical History:  Diagnosis Date   Abnormal colonoscopy 03/2017   decreased rectal tone and polyp; 5 year recall   Abnormal findings on esophagogastroduodenoscopy (EGD) 02/25/2016   Z-line irrg. at 40 cm. No abnomrality of the esophagus to explain dysphagia. Esophagus dilated. H/o + H. Pylori   Allergy    ANXIETY 07/06/2007   Aortic atherosclerosis ([place]) 02/08/2018   Aortic valve regurgitation    Atherosclerosis of native coronary artery of native heart without angina pectoris 02/08/2018   Biceps tendonitis on right    Bronchiectasis without complication ([place]) 24/11/7351   CT 2012 IMPRESSION:  1. Apical scarring may account for the plain film abnormality.  2. A 6 mm right upper lobe pulmonary nodule.  - - HRCT 07/12/2017 >>>  Scattered minimal cylindrical and varicoid bronchiectasis with associated scattered mucoid impaction and minimal tree-in-bud opacity in both lungs, predominantly in the mid to lower lungs. Findings are stable to slightly worsened since 2015 ches   Chicken pox    Chronic cough 05/18/2017   CT chest 03/19/14  No bronchiectasis (not hrct)  Spirometry 05/18/2017  FEV1  2.15 (93%)  Ratio 76 s rx prior  - FENO 05/18/2017  =   Could not perform   - Allergy profile 05/18/2017 >  Eos 0.2 /  IgE  75 RAST pos cat > dog > mold -  HRCT 07/12/2017 >>>  See bronchiectasis - trial of dymista 07/26/2017 > some better but not consistent with it > rechallenge rx one puff each am 10/25/2017  - MCT 11/02/17 >     Colon polyp 03/31/2017   hyperplastic   COVID-19 08/04/2020   mild   Diverticulosis 03/2017   sigmoid   Genetic testing 03/26/2016   Negative genetic testing on the [facility] 30 gene panel.  The 30-Gene Cancer Panel offered by [facility] includes sequencing and/or deletion duplication testing of the following 30 genes: APC, ATM, BAP1, BARD1, BMPR1A, BRCA1, BRCA2, BRIP1, CDH1, CDK4, CDKN2A (p14ARF), CDKN2A (p16INK4a), CHEK2, EPCAM, GREM1,  MITF, MLH1, MSH2, MSH6, MUTYH, NBN, PALB2, PMS2, POLD1, POLE, PTEN, RAD51C, RAD51D, SMAD4,    GERD 11/09/2006   H. pylori infection    h/o treated wiht prevpac   Hormone replacement therapy 01/08/2019   HYPERLIPIDEMIA 11/09/2006   Hypertension    Hypothyroidism 08/03/2017   Lumbar radiculopathy 07/24/2014   Migraine    Mild depression ([place]) 02/14/2019   Pharyngoesophageal dysphagia 08/03/2017   Piriformis syndrome of right side    Spinal stenosis, lumbar region, with neurogenic claudication 10/16/2014   Epidural 10/02/2014, December 2016    Stomach ulcer 2008    Past Surgical History:  Procedure Laterality Date   CERVICAL POLYPECTOMY  01/17/2019   LIPOMA EXCISION     of back   TUBAL LIGATION      Current Medications: Current Meds  Medication Sig   acebutolol (SECTRAL) 200 MG capsule Take 1 capsule (200 mg total) by mouth daily.   amLODipine (NORVASC) 5 MG tablet Take 1 tablet (5 mg total) by mouth daily.   aspirin EC 81 MG tablet Take 81 mg by mouth daily. Swallow whole.   atorvastatin (LIPITOR) 40 MG tablet Take 1 tablet (40 mg total) by mouth daily.   Azelaic Acid 15 % cream APPLY TO AFFECTED AREA ON FACE UP TO TWO TIMES A DAY AS NEEDED   chlorpheniramine (CHLOR-TRIMETON) 4 MG tablet Take 4 mg by mouth every 4 (four) hours as needed for allergies.   co-enzyme Q-10 30 MG capsule Take 30 mg by mouth 3 (three) times daily.   Ergocalciferol (VITAMIN D2) 2000 units TABS Take 1 tablet by mouth daily.   estradiol (VIVELLE-DOT) 0.0375 MG/24HR Place 1 patch onto the skin 2 (two) times a week.   gabapentin (NEURONTIN) 100 MG capsule TAKE 2-3 CAPSULES (200-300 MG TOTAL) BY MOUTH AT BEDTIME.   ketoconazole (NIZORAL) 2 % shampoo ketoconazole 2 % shampoo  APPLY 1(ONE) APPLICATION(S) TOPICAL EVERY DAY (CAN USE ON FOREHEAD/FACE PER PROVIDER)   levothyroxine (SYNTHROID, LEVOTHROID) 88 MCG tablet Take 88 mcg by mouth daily.   losartan (COZAAR) 25 MG tablet Take 0.5 tablets (12.5 mg total) by mouth  daily.   Probiotic Product (PROBIOTIC-10 PO) Take by mouth.   progesterone (PROMETRIUM) 100 MG capsule Take by mouth.   Red Yeast Rice Extract (RED YEAST RICE PO) Take by mouth.   sucralfate (CARAFATE) 1 GM/10ML suspension Take 10 mLs (1 g total) by mouth in the morning, at noon, in the evening, and at bedtime. Every other day     Allergies:   Tetracycline hcl   Social History   Socioeconomic History   Marital status: Married    Spouse name: Not on file   Number of children: Not on file   Years of education: Not on file   Highest education level: Not on file  Occupational History   Not on file  Tobacco Use  Smoking status: Former    Packs/day: 2.50    Years: 13.00    Pack years: 32.50    Types: Cigarettes    Quit date: 07/28/1974    Years since quitting: 46.1   Smokeless tobacco: Never  Vaping Use   Vaping Use: Never used  Substance and Sexual Activity   Alcohol use: Yes    Alcohol/week: 2.0 standard drinks    Types: 2 Cans of beer per week   Drug use: No   Sexual activity: Yes    Partners: Male  Other Topics Concern   Not on file  Social History Narrative   Married. Two children.    College grad.    Former smoker.    Exercises routinely.    Drink caffeine.    Wears a hearing aid.    Smoke alarm in the home.    Wears her seatbelt.    Feels safe in her relationships.    Social Determinants of Health   Financial Resource Strain: Low Risk    Difficulty of Paying Living Expenses: Not hard at all  Food Insecurity: No Food Insecurity   Worried About Charity fundraiser in the Last Year: Never true   [place] in the Last Year: Never true  Transportation Needs: No Transportation Needs   Lack of Transportation (Medical): No   Lack of Transportation (Non-Medical): No  Physical Activity: Sufficiently Active   Days of Exercise per Week: 7 days   Minutes of Exercise per Session: 50 min  Stress: No Stress Concern Present   Feeling of Stress : Not at all   Social Connections: Moderately Integrated   Frequency of Communication with Friends and Family: More than three times a week   Frequency of Social Gatherings with Friends and Family: More than three times a week   Attends Religious Services: 1 to 4 times per year   Active Member of [employer] or Organizations: No   Attends Archivist Meetings: Never   Marital Status: Married     Family History: The patient's family history includes Breast cancer in her cousin; Cancer in an other family member; Colon cancer in her maternal uncle and paternal aunt; Colon cancer (age of onset: 62) in her father; Colon polyps in her brother; Dementia in her mother; Heart attack in her maternal grandfather; Hyperlipidemia in her brother and mother; Hypertension in her brother; Uterine cancer in her maternal aunt.  ROS:   Review of Systems  Constitution: Negative for decreased appetite, fever and weight gain.  HENT: Negative for congestion, ear discharge, hoarse voice and sore throat.   Eyes: Negative for discharge, redness, vision loss in right eye and visual halos.  Cardiovascular: Negative for chest pain, dyspnea on exertion, leg swelling, orthopnea and palpitations.  Respiratory: Negative for cough, hemoptysis, shortness of breath and snoring.   Endocrine: Negative for heat intolerance and polyphagia.  Hematologic/Lymphatic: Negative for bleeding problem. Does not bruise/bleed easily.  Skin: Negative for flushing, nail changes, rash and suspicious lesions.  Musculoskeletal: Negative for arthritis, joint pain, muscle cramps, myalgias, neck pain and stiffness.  Gastrointestinal: Negative for abdominal pain, bowel incontinence, diarrhea and excessive appetite.  Genitourinary: Negative for decreased libido, genital sores and incomplete emptying.  Neurological: Negative for brief paralysis, focal weakness, headaches and loss of balance.  Psychiatric/Behavioral: Negative for altered mental status, depression  and suicidal ideas.  Allergic/Immunologic: Negative for HIV exposure and persistent infections.    EKGs/Labs/Other Studies Reviewed:    The following studies were  reviewed today:   EKG:  The ekg ordered today demonstrates   Zio monitor The patient wore the monitor for 3 days starting June 03, 2020. Indication: Palpitations   The minimum heart rate was 47 bpm, maximum heart rate was 160 bpm, and average heart rate was 60 bpm. Predominant underlying rhythm was Sinus Rhythm.   1 run of Ventricular Tachycardia occurred lasting 4 beats with a max rate of 114 bpm (avg 110 bpm).   39 Supraventricular Tachycardia runs occurred, the run with the fastest interval lasting 9 beats with a maximum rate of 160 bpm, the longest lasting 17 beats with an average rate of 125 bpm.   Premature atrial complexes were occasional (3.4%, 8465). Premature Ventricular complexes were rare less than 1%.   No pauses, No AV block and no atrial fibrillation present. 3 patient triggered events and 6 diary events are associated with sinus rhythm and premature ventricular complex.   Conclusion: This study is remarkable for the following:                                  1.  1 run of nonsustained ventricular tachycardia.                                  2.  Paroxysmal supraventricular tachycardia which is likely atrial tachycardia with variable block.                                  3.  Occasional premature atrial complexes.  Coronary CTA - IMPRESSION: September 27, 2019 1. Coronary calcium score of 132. This was 42 percentile for age and sex matched control.   2. Normal coronary origin with right dominance.   3. Moderate Coronary Artery Disease. CADRADS 3.   Echo IMPRESSIONS July 23, 2019  1. Left ventricular ejection fraction, by estimation, is 60 to 65%. The left ventricle has normal function. The left ventricle has no regional wall motion abnormalities. Left ventricular diastolic parameters are indeterminate.    2. Right ventricular systolic function is normal. The right ventricular size is normal. There is mildly elevated pulmonary artery systolic pressure.   3. Left atrial size was mildly dilated.   4. The mitral valve is normal in structure. Moderate mitral valve regurgitation. No evidence of mitral stenosis.   5. Tricuspid valve regurgitation is mild to moderate.   6. The aortic valve is normal in structure. Aortic valve regurgitation is mild to moderate. No aortic stenosis is present.   7. The inferior vena cava is normal in size with greater than 50% respiratory variability, suggesting right atrial pressure of 3 mmHg.   Recent Labs: 06/26/2020: ALT 17; BUN 17; Creatinine, Ser 1.01; Hemoglobin 13.8; Platelets 208.0; Potassium 4.6; Sodium 142; TSH 2.82  Recent Lipid Panel    Component Value Date/Time   CHOL 147 06/26/2020 0909   TRIG 97.0 06/26/2020 0909   HDL 66.30 06/26/2020 0909   CHOLHDL 2 06/26/2020 0909   VLDL 19.4 06/26/2020 0909   LDLCALC 62 06/26/2020 0909   LDLDIRECT 158.2 05/07/2013 0928    Physical Exam:    VS:  There were no vitals taken for this visit.    Wt Readings from Last 3 Encounters:  07/28/20 128 lb (58.1 kg)  07/17/20 128 lb (58.1 kg)  06/26/20  127 lb (57.6 kg)     GEN: Well nourished, well developed in no acute distress HEENT: Normal NECK: No JVD; No carotid bruits LYMPHATICS: No lymphadenopathy CARDIAC: S1S2 noted,RRR, no murmurs, rubs, gallops RESPIRATORY:  Clear to auscultation without rales, wheezing or rhonchi  ABDOMEN: Soft, non-tender, non-distended, +bowel sounds, no guarding. EXTREMITIES: No edema, No cyanosis, no clubbing MUSCULOSKELETAL:  No deformity  SKIN: Warm and dry NEUROLOGIC:  Alert and oriented x 3, non-focal PSYCHIATRIC:  Normal affect, good insight  ASSESSMENT:    1. Essential hypertension   2. Atherosclerosis of native coronary artery of native heart without angina pectoris   3. Non-sustained ventricular tachycardia ([place])   4.  PAC (premature atrial contraction)   5. Moderate mitral regurgitation   6. Nonrheumatic aortic valve insufficiency    PLAN:     1.  Her blood pressure acceptable since the addition of her amlodipine 5 mg a day, continue the patient on her current regimen which includes amlodipine 5 mg a day, losartan 12.5 mg daily. No angina symptoms continue patient on her aspirin and atorvastatin. She does have valvular disease with moderate mitral vegetation and mild to moderate aortic regurgitation at this time it is appropriate to reassess her valve disease.  We will get an echocardiogram.  The patient is in agreement with the above plan. The patient left the office in stable condition.  The patient will follow up in 3 months   Medication Adjustments/Labs and Tests Ordered: Current medicines are reviewed at length with the patient today.  Concerns regarding medicines are outlined above.  No orders of the defined types were placed in this encounter.  No orders of the defined types were placed in this encounter.   There are no Patient Instructions on file for this visit.   Adopting a Healthy Lifestyle.  Know what a healthy weight is for you (roughly BMI <25) and aim to maintain this   Aim for 7+ servings of fruits and vegetables daily   65-80+ fluid ounces of water or unsweet tea for healthy kidneys   Limit to max 1 drink of alcohol per day; avoid smoking/tobacco   Limit animal fats in diet for cholesterol and heart health - choose grass fed whenever available   Avoid highly processed foods, and foods high in saturated/trans fats   Aim for low stress - take time to unwind and care for your mental health   Aim for 150 min of moderate intensity exercise weekly for heart health, and weights twice weekly for bone health   Aim for 7-9 hours of sleep daily   When it comes to diets, agreement about the perfect plan isnt easy to find, even among the experts. Experts at the [place] developed an idea known as the Healthy Eating Plate. Just imagine a plate divided into logical, healthy portions.   The emphasis is on diet quality:   Load up on vegetables and fruits - one-half of your plate: Aim for color and variety, and remember that potatoes dont count.   Go for whole grains - one-quarter of your plate: Whole wheat, barley, wheat berries, quinoa, oats, brown rice, and foods made with them. If you want pasta, go with whole wheat pasta.   Protein power - one-quarter of your plate: Fish, chicken, beans, and nuts are all healthy, versatile protein sources. Limit red meat.   The diet, however, does go beyond the plate, offering a few other suggestions.   Use healthy plant oils, such  as olive, canola, soy, corn, sunflower and peanut. Check the labels, and avoid partially hydrogenated oil, which have unhealthy trans fats.   If youre thirsty, drink water. Coffee and tea are good in moderation, but skip sugary drinks and limit milk and dairy products to one or two daily servings.   The type of carbohydrate in the diet is more important than the amount. Some sources of carbohydrates, such as vegetables, fruits, whole grains, and beans-are healthier than others.   Finally, stay active  Signed, Berniece Salines, DO  09/08/2020 9:43 AM    [place]

## 2020-09-19 ENCOUNTER — Ambulatory Visit (HOSPITAL_BASED_OUTPATIENT_CLINIC_OR_DEPARTMENT_OTHER)
Admission: RE | Admit: 2020-09-19 | Discharge: 2020-09-19 | Disposition: A | Discharge status: Home / Self Care | Payer: Medicare PPO | Source: Ambulatory Visit | Attending: Cardiology | Admitting: Cardiology

## 2020-09-19 ENCOUNTER — Other Ambulatory Visit: Payer: Self-pay

## 2020-09-19 DIAGNOSIS — I340 Nonrheumatic mitral (valve) insufficiency: Secondary | ICD-10-CM

## 2020-09-19 LAB — ECHOCARDIOGRAM COMPLETE
AV Mean grad: 3 mmHg
AV Peak grad: 6.2 mmHg
AV Vena cont: 0.16 cm
Ao pk vel: 1.24 m/s
Calc EF: 72.5 %
MV M vel: 5.4 m/s
MV Peak grad: 116.6 mmHg
P 1/2 time: 629 msec
Radius: 0.4 cm
S' Lateral: 2.34 cm
Single Plane A2C EF: 74.1 %
Single Plane A4C EF: 71.4 %

## 2020-09-19 NOTE — Progress Notes (Signed)
*  PRELIMINARY RESULTS* Echocardiogram 2D Echocardiogram has been performed.  Luisa Hart RDCS 09/19/2020, 10:39 AM

## 2020-10-09 ENCOUNTER — Other Ambulatory Visit: Payer: Self-pay | Admitting: Family Medicine

## 2020-10-14 ENCOUNTER — Other Ambulatory Visit: Payer: Self-pay | Admitting: Cardiology

## 2020-10-27 DIAGNOSIS — R490 Dysphonia: Secondary | ICD-10-CM | POA: Diagnosis not present

## 2020-10-27 DIAGNOSIS — R053 Chronic cough: Secondary | ICD-10-CM | POA: Diagnosis not present

## 2020-10-27 DIAGNOSIS — J383 Other diseases of vocal cords: Secondary | ICD-10-CM | POA: Diagnosis not present

## 2020-11-14 DIAGNOSIS — J383 Other diseases of vocal cords: Secondary | ICD-10-CM | POA: Diagnosis not present

## 2020-11-14 DIAGNOSIS — R053 Chronic cough: Secondary | ICD-10-CM | POA: Diagnosis not present

## 2020-11-14 DIAGNOSIS — R490 Dysphonia: Secondary | ICD-10-CM | POA: Diagnosis not present

## 2020-11-24 DIAGNOSIS — R490 Dysphonia: Secondary | ICD-10-CM | POA: Diagnosis not present

## 2020-11-24 DIAGNOSIS — R053 Chronic cough: Secondary | ICD-10-CM | POA: Diagnosis not present

## 2020-11-24 DIAGNOSIS — J383 Other diseases of vocal cords: Secondary | ICD-10-CM | POA: Diagnosis not present

## 2020-12-03 ENCOUNTER — Other Ambulatory Visit: Payer: Self-pay

## 2020-12-03 ENCOUNTER — Encounter: Payer: Self-pay | Admitting: Cardiology

## 2020-12-03 ENCOUNTER — Ambulatory Visit: Payer: Medicare PPO | Admitting: Cardiology

## 2020-12-03 VITALS — BP 124/74 | HR 56 | Ht 64.0 in | Wt 128.1 lb

## 2020-12-03 DIAGNOSIS — I10 Essential (primary) hypertension: Secondary | ICD-10-CM

## 2020-12-03 DIAGNOSIS — I491 Atrial premature depolarization: Secondary | ICD-10-CM | POA: Diagnosis not present

## 2020-12-03 DIAGNOSIS — I472 Ventricular tachycardia: Secondary | ICD-10-CM | POA: Diagnosis not present

## 2020-12-03 DIAGNOSIS — I061 Rheumatic aortic insufficiency: Secondary | ICD-10-CM | POA: Diagnosis not present

## 2020-12-03 DIAGNOSIS — I4729 Other ventricular tachycardia: Secondary | ICD-10-CM

## 2020-12-03 DIAGNOSIS — I340 Nonrheumatic mitral (valve) insufficiency: Secondary | ICD-10-CM | POA: Diagnosis not present

## 2020-12-03 DIAGNOSIS — I2510 Atherosclerotic heart disease of native coronary artery without angina pectoris: Secondary | ICD-10-CM

## 2020-12-03 NOTE — Patient Instructions (Signed)
Medication Instructions:  Your physician recommends that you continue on your current medications as directed. Please refer to the Current Medication list given to you today.   *If you need a refill on your cardiac medications before your next appointment, please call your pharmacy*   Lab Work: None If you have labs (blood work) drawn today and your tests are completely normal, you will receive your results only by: MyChart Message (if you have MyChart) OR A paper copy in the mail If you have any lab test that is abnormal or we need to change your treatment, we will call you to review the results.   Testing/Procedures: None   Follow-Up: At [facility], you and your health needs are our priority.  As part of our continuing mission to provide you with exceptional heart care, we have created designated Provider Care Teams.  These Care Teams include your primary Cardiologist (physician) and Advanced Practice Providers (APPs -  Physician Assistants and Nurse Practitioners) who all work together to provide you with the care you need, when you need it.  We recommend signing up for the patient portal called "MyChart".  Sign up information is provided on this After Visit Summary.  MyChart is used to connect with patients for Virtual Visits (Telemedicine).  Patients are able to view lab/test results, encounter notes, upcoming appointments, etc.  Non-urgent messages can be sent to your provider as well.   To learn more about what you can do with MyChart, go to [email or web address].    Your next appointment:   1 year(s)  The format for your next appointment:   In Person  Provider:   Kardie Tobb, DO [address], [place], [place] [address]    Other Instructions   

## 2020-12-03 NOTE — Progress Notes (Signed)
Cardiology Office Note:    Date:  12/03/2020   ID:  Yvette Short, DOB 09-15-1945, MRN [id number]  PCP:  Yvette Hillock, DO  Cardiologist:  Yvette Salines, DO  Electrophysiologist:  None   Referring MD: Yvette Hillock, DO   " I am doing well"  History of Present Illness:    Yvette Short is a 75 y.o. female with a hx of  hypertension, hyperlipidemia, hypothyroidism, moderate coronary artery disease by coronary CTA, frequent PACs and PVCs, moderate mitral regurgitation, moderate aortic regurgitation presents today for follow-up visit.   I  saw the patient on September 04, 2019 at that time we discussed her monitor results which showed frequent PACs as well as PVCs.  I did start the patient on acebutolol 200 mg twice daily. That day she also reported that she was experiencing chest pain we ordered coronary CTA which she was able to get in the interim showed moderate coronary artery disease.   I did see the patient in July 2021 at that time she was reporting significant fatigue and her blood pressure was running between 90 systolic to 546 systolic.  I did not stop her amlodipine.  In addition giving her CTA showing coronary artery disease I increased her Lipitor to 40 mg daily.   I saw the patient in October 2021 at that time we will decrease her acebutolol to 200 mg daily.     I saw the patient on June 03, 2020 at that time she appeared to be orthostatic positive.  Encouraged the patient reduce abdominal binder to help with this.  She also did have some dizziness I placed a monitor on the patient, and intermittent her monitor that showed that she had improvement with her PACs.    I saw the patient on September 08, 2020 at that time she was stable from a CV standpoint.  We repeated her echocardiogram to assess her moderate mitral regurgitation.  She is here today for follow-up visit.  She appears to be doing well from a cardiovascular standpoint.  Since I saw the patient she has seen a  physician at [facility] for her chronic cough and is under treatment and seems that this is really improving her cough.  Some questions about her echocardiogram which I was able to answer for her in the office today.  Past Medical History:  Diagnosis Date   Abnormal colonoscopy 03/2017   decreased rectal tone and polyp; 5 year recall   Abnormal findings on esophagogastroduodenoscopy (EGD) 02/25/2016   Z-line irrg. at 40 cm. No abnomrality of the esophagus to explain dysphagia. Esophagus dilated. H/o + H. Pylori   Allergy    ANXIETY 07/06/2007   Aortic atherosclerosis ([place]) 02/08/2018   Aortic valve regurgitation    Atherosclerosis of native coronary artery of native heart without angina pectoris 02/08/2018   Biceps tendonitis on right    Bronchiectasis without complication ([place]) 27/05/5007   CT 2012 IMPRESSION:  1. Apical scarring may account for the plain film abnormality.  2. A 6 mm right upper lobe pulmonary nodule.  - - HRCT 07/12/2017 >>>  Scattered minimal cylindrical and varicoid bronchiectasis with associated scattered mucoid impaction and minimal tree-in-bud opacity in both lungs, predominantly in the mid to lower lungs. Findings are stable to slightly worsened since 2015 ches   Chicken pox    Chronic cough 05/18/2017   CT chest 03/19/14  No bronchiectasis (not hrct)  Spirometry 05/18/2017  FEV1  2.15 (93%)  Ratio 76 s rx  prior  - FENO 05/18/2017  =   Could not perform   - Allergy profile 05/18/2017 >  Eos 0.2 /  IgE  75 RAST pos cat > dog > mold - HRCT 07/12/2017 >>>  See bronchiectasis - trial of dymista 07/26/2017 > some better but not consistent with it > rechallenge rx one puff each am 10/25/2017  - MCT 11/02/17 >     Colon polyp 03/31/2017   hyperplastic   COVID-19 08/04/2020   mild   Diverticulosis 03/2017   sigmoid   Genetic testing 03/26/2016   Negative genetic testing on the [facility] 30 gene panel.  The 30-Gene Cancer Panel offered by [facility] includes sequencing and/or deletion  duplication testing of the following 30 genes: APC, ATM, BAP1, BARD1, BMPR1A, BRCA1, BRCA2, BRIP1, CDH1, CDK4, CDKN2A (p14ARF), CDKN2A (p16INK4a), CHEK2, EPCAM, GREM1, MITF, MLH1, MSH2, MSH6, MUTYH, NBN, PALB2, PMS2, POLD1, POLE, PTEN, RAD51C, RAD51D, SMAD4,    GERD 11/09/2006   H. pylori infection    h/o treated wiht prevpac   Hormone replacement therapy 01/08/2019   HYPERLIPIDEMIA 11/09/2006   Hypertension    Hypothyroidism 08/03/2017   Lumbar radiculopathy 07/24/2014   Migraine    Mild depression ([place]) 02/14/2019   Pharyngoesophageal dysphagia 08/03/2017   Piriformis syndrome of right side    Spinal stenosis, lumbar region, with neurogenic claudication 10/16/2014   Epidural 10/02/2014, December 2016    Stomach ulcer 2008    Past Surgical History:  Procedure Laterality Date   CERVICAL POLYPECTOMY  01/17/2019   LIPOMA EXCISION     of back   TUBAL LIGATION      Current Medications: Current Meds  Medication Sig   acebutolol (SECTRAL) 200 MG capsule Take 1 capsule (200 mg total) by mouth daily.   amLODipine (NORVASC) 5 MG tablet Take 1 tablet (5 mg total) by mouth daily.   aspirin EC 81 MG tablet Take 81 mg by mouth daily. Swallow whole.   atorvastatin (LIPITOR) 40 MG tablet TAKE 1 TABLET BY MOUTH EVERY DAY   Azelaic Acid 15 % cream APPLY TO AFFECTED AREA ON FACE UP TO TWO TIMES A DAY AS NEEDED   chlorpheniramine (CHLOR-TRIMETON) 4 MG tablet Take 4 mg by mouth every 4 (four) hours as needed for allergies.   Ergocalciferol (VITAMIN D2) 2000 units TABS Take 1 tablet by mouth daily.   estradiol (VIVELLE-DOT) 0.0375 MG/24HR Place 1 patch onto the skin 2 (two) times a week.   gabapentin (NEURONTIN) 100 MG capsule TAKE 2-3 CAPSULES (200-300 MG TOTAL) BY MOUTH AT BEDTIME.   levothyroxine (SYNTHROID, LEVOTHROID) 88 MCG tablet Take 88 mcg by mouth daily.   losartan (COZAAR) 25 MG tablet Take 0.5 tablets (12.5 mg total) by mouth daily.   Probiotic Product (PROBIOTIC-10 PO) Take by mouth.    progesterone (PROMETRIUM) 100 MG capsule Take by mouth.     Allergies:   Tetracycline hcl   Social History   Socioeconomic History   Marital status: Married    Spouse name: Not on file   Number of children: Not on file   Years of education: Not on file   Highest education level: Not on file  Occupational History   Not on file  Tobacco Use   Smoking status: Former    Packs/day: 2.50    Years: 13.00    Pack years: 32.50    Types: Cigarettes    Quit date: 07/28/1974    Years since quitting: 46.3   Smokeless tobacco: Never  Vaping Use  Vaping Use: Never used  Substance and Sexual Activity   Alcohol use: Yes    Alcohol/week: 2.0 standard drinks    Types: 2 Cans of beer per week   Drug use: No   Sexual activity: Yes    Partners: Male  Other Topics Concern   Not on file  Social History Narrative   Married. Two children.    College grad.    Former smoker.    Exercises routinely.    Drink caffeine.    Wears a hearing aid.    Smoke alarm in the home.    Wears her seatbelt.    Feels safe in her relationships.    Social Determinants of Health   Financial Resource Strain: Low Risk    Difficulty of Paying Living Expenses: Not hard at all  Food Insecurity: No Food Insecurity   Worried About Charity fundraiser in the Last Year: Never true   [place] in the Last Year: Never true  Transportation Needs: No Transportation Needs   Lack of Transportation (Medical): No   Lack of Transportation (Non-Medical): No  Physical Activity: Sufficiently Active   Days of Exercise per Week: 7 days   Minutes of Exercise per Session: 50 min  Stress: No Stress Concern Present   Feeling of Stress : Not at all  Social Connections: Moderately Integrated   Frequency of Communication with Friends and Family: More than three times a week   Frequency of Social Gatherings with Friends and Family: More than three times a week   Attends Religious Services: 1 to 4 times per year   Active  Member of [employer] or Organizations: No   Attends Archivist Meetings: Never   Marital Status: Married     Family History: The patient's family history includes Breast cancer in her cousin; Cancer in an other family member; Colon cancer in her maternal uncle and paternal aunt; Colon cancer (age of onset: 76) in her father; Colon polyps in her brother; Dementia in her mother; Heart attack in her maternal grandfather; Hyperlipidemia in her brother and mother; Hypertension in her brother; Uterine cancer in her maternal aunt.  ROS:   Review of Systems  Constitution: Negative for decreased appetite, fever and weight gain.  HENT: Negative for congestion, ear discharge, hoarse voice and sore throat.   Eyes: Negative for discharge, redness, vision loss in right eye and visual halos.  Cardiovascular: Negative for chest pain, dyspnea on exertion, leg swelling, orthopnea and palpitations.  Respiratory: Negative for cough, hemoptysis, shortness of breath and snoring.   Endocrine: Negative for heat intolerance and polyphagia.  Hematologic/Lymphatic: Negative for bleeding problem. Does not bruise/bleed easily.  Skin: Negative for flushing, nail changes, rash and suspicious lesions.  Musculoskeletal: Negative for arthritis, joint pain, muscle cramps, myalgias, neck pain and stiffness.  Gastrointestinal: Negative for abdominal pain, bowel incontinence, diarrhea and excessive appetite.  Genitourinary: Negative for decreased libido, genital sores and incomplete emptying.  Neurological: Negative for brief paralysis, focal weakness, headaches and loss of balance.  Psychiatric/Behavioral: Negative for altered mental status, depression and suicidal ideas.  Allergic/Immunologic: Negative for HIV exposure and persistent infections.    EKGs/Labs/Other Studies Reviewed:    The following studies were reviewed today:   EKG:  The ekg ordered today demonstrates sinus bradycardia, heart rate 56 bpm.  TTE  09/19/2020 IMPRESSIONS   1. Left ventricular ejection fraction, by estimation, is 60 to 65%. The  left ventricle has normal function. The left ventricle has no regional  wall motion abnormalities. Left ventricular diastolic function could not  be evaluated.   2. Right ventricular systolic function is normal. The right ventricular  size is normal. There is moderately elevated pulmonary artery systolic  pressure.   3. Vena Contracta -0.29, ERO 0.06 cm2, MR Volume 11 ml. The mitral valve  is normal in structure. Mild mitral valve regurgitation. No evidence of  mitral stenosis.   4. The aortic valve is normal in structure. Aortic valve regurgitation is  mild. No aortic stenosis is present.   5. The inferior vena cava is normal in size with greater than 50%  respiratory variability, suggesting right atrial pressure of 3 mmHg.   FINDINGS   Left Ventricle: Left ventricular ejection fraction, by estimation, is 60  to 65%. The left ventricle has normal function. The left ventricle has no  regional wall motion abnormalities. The left ventricular internal cavity  size was normal in size. There is   no left ventricular hypertrophy. Left ventricular diastolic function  could not be evaluated.   Right Ventricle: The right ventricular size is normal. No increase in  right ventricular wall thickness. Right ventricular systolic function is  normal. There is moderately elevated pulmonary artery systolic pressure.  The tricuspid regurgitant velocity is  3.36 m/s, and with an assumed right atrial pressure of 3 mmHg, the  estimated right ventricular systolic pressure is 07.1 mmHg.   Left Atrium: Left atrial size was normal in size.   Right Atrium: Right atrial size was normal in size.   Pericardium: There is no evidence of pericardial effusion.   Mitral Valve: Vena Contracta -0.29, ERO 0.06 cm2, MR Volume 11 ml. The  mitral valve is normal in structure. Mild mitral valve regurgitation. No  evidence  of mitral valve stenosis.   Tricuspid Valve: The tricuspid valve is normal in structure. Tricuspid  valve regurgitation is mild . No evidence of tricuspid stenosis.   Aortic Valve: The aortic valve is normal in structure. Aortic valve  regurgitation is mild. Aortic regurgitation PHT measures 629 msec. No  aortic stenosis is present. Aortic valve mean gradient measures 3.0 mmHg.  Aortic valve peak gradient measures 6.2  mmHg.   Pulmonic Valve: The pulmonic valve was normal in structure. Pulmonic valve  regurgitation is not visualized. No evidence of pulmonic stenosis.   Aorta: The aortic root is normal in size and structure.   Venous: The inferior vena cava is normal in size with greater than 50%  respiratory variability, suggesting right atrial pressure of 3 mmHg.   IAS/Shunts: No atrial level shunt detected by color flow Doppler.   Zio monitor The patient wore the monitor for 3 days starting June 03, 2020. Indication: Palpitations   The minimum heart rate was 47 bpm, maximum heart rate was 160 bpm, and average heart rate was 60 bpm. Predominant underlying rhythm was Sinus Rhythm.   1 run of Ventricular Tachycardia occurred lasting 4 beats with a max rate of 114 bpm (avg 110 bpm).   39 Supraventricular Tachycardia runs occurred, the run with the fastest interval lasting 9 beats with a maximum rate of 160 bpm, the longest lasting 17 beats with an average rate of 125 bpm.   Premature atrial complexes were occasional (3.4%, 8465). Premature Ventricular complexes were rare less than 1%.   No pauses, No AV block and no atrial fibrillation present. 3 patient triggered events and 6 diary events are associated with sinus rhythm and premature ventricular complex.   Conclusion: This study is remarkable for  the following:                                  1.  1 run of nonsustained ventricular tachycardia.                                  2.  Paroxysmal supraventricular tachycardia which  is likely atrial tachycardia with variable block.                                  3.  Occasional premature atrial complexes.   Coronary CTA - IMPRESSION: September 27, 2019 1. Coronary calcium score of 132. This was 76 percentile for age and sex matched control.   2. Normal coronary origin with right dominance.   3. Moderate Coronary Artery Disease. CADRADS 3.     Recent Labs: 06/26/2020: ALT 17; BUN 17; Creatinine, Ser 1.01; Hemoglobin 13.8; Platelets 208.0; Potassium 4.6; Sodium 142; TSH 2.82  Recent Lipid Panel    Component Value Date/Time   CHOL 147 06/26/2020 0909   TRIG 97.0 06/26/2020 0909   HDL 66.30 06/26/2020 0909   CHOLHDL 2 06/26/2020 0909   VLDL 19.4 06/26/2020 0909   LDLCALC 62 06/26/2020 0909   LDLDIRECT 158.2 05/07/2013 0928    Physical Exam:    VS:  BP 124/74 (BP Location: Right Arm, Patient Position: Sitting, Cuff Size: Normal)   Pulse (!) 56   Ht '5\' 4"'  (1.626 m)   Wt 128 lb 1.3 oz (58.1 kg)   SpO2 98%   BMI 21.98 kg/m     Wt Readings from Last 3 Encounters:  12/03/20 128 lb 1.3 oz (58.1 kg)  07/28/20 128 lb (58.1 kg)  07/17/20 128 lb (58.1 kg)     GEN: Well nourished, well developed in no acute distress HEENT: Normal NECK: No JVD; No carotid bruits LYMPHATICS: No lymphadenopathy CARDIAC: S1S2 noted,RRR, no murmurs, rubs, gallops RESPIRATORY:  Clear to auscultation without rales, wheezing or rhonchi  ABDOMEN: Soft, non-tender, non-distended, +bowel sounds, no guarding. EXTREMITIES: No edema, No cyanosis, no clubbing MUSCULOSKELETAL:  No deformity  SKIN: Warm and dry NEUROLOGIC:  Alert and oriented x 3, non-focal PSYCHIATRIC:  Normal affect, good insight  ASSESSMENT:    1. Frequent PAC (premature atrial contraction)   2. Essential hypertension   3. Atherosclerosis of native coronary artery of native heart without angina pectoris   4. Non-sustained ventricular tachycardia ([place])   5. Rheumatic aortic valve insufficiency   6. Nonrheumatic mitral  valve regurgitation    PLAN:     1.  She appeared to be doing well from a cardiovascular standpoint.  No medication changes will be made.  Have answered all her questions as it relates to her recent testing. 2.  Continue patient on her acebutolol for her frequent PACs. 3.  Blood pressure acceptable at this time.  The patient is in agreement with the above plan. The patient left the office in stable condition.  The patient will follow up in 1 year or sooner if needed.   Medication Adjustments/Labs and Tests Ordered: Current medicines are reviewed at length with the patient today.  Concerns regarding medicines are outlined above.  Orders Placed This Encounter  Procedures   EKG 12-Lead   No orders of the defined types were placed in this encounter.  Patient Instructions  Medication Instructions:  Your physician recommends that you continue on your current medications as directed. Please refer to the Current Medication list given to you today.  *If you need a refill on your cardiac medications before your next appointment, please call your pharmacy*   Lab Work: None If you have labs (blood work) drawn today and your tests are completely normal, you will receive your results only by: [place] (if you have MyChart) OR A paper copy in the mail If you have any lab test that is abnormal or we need to change your treatment, we will call you to review the results.   Testing/Procedures: None   Follow-Up: At [facility], you and your health needs are our priority.  As part of our continuing mission to provide you with exceptional heart care, we have created designated Provider Care Teams.  These Care Teams include your primary Cardiologist (physician) and Advanced Practice Providers (APPs -  Physician Assistants and Nurse Practitioners) who all work together to provide you with the care you need, when you need it.  We recommend signing up for the patient portal called  "MyChart".  Sign up information is provided on this After Visit Summary.  MyChart is used to connect with patients for Virtual Visits (Telemedicine).  Patients are able to view lab/test results, encounter notes, upcoming appointments, etc.  Non-urgent messages can be sent to your provider as well.   To learn more about what you can do with MyChart, go to [email or web address].    Your next appointment:   1 year(s)  The format for your next appointment:   In Person  Provider:   Berniece Salines, DO [address], [place], [place] [address]    Other Instructions     Adopting a Healthy Lifestyle.  Know what a healthy weight is for you (roughly BMI <25) and aim to maintain this   Aim for 7+ servings of fruits and vegetables daily   65-80+ fluid ounces of water or unsweet tea for healthy kidneys   Limit to max 1 drink of alcohol per day; avoid smoking/tobacco   Limit animal fats in diet for cholesterol and heart health - choose grass fed whenever available   Avoid highly processed foods, and foods high in saturated/trans fats   Aim for low stress - take time to unwind and care for your mental health   Aim for 150 min of moderate intensity exercise weekly for heart health, and weights twice weekly for bone health   Aim for 7-9 hours of sleep daily   When it comes to diets, agreement about the perfect plan isnt easy to find, even among the experts. Experts at the [employer] developed an idea known as the Healthy Eating Plate. Just imagine a plate divided into logical, healthy portions.   The emphasis is on diet quality:   Load up on vegetables and fruits - one-half of your plate: Aim for color and variety, and remember that potatoes dont count.   Go for whole grains - one-quarter of your plate: Whole wheat, barley, wheat berries, quinoa, oats, brown rice, and foods made with them. If you want pasta, go with whole wheat pasta.   Protein power -  one-quarter of your plate: Fish, chicken, beans, and nuts are all healthy, versatile protein sources. Limit red meat.   The diet, however, does go beyond the plate, offering a few other suggestions.   Use healthy plant oils, such as olive, canola,  soy, corn, sunflower and peanut. Check the labels, and avoid partially hydrogenated oil, which have unhealthy trans fats.   If youre thirsty, drink water. Coffee and tea are good in moderation, but skip sugary drinks and limit milk and dairy products to one or two daily servings.   The type of carbohydrate in the diet is more important than the amount. Some sources of carbohydrates, such as vegetables, fruits, whole grains, and beans-are healthier than others.   Finally, stay active  Signed, Yvette Salines, DO  12/03/2020 9:00 AM    [place]

## 2020-12-15 ENCOUNTER — Ambulatory Visit: Payer: Medicare PPO | Admitting: Family Medicine

## 2021-01-02 DIAGNOSIS — R053 Chronic cough: Secondary | ICD-10-CM | POA: Diagnosis not present

## 2021-01-02 DIAGNOSIS — J383 Other diseases of vocal cords: Secondary | ICD-10-CM | POA: Diagnosis not present

## 2021-01-02 DIAGNOSIS — R490 Dysphonia: Secondary | ICD-10-CM | POA: Diagnosis not present

## 2021-01-05 DIAGNOSIS — R490 Dysphonia: Secondary | ICD-10-CM | POA: Diagnosis not present

## 2021-01-05 DIAGNOSIS — R053 Chronic cough: Secondary | ICD-10-CM | POA: Diagnosis not present

## 2021-01-05 DIAGNOSIS — J383 Other diseases of vocal cords: Secondary | ICD-10-CM | POA: Diagnosis not present

## 2021-01-07 ENCOUNTER — Other Ambulatory Visit: Payer: Self-pay

## 2021-01-07 ENCOUNTER — Ambulatory Visit: Payer: Medicare PPO | Admitting: Family Medicine

## 2021-01-07 ENCOUNTER — Encounter: Payer: Self-pay | Admitting: Family Medicine

## 2021-01-07 VITALS — BP 119/69 | HR 59 | Temp 98.3°F | Wt 125.8 lb

## 2021-01-07 DIAGNOSIS — E782 Mixed hyperlipidemia: Secondary | ICD-10-CM

## 2021-01-07 DIAGNOSIS — I10 Essential (primary) hypertension: Secondary | ICD-10-CM | POA: Diagnosis not present

## 2021-01-07 DIAGNOSIS — E039 Hypothyroidism, unspecified: Secondary | ICD-10-CM | POA: Diagnosis not present

## 2021-01-07 DIAGNOSIS — I700 Atherosclerosis of aorta: Secondary | ICD-10-CM | POA: Diagnosis not present

## 2021-01-07 DIAGNOSIS — I061 Rheumatic aortic insufficiency: Secondary | ICD-10-CM | POA: Diagnosis not present

## 2021-01-07 DIAGNOSIS — I2510 Atherosclerotic heart disease of native coronary artery without angina pectoris: Secondary | ICD-10-CM

## 2021-01-07 DIAGNOSIS — I351 Nonrheumatic aortic (valve) insufficiency: Secondary | ICD-10-CM

## 2021-01-07 MED ORDER — AMLODIPINE BESYLATE 5 MG PO TABS: 5.0000 mg | ORAL_TABLET | Freq: Every day | ORAL | 1 refills | Status: DC

## 2021-01-07 MED ORDER — LOSARTAN POTASSIUM 25 MG PO TABS: 12.5000 mg | ORAL_TABLET | Freq: Every day | ORAL | 1 refills | Status: DC

## 2021-01-07 NOTE — Progress Notes (Signed)
This visit occurred during the SARS-CoV-2 public health emergency.  Safety protocols were in place, including screening questions prior to the visit, additional usage of staff PPE, and extensive cleaning of exam room while observing appropriate contact time as indicated for disinfecting solutions.    Yvette Short , Aug 09, 1945, 75 y.o., female MRN: [id number] Patient Care Team    Relationship Specialty Notifications Start End  Yvette Hillock, DO PCP - General Family Medicine  08/03/17   Yvette Salines, DO PCP - Cardiology Cardiology  09/04/19   Yvette Queen, MD Consulting Physician Obstetrics and Gynecology  08/03/17   Yvette Rockers, MD Consulting Physician Pulmonary Disease  08/03/17   Yvette Pulley, DO Consulting Physician Sports Medicine  08/03/17   Yvette Miyamoto, MD Referring Physician Gastroenterology  08/03/17   Yvette Short, Rebecca  Optometry  08/03/17     Chief Complaint  Patient presents with   Follow-up    Pt is fasting   Medication Refill     Subjective: Yvette Short is a 75 y.o. female present for cmc HTN/Mixed hyperlipidemia/aortic atherosclerosis/AR/MR/CAD Pt reports compliance with lisinopril 12.5 mg qd and amlodipine 5 mg qd. Blood pressures ranges at home. Patient denies chest pain, shortness of breath or lower extremity edema. RF: [id number] CAD 08/30/2020-echo: Moderate mitral regurgitation, mild to moderate aortic regurgitation, mild to moderate tricuspid regurgitation. The right-sided pressures - mildly elevated.  Hypothyroidism, unspecified type Managed by gynecology  Depression screen [facility] 2/9 01/07/2021 06/26/2020 05/07/2020 12/25/2019 02/07/2019  Decreased Interest 0 0 0 1 1  Down, Depressed, Hopeless 0 1 0 0 0  PHQ - 2 Score 0 1 0 1 1  Altered sleeping 0 1 - 0 -  Tired, decreased energy 0 1 - 2 -  Change in appetite 0 2 - 1 -  Feeling bad or failure about yourself  0 1 - 0 -  Trouble concentrating 0 0 - 0 -  Moving slowly or fidgety/restless 0 0  - 0 -  Suicidal thoughts 0 0 - 0 -  PHQ-9 Score 0 6 - 4 -    Allergies  Allergen Reactions   Tetracycline Hcl Rash        Social History   Social History Narrative   Married. Two children.    College grad.    Former smoker.    Exercises routinely.    Drink caffeine.    Wears a hearing aid.    Smoke alarm in the home.    Wears her seatbelt.    Feels safe in her relationships.    Past Medical History:  Diagnosis Date   Abnormal colonoscopy 03/2017   decreased rectal tone and polyp; 5 year recall   Abnormal findings on esophagogastroduodenoscopy (EGD) 02/25/2016   Z-line irrg. at 40 cm. No abnomrality of the esophagus to explain dysphagia. Esophagus dilated. H/o + H. Pylori   Allergy    ANXIETY 07/06/2007   Aortic atherosclerosis ([place]) 02/08/2018   Aortic valve regurgitation    Atherosclerosis of native coronary artery of native heart without angina pectoris 02/08/2018   Biceps tendonitis on right    Bronchiectasis without complication ([place]) 40/98/1191   CT 2012 IMPRESSION:  1. Apical scarring may account for the plain film abnormality.  2. A 6 mm right upper lobe pulmonary nodule.  - - HRCT 07/12/2017 >>>  Scattered minimal cylindrical and varicoid bronchiectasis with associated scattered mucoid impaction and minimal tree-in-bud opacity in both lungs, predominantly in the mid to lower lungs.  Findings are stable to slightly worsened since 2015 ches   Chicken pox    Chronic cough 05/18/2017   CT chest 03/19/14  No bronchiectasis (not hrct)  Spirometry 05/18/2017  FEV1  2.15 (93%)  Ratio 76 s rx prior  - FENO 05/18/2017  =   Could not perform   - Allergy profile 05/18/2017 >  Eos 0.2 /  IgE  75 RAST pos cat > dog > mold - HRCT 07/12/2017 >>>  See bronchiectasis - trial of dymista 07/26/2017 > some better but not consistent with it > rechallenge rx one puff each am 10/25/2017  - MCT 11/02/17 >     Colon polyp 03/31/2017   hyperplastic   COVID-19 08/04/2020   mild   Diverticulosis 03/2017    sigmoid   Genetic testing 03/26/2016   Negative genetic testing on the [facility] 30 gene panel.  The 30-Gene Cancer Panel offered by [facility] includes sequencing and/or deletion duplication testing of the following 30 genes: APC, ATM, BAP1, BARD1, BMPR1A, BRCA1, BRCA2, BRIP1, CDH1, CDK4, CDKN2A (p14ARF), CDKN2A (p16INK4a), CHEK2, EPCAM, GREM1, MITF, MLH1, MSH2, MSH6, MUTYH, NBN, PALB2, PMS2, POLD1, POLE, PTEN, RAD51C, RAD51D, SMAD4,    GERD 11/09/2006   H. pylori infection    h/o treated wiht prevpac   Hormone replacement therapy 01/08/2019   HYPERLIPIDEMIA 11/09/2006   Hypertension    Hypothyroidism 08/03/2017   Lumbar radiculopathy 07/24/2014   Migraine    Mild depression 02/14/2019   Pharyngoesophageal dysphagia 08/03/2017   Piriformis syndrome of right side    Spinal stenosis, lumbar region, with neurogenic claudication 10/16/2014   Epidural 10/02/2014, December 2016    Stomach ulcer 2008   Past Surgical History:  Procedure Laterality Date   CERVICAL POLYPECTOMY  01/17/2019   LIPOMA EXCISION     of back   TUBAL LIGATION     Family History  Problem Relation Age of Onset   Dementia Mother    Hyperlipidemia Mother    Colon cancer Father 70   Colon polyps Brother        gets colonoscopy every 2-3 years   Hyperlipidemia Brother    Hypertension Brother    Uterine cancer Maternal Aunt        dx in her 72s   Colon cancer Maternal Uncle    Colon cancer Paternal Aunt    Heart attack Maternal Grandfather    Cancer Other        MGMs sister - "abdominal cancer"   Breast cancer Cousin    Allergies as of 01/07/2021       Reactions   Tetracycline Hcl Rash            Medication List        Accurate as of January 07, 2021  8:51 AM. If you have any questions, ask your nurse or doctor.          acebutolol 200 MG capsule Commonly known as: SECTRAL Take 1 capsule (200 mg total) by mouth daily.   amLODipine 5 MG tablet Commonly known as: NORVASC Take 1 tablet (5 mg total)  by mouth daily.   aspirin EC 81 MG tablet Take 81 mg by mouth daily. Swallow whole.   atorvastatin 40 MG tablet Commonly known as: LIPITOR TAKE 1 TABLET BY MOUTH EVERY DAY   Azelaic Acid 15 % gel APPLY TO AFFECTED AREA ON FACE UP TO TWO TIMES A DAY AS NEEDED   chlorpheniramine 4 MG tablet Commonly known as: CHLOR-TRIMETON Take 4 mg by mouth  every 4 (four) hours as needed for allergies.   estradiol 0.0375 MG/24HR Commonly known as: VIVELLE-DOT Place 1 patch onto the skin 2 (two) times a week.   gabapentin 100 MG capsule Commonly known as: NEURONTIN TAKE 2-3 CAPSULES (200-300 MG TOTAL) BY MOUTH AT BEDTIME.   levothyroxine 88 MCG tablet Commonly known as: SYNTHROID Take 88 mcg by mouth daily.   losartan 25 MG tablet Commonly known as: COZAAR Take 0.5 tablets (12.5 mg total) by mouth daily.   PROBIOTIC-10 PO Take by mouth.   progesterone 100 MG capsule Commonly known as: PROMETRIUM Take by mouth.   Vitamin D2 50 MCG (2000 UT) Tabs Take 1 tablet by mouth daily.        All past medical history, surgical history, allergies, family history, immunizations andmedications were updated in the EMR today and reviewed under the history and medication portions of their EMR.     ROS: Negative, with the exception of above mentioned in HPI   Objective:  BP 119/69   Pulse (!) 59   Temp 98.3 F (36.8 C)   Wt 125 lb 12.8 oz (57.1 kg)   SpO2 96%   BMI 21.59 kg/m  Body mass index is 21.59 kg/m. Gen: Afebrile. No acute distress.  HENT: AT. [place]. MMM. Eyes:Pupils Equal Round Reactive to light, Extraocular movements intact,  Conjunctiva without redness, discharge or icterus. CV: RRR no murmur, no edema, +2/4 P posterior tibialis pulses Chest: CTAB, no wheeze or crackles Skin: no rashes, purpura or petechiae.  Neuro: Normal gait. PERLA. EOMi. Alert. Oriented x3 Psych: Normal affect, dress and demeanor. Normal speech. Normal thought content and judgment.   No results  found. No results found. No results found for this or any previous visit (from the past 24 hour(s)).  Assessment/Plan: ANALIYA PORCO is a 75 y.o. female present for OV for  HTN/Mixed hyperlipidemia/aortic atherosclerosis/AR/MR/CAD Stable.  Continue  amlodipine 5 mg daily Continue losartan 12.5 mg daily Continue sectral, lipitor prescribed by cardiology.  Continue baby asa F/u 5.5 mos for cpe/cmc/labs    Reviewed expectations re: course of current medical issues. Discussed self-management of symptoms. Outlined signs and symptoms indicating need for more acute intervention. Patient verbalized understanding and all questions were answered. Patient received an After-Visit Summary.    No orders of the defined types were placed in this encounter.  No orders of the defined types were placed in this encounter.  Referral Orders  No referral(s) requested today     Note is dictated utilizing voice recognition software. Although note has been proof read prior to signing, occasional typographical errors still can be missed. If any questions arise, please do not hesitate to call for verification.   electronically signed by:  Howard Pouch, DO  [place]

## 2021-01-11 ENCOUNTER — Encounter: Payer: Self-pay | Admitting: Family Medicine

## 2021-01-12 NOTE — Telephone Encounter (Signed)
FYI

## 2021-01-16 ENCOUNTER — Ambulatory Visit: Payer: Self-pay

## 2021-01-16 ENCOUNTER — Ambulatory Visit: Payer: Medicare PPO | Admitting: Family Medicine

## 2021-01-16 ENCOUNTER — Other Ambulatory Visit: Payer: Self-pay

## 2021-01-16 ENCOUNTER — Ambulatory Visit (INDEPENDENT_AMBULATORY_CARE_PROVIDER_SITE_OTHER): Payer: Medicare PPO

## 2021-01-16 VITALS — BP 128/86 | HR 44 | Ht 64.0 in | Wt 127.4 lb

## 2021-01-16 DIAGNOSIS — M25511 Pain in right shoulder: Secondary | ICD-10-CM

## 2021-01-16 NOTE — Progress Notes (Signed)
I, Yvette Short, LAT, ATC, am serving as scribe for Dr. Lynne Leader.  Yvette Short is a 75 y.o. female who presents to [place] at [facility] today for R shoulder pain.  She was last seen by Dr. Tamala Julian on 07/17/20 for B shoulder and low back pain.  Today, pt reports increased R shoulder pain ongoing for 2 weeks. MOI: Pt was at the gym, she was lifting a slanted barbell and after the 3rd lift she had pain in her R shoulder She locates her pain to anterior aspect of the R shoulder and proximal biceps. She has had recurrent issues with this right shoulder.  She has had trials of physical therapy in the past and trial of injection in the past.  Her goal is to return to weightlifting.  Radiating pain: yes- into humerus and all over [facility] joint Neck pain: no Aggravating factors: ER, ABD, ext Treatments tried: HEP, Voltaren gel,   Diagnostic imaging: C-spine XR- 07/17/20  Pertinent review of systems: No fevers or chills  Relevant historical information: Valvular heart disease.   Exam:  BP 128/86   Pulse (!) 44   Ht 5\' 4"  (1.626 m)   Wt 127 lb 6.4 oz (57.8 kg)   SpO2 97%   BMI 21.87 kg/m  General: Well Developed, well nourished, and in no acute distress.   MSK: Right shoulder: Normal-appearing Mildly tender palpation anterior shoulder. Range of motion abduction intact to rotation intact external rotation intact.  Pain with abduction arc. Strength 4+/5 abduction 5/5 external and internal rotation. Positive Hawkins and Neer's test.  Positive empty can test.  Negative Yergason's and speeds test.    Lab and Radiology Results  Diagnostic Limited MSK Ultrasound of: Right shoulder Biceps tendon is intact in bicipital groove Mild hypoechoic fluid tracks within tendon sheath. Subscapularis tendon is intact. Supraspinatus tendon is intact however the posterior aspect the tendon has areas of hypoechoic change in the superficial portion of the tendon which could  represent a partial tear without retraction. Infraspinatus tendon is intact. AC joint mild effusion Impression: Possible partial supraspinatus tear without retraction   X-ray images right shoulder obtained today personally and independently interpreted Humeral head elevated a bit in glenoid.  No severe DJD.  No acute fractures. Await formal radiology review   Assessment and Plan: 75 y.o. female with right shoulder pain after weightlifting.  Etiology is somewhat unclear.  Simple shoulder impingement is the most likely explanation.  Ultrasound examination is a bit abnormal indicating a possible supraspinatus tear however I am not certain based on the appearance of the ultrasound.  She does have well-preserved strength.  Plan for trial of physical therapy and after about 4 weeks if not improved next step should be MRI. Recheck in 4 weeks or send me a message and can proceed MRI next if not better.Marland Kitchen   PDMP not reviewed this encounter. Orders Placed This Encounter  Procedures   [place] LIMITED JOINT SPACE STRUCTURES UP RIGHT(NO LINKED CHARGES)    Standing Status:   Future    Number of Occurrences:   1    Standing Expiration Date:   07/17/2021    Order Specific Question:   Reason for Exam (SYMPTOM  OR DIAGNOSIS REQUIRED)    Answer:   right shoulder pain    Order Specific Question:   Preferred imaging location?    Answer:   [place]   DG Shoulder Right    Standing Status:   Future  Number of Occurrences:   1    Standing Expiration Date:   01/16/2022    Order Specific Question:   Reason for Exam (SYMPTOM  OR DIAGNOSIS REQUIRED)    Answer:   right shoulder pain    Order Specific Question:   Preferred imaging location?    Answer:   [facility]   Ambulatory referral to Physical Therapy    Referral Priority:   Routine    Referral Type:   Physical Medicine    Referral Reason:   Specialty Services Required    Requested Specialty:   Physical Therapy    Number  of Visits Requested:   1   No orders of the defined types were placed in this encounter.    Discussed warning signs or symptoms. Please see discharge instructions. Patient expresses understanding.   The above documentation has been reviewed and is accurate and complete Lynne Leader, M.D.

## 2021-01-16 NOTE — Patient Instructions (Addendum)
Thank you for coming in today.   Please get an Xray today before you leave   I've referred you to Physical Therapy.  Let us know if you don't hear from them in one week.   Check back in 1 month  If not better will proceed to MRI

## 2021-01-19 NOTE — Progress Notes (Signed)
Right shoulder shows some mild arthritis changes and a bone spur or a old fracture at the rim of the shoulder socket

## 2021-01-22 DIAGNOSIS — S43421A Sprain of right rotator cuff capsule, initial encounter: Secondary | ICD-10-CM | POA: Diagnosis not present

## 2021-01-22 DIAGNOSIS — M7521 Bicipital tendinitis, right shoulder: Secondary | ICD-10-CM | POA: Diagnosis not present

## 2021-01-24 DIAGNOSIS — S43421A Sprain of right rotator cuff capsule, initial encounter: Secondary | ICD-10-CM | POA: Diagnosis not present

## 2021-01-24 DIAGNOSIS — M7521 Bicipital tendinitis, right shoulder: Secondary | ICD-10-CM | POA: Diagnosis not present

## 2021-01-26 DIAGNOSIS — M7521 Bicipital tendinitis, right shoulder: Secondary | ICD-10-CM | POA: Diagnosis not present

## 2021-01-26 DIAGNOSIS — S43421A Sprain of right rotator cuff capsule, initial encounter: Secondary | ICD-10-CM | POA: Diagnosis not present

## 2021-01-31 DIAGNOSIS — S43421A Sprain of right rotator cuff capsule, initial encounter: Secondary | ICD-10-CM | POA: Diagnosis not present

## 2021-01-31 DIAGNOSIS — M7521 Bicipital tendinitis, right shoulder: Secondary | ICD-10-CM | POA: Diagnosis not present

## 2021-02-03 DIAGNOSIS — S43421A Sprain of right rotator cuff capsule, initial encounter: Secondary | ICD-10-CM | POA: Diagnosis not present

## 2021-02-03 DIAGNOSIS — M7521 Bicipital tendinitis, right shoulder: Secondary | ICD-10-CM | POA: Diagnosis not present

## 2021-02-06 DIAGNOSIS — M7521 Bicipital tendinitis, right shoulder: Secondary | ICD-10-CM | POA: Diagnosis not present

## 2021-02-06 DIAGNOSIS — S43421A Sprain of right rotator cuff capsule, initial encounter: Secondary | ICD-10-CM | POA: Diagnosis not present

## 2021-02-10 DIAGNOSIS — S43421A Sprain of right rotator cuff capsule, initial encounter: Secondary | ICD-10-CM | POA: Diagnosis not present

## 2021-02-10 DIAGNOSIS — M7521 Bicipital tendinitis, right shoulder: Secondary | ICD-10-CM | POA: Diagnosis not present

## 2021-02-13 DIAGNOSIS — S43421A Sprain of right rotator cuff capsule, initial encounter: Secondary | ICD-10-CM | POA: Diagnosis not present

## 2021-02-13 DIAGNOSIS — M7521 Bicipital tendinitis, right shoulder: Secondary | ICD-10-CM | POA: Diagnosis not present

## 2021-02-17 DIAGNOSIS — M7521 Bicipital tendinitis, right shoulder: Secondary | ICD-10-CM | POA: Diagnosis not present

## 2021-02-17 DIAGNOSIS — S43421A Sprain of right rotator cuff capsule, initial encounter: Secondary | ICD-10-CM | POA: Diagnosis not present

## 2021-02-20 DIAGNOSIS — M7521 Bicipital tendinitis, right shoulder: Secondary | ICD-10-CM | POA: Diagnosis not present

## 2021-02-20 DIAGNOSIS — S43421A Sprain of right rotator cuff capsule, initial encounter: Secondary | ICD-10-CM | POA: Diagnosis not present

## 2021-02-24 DIAGNOSIS — S43421A Sprain of right rotator cuff capsule, initial encounter: Secondary | ICD-10-CM | POA: Diagnosis not present

## 2021-02-24 DIAGNOSIS — M7521 Bicipital tendinitis, right shoulder: Secondary | ICD-10-CM | POA: Diagnosis not present

## 2021-02-25 NOTE — Progress Notes (Signed)
I, Wendy Poet, LAT, ATC, am serving as scribe for Dr. Lynne Leader.  Yvette Short is a 75 y.o. female who presents to [place] at [facility] today for f/u of R ant shoulder/proximal biceps pain that flared up while doing weight lifting.  She was last seen by Dr. Georgina Snell on 01/16/21 and was referred to PT to [facility].  Today, pt reports that her R shoulder pain has improved but con't to have pain w/ lifting and reaching.  She rates her improvement at 70-80%.  She has completed 11 PT sessions and has been provided a HEP. She is not satisfied with her outcome saying that her pain is still up to 8 out of 10 at times with pain with normal activities such as putting a coat on with overhead motion.  She is not able to pick up a heavy object out of her refrigerator like a gallon of milk.  She would be interested in injections or surgery if needed.  She notes MRI claustrophobia requiring sedating medications previously for MRIs.  Diagnostic imaging: R shoulder XR- 01/16/21  Pertinent review of systems: No fevers or chills  Relevant historical information: Hypertension   Exam:  BP 130/80 (BP Location: Right Arm, Patient Position: Sitting, Cuff Size: Normal)   Pulse 61   Ht 5\' 4"  (1.626 m)   Wt 128 lb 6.4 oz (58.2 kg)   SpO2 95%   BMI 22.04 kg/m  General: Well Developed, well nourished, and in no acute distress.   MSK: Right shoulder normal-appearing Nontender. Range of motion abduction 130 degrees.  External rotation full internal rotation lumbar spine. Strength 4/5 abduction.  5/5 external and internal rotation. Positive Hawkins and Neer's test.    Lab and Radiology Results  DG Shoulder Right  Result Date: 01/16/2021 CLINICAL DATA:  Right shoulder pain. EXAM: RIGHT SHOULDER - 2+ VIEW COMPARISON:  None. FINDINGS: There is no acute fracture or dislocation. The bones are osteopenic. There is mild irregularity of the bony glenoid. A small corticated  appearing bone fragment along the glenoid may represent an old fracture versus a small osteophyte (best seen on the axillary view). The soft tissues are unremarkable. IMPRESSION: 1. No acute fracture or dislocation. 2. Mild degenerative changes of the shoulder and possible small chronic fracture of the glenoid rim versus an osteophyte. Electronically Signed   By: Anner Crete M.D.   On: 01/16/2021 23:29   [place] LIMITED JOINT SPACE STRUCTURES UP RIGHT(NO LINKED CHARGES)  Result Date: 02/10/2021 Diagnostic Limited MSK Ultrasound of: Right shoulder Biceps tendon is intact in bicipital groove Mild hypoechoic fluid tracks within tendon sheath. Subscapularis tendon is intact. Supraspinatus tendon is intact however the posterior aspect the tendon has areas of hypoechoic change in the superficial portion of the tendon which could represent a partial tear without retraction. Infraspinatus tendon is intact. AC joint mild effusion Impression: Possible partial supraspinatus tear without retraction   I, Lynne Leader, personally (independently) visualized and performed the interpretation of the XRAY images attached in this note.      Assessment and Plan: 75 y.o. female with right shoulder pain.  Improved with physical therapy with 11 sessions over the last 6 weeks but not sufficiently improved to return to normal activity.  At this point plan for MRI to further evaluate cause of pain and for potential next step planning including injection or surgery.  Recheck after MRI. Patient is claustrophobic MRI.  Ativan prescribed.   PDMP not reviewed this encounter. Orders  Placed This Encounter  Procedures   MR SHOULDER RIGHT WO CONTRAST    Standing Status:   Future    Standing Expiration Date:   02/26/2022    Order Specific Question:   What is the patient's sedation requirement?    Answer:   Anti-anxiety    Order Specific Question:   Does the patient have a pacemaker or implanted devices?    Answer:   No    Order  Specific Question:   Preferred imaging location?    Answer:   [facility] development [facility] (table limit-350lbs)   Meds ordered this encounter  Medications   LORazepam (ATIVAN) 0.5 MG tablet    Sig: 1-2 tabs 30 - 60 min prior to MRI. Do not drive with this medicine.    Dispense:  4 tablet    Refill:  0     Discussed warning signs or symptoms. Please see discharge instructions. Patient expresses understanding.   The above documentation has been reviewed and is accurate and complete Lynne Leader, M.D.

## 2021-02-26 ENCOUNTER — Encounter: Payer: Self-pay | Admitting: Family Medicine

## 2021-02-26 ENCOUNTER — Ambulatory Visit: Payer: Medicare PPO | Admitting: Family Medicine

## 2021-02-26 ENCOUNTER — Other Ambulatory Visit: Payer: Self-pay

## 2021-02-26 VITALS — BP 130/80 | HR 61 | Ht 64.0 in | Wt 128.4 lb

## 2021-02-26 DIAGNOSIS — M25511 Pain in right shoulder: Secondary | ICD-10-CM | POA: Diagnosis not present

## 2021-02-26 MED ORDER — LORAZEPAM 0.5 MG PO TABS: ORAL_TABLET | ORAL | 0 refills | Status: DC

## 2021-02-26 NOTE — Patient Instructions (Signed)
Thank you for coming in today.  You should hear from MRI scheduling within 1 week. If you do not hear please let me know.   Recheck following MRI.    

## 2021-03-08 ENCOUNTER — Other Ambulatory Visit: Payer: Self-pay

## 2021-03-08 ENCOUNTER — Other Ambulatory Visit: Payer: Medicare PPO

## 2021-03-08 ENCOUNTER — Ambulatory Visit (INDEPENDENT_AMBULATORY_CARE_PROVIDER_SITE_OTHER): Payer: Medicare PPO

## 2021-03-08 DIAGNOSIS — M25511 Pain in right shoulder: Secondary | ICD-10-CM | POA: Diagnosis not present

## 2021-03-08 DIAGNOSIS — M75111 Incomplete rotator cuff tear or rupture of right shoulder, not specified as traumatic: Secondary | ICD-10-CM | POA: Diagnosis not present

## 2021-03-08 DIAGNOSIS — M79621 Pain in right upper arm: Secondary | ICD-10-CM | POA: Diagnosis not present

## 2021-03-09 NOTE — Progress Notes (Signed)
MRI of the shoulder shows medium tendinitis and a partial thickness tear of one of the rotator cuff tendons.  It additionally shows some medium tendinitis of the biceps tendon. Recommend return to clinic to go over the results of full detail and discuss treatment plan and options including possible injection or possible surgical referral.

## 2021-03-10 ENCOUNTER — Encounter: Payer: Self-pay | Admitting: Family Medicine

## 2021-03-10 ENCOUNTER — Ambulatory Visit: Payer: Self-pay

## 2021-03-10 ENCOUNTER — Ambulatory Visit: Payer: Medicare PPO | Admitting: Family Medicine

## 2021-03-10 ENCOUNTER — Other Ambulatory Visit: Payer: Self-pay

## 2021-03-10 VITALS — BP 124/64 | Ht 64.0 in | Wt 128.4 lb

## 2021-03-10 DIAGNOSIS — M25511 Pain in right shoulder: Secondary | ICD-10-CM

## 2021-03-10 NOTE — Progress Notes (Signed)
I, Wendy Poet, LAT, ATC, am serving as scribe for Dr. Lynne Leader.  Yvette Short is a 75 y.o. female who presents to [place] at [facility] today for f/u R shoulder pain w/ MRI review. Pt was last seen by Dr. Georgina Snell on 02/26/21 and noted 70-80% improvement in her symptoms after completing 11 PT sessions at [facility].  She was referred for an MRI prescribed Ativan to proceed to MRI. Today, pt reports that her R shoulder remains about the same.  She has been d/c from PT.  She rates her improvement and her pain as moderate.   Dx imaging: 12/11/223 R shoulder MRI  01/16/21 R shoulder XR  Pertinent review of systems: No fevers or chills  Relevant historical information: Hypothyroidism.  Osteopenia.   Exam:  BP 124/64 (BP Location: Right Arm, Patient Position: Sitting, Cuff Size: Normal)    Ht '5\' 4"'  (1.626 m)    Wt 128 lb 6.4 oz (58.2 kg)    BMI 22.04 kg/m  General: Well Developed, well nourished, and in no acute distress.   MSK: Right shoulder normal-appearing normal motion.    Lab and Radiology Results  Procedure: Real-time Ultrasound Guided Injection of right shoulder glenohumeral joint posterior approach Device: Philips Affiniti 50G Images permanently stored and available for review in PACS Verbal informed consent obtained.  Discussed risks and benefits of procedure. Warned about infection bleeding damage to structures skin hypopigmentation and fat atrophy among others. Patient expresses understanding and agreement Time-out conducted.   Noted no overlying erythema, induration, or other signs of local infection.   Skin prepped in a sterile fashion.   Local anesthesia: Topical Ethyl chloride.   With sterile technique and under real time ultrasound guidance: 40 mg of Kenalog and 2 mL of Marcaine injected into shoulder joint. Fluid seen entering the joint capsule.   Completed without difficulty   Pain moderately  resolved suggesting accurate placement  of the medication.   Advised to call if fevers/chills, erythema, induration, drainage, or persistent bleeding.   Images permanently stored and available for review in the ultrasound unit.  Impression: Technically successful ultrasound guided injection.      No results found for this or any previous visit (from the past 72 hour(s)). MR SHOULDER RIGHT WO CONTRAST  Result Date: 03/09/2021 CLINICAL DATA:  Right shoulder and humerus pain with limited range of motion for 6 weeks. Assess for bursitis. EXAM: MRI OF THE RIGHT SHOULDER WITHOUT CONTRAST TECHNIQUE: Multiplanar, multisequence MR imaging of the shoulder was performed. No intravenous contrast was administered. COMPARISON:  X-ray shoulder 01/16/2021. FINDINGS: Rotator cuff: Mild tendinosis of the supraspinatus tendon with fraying along the anterior bursal surface. Mild tendinosis of the infraspinatus tendon. Teres minor tendon is intact. Moderate tendinosis of the subscapularis tendon with a partial-thickness tear. Muscles: No atrophy or abnormal signal of the muscles of the rotator cuff. Biceps long head: Moderate tendinosis of the intra-articular portion and proximal extra-articular portion of the long head of the biceps tendon. Acromioclavicular Joint: Moderate arthropathy of the acromioclavicular joint. Type II acromion. Trace subacromial/subdeltoid bursal fluid. Glenohumeral Joint: No joint effusion. Partial-thickness cartilage loss of the glenohumeral joint. Labrum: Grossly intact, but evaluation is limited by lack of intraarticular fluid. Bones: No acute fracture or dislocation. No aggressive osseous lesion. Subcortical reactive marrow changes in the lesser tuberosity. Other: No fluid collection or hematoma. IMPRESSION: 1. Mild tendinosis of the supraspinatus tendon with fraying along the anterior bursal surface. 2. Mild tendinosis of the infraspinatus tendon.  3. Moderate tendinosis of the subscapularis tendon with a partial-thickness tear. 4.  Moderate tendinosis of the intra-articular portion and proximal extra-articular portion of the long head of the biceps tendon. Electronically Signed   By: Kathreen Devoid M.D.   On: 03/09/2021 08:37     I, Lynne Leader, personally (independently) visualized and performed the interpretation of the images attached in this note.   Assessment and Plan: 75 y.o. female with right shoulder pain.  Pain is multifactorial.  Patient has rotator cuff tendinopathy in all 3 major rotator cuff tendons.  Worse rotator cuff tendon is the subscapularis which has a partial thickness tear.  Additionally she has biceps tendinitis as well.  She did have moderate improvement with physical therapy but still has pain and has not met her goal of improvement.  Discussed options.  Plan for injection today. Will inject the [facility] joint as this should proved the most benefit to the most structures. Continue home exercises.  Recheck in 1 month. If not improved enough will consider referral to orthopedic surgery to discuss surgical options.   PDMP not reviewed this encounter. Orders Placed This Encounter  Procedures   [place] LIMITED JOINT SPACE STRUCTURES UP RIGHT(NO LINKED CHARGES)    Order Specific Question:   Reason for Exam (SYMPTOM  OR DIAGNOSIS REQUIRED)    Answer:   R shoulder pain    Order Specific Question:   Preferred imaging location?    Answer:   [facility]   No orders of the defined types were placed in this encounter.    Discussed warning signs or symptoms. Please see discharge instructions. Patient expresses understanding.   The above documentation has been reviewed and is accurate and complete Lynne Leader, M.D.

## 2021-03-10 NOTE — Patient Instructions (Addendum)
Good to see you today.  You had a R shoulder injection.  Call or go to the ER if you develop a large red swollen joint with extreme pain or oozing puss.   Follow-up: one month

## 2021-04-09 ENCOUNTER — Ambulatory Visit: Payer: Medicare PPO | Admitting: Family Medicine

## 2021-04-09 ENCOUNTER — Other Ambulatory Visit: Payer: Self-pay

## 2021-04-09 ENCOUNTER — Other Ambulatory Visit: Payer: Self-pay | Admitting: Cardiology

## 2021-04-09 ENCOUNTER — Encounter: Payer: Self-pay | Admitting: Family Medicine

## 2021-04-09 VITALS — BP 110/80 | HR 59 | Ht 64.0 in | Wt 128.2 lb

## 2021-04-09 DIAGNOSIS — M25511 Pain in right shoulder: Secondary | ICD-10-CM | POA: Diagnosis not present

## 2021-04-09 NOTE — Progress Notes (Signed)
I, Wendy Poet, LAT, ATC, am serving as scribe for Dr. Lynne Leader.  Yvette Short is a 76 y.o. female who presents to [place] at [facility] today for f/u R shoulder pain. Pt previously completed 11 visits at [facility] PT. Pt was last seen by Dr. Georgina Snell on 03/10/21 and was given a R [place] steroid injection and advised to cont HEP. Today, pt reports that her R shoulder is doing much better.  She states that she will get intermittent pain in her R upper arm that feels like a clamp on her R upper arm.  She has completed PT and is doing her HEP per PT.  Dx imaging: 12/11/223 R shoulder MRI             01/16/21 R shoulder XR  Pertinent review of systems: No fever or chills  Relevant historical information: Lumbar spinal stenosis   Exam:  BP 110/80 (BP Location: Right Arm, Patient Position: Sitting, Cuff Size: Normal)    Pulse (!) 59    Ht 5\' 4"  (1.626 m)    Wt 128 lb 3.2 oz (58.2 kg)    SpO2 99%    BMI 22.01 kg/m  General: Well Developed, well nourished, and in no acute distress.   MSK: Right shoulder normal-appearing normal motion intact strength negative impingement testing.    Lab and Radiology Results EXAM: MRI OF THE RIGHT SHOULDER WITHOUT CONTRAST   TECHNIQUE: Multiplanar, multisequence MR imaging of the shoulder was performed. No intravenous contrast was administered.   COMPARISON:  X-ray shoulder 01/16/2021.   FINDINGS: Rotator cuff: Mild tendinosis of the supraspinatus tendon with fraying along the anterior bursal surface. Mild tendinosis of the infraspinatus tendon. Teres minor tendon is intact. Moderate tendinosis of the subscapularis tendon with a partial-thickness tear.   Muscles: No atrophy or abnormal signal of the muscles of the rotator cuff.   Biceps long head: Moderate tendinosis of the intra-articular portion and proximal extra-articular portion of the long head of the biceps tendon.   Acromioclavicular Joint: Moderate  arthropathy of the acromioclavicular joint. Type II acromion. Trace subacromial/subdeltoid bursal fluid.   Glenohumeral Joint: No joint effusion. Partial-thickness cartilage loss of the glenohumeral joint.   Labrum: Grossly intact, but evaluation is limited by lack of intraarticular fluid.   Bones: No acute fracture or dislocation. No aggressive osseous lesion. Subcortical reactive marrow changes in the lesser tuberosity.   Other: No fluid collection or hematoma.   IMPRESSION: 1. Mild tendinosis of the supraspinatus tendon with fraying along the anterior bursal surface. 2. Mild tendinosis of the infraspinatus tendon. 3. Moderate tendinosis of the subscapularis tendon with a partial-thickness tear. 4. Moderate tendinosis of the intra-articular portion and proximal extra-articular portion of the long head of the biceps tendon.     Electronically Signed   By: Kathreen Devoid M.D.   On: 03/09/2021 08:37   I, Lynne Leader, personally (independently) visualized and performed the interpretation of the images attached in this note.      Assessment and Plan: 76 y.o. female with right shoulder pain thought to be due to rotator cuff tendinopathy.  Improved with PT and intra-articular injection.  Patient has now been discharged from PT.  Plan to continue home exercise program and recheck back as needed.  Discussed treatment plan options and home exercise program going forward.  Recheck back as needed. Total encounter time 20 minutes including face-to-face time with the patient and, reviewing past medical record, and charting on the date of service.  Discussed warning signs or symptoms. Please see discharge instructions. Patient expresses understanding.   The above documentation has been reviewed and is accurate and complete Lynne Leader, M.D.

## 2021-04-09 NOTE — Patient Instructions (Signed)
Good to see you.  Keep up with your home exercises.  Follow-up as needed.

## 2021-04-14 DIAGNOSIS — Z6821 Body mass index (BMI) 21.0-21.9, adult: Secondary | ICD-10-CM | POA: Diagnosis not present

## 2021-04-14 DIAGNOSIS — N950 Postmenopausal bleeding: Secondary | ICD-10-CM | POA: Diagnosis not present

## 2021-04-14 DIAGNOSIS — Z01419 Encounter for gynecological examination (general) (routine) without abnormal findings: Secondary | ICD-10-CM | POA: Diagnosis not present

## 2021-04-17 DIAGNOSIS — H5213 Myopia, bilateral: Secondary | ICD-10-CM | POA: Diagnosis not present

## 2021-04-17 DIAGNOSIS — H2513 Age-related nuclear cataract, bilateral: Secondary | ICD-10-CM | POA: Diagnosis not present

## 2021-04-27 ENCOUNTER — Other Ambulatory Visit: Payer: Self-pay

## 2021-04-27 ENCOUNTER — Ambulatory Visit: Payer: Medicare PPO | Admitting: Family Medicine

## 2021-04-27 VITALS — BP 128/80 | HR 75 | Ht 64.0 in | Wt 128.7 lb

## 2021-04-27 DIAGNOSIS — S46101A Unspecified injury of muscle, fascia and tendon of long head of biceps, right arm, initial encounter: Secondary | ICD-10-CM | POA: Diagnosis not present

## 2021-04-27 NOTE — Progress Notes (Signed)
° °  I, Peterson Lombard, LAT, ATC acting as a scribe for Lynne Leader, MD.  Yvette Short is a 76 y.o. female who presents to [place] at [facility] today for R upper arm contusion. Pt was last seen by Dr. Georgina Snell on 04/09/21 for her R shoulder and was advised to cont HEP. Today, pt report no trauma to her R upper arm that appeared yesterday. Pt locates pain to mid-biceps w/ a "lump" underneath the discoloration, which has gone since yesterday. No numbness/tingling notes.  Dx imaging: 12/11/223 R shoulder MRI             01/16/21 R shoulder XR  Pertinent review of systems: No fevers or chills  Relevant historical information: Rheumatic heart disease.   Exam:  BP 128/80    Pulse 75    Ht 5\' 4"  (1.626 m)    Wt 128 lb 11.2 oz (58.4 kg)    SpO2 99%    BMI 22.09 kg/m  General: Well Developed, well nourished, and in no acute distress.   MSK: Right arm bruising at distal anterior biceps.  Minimally tender at distal biceps.  Nontender at distal biceps tendon or proximal biceps tendon.  Biceps distally is more prominent with a mild Popeye arm deformity. Intact strength to elbow flexion. No pain or weakness with resisted hand supination.       Assessment and Plan: 76 y.o. female with left arm bruising thought to be proximal biceps long head tendon rupture.  She is basically not symptomatic at this point.  Additionally once I discussed with her the probable diagnosis she does not think that is bad enough to deal with and does not think it is bad enough or bothersome enough to even ultrasound today.  I think this is reasonable and we can treat expectantly and conservatively.  Watchful waiting for now.    Discussed warning signs or symptoms. Please see discharge instructions. Patient expresses understanding.   The above documentation has been reviewed and is accurate and complete Lynne Leader, M.D.

## 2021-04-27 NOTE — Patient Instructions (Addendum)
Thank you for coming in today. Recheck as needed.    

## 2021-04-29 HISTORY — PX: CATARACT EXTRACTION: SUR2

## 2021-05-07 DIAGNOSIS — H25811 Combined forms of age-related cataract, right eye: Secondary | ICD-10-CM | POA: Diagnosis not present

## 2021-05-07 DIAGNOSIS — H2511 Age-related nuclear cataract, right eye: Secondary | ICD-10-CM | POA: Diagnosis not present

## 2021-05-13 ENCOUNTER — Other Ambulatory Visit: Payer: Self-pay

## 2021-05-13 ENCOUNTER — Ambulatory Visit: Payer: Medicare PPO

## 2021-05-14 DIAGNOSIS — H25812 Combined forms of age-related cataract, left eye: Secondary | ICD-10-CM | POA: Diagnosis not present

## 2021-05-14 DIAGNOSIS — H2512 Age-related nuclear cataract, left eye: Secondary | ICD-10-CM | POA: Diagnosis not present

## 2021-05-29 NOTE — Progress Notes (Addendum)
Subjective:   Yvette Short is a 76 y.o. female who presents for Medicare Annual (Subsequent) preventive examination.  Review of Systems    I connected with  Yvette Short on 06/02/21 by an audio only telemedicine application and verified that I am speaking with the correct person using two identifiers.   I discussed the limitations, risks, security and privacy concerns of performing an evaluation and management service by telephone and the availability of in person appointments. I also discussed with the patient that there may be a patient responsible charge related to this service. The patient expressed understanding and verbally consented to this telephonic visit.  Location of Patient: Home Location of Provider: Office  List any persons and their role that are participating in the visit with the patient.   [address] [place], [place]     Objective:    There were no vitals filed for this visit. There is no height or weight on file to calculate BMI.  Advanced Directives 06/02/2021 05/07/2020 04/15/2020  Does Patient Have a Medical Advance Directive? Yes Yes No  Type of Advance Directive - [place];Living will -  Does patient want to make changes to medical advance directive? No - Patient declined - -  Copy of [place] in Chart? - No - copy requested -    Current Medications (verified) Outpatient Encounter Medications as of 06/02/2021  Medication Sig   acebutolol (SECTRAL) 200 MG capsule TAKE 1 CAPSULE BY MOUTH TWICE A DAY   amLODipine (NORVASC) 5 MG tablet Take 1 tablet (5 mg total) by mouth daily.   aspirin EC 81 MG tablet Take 81 mg by mouth daily. Swallow whole.   atorvastatin (LIPITOR) 40 MG tablet TAKE 1 TABLET BY MOUTH EVERY DAY   chlorpheniramine (CHLOR-TRIMETON) 4 MG tablet Take 4 mg by mouth every 4 (four) hours as needed for allergies.   Ergocalciferol (VITAMIN D2) 2000 units TABS Take 1 tablet by mouth daily.    estradiol (VIVELLE-DOT) 0.0375 MG/24HR Place 1 patch onto the skin 2 (two) times a week.   gabapentin (NEURONTIN) 100 MG capsule TAKE 2-3 CAPSULES (200-300 MG TOTAL) BY MOUTH AT BEDTIME.   levothyroxine (SYNTHROID, LEVOTHROID) 88 MCG tablet Take 88 mcg by mouth daily.   losartan (COZAAR) 25 MG tablet Take 0.5 tablets (12.5 mg total) by mouth daily.   Probiotic Product (PROBIOTIC-10 PO) Take by mouth.   progesterone (PROMETRIUM) 100 MG capsule Take by mouth.   [DISCONTINUED] Azelaic Acid 15 % cream APPLY TO AFFECTED AREA ON FACE UP TO TWO TIMES A DAY AS NEEDED (Patient not taking: Reported on 01/16/2021)   No facility-administered encounter medications on file as of 06/02/2021.    Allergies (verified) Tetracycline hcl   History: Past Medical History:  Diagnosis Date   Abnormal colonoscopy 03/2017   decreased rectal tone and polyp; 5 year recall   Abnormal findings on esophagogastroduodenoscopy (EGD) 02/25/2016   Z-line irrg. at 40 cm. No abnomrality of the esophagus to explain dysphagia. Esophagus dilated. H/o + H. Pylori   Allergy    ANXIETY 07/06/2007   Aortic atherosclerosis ([place]) 02/08/2018   Aortic valve regurgitation    Atherosclerosis of native coronary artery of native heart without angina pectoris 02/08/2018   Biceps tendonitis on right    Bronchiectasis without complication ([place]) 90/21/1155   CT 2012 IMPRESSION:  1. Apical scarring may account for the plain film abnormality.  2. A 6 mm right upper lobe pulmonary nodule.  - -  HRCT 07/12/2017 >>>  Scattered minimal cylindrical and varicoid bronchiectasis with associated scattered mucoid impaction and minimal tree-in-bud opacity in both lungs, predominantly in the mid to lower lungs. Findings are stable to slightly worsened since 2015 ches   Chicken pox    Chronic cough 05/18/2017   CT chest 03/19/14  No bronchiectasis (not hrct)  Spirometry 05/18/2017  FEV1  2.15 (93%)  Ratio 76 s rx prior  - FENO 05/18/2017  =   Could not perform    - Allergy profile 05/18/2017 >  Eos 0.2 /  IgE  75 RAST pos cat > dog > mold - HRCT 07/12/2017 >>>  See bronchiectasis - trial of dymista 07/26/2017 > some better but not consistent with it > rechallenge rx one puff each am 10/25/2017  - MCT 11/02/17 >     Colon polyp 03/31/2017   hyperplastic   COVID-19 08/04/2020   mild   Diverticulosis 03/2017   sigmoid   Family history of ovarian cancer    Genetic testing 03/26/2016   Negative genetic testing on the [facility] 30 gene panel.  The 30-Gene Cancer Panel offered by [facility] includes sequencing and/or deletion duplication testing of the following 30 genes: APC, ATM, BAP1, BARD1, BMPR1A, BRCA1, BRCA2, BRIP1, CDH1, CDK4, CDKN2A (p14ARF), CDKN2A (p16INK4a), CHEK2, EPCAM, GREM1, MITF, MLH1, MSH2, MSH6, MUTYH, NBN, PALB2, PMS2, POLD1, POLE, PTEN, RAD51C, RAD51D, SMAD4,    GERD 11/09/2006   H. pylori infection    h/o treated wiht prevpac   Hormone replacement therapy 01/08/2019   HYPERLIPIDEMIA 11/09/2006   Hypertension    Hypothyroidism 08/03/2017   Lumbar radiculopathy 07/24/2014   Migraine    Mild depression 02/14/2019   Pharyngoesophageal dysphagia 08/03/2017   Piriformis syndrome of right side    Spinal stenosis, lumbar region, with neurogenic claudication 10/16/2014   Epidural 10/02/2014, December 2016    Stomach ulcer 2008   Past Surgical History:  Procedure Laterality Date   CERVICAL POLYPECTOMY  01/17/2019   LIPOMA EXCISION     of back   TUBAL LIGATION     Family History  Problem Relation Age of Onset   Dementia Mother    Hyperlipidemia Mother    Colon cancer Father 32   Colon polyps Brother        gets colonoscopy every 2-3 years   Hyperlipidemia Brother    Hypertension Brother    Uterine cancer Maternal Aunt        dx in her 77s   Colon cancer Maternal Uncle    Colon cancer Paternal Aunt    Heart attack Maternal Grandfather    Cancer Other        MGMs sister - "abdominal cancer"   Breast cancer Cousin    Social  History   Socioeconomic History   Marital status: Married    Spouse name: Not on file   Number of children: Not on file   Years of education: Not on file   Highest education level: Not on file  Occupational History   Not on file  Tobacco Use   Smoking status: Former    Packs/day: 2.50    Years: 13.00    Pack years: 32.50    Types: Cigarettes    Quit date: 07/28/1974    Years since quitting: 46.8   Smokeless tobacco: Never  Vaping Use   Vaping Use: Never used  Substance and Sexual Activity   Alcohol use: Yes    Alcohol/week: 2.0 standard drinks    Types: 2 Cans of  beer per week   Drug use: No   Sexual activity: Yes    Partners: Male  Other Topics Concern   Not on file  Social History Narrative   Married. Two children.    College grad.    Former smoker.    Exercises routinely.    Drink caffeine.    Wears a hearing aid.    Smoke alarm in the home.    Wears her seatbelt.    Feels safe in her relationships.    Social Determinants of Health   Financial Resource Strain: Low Risk    Difficulty of Paying Living Expenses: Not hard at all  Food Insecurity: No Food Insecurity   Worried About Charity fundraiser in the Last Year: Never true   [place] in the Last Year: Never true  Transportation Needs: No Transportation Needs   Lack of Transportation (Medical): No   Lack of Transportation (Non-Medical): No  Physical Activity: Sufficiently Active   Days of Exercise per Week: 3 days   Minutes of Exercise per Session: 60 min  Stress: No Stress Concern Present   Feeling of Stress : Not at all  Social Connections: Moderately Isolated   Frequency of Communication with Friends and Family: Once a week   Frequency of Social Gatherings with Friends and Family: Once a week   Attends Religious Services: More than 4 times per year   Active Member of [employer] or Organizations: No   Attends Music therapist: Never   Marital Status: Married    Tobacco  Counseling Counseling given: Not Answered   Clinical Intake:  Pre-visit preparation completed: Yes        Nutritional Risks: None Diabetes: No  How often do you need to have someone help you when you read instructions, pamphlets, or other written materials from your doctor or pharmacy?: 1 - Never  Diabetic?No  Interpreter Needed?: No      Activities of Daily Living In your present state of health, do you have any difficulty performing the following activities: 06/02/2021  Hearing? N  Vision? N  Difficulty concentrating or making decisions? N  Walking or climbing stairs? N  Dressing or bathing? N  Doing errands, shopping? N  Preparing Food and eating ? N  Using the Toilet? N  In the past six months, have you accidently leaked urine? N  Do you have problems with loss of bowel control? N  Managing your Medications? N  Managing your Finances? N  Housekeeping or managing your Housekeeping? N  Some recent data might be hidden    Patient Care Team: Ma Hillock, DO as PCP - General (Family Medicine) Berniece Salines, DO as PCP - Cardiology (Cardiology) Dian Queen, MD as Consulting Physician (Obstetrics and Gynecology) Tanda Rockers, MD as Consulting Physician (Pulmonary Disease) Lyndal Pulley, DO as Consulting Physician (Sports Medicine) Celedonio Miyamoto, MD as Referring Physician (Gastroenterology) Marica Otter, OD (Optometry)  Indicate any recent Medical Services you may have received from other than [facility] providers in the past year (date may be approximate).     Assessment:   This is a routine wellness examination for [place].  Hearing/Vision screen No results found.  Dietary issues and exercise activities discussed: Current Exercise Habits: Home exercise routine, Exercise limited by: None identified   Goals Addressed               This Visit's Progress     Increase My Muscle Strength (pt-stated)  Why is this important?   Walking and  easy exercises make you stronger. These also give you energy.  Every little bit helps, at any age.           Depression Screen PHQ 2/9 Scores 06/02/2021 01/07/2021 06/26/2020 05/07/2020 12/25/2019 02/07/2019 08/03/2017  PHQ - 2 Score 0 0 1 0 1 1 0  PHQ- 9 Score - 0 6 - 4 - -    Fall Risk Fall Risk  01/07/2021 06/26/2020 05/07/2020 12/25/2019 07/17/2019  Falls in the past year? 0 0 1 0 1  Number falls in past yr: 0 0 1 0 0  Injury with Fall? 0 0 1 0 0  Risk for fall due to : - - History of fall(s) - -  Follow up - Falls evaluation completed Falls prevention discussed Falls evaluation completed Falls evaluation completed    FALL RISK PREVENTION PERTAINING TO THE HOME:  Any stairs in or around the home? Yes  If so, are there any without handrails? Yes  Home free of loose throw rugs in walkways, pet beds, electrical cords, etc? Yes  Adequate lighting in your home to reduce risk of falls? Yes   ASSISTIVE DEVICES UTILIZED TO PREVENT FALLS:  Life alert? No  Use of a cane, walker or w/c? No  Grab bars in the bathroom? No  Shower chair or bench in shower? No  Elevated toilet seat or a handicapped toilet? No   TIMED UP AND GO:  Was the test performed?  n/a .  Length of time to ambulate 10 feet: n/a sec.     Cognitive Function:     6CIT Screen 06/02/2021  What Year? 0 points  What month? 0 points  What time? 0 points  Count back from 20 0 points  Months in reverse 0 points  Repeat phrase 0 points  Total Score 0    Immunizations Immunization History  Administered Date(s) Administered   Fluad Quad(high Dose 65+) 12/25/2019   Influenza Split 12/21/2010, 12/21/2011   Influenza Whole 01/04/2008, 12/31/2008, 01/14/2010   Influenza, High Dose Seasonal PF 01/03/2013, 12/26/2013, 02/12/2016, 12/28/2016, 12/05/2020   Influenza-Unspecified 12/06/2017   PFIZER(Purple Top)SARS-COV-2 Vaccination 04/22/2019, 05/14/2019, 01/25/2020   Pneumococcal Conjugate-13 02/16/2016   Pneumococcal  Polysaccharide-23 04/10/2012   Td 03/09/2001, 12/10/2008   Tdap 12/10/2008, 12/28/2016   Zoster Recombinat (Shingrix) 08/03/2017, 12/29/2017, 12/29/2017   Zoster, Live 12/21/2010    TDAP status: Up to date  Flu Vaccine status: Up to date  Pneumococcal vaccine status: Up to date  Covid-19 vaccine status: Information provided on how to obtain vaccines.   Qualifies for Shingles Vaccine? Yes   Zostavax completed No   Shingrix Completed?: Yes  Screening Tests Health Maintenance  Topic Date Due   COVID-19 Vaccine (4 - Booster for Pfizer series) 03/21/2020   MAMMOGRAM  07/08/2022   TETANUS/TDAP  12/29/2026   COLONOSCOPY (Pts 45-57yr Insurance coverage will need to be confirmed)  04/01/2027   Pneumonia Vaccine 75 Years old  Completed   INFLUENZA VACCINE  Completed   DEXA SCAN  Completed   Hepatitis C Screening  Completed   Zoster Vaccines- Shingrix  Completed   HPV VACCINES  Aged Out    Health Maintenance  Health Maintenance Due  Topic Date Due   COVID-19 Vaccine (4 - Booster for [place]) 03/21/2020    Colorectal cancer screening: Type of screening: Colonoscopy. Completed 03/31/2017. Repeat every 10 years  Mammogram status: Completed 07/07/2020. Repeat every year  Bone Density status: Completed 05/27/2016. Results reflect:  Bone density results: NORMAL. Repeat every N/A years.  Lung Cancer Screening: (Low Dose CT Chest recommended if Age 50-80 years, 30 pack-year currently smoking OR have quit w/in 15years.) does not qualify.   Lung Cancer Screening Referral: N/A  Additional Screening:  Hepatitis C Screening: does qualify; Completed 03/29/2016  Vision Screening: Recommended annual ophthalmology exams for early detection of glaucoma and other disorders of the eye. Is the patient up to date with their annual eye exam?  Yes  Who is the provider or what is the name of the office in which the patient attends annual eye exams? [facility] If pt is not  established with a provider, would they like to be referred to a provider to establish care? No .   Dental Screening: Recommended annual dental exams for proper oral hygiene  Community Resource Referral / Chronic Care Management: CRR required this visit?  No   CCM required this visit?  No      Plan:     I have personally reviewed and noted the following in the patients chart:   Medical and social history Use of alcohol, tobacco or illicit drugs  Current medications and supplements including opioid prescriptions.  Functional ability and status Nutritional status Physical activity Advanced directives List of other physicians Hospitalizations, surgeries, and ER visits in previous 12 months Vitals Screenings to include cognitive, depression, and falls Referrals and appointments  In addition, I have reviewed and discussed with patient certain preventive protocols, quality metrics, and best practice recommendations. A written personalized care plan for preventive services as well as general preventive health recommendations were provided to patient.     Octaviano Glow, CMA   06/02/2021   Nurse Notes: Non-Face to Face or Face to Face 10 minute visit Encounter    Ms. Prevo , Thank you for taking time to come for your Medicare Wellness Visit. I appreciate your ongoing commitment to your health goals. Please review the following plan we discussed and let me know if I can assist you in the future.   These are the goals we discussed:  Goals       Increase My Muscle Strength (pt-stated)       Why is this important?   Walking and easy exercises make you stronger. These also give you energy.  Every little bit helps, at any age.          Patient Stated      Maintain current health & activity level        This is a list of the screening recommended for you and due dates:  Health Maintenance  Topic Date Due   COVID-19 Vaccine (4 - Booster for Pfizer series) 03/21/2020    Mammogram  07/08/2022   Tetanus Vaccine  12/29/2026   Colon Cancer Screening  04/01/2027   Pneumonia Vaccine  Completed   Flu Shot  Completed   DEXA scan (bone density measurement)  Completed   Hepatitis C Screening: USPSTF Recommendation to screen - Ages 77-79 yo.  Completed   Zoster (Shingles) Vaccine  Completed   HPV Vaccine  Aged Out

## 2021-05-29 NOTE — Patient Instructions (Signed)

## 2021-06-02 ENCOUNTER — Other Ambulatory Visit: Payer: Self-pay

## 2021-06-02 ENCOUNTER — Ambulatory Visit (INDEPENDENT_AMBULATORY_CARE_PROVIDER_SITE_OTHER): Payer: Medicare PPO

## 2021-06-02 DIAGNOSIS — Z0000 Encounter for general adult medical examination without abnormal findings: Secondary | ICD-10-CM

## 2021-06-05 ENCOUNTER — Other Ambulatory Visit: Payer: Self-pay | Admitting: Obstetrics and Gynecology

## 2021-06-05 DIAGNOSIS — Z1231 Encounter for screening mammogram for malignant neoplasm of breast: Secondary | ICD-10-CM

## 2021-06-08 ENCOUNTER — Other Ambulatory Visit: Payer: Self-pay | Admitting: Cardiology

## 2021-06-08 ENCOUNTER — Other Ambulatory Visit: Payer: Self-pay | Admitting: Family Medicine

## 2021-06-12 DIAGNOSIS — H35352 Cystoid macular degeneration, left eye: Secondary | ICD-10-CM | POA: Diagnosis not present

## 2021-06-30 ENCOUNTER — Encounter: Payer: Self-pay | Admitting: Family Medicine

## 2021-06-30 ENCOUNTER — Ambulatory Visit (INDEPENDENT_AMBULATORY_CARE_PROVIDER_SITE_OTHER): Payer: Medicare PPO | Admitting: Family Medicine

## 2021-06-30 VITALS — BP 115/71 | HR 70 | Temp 98.3°F | Ht 64.5 in | Wt 126.0 lb

## 2021-06-30 DIAGNOSIS — I4729 Other ventricular tachycardia: Secondary | ICD-10-CM | POA: Diagnosis not present

## 2021-06-30 DIAGNOSIS — R4789 Other speech disturbances: Secondary | ICD-10-CM | POA: Diagnosis not present

## 2021-06-30 DIAGNOSIS — I2510 Atherosclerotic heart disease of native coronary artery without angina pectoris: Secondary | ICD-10-CM

## 2021-06-30 DIAGNOSIS — Z79890 Hormone replacement therapy (postmenopausal): Secondary | ICD-10-CM | POA: Diagnosis not present

## 2021-06-30 DIAGNOSIS — J479 Bronchiectasis, uncomplicated: Secondary | ICD-10-CM | POA: Diagnosis not present

## 2021-06-30 DIAGNOSIS — I10 Essential (primary) hypertension: Secondary | ICD-10-CM

## 2021-06-30 DIAGNOSIS — Z0000 Encounter for general adult medical examination without abnormal findings: Secondary | ICD-10-CM

## 2021-06-30 DIAGNOSIS — R413 Other amnesia: Secondary | ICD-10-CM | POA: Insufficient documentation

## 2021-06-30 DIAGNOSIS — I700 Atherosclerosis of aorta: Secondary | ICD-10-CM

## 2021-06-30 DIAGNOSIS — K635 Polyp of colon: Secondary | ICD-10-CM | POA: Diagnosis not present

## 2021-06-30 DIAGNOSIS — E782 Mixed hyperlipidemia: Secondary | ICD-10-CM | POA: Diagnosis not present

## 2021-06-30 DIAGNOSIS — E034 Atrophy of thyroid (acquired): Secondary | ICD-10-CM | POA: Diagnosis not present

## 2021-06-30 LAB — HEMOGLOBIN A1C: Hgb A1c MFr Bld: 5.7 % (ref 4.6–6.5)

## 2021-06-30 LAB — LIPID PANEL
Cholesterol: 143 mg/dL (ref 0–200)
HDL: 68 mg/dL (ref >39.00)
LDL Cholesterol: 61 mg/dL (ref 0–99)
NonHDL: 75.35
Total CHOL/HDL Ratio: 2
Triglycerides: 74 mg/dL (ref 0.0–149.0)
VLDL: 14.8 mg/dL (ref 0.0–40.0)

## 2021-06-30 LAB — COMPREHENSIVE METABOLIC PANEL
ALT: 17 U/L (ref 0–35)
AST: 20 U/L (ref 0–37)
Albumin: 4.4 g/dL (ref 3.5–5.2)
Alkaline Phosphatase: 55 U/L (ref 39–117)
BUN: 14 mg/dL (ref 6–23)
CO2: 30 mEq/L (ref 19–32)
Calcium: 9.7 mg/dL (ref 8.4–10.5)
Chloride: 104 mEq/L (ref 96–112)
Creatinine, Ser: 0.95 mg/dL (ref 0.40–1.20)
GFR: 58.95 mL/min — ABNORMAL LOW (ref >60.00)
Glucose, Bld: 81 mg/dL (ref 70–99)
Potassium: 4.2 mEq/L (ref 3.5–5.1)
Sodium: 141 mEq/L (ref 135–145)
Total Bilirubin: 0.8 mg/dL (ref 0.2–1.2)
Total Protein: 6.3 g/dL (ref 6.0–8.3)

## 2021-06-30 LAB — CBC
HCT: 40.4 % (ref 36.0–46.0)
Hemoglobin: 13.6 g/dL (ref 12.0–15.0)
MCHC: 33.6 g/dL (ref 30.0–36.0)
MCV: 90.4 fl (ref 78.0–100.0)
Platelets: 239 10*3/uL (ref 150.0–400.0)
RBC: 4.47 Mil/uL (ref 3.87–5.11)
RDW: 13.5 % (ref 11.5–15.5)
WBC: 7.7 10*3/uL (ref 4.0–10.5)

## 2021-06-30 LAB — TSH: TSH: 1.97 u[IU]/mL (ref 0.35–5.50)

## 2021-06-30 MED ORDER — LOSARTAN POTASSIUM 25 MG PO TABS: 12.5000 mg | ORAL_TABLET | Freq: Every day | ORAL | 1 refills | Status: DC

## 2021-06-30 MED ORDER — AMLODIPINE BESYLATE 5 MG PO TABS: 5.0000 mg | ORAL_TABLET | Freq: Every day | ORAL | 1 refills | Status: DC

## 2021-06-30 NOTE — Patient Instructions (Signed)
Health Maintenance After Age 75 After age 75, you are at a higher risk for certain long-term diseases and infections as well as injuries from falls. Falls are a major cause of broken bones and head injuries in people who are older than age 75. Getting regular preventive care can help to keep you healthy and well. Preventive care includes getting regular testing and making lifestyle changes as recommended by your health care provider. Talk with your health care provider about: Which screenings and tests you should have. A screening is a test that checks for a disease when you have no symptoms. A diet and exercise plan that is right for you. What should I know about screenings and tests to prevent falls? Screening and testing are the best ways to find a health problem early. Early diagnosis and treatment give you the best chance of managing medical conditions that are common after age 75. Certain conditions and lifestyle choices may make you more likely to have a fall. Your health care provider may recommend: Regular vision checks. Poor vision and conditions such as cataracts can make you more likely to have a fall. If you wear glasses, make sure to get your prescription updated if your vision changes. Medicine review. Work with your health care provider to regularly review all of the medicines you are taking, including over-the-counter medicines. Ask your health care provider about any side effects that may make you more likely to have a fall. Tell your health care provider if any medicines that you take make you feel dizzy or sleepy. Strength and balance checks. Your health care provider may recommend certain tests to check your strength and balance while standing, walking, or changing positions. Foot health exam. Foot pain and numbness, as well as not wearing proper footwear, can make you more likely to have a fall. Screenings, including: Osteoporosis screening. Osteoporosis is a condition that causes  the bones to get weaker and break more easily. Blood pressure screening. Blood pressure changes and medicines to control blood pressure can make you feel dizzy. Depression screening. You may be more likely to have a fall if you have a fear of falling, feel depressed, or feel unable to do activities that you used to do. Alcohol use screening. Using too much alcohol can affect your balance and may make you more likely to have a fall. Follow these instructions at home: Lifestyle Do not drink alcohol if: Your health care provider tells you not to drink. If you drink alcohol: Limit how much you have to: 0-1 drink a day for women. 0-2 drinks a day for men. Know how much alcohol is in your drink. In the [place] one drink equals one 12 oz bottle of beer (355 mL), one 5 oz glass of wine (148 mL), or one 1 oz glass of hard liquor (44 mL). Do not use any products that contain nicotine or tobacco. These products include cigarettes, chewing tobacco, and vaping devices, such as e-cigarettes. If you need help quitting, ask your health care provider. Activity  Follow a regular exercise program to stay fit. This will help you maintain your balance. Ask your health care provider what types of exercise are appropriate for you. If you need a cane or walker, use it as recommended by your health care provider. Wear supportive shoes that have nonskid soles. Safety  Remove any tripping hazards, such as rugs, cords, and clutter. Install safety equipment such as grab bars in bathrooms and safety rails on stairs. Keep rooms and walkways   well-lit. General instructions Talk with your health care provider about your risks for falling. Tell your health care provider if: You fall. Be sure to tell your health care provider about all falls, even ones that seem minor. You feel dizzy, tiredness (fatigue), or off-balance. Take over-the-counter and prescription medicines only as told by your health care provider. These include  supplements. Eat a healthy diet and maintain a healthy weight. A healthy diet includes low-fat dairy products, low-fat (lean) meats, and fiber from whole grains, beans, and lots of fruits and vegetables. Stay current with your vaccines. Schedule regular health, dental, and eye exams. Summary Having a healthy lifestyle and getting preventive care can help to protect your health and wellness after age 75. Screening and testing are the best way to find a health problem early and help you avoid having a fall. Early diagnosis and treatment give you the best chance for managing medical conditions that are more common for people who are older than age 75. Falls are a major cause of broken bones and head injuries in people who are older than age 75. Take precautions to prevent a fall at home. Work with your health care provider to learn what changes you can make to improve your health and wellness and to prevent falls. This information is not intended to replace advice given to you by your health care provider. Make sure you discuss any questions you have with your health care provider. Document Revised: 08/04/2020 Document Reviewed: 08/04/2020 Elsevier Patient Education  2022 [employer]  

## 2021-06-30 NOTE — Progress Notes (Addendum)
? ?This visit occurred during the SARS-CoV-2 public health emergency.  Safety protocols were in place, including screening questions prior to the visit, additional usage of staff PPE, and extensive cleaning of exam room while observing appropriate contact time as indicated for disinfecting solutions.  ? ? ?Patient ID: Yvette Short, female  DOB: Mar 08, 1946, 76 y.o.   MRN: [id number] ?Patient Care Team  ?  Relationship Specialty Notifications Start End  ?Ma Hillock, DO PCP - General Family Medicine  08/03/17   ?Dian Queen, MD Consulting Physician Obstetrics and Gynecology  08/03/17   ?Tanda Rockers, MD Consulting Physician Pulmonary Disease  08/03/17   ?Lyndal Pulley, DO Consulting Physician Sports Medicine  08/03/17   ?Celedonio Miyamoto, MD Referring Physician Gastroenterology  08/03/17   ?Marica Otter, [place]  Optometry  08/03/17   ?Berniece Salines, DO Consulting Physician Cardiology  06/30/21   ? ? ?Chief Complaint  ?Patient presents with  ? Annual Exam  ?  Pt is fasting  ? ? ?Subjective: ? ?Yvette Short is a 76 y.o.  Female  present for CPE/CMC ?All past medical history, surgical history, allergies, family history, immunizations, medications and social history were updated in the electronic medical record today. ?All recent labs, ED visits and hospitalizations within the last year were reviewed. ? ?Health maintenance:  ?Colonoscopy: completed 2019, by [facility], resutls polyps found. follow up at age 58 ?Mammogram: completed:06/2020, Dr. Helane Rima  ?Cervical cancer screening: n/i ?Immunizations: tdap 2018, Influenza utd 2022 (encouraged yearly), PNA series completed, zostavax completed shingrix, covid completed ?Infectious disease screening:n/i ?DEXA: UTD 2018 (gyn) ?Assistive device: none ?Oxygen YWV:PXTG ?Patient has a Dental home. ?Hospitalizations/ED visits: reviewed ? ?HTN/Mixed hyperlipidemia/aortic atherosclerosis/AR/MR/CAD ?Pt reports compliance  with losartan12.5 mg qd and amlodipine 5 mg qd. Patient denies  chest pain, shortness of breath, dizziness or lower extremity edema.  ?Established with cardio., which prescribed acebutolol. ?RF: [id number] CAD ?08/30/2020-echo: Moderate mitral regurgitation, mild to moderate aortic regurgitation, mild to moderate tricuspid regurgitation. The right-sided pressures - mildly elevated. ?  ?Hypothyroidism, unspecified type ?Patient would like to have medication and management transferred to this provider.  Prior management had been gynecology. ?  ? ? ?  06/02/2021  ?  1:16 PM 01/07/2021  ?  8:38 AM 06/26/2020  ?  8:48 AM 05/07/2020  ?  9:10 AM 12/25/2019  ?  8:46 AM  ?Depression screen PHQ 2/9  ?Decreased Interest 0 0 0 0 1  ?Down, Depressed, Hopeless 0 0 1 0 0  ?PHQ - 2 Score 0 0 1 0 1  ?Altered sleeping  0 1  0  ?Tired, decreased energy  0 1  2  ?Change in appetite  0 2  1  ?Feeling bad or failure about yourself   0 1  0  ?Trouble concentrating  0 0  0  ?Moving slowly or fidgety/restless  0 0  0  ?Suicidal thoughts  0 0  0  ?PHQ-9 Score  0 6  4  ? ? ?  06/26/2020  ?  8:48 AM 12/25/2019  ?  8:46 AM  ?GAD 7 : Generalized Anxiety Score  ?Nervous, Anxious, on Edge 1 0  ?Control/stop worrying 0 0  ?Worry too much - different things 0 0  ?Trouble relaxing 0 0  ?Restless 0 0  ?Easily annoyed or irritable 1 1  ?Afraid - awful might happen 0 0  ?Total GAD 7 Score 2 1  ? ? ?Immunization History  ?Administered Date(s) Administered  ? Fluad Quad(high Dose  65+) 12/25/2019  ? Influenza Split 12/21/2010, 12/21/2011  ? Influenza Whole 01/04/2008, 12/31/2008, 01/14/2010  ? Influenza, High Dose Seasonal PF 01/03/2013, 12/26/2013, 02/12/2016, 12/28/2016, 12/05/2020  ? Influenza-Unspecified 12/06/2017  ? PFIZER(Purple Top)SARS-COV-2 Vaccination 04/22/2019, 05/14/2019, 01/25/2020  ? Pension scheme manager 28yr & up 12/05/2020  ? Pneumococcal Conjugate-13 02/16/2016  ? Pneumococcal Polysaccharide-23 04/10/2012  ? Td 03/09/2001, 12/10/2008  ? Tdap 12/10/2008, 12/28/2016  ? Zoster Recombinat  (Shingrix) 08/03/2017, 12/29/2017, 12/29/2017  ? Zoster, Live 12/21/2010  ? ? ? ?Past Medical History:  ?Diagnosis Date  ? Abnormal colonoscopy 03/2017  ? decreased rectal tone and polyp; 5 year recall  ? Abnormal findings on esophagogastroduodenoscopy (EGD) 02/25/2016  ? Z-line irrg. at 40 cm. No abnomrality of the esophagus to explain dysphagia. Esophagus dilated. H/o + H. Pylori  ? Allergy   ? ANXIETY 07/06/2007  ? Aortic atherosclerosis ([place] 02/08/[place]  ? Aortic valve regurgitation   ? Atherosclerosis of native coronary artery of native heart without angina pectoris 02/08/2018  ? Biceps tendonitis on right   ? Bronchiectasis without complication ([place] 171/21/9758 ? CT 2012 IMPRESSION:  1. Apical scarring may account for the plain film abnormality.  2. A 6 mm right upper lobe pulmonary nodule.  - - HRCT 07/12/2017 >>>  Scattered minimal cylindrical and varicoid bronchiectasis with associated scattered mucoid impaction and minimal tree-in-bud opacity in both lungs, predominantly in the mid to lower lungs. Findings are stable to slightly worsened since 2015 ches  ? Chicken pox   ? Chronic cough 05/18/2017  ? CT chest 03/19/14  No bronchiectasis (not hrct)  Spirometry 05/18/2017  FEV1  2.15 (93%)  Ratio 76 s rx prior  - FENO 05/18/2017  =   Could not perform   - Allergy profile 05/18/2017 >  Eos 0.2 /  IgE  75 RAST pos cat > dog > mold - HRCT 07/12/2017 >>>  See bronchiectasis - trial of dymista 07/26/2017 > some better but not consistent with it > rechallenge rx one puff each am 10/25/2017  - MCT 11/02/17 >    ? Colon polyp 03/31/2017  ? hyperplastic  ? COVID-19 08/04/2020  ? mild  ? Diverticulosis 03/2017  ? sigmoid  ? Family history of ovarian cancer   ? Genetic testing 03/26/2016  ? Negative genetic testing on the [facility] 30 gene panel.  The 30-Gene Cancer Panel offered by [facility] includes sequencing and/or deletion duplication testing of the following 30 genes: APC, ATM, BAP1, BARD1, BMPR1A, BRCA1, BRCA2, BRIP1,  CDH1, CDK4, CDKN2A (p14ARF), CDKN2A (p16INK4a), CHEK2, EPCAM, GREM1, MITF, MLH1, MSH2, MSH6, MUTYH, NBN, PALB2, PMS2, POLD1, POLE, PTEN, RAD51C, RAD51D, SMAD4,   ? GERD 11/09/2006  ? H. pylori infection   ? h/o treated wiht prevpac  ? Hormone replacement therapy 01/08/2019  ? HYPERLIPIDEMIA 11/09/2006  ? Hypertension   ? Hypothyroidism 08/03/2017  ? Lumbar radiculopathy 07/24/2014  ? Migraine   ? Mild depression 02/14/2019  ? Pharyngoesophageal dysphagia 08/03/2017  ? Piriformis syndrome of right side   ? Spinal stenosis, lumbar region, with neurogenic claudication 10/16/2014  ? Epidural 10/02/2014, December 2016   ? Stomach ulcer 2008  ? ?Allergies  ?Allergen Reactions  ? Tetracycline Hcl Rash  ?     ? ?Past Surgical History:  ?Procedure Laterality Date  ? CATARACT EXTRACTION Bilateral 04/2021  ? CERVICAL POLYPECTOMY  01/17/2019  ? LIPOMA EXCISION    ? of back  ? TUBAL LIGATION    ? ?Family History  ?Problem Relation Age of Onset  ?  Dementia Mother   ? Hyperlipidemia Mother   ? Colon cancer Father 29  ? Colon polyps Brother   ?     gets colonoscopy every 2-3 years  ? Hyperlipidemia Brother   ? Hypertension Brother   ? Uterine cancer Maternal Aunt   ?     dx in her 26s  ? Colon cancer Maternal Uncle   ? Colon cancer Paternal Aunt   ? Heart attack Maternal Grandfather   ? Cancer Other   ?     MGMs sister - "abdominal cancer"  ? Breast cancer Cousin   ? ?Social History  ? ?Social History Narrative  ? Married. Two children.   ? College grad.   ? Former smoker.   ? Exercises routinely.   ? Drink caffeine.   ? Wears a hearing aid.   ? Smoke alarm in the home.   ? Wears her seatbelt.   ? Feels safe in her relationships.   ? ? ?Allergies as of 06/30/2021   ? ?   Reactions  ? Tetracycline Hcl Rash  ?    ? ?  ? ?  ?Medication List  ?  ? ?  ? Accurate as of June 30, 2021 11:59 PM. If you have any questions, ask your nurse or doctor.  ?  ?  ? ?  ? ?acebutolol 200 MG capsule ?Commonly known as: SECTRAL ?TAKE 1 CAPSULE BY MOUTH  TWICE A DAY ?  ?amLODipine 5 MG tablet ?Commonly known as: NORVASC ?Take 1 tablet (5 mg total) by mouth daily. ?  ?aspirin EC 81 MG tablet ?Take 81 mg by mouth daily. Swallow whole. ?  ?atorvastatin 40 MG ta

## 2021-07-01 MED ORDER — LEVOTHYROXINE SODIUM 88 MCG PO TABS: 88.0000 ug | ORAL_TABLET | Freq: Every day | ORAL | 3 refills | Status: DC

## 2021-07-01 NOTE — Addendum Note (Signed)
Addended by: Howard Pouch A on: 07/01/2021 04:57 PM ? ? Modules accepted: Orders ? ?

## 2021-07-08 ENCOUNTER — Ambulatory Visit
Admission: RE | Admit: 2021-07-08 | Discharge: 2021-07-08 | Disposition: A | Discharge status: Home / Self Care | Payer: Medicare PPO | Source: Ambulatory Visit | Attending: Obstetrics and Gynecology | Admitting: Obstetrics and Gynecology

## 2021-07-08 DIAGNOSIS — Z1231 Encounter for screening mammogram for malignant neoplasm of breast: Secondary | ICD-10-CM

## 2021-07-14 DIAGNOSIS — H35352 Cystoid macular degeneration, left eye: Secondary | ICD-10-CM | POA: Diagnosis not present

## 2021-07-28 DIAGNOSIS — D692 Other nonthrombocytopenic purpura: Secondary | ICD-10-CM | POA: Diagnosis not present

## 2021-07-28 DIAGNOSIS — L821 Other seborrheic keratosis: Secondary | ICD-10-CM | POA: Diagnosis not present

## 2021-07-28 DIAGNOSIS — R21 Rash and other nonspecific skin eruption: Secondary | ICD-10-CM | POA: Diagnosis not present

## 2021-07-28 DIAGNOSIS — D225 Melanocytic nevi of trunk: Secondary | ICD-10-CM | POA: Diagnosis not present

## 2021-08-12 DIAGNOSIS — L309 Dermatitis, unspecified: Secondary | ICD-10-CM | POA: Diagnosis not present

## 2021-08-12 DIAGNOSIS — R21 Rash and other nonspecific skin eruption: Secondary | ICD-10-CM | POA: Diagnosis not present

## 2021-08-12 DIAGNOSIS — L518 Other erythema multiforme: Secondary | ICD-10-CM | POA: Diagnosis not present

## 2021-08-12 DIAGNOSIS — L308 Other specified dermatitis: Secondary | ICD-10-CM | POA: Diagnosis not present

## 2021-08-25 DIAGNOSIS — H35352 Cystoid macular degeneration, left eye: Secondary | ICD-10-CM | POA: Diagnosis not present

## 2021-10-02 NOTE — Progress Notes (Signed)
[place] Phone: [phone] Subjective:   Yvette Short, am serving as Short scribe for Yvette Short.   I'm seeing this patient by the request  of:  Yvette Short  CC: Right shoulder pain  [id number]  04/27/2021 Saw Dr. Georgina Short the last few visits 76 y.o. female with left arm bruising thought to be proximal biceps long head tendon rupture.  She is basically not symptomatic at this point.  Additionally once I discussed with her the probable diagnosis she does not think that is bad enough to deal with and does not think it is bad enough or bothersome enough to even ultrasound today.  I think this is reasonable and we can treat expectantly and conservatively.  Watchful waiting for now.  Updated 10/08/2021 Yvette Short is Short 76 y.o. female coming in with complaint of R arm pain. Pain over bicep insertion. Pain with flexion and reach out to her side. Did have injection from Dr. Georgina Short at some point which was helpful.  This was back in December when reviewing notes.  Patient states that it is starting to affect him.  Patient states that it does seem to give her more discomfort especially at night and certain movements.  Patient states that sometimes though she does not have pain if she is careful with the movements.       Past Medical History:  Diagnosis Date   Abnormal colonoscopy 03/2017   decreased rectal tone and polyp; 5 year recall   Abnormal findings on esophagogastroduodenoscopy (EGD) 02/25/2016   Z-line irrg. at 40 cm. Short abnomrality of the esophagus to explain dysphagia. Esophagus dilated. H/o + H. Pylori   Allergy    ANXIETY 07/06/2007   Aortic atherosclerosis ([place]) 02/08/2018   Aortic valve regurgitation    Atherosclerosis of native coronary artery of native heart without angina pectoris 02/08/2018   Biceps tendonitis on right    Bronchiectasis without complication ([place]) 62/13/0865   CT  2012 IMPRESSION:  1. Apical scarring may account for the plain film abnormality.  2. Short 6 mm right upper lobe pulmonary nodule.  - - HRCT 07/12/2017 >>>  Scattered minimal cylindrical and varicoid bronchiectasis with associated scattered mucoid impaction and minimal tree-in-bud opacity in both lungs, predominantly in the mid to lower lungs. Findings are stable to slightly worsened since 2015 ches   Chicken pox    Chronic cough 05/18/2017   CT chest 03/19/14  Short bronchiectasis (not hrct)  Spirometry 05/18/2017  FEV1  2.15 (93%)  Ratio 76 s rx prior  - FENO 05/18/2017  =   Could not perform   - Allergy profile 05/18/2017 >  Eos 0.2 /  IgE  76 RAST pos cat > dog > mold - HRCT 07/12/2017 >>>  See bronchiectasis - trial of dymista 07/26/2017 > some better but not consistent with it > rechallenge rx one puff each am 10/25/2017  - MCT 11/02/17 >     Colon polyp 03/31/2017   hyperplastic   COVID-19 08/04/2020   mild   Diverticulosis 03/2017   sigmoid   Family history of ovarian cancer    Genetic testing 03/26/2016   Negative genetic testing on the [facility] 30 gene panel.  The 30-Gene Cancer Panel offered by [facility] includes sequencing and/or deletion duplication testing of the following 30 genes: APC, ATM, BAP1, BARD1, BMPR1A, BRCA1, BRCA2, BRIP1, CDH1, CDK4, CDKN2A (p14ARF), CDKN2A (p16INK4a), CHEK2, EPCAM, GREM1, MITF, MLH1, MSH2,  MSH6, MUTYH, NBN, PALB2, PMS2, POLD1, POLE, PTEN, RAD51C, RAD51D, SMAD4,    GERD 11/09/2006   H. pylori infection    h/o treated wiht prevpac   Hormone replacement therapy 01/08/2019   HYPERLIPIDEMIA 11/09/2006   Hypertension    Hypothyroidism 08/03/2017   Lumbar radiculopathy 07/24/2014   Migraine    Mild depression 02/14/2019   Pharyngoesophageal dysphagia 08/03/2017   Piriformis syndrome of right side    Spinal stenosis, lumbar region, with neurogenic claudication 10/16/2014   Epidural 10/02/2014, December 2016    Stomach ulcer 2008   Past Surgical History:  Procedure  Laterality Date   CATARACT EXTRACTION Bilateral 04/2021   CERVICAL POLYPECTOMY  01/17/2019   LIPOMA EXCISION     of back   TUBAL LIGATION     Social History   Socioeconomic History   Marital status: Married    Spouse name: Not on file   Number of children: Not on file   Years of education: Not on file   Highest education level: Not on file  Occupational History   Not on file  Tobacco Use   Smoking status: Former    Packs/day: 2.50    Years: 13.00    Total pack years: 32.50    Types: Cigarettes    Quit date: 07/28/1974    Years since quitting: 47.2   Smokeless tobacco: Never  Vaping Use   Vaping Use: Never used  Substance and Sexual Activity   Alcohol use: Yes    Alcohol/week: 2.0 standard drinks of alcohol    Types: 2 Cans of beer per week   Drug use: Short   Sexual activity: Yes    Partners: Male  Other Topics Concern   Not on file  Social History Narrative   Married. Two children.    College grad.    Former smoker.    Exercises routinely.    Drink caffeine.    Wears Short hearing aid.    Smoke alarm in the home.    Wears her seatbelt.    Feels safe in her relationships.    Social Determinants of Health   Financial Resource Strain: Low Risk  (06/02/2021)   Overall Financial Resource Strain (CARDIA)    Difficulty of Paying Living Expenses: Not hard at all  Food Insecurity: Short Food Insecurity (06/02/2021)   Hunger Vital Sign    Worried About Running Out of Food in the Last Year: Never true    Ran Out of Food in the Last Year: Never true  Transportation Needs: Short Transportation Needs (06/02/2021)   PRAPARE - Hydrologist (Medical): Short    Lack of Transportation (Non-Medical): Short  Physical Activity: Sufficiently Active (06/02/2021)   Exercise Vital Sign    Days of Exercise per Week: 3 days    Minutes of Exercise per Session: 60 min  Stress: Short Stress Concern Present (06/02/2021)   [place]    Feeling of Stress : Not at all  Social Connections: Moderately Isolated (06/02/2021)   Social Connection and Isolation Panel [NHANES]    Frequency of Communication with Friends and Family: Once Short week    Frequency of Social Gatherings with Friends and Family: Once Short week    Attends Religious Services: More than 4 times per year    Active Member of [employer] or Organizations: Short    Attends Archivist Meetings: Never    Marital Status: Married   Allergies  Allergen  Reactions   Tetracycline Hcl Rash        Family History  Problem Relation Age of Onset   Dementia Mother    Hyperlipidemia Mother    Colon cancer Father 51   Colon polyps Brother        gets colonoscopy every 2-3 years   Hyperlipidemia Brother    Hypertension Brother    Uterine cancer Maternal Aunt        dx in her 42s   Colon cancer Maternal Uncle    Colon cancer Paternal Aunt    Heart attack Maternal Grandfather    Cancer Other        MGMs sister - "abdominal cancer"   Breast cancer Cousin     Current Outpatient Medications (Endocrine & Metabolic):    estradiol (VIVELLE-DOT) 0.0375 MG/24HR, Place 1 patch onto the skin 2 (two) times Short week.   levothyroxine (SYNTHROID) 88 MCG tablet, Take 1 tablet (88 mcg total) by mouth daily.   progesterone (PROMETRIUM) 100 MG capsule, Take by mouth.  Current Outpatient Medications (Cardiovascular):    acebutolol (SECTRAL) 200 MG capsule, TAKE 1 CAPSULE BY MOUTH TWICE Short DAY   amLODipine (NORVASC) 5 MG tablet, Take 1 tablet (5 mg total) by mouth daily.   atorvastatin (LIPITOR) 40 MG tablet, TAKE 1 TABLET BY MOUTH EVERY DAY   losartan (COZAAR) 25 MG tablet, Take 0.5 tablets (12.5 mg total) by mouth daily.  Current Outpatient Medications (Respiratory):    chlorpheniramine (CHLOR-TRIMETON) 4 MG tablet, Take 4 mg by mouth every 4 (four) hours as needed for allergies.  Current Outpatient Medications (Analgesics):    aspirin EC 81 MG tablet, Take 81 mg by  mouth daily. Swallow whole.   Current Outpatient Medications (Other):    Ergocalciferol (VITAMIN D2) 2000 units TABS, Take 1 tablet by mouth daily.   gabapentin (NEURONTIN) 100 MG capsule, TAKE 2-3 CAPSULES (200-300 MG TOTAL) BY MOUTH AT BEDTIME.   Probiotic Product (PROBIOTIC-10 PO), Take by mouth.   Reviewed prior external information including notes and imaging from  primary care provider As well as notes that were available from care everywhere and other healthcare systems.  Past medical history, social, surgical and family history all reviewed in electronic medical record.  Short pertanent information unless stated regarding to the chief complaint.   Review of Systems:  Short headache, visual changes, nausea, vomiting, diarrhea, constipation, dizziness, abdominal pain, skin rash, fevers, chills, night sweats, weight loss, swollen lymph nodes, body aches, joint swelling, chest pain, shortness of breath, mood changes. POSITIVE muscle aches  Objective  Blood pressure 122/72, pulse 75, height 5' 4.5" (1.638 m), weight 128 lb (58.1 kg), SpO2 98 %.   General: Short apparent distress alert and oriented x3 mood and affect normal, dressed appropriately.  HEENT: Pupils equal, extraocular movements intact  Respiratory: Patient's speak in full sentences and does not appear short of breath  Cardiovascular: Short lower extremity edema, non tender, Short erythema  Patient's right shoulder does have some mild positive impingement noted.  Rotator cuff strength does appear to be intact.  Does have some hesitation in certain movements.  Does have difficulty with external range of motion  Limited muscular skeletal ultrasound was performed and interpreted by Yvette Short, M  Limited ultrasound shows the patient has some mild hypoechoic changes around the supraspinatus.  Short true tear but likely some interstitial tearing noted.  Difficult to assess patient noted does have hypoechoic changes in the glenohumeral joint is  significant with Short small effusion.  Bicep tendon appears to be unremarkable. Impression: Of small effusion of the glenohumeral joint  Procedure: Real-time Ultrasound Guided Injection of right glenohumeral joint Device: GE Logiq Q7  Ultrasound guided injection is preferred based studies that show increased duration, increased effect, greater accuracy, decreased procedural pain, increased response rate with ultrasound guided versus blind injection.  Verbal informed consent obtained.  Time-out conducted.  Noted Short overlying erythema, induration, or other signs of local infection.  Skin prepped in Short sterile fashion.  Local anesthesia: Topical Ethyl chloride.  With sterile technique and under real time ultrasound guidance:  Joint visualized.  23g 1  inch needle inserted posterior approach. Pictures taken for needle placement. Patient did have injection of 2 cc of 1% lidocaine, 2 cc of 0.5% Marcaine, and 1.0 cc of Kenalog 40 mg/dL. Completed without difficulty  Pain immediately resolved suggesting accurate placement of the medication.  Advised to call if fevers/chills, erythema, induration, drainage, or persistent bleeding.  Impression: Technically successful ultrasound guided injection.    Impression and Recommendations:    The above documentation has been reviewed and is accurate and complete Lyndal Pulley, Short

## 2021-10-08 ENCOUNTER — Ambulatory Visit: Payer: Medicare PPO | Admitting: Family Medicine

## 2021-10-08 ENCOUNTER — Encounter: Payer: Self-pay | Admitting: Family Medicine

## 2021-10-08 ENCOUNTER — Ambulatory Visit: Payer: Self-pay

## 2021-10-08 VITALS — BP 122/72 | HR 75 | Ht 64.5 in | Wt 128.0 lb

## 2021-10-08 DIAGNOSIS — M79601 Pain in right arm: Secondary | ICD-10-CM

## 2021-10-08 DIAGNOSIS — M7581 Other shoulder lesions, right shoulder: Secondary | ICD-10-CM

## 2021-10-08 NOTE — Patient Instructions (Signed)
Injection today Do prescribed exercises at least 3x a week See you again in 6-8 weeks

## 2021-10-09 ENCOUNTER — Encounter: Payer: Self-pay | Admitting: Family Medicine

## 2021-10-09 DIAGNOSIS — M7581 Other shoulder lesions, right shoulder: Secondary | ICD-10-CM | POA: Insufficient documentation

## 2021-10-09 NOTE — Assessment & Plan Note (Addendum)
Patient given injection and tolerated the procedure well, discussed icing regimen and home exercises, discussed which activities to do and which ones to avoid, increase activity slowly otherwise.  Follow-up again in 6 to 8 weeks Patient does have an MRI showing some very small interstitial tearing previously.  I do not see anything new that would make me concerning for anything else at the moment.  Patient though could be a candidate for PRP.

## 2021-11-17 ENCOUNTER — Encounter: Payer: Self-pay | Admitting: Family Medicine

## 2021-11-17 ENCOUNTER — Ambulatory Visit (INDEPENDENT_AMBULATORY_CARE_PROVIDER_SITE_OTHER): Payer: Medicare PPO | Admitting: Family Medicine

## 2021-11-17 VITALS — BP 159/77 | HR 68 | Temp 98.3°F | Ht 64.5 in | Wt 128.0 lb

## 2021-11-17 DIAGNOSIS — R41840 Attention and concentration deficit: Secondary | ICD-10-CM | POA: Diagnosis not present

## 2021-11-17 DIAGNOSIS — R55 Syncope and collapse: Secondary | ICD-10-CM

## 2021-11-17 DIAGNOSIS — I493 Ventricular premature depolarization: Secondary | ICD-10-CM | POA: Diagnosis not present

## 2021-11-17 NOTE — Patient Instructions (Signed)
Syncope, Adult  Syncope is when you pass out or faint for a short time. It is caused by a sudden decrease in blood flow to the brain. This can happen for many reasons. It can sometimes happen when seeing blood, getting a shot (injection), or having pain or strong emotions. Most causes of fainting are not dangerous, but in some cases it can be a sign of a serious medical problem. If you faint, get help right away. Call your local emergency services (911 in the [place] Follow these instructions at home: Watch for any changes in your symptoms. Take these actions to stay safe and help with your symptoms: Knowing when you may be about to faint Signs that you may be about to faint include: Feeling dizzy or light-headed. It may feel like the room is spinning. Feeling weak. Feeling like you may vomit (nauseous). Seeing spots or seeing all white or all black. Having cold, clammy skin. Feeling warm and sweaty. Hearing ringing in the ears. If you start to feel like you might faint, sit or lie down right away. If sitting, lower your head down between your legs. If lying down, raise (elevate) your feet above the level of your heart. Breathe deeply and steadily. Wait until all of the symptoms are gone. Have someone stay with you until you feel better. Medicines Take over-the-counter and prescription medicines only as told by your doctor. If you are taking blood pressure or heart medicine, sit up and stand up slowly. Spend a few minutes getting ready to sit and then stand. This can help you feel less dizzy. Lifestyle Do not drive, use machinery, or play sports until your doctor says it is okay. Do not drink alcohol. Do not smoke or use any products that contain nicotine or tobacco. If you need help quitting, ask your doctor. Avoid hot tubs and saunas. General instructions Talk with your doctor about your symptoms. You may need to have testing to help find the cause. Drink enough fluid to keep your pee  (urine) pale yellow. Avoid standing for a long time. If you must stand for a long time, do movements such as: Moving your legs. Crossing your legs. Flexing and stretching your leg muscles. Squatting. Keep all follow-up visits. Contact a doctor if: You have episodes of near fainting. Get help right away if: You pass out or faint. You hit your head or are injured after fainting. You have any of these symptoms: Fast or uneven heartbeats (palpitations). Pain in your chest, belly, or back. Shortness of breath. You have jerky movements that you cannot control (seizure). You have a very bad headache. You are confused. You have problems with how you see (vision). You are very weak. You have trouble walking. You are bleeding from your mouth or your butt (rectum). You have black or tarry poop (stool). These symptoms may be an emergency. Get help right away. Call your local emergency services (911 in the [place] Do not wait to see if the symptoms will go away. Do not drive yourself to the hospital. Summary Syncope is when you pass out or faint for a short time. It is caused by a sudden decrease in blood flow to the brain. Signs that you may be about to faint include feeling dizzy or light-headed, feeling like you may vomit, seeing all white or all black, or having cold, clammy skin. If you start to feel like you might faint, sit or lie down right away. Lower your head if sitting, or raise (elevate)   your feet if lying down. Breathe deeply and steadily. Wait until all of the symptoms are gone. This information is not intended to replace advice given to you by your health care provider. Make sure you discuss any questions you have with your health care provider. Document Revised: 07/24/2020 Document Reviewed: 07/24/2020 Elsevier Patient Education  2023 [employer]  

## 2021-11-17 NOTE — Progress Notes (Unsigned)
Yvette Short , [birth year], 76 y.o., female MRN: [id number] Patient Care Team    Relationship Specialty Notifications Start End  Ma Hillock, DO PCP - General Family Medicine  08/03/17   Dian Queen, MD Consulting Physician Obstetrics and Gynecology  08/03/17   Tanda Rockers, MD Consulting Physician Pulmonary Disease  08/03/17   Lyndal Pulley, DO Consulting Physician Sports Medicine  08/03/17   Celedonio Miyamoto, MD Referring Physician Gastroenterology  08/03/17   Marica Otter, OD  Optometry  08/03/17   Berniece Salines, DO Consulting Physician Cardiology  06/30/21     Chief Complaint  Patient presents with   driving concerns    Pt recently has found herself driving against traffic; denies memory changes     Subjective: Pt presents for an OV with complaints of *** of *** duration.  Associated symptoms include ***.  Pt has tried *** to ease their symptoms.      06/02/2021    1:16 PM 01/07/2021    8:38 AM 06/26/2020    8:48 AM 05/07/2020    9:10 AM 12/25/2019    8:46 AM  Depression screen PHQ 2/9  Decreased Interest 0 0 0 0 1  Down, Depressed, Hopeless 0 0 1 0 0  PHQ - 2 Score 0 0 1 0 1  Altered sleeping  0 1  0  Tired, decreased energy  0 1  2  Change in appetite  0 2  1  Feeling bad or failure about yourself   0 1  0  Trouble concentrating  0 0  0  Moving slowly or fidgety/restless  0 0  0  Suicidal thoughts  0 0  0  PHQ-9 Score  0 6  4    Allergies  Allergen Reactions   Tetracycline Hcl Rash        Social History   Social History Narrative   Married. Two children.    College grad.    Former smoker.    Exercises routinely.    Drink caffeine.    Wears a hearing aid.    Smoke alarm in the home.    Wears her seatbelt.    Feels safe in her relationships.    Past Medical History:  Diagnosis Date   Abnormal colonoscopy 03/2017   decreased rectal tone and polyp; 5 year recall   Abnormal findings on esophagogastroduodenoscopy (EGD) 02/25/2016   Z-line  irrg. at 40 cm. No abnomrality of the esophagus to explain dysphagia. Esophagus dilated. H/o + H. Pylori   Allergy    ANXIETY 07/06/2007   Aortic atherosclerosis ([place]) 02/08/2018   Aortic valve regurgitation    Atherosclerosis of native coronary artery of native heart without angina pectoris 02/08/2018   Biceps tendonitis on right    Bronchiectasis without complication ([place]) 15/40/0867   CT 2012 IMPRESSION:  1. Apical scarring may account for the plain film abnormality.  2. A 6 mm right upper lobe pulmonary nodule.  - - HRCT 07/12/2017 >>>  Scattered minimal cylindrical and varicoid bronchiectasis with associated scattered mucoid impaction and minimal tree-in-bud opacity in both lungs, predominantly in the mid to lower lungs. Findings are stable to slightly worsened since 2015 ches   Chicken pox    Chronic cough 05/18/2017   CT chest 03/19/14  No bronchiectasis (not hrct)  Spirometry 05/18/2017  FEV1  2.15 (93%)  Ratio 76 s rx prior  - FENO 05/18/2017  =   Could not perform   - Allergy  profile 05/18/2017 >  Eos 0.2 /  IgE  76 RAST pos cat > dog > mold - HRCT 07/12/2017 >>>  See bronchiectasis - trial of dymista 07/26/2017 > some better but not consistent with it > rechallenge rx one puff each am 10/25/2017  - MCT 11/02/17 >     Colon polyp 03/31/2017   hyperplastic   COVID-19 08/04/2020   mild   Diverticulosis 03/2017   sigmoid   Family history of ovarian cancer    Genetic testing 03/26/2016   Negative genetic testing on the Color 30 gene panel.  The 30-Gene Cancer Panel offered by [facility] includes sequencing and/or deletion duplication testing of the following 30 genes: APC, ATM, BAP1, BARD1, BMPR1A, BRCA1, BRCA2, BRIP1, CDH1, CDK4, CDKN2A (p14ARF), CDKN2A (p16INK4a), CHEK2, EPCAM, GREM1, MITF, MLH1, MSH2, MSH6, MUTYH, NBN, PALB2, PMS2, POLD1, POLE, PTEN, RAD51C, RAD51D, SMAD4,    GERD 11/09/2006   H. pylori infection    h/o treated wiht prevpac   Hormone replacement therapy 01/08/2019    HYPERLIPIDEMIA 11/09/2006   Hypertension    Hypothyroidism 08/03/2017   Lumbar radiculopathy 07/24/2014   Migraine    Mild depression 02/14/2019   Pharyngoesophageal dysphagia 08/03/2017   Piriformis syndrome of right side    Spinal stenosis, lumbar region, with neurogenic claudication 10/16/2014   Epidural 10/02/2014, December 2016    Stomach ulcer 2008   Past Surgical History:  Procedure Laterality Date   CATARACT EXTRACTION Bilateral 04/2021   CERVICAL POLYPECTOMY  01/17/2019   LIPOMA EXCISION     of back   TUBAL LIGATION     Family History  Problem Relation Age of Onset   Dementia Mother    Hyperlipidemia Mother    Colon cancer Father 94   Colon polyps Brother        gets colonoscopy every 2-3 years   Hyperlipidemia Brother    Hypertension Brother    Uterine cancer Maternal Aunt        dx in her 44s   Colon cancer Maternal Uncle    Colon cancer Paternal Aunt    Heart attack Maternal Grandfather    Cancer Other        MGMs sister - "abdominal cancer"   Breast cancer Cousin    Allergies as of 11/17/2021       Reactions   Tetracycline Hcl Rash            Medication List        Accurate as of November 17, 2021  4:14 PM. If you have any questions, ask your nurse or doctor.          STOP taking these medications    PROBIOTIC-10 PO Stopped by: Howard Pouch, DO       TAKE these medications    acebutolol 200 MG capsule Commonly known as: SECTRAL TAKE 1 CAPSULE BY MOUTH TWICE A DAY What changed:  how much to take how to take this when to take this   amLODipine 5 MG tablet Commonly known as: NORVASC Take 1 tablet (5 mg total) by mouth daily.   aspirin EC 81 MG tablet Take 81 mg by mouth daily. Swallow whole.   atorvastatin 40 MG tablet Commonly known as: LIPITOR TAKE 1 TABLET BY MOUTH EVERY DAY   b complex vitamins capsule Take 1 capsule by mouth daily.   BORON PO Take by mouth.   CALCIUM & VIT D3 BONE HEALTH PO Take by mouth.    chlorpheniramine 4 MG tablet Commonly known as:  CHLOR-TRIMETON Take 4 mg by mouth every 4 (four) hours as needed for allergies.   DIALYVITE VITAMIN D 5000 PO Take by mouth.   estradiol 0.0375 MG/24HR Commonly known as: VIVELLE-DOT Place 1 patch onto the skin 2 (two) times a week.   gabapentin 100 MG capsule Commonly known as: NEURONTIN TAKE 2-3 CAPSULES (200-300 MG TOTAL) BY MOUTH AT BEDTIME.   levothyroxine 88 MCG tablet Commonly known as: SYNTHROID Take 1 tablet (88 mcg total) by mouth daily.   losartan 25 MG tablet Commonly known as: COZAAR Take 0.5 tablets (12.5 mg total) by mouth daily.   MAGNESIUM PO Take by mouth.   progesterone 100 MG capsule Commonly known as: PROMETRIUM Take by mouth.   Vitamin D2 50 MCG (2000 UT) Tabs Take 1 tablet by mouth daily.   VITAMIN K PO Take by mouth.        All past medical history, surgical history, allergies, family history, immunizations andmedications were updated in the EMR today and reviewed under the history and medication portions of their EMR.     ROS Negative, with the exception of above mentioned in HPI   Objective:  BP (!) 159/77   Pulse 68   Temp 98.3 F (36.8 C) (Oral)   Ht 5' 4.5" (1.638 m)   Wt 128 lb (58.1 kg)   SpO2 100%   BMI 21.63 kg/m  Body mass index is 21.63 kg/m. Physical Exam ***  No results found. No results found. No results found for this or any previous visit (from the past 24 hour(s)).  Assessment/Plan: CARLEN REBUCK is a 76 y.o. female present for OV for  ***   Reviewed expectations re: course of current medical issues. Discussed self-management of symptoms. Outlined signs and symptoms indicating need for more acute intervention. Patient verbalized understanding and all questions were answered. Patient received an After-Visit Summary.    Orders Placed This Encounter  Procedures   TSH   Vitamin D (25 hydroxy)   B12   Comp Met (CMET)   Ambulatory referral to  Cardiology   EKG 12-Lead   EKG 12-Lead   No orders of the defined types were placed in this encounter.  Referral Orders         Ambulatory referral to Cardiology       Note is dictated utilizing voice recognition software. Although note has been proof read prior to signing, occasional typographical errors still can be missed. If any questions arise, please do not hesitate to call for verification.   electronically signed by:  Howard Pouch, DO  [place]

## 2021-11-18 ENCOUNTER — Telehealth: Payer: Self-pay | Admitting: Family Medicine

## 2021-11-18 ENCOUNTER — Encounter: Payer: Self-pay | Admitting: Family Medicine

## 2021-11-18 ENCOUNTER — Other Ambulatory Visit: Payer: Self-pay | Admitting: Family Medicine

## 2021-11-18 DIAGNOSIS — R55 Syncope and collapse: Secondary | ICD-10-CM

## 2021-11-18 DIAGNOSIS — R41840 Attention and concentration deficit: Secondary | ICD-10-CM

## 2021-11-18 LAB — VITAMIN D 25 HYDROXY (VIT D DEFICIENCY, FRACTURES): Vit D, 25-Hydroxy: 59 ng/mL (ref 30–100)

## 2021-11-18 LAB — TSH: TSH: 2.46 mIU/L (ref 0.40–4.50)

## 2021-11-18 LAB — COMPREHENSIVE METABOLIC PANEL
ALT: 22 U/L (ref 0–35)
AST: 22 U/L (ref 0–37)
Albumin: 4.6 g/dL (ref 3.5–5.2)
Alkaline Phosphatase: 73 U/L (ref 39–117)
BUN: 16 mg/dL (ref 6–23)
CO2: 29 mEq/L (ref 19–32)
Calcium: 10 mg/dL (ref 8.4–10.5)
Chloride: 102 mEq/L (ref 96–112)
Creatinine, Ser: 1.05 mg/dL (ref 0.40–1.20)
GFR: 52.14 mL/min — ABNORMAL LOW (ref >60.00)
Glucose, Bld: 86 mg/dL (ref 70–99)
Potassium: 4.2 mEq/L (ref 3.5–5.1)
Sodium: 138 mEq/L (ref 135–145)
Total Bilirubin: 0.6 mg/dL (ref 0.2–1.2)
Total Protein: 6.7 g/dL (ref 6.0–8.3)

## 2021-11-18 LAB — VITAMIN B12: Vitamin B-12: 432 pg/mL (ref 200–1100)

## 2021-11-18 NOTE — Telephone Encounter (Signed)
Please inform patient Her vitamin D and thyroid levels are normal. B12 level is on the low normal side.  She may benefit from a memory/energy standpoint to supplement B12 daily with OTC B12 [phone] mcg daily.  I do suspect her loss of consciousness is most likely caused by her heart.  She should be hearing from her cardiologist very soon if she has not heard from them by the end of the week please have her call back here so we can check into that for her.  If she has any further loss of consciousness or chest pain, call 911.  If cause is an arrhythmia, this can lead to a cardiovascular event such as heart attack or stroke.   I have also referred her to neurology.  When people have a syncopal event we look at both cardiac and neurological causes.  They may also consider a sleep study through neurology.

## 2021-11-18 NOTE — Progress Notes (Signed)
New referral for neurology placed.  [facility] does not have sleep specialist.

## 2021-11-18 NOTE — Telephone Encounter (Signed)
Spoke with pt regarding labs and instructions.   

## 2021-11-19 ENCOUNTER — Telehealth: Payer: Self-pay | Admitting: Family Medicine

## 2021-11-19 NOTE — Telephone Encounter (Signed)
Spoke with pt regarding labs and instructions.   

## 2021-11-19 NOTE — Telephone Encounter (Signed)
LVM for pt to CB regarding results.  

## 2021-11-19 NOTE — Telephone Encounter (Signed)
Patient returning call she was unsure what the message said. She could not tell me any details of her call just that she was returning a call.

## 2021-11-24 ENCOUNTER — Ambulatory Visit: Payer: Medicare PPO | Admitting: Nurse Practitioner
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
## 2021-11-26 ENCOUNTER — Ambulatory Visit: Payer: Medicare PPO | Admitting: Neurology

## 2021-11-26 ENCOUNTER — Ambulatory Visit: Payer: Medicare PPO | Attending: Nurse Practitioner | Admitting: Nurse Practitioner

## 2021-11-26 ENCOUNTER — Encounter: Payer: Self-pay | Admitting: Neurology

## 2021-11-26 ENCOUNTER — Encounter: Payer: Self-pay | Admitting: Nurse Practitioner

## 2021-11-26 VITALS — BP 130/80 | HR 59 | Ht 64.0 in | Wt 124.4 lb

## 2021-11-26 VITALS — BP 142/84 | HR 60 | Ht 64.5 in | Wt 128.0 lb

## 2021-11-26 DIAGNOSIS — I2510 Atherosclerotic heart disease of native coronary artery without angina pectoris: Secondary | ICD-10-CM | POA: Diagnosis not present

## 2021-11-26 DIAGNOSIS — R0683 Snoring: Secondary | ICD-10-CM | POA: Diagnosis not present

## 2021-11-26 DIAGNOSIS — I491 Atrial premature depolarization: Secondary | ICD-10-CM | POA: Diagnosis not present

## 2021-11-26 DIAGNOSIS — R4020 Unspecified coma: Secondary | ICD-10-CM

## 2021-11-26 DIAGNOSIS — I471 Supraventricular tachycardia: Secondary | ICD-10-CM

## 2021-11-26 DIAGNOSIS — G478 Other sleep disorders: Secondary | ICD-10-CM | POA: Diagnosis not present

## 2021-11-26 DIAGNOSIS — I493 Ventricular premature depolarization: Secondary | ICD-10-CM

## 2021-11-26 DIAGNOSIS — R001 Bradycardia, unspecified: Secondary | ICD-10-CM | POA: Diagnosis not present

## 2021-11-26 DIAGNOSIS — T733XXD Exhaustion due to excessive exertion, subsequent encounter: Secondary | ICD-10-CM

## 2021-11-26 DIAGNOSIS — I4729 Other ventricular tachycardia: Secondary | ICD-10-CM

## 2021-11-26 DIAGNOSIS — I10 Essential (primary) hypertension: Secondary | ICD-10-CM | POA: Diagnosis not present

## 2021-11-26 DIAGNOSIS — I351 Nonrheumatic aortic (valve) insufficiency: Secondary | ICD-10-CM

## 2021-11-26 DIAGNOSIS — E785 Hyperlipidemia, unspecified: Secondary | ICD-10-CM

## 2021-11-26 DIAGNOSIS — I340 Nonrheumatic mitral (valve) insufficiency: Secondary | ICD-10-CM | POA: Diagnosis not present

## 2021-11-26 DIAGNOSIS — I951 Orthostatic hypotension: Secondary | ICD-10-CM

## 2021-11-26 DIAGNOSIS — R55 Syncope and collapse: Secondary | ICD-10-CM

## 2021-11-26 LAB — MAGNESIUM: Magnesium: 2.3 mg/dL (ref 1.6–2.3)

## 2021-11-26 NOTE — Patient Instructions (Signed)
Medication Instructions:  Your physician recommends that you continue on your current medications as directed. Please refer to the Current Medication list given to you today.   *If you need a refill on your cardiac medications before your next appointment, please call your pharmacy*   Lab Work: Your physician recommends that you complete labs today Magnesium  If you have labs (blood work) drawn today and your tests are completely normal, you will receive your results only by: [place] (if you have MyChart) OR A paper copy in the mail If you have any lab test that is abnormal or we need to change your treatment, we will call you to review the results.   Testing/Procedures: Your physician has requested that you have an echocardiogram. Echocardiography is a painless test that uses sound waves to create images of your heart. It provides your doctor with information about the size and shape of your heart and how well your heart's chambers and valves are working. This procedure takes approximately one hour. There are no restrictions for this procedure.   Your physician has recommended that you wear an event monitor (30 day). Event monitors are medical devices that record the heart's electrical activity. Doctors most often [place] these monitors to diagnose arrhythmias. Arrhythmias are problems with the speed or rhythm of the heartbeat. The monitor is a small, portable device. You can wear one while you do your normal daily activities. This is usually used to diagnose what is causing palpitations/syncope (passing out).   Your physician has requested that you have a carotid duplex (doppler). This test is an ultrasound of the carotid arteries in your neck. It looks at blood flow through these arteries that supply the brain with blood. Allow one hour for this exam. There are no restrictions or special instructions.  Follow-Up: At [facility], you and your health needs are our priority.  As  part of our continuing mission to provide you with exceptional heart care, we have created designated Provider Care Teams.  These Care Teams include your primary Cardiologist (physician) and Advanced Practice Providers (APPs -  Physician Assistants and Nurse Practitioners) who all work together to provide you with the care you need, when you need it.  We recommend signing up for the patient portal called "MyChart".  Sign up information is provided on this After Visit Summary.  MyChart is used to connect with patients for Virtual Visits (Telemedicine).  Patients are able to view lab/test results, encounter notes, upcoming appointments, etc.  Non-urgent messages can be sent to your provider as well.   To learn more about what you can do with MyChart, go to [email or web address].    Your next appointment:    Keep scheduled appointment  The format for your next appointment:   In Person  Provider:   Berniece Salines, DO     Other Instructions   Important Information About Sugar
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
## 2021-11-26 NOTE — Patient Instructions (Signed)
Syncope, Adult  Syncope refers to a condition in which a person temporarily loses consciousness. Syncope may also be called fainting or passing out. It is caused by a sudden decrease in blood flow to the brain. This can happen for a variety of reasons. Most causes of syncope are not dangerous. It can be triggered by things such as needle sticks, seeing blood, pain, or intense emotion. However, syncope can also be a sign of a serious medical problem, such as a heart abnormality. Other causes can include dehydration, migraines, or taking medicines that lower blood pressure. Your health care provider may do tests to find the reason why you are having syncope. If you faint, get medical help right away. Call your local emergency services (911 in the [place] Follow these instructions at home: Pay attention to any changes in your symptoms. Take these actions to stay safe and to help relieve your symptoms: Knowing when you may be about to faint Signs that you may be about to faint include: Feeling dizzy, weak, light-headed, or like the room is spinning. Feeling nauseous. Seeing spots or seeing all white or all black in your field of vision. Having cold, clammy skin or feeling warm and sweaty. Hearing ringing in the ears (tinnitus). If you start to feel like you might faint, sit or lie down right away. If sitting, put your head down between your legs. If lying down, raise (elevate) your feet above the level of your heart. Breathe deeply and steadily. Wait until all the symptoms have passed. Have someone stay with you until you feel stable. Medicines Take over-the-counter and prescription medicines only as told by your health care provider. If you are taking blood pressure or heart medicine, get up slowly and take several minutes to sit and then stand. This can reduce dizziness and decrease the risk of syncope. Lifestyle Do not drive, use machinery, or play sports until your health care provider says it is  okay. Do not drink alcohol. Do not use any products that contain nicotine or tobacco. These products include cigarettes, chewing tobacco, and vaping devices, such as e-cigarettes. If you need help quitting, ask your health care provider. Avoid hot tubs and saunas. General instructions Talk with your health care provider about your symptoms. You may need to have testing to understand the cause of your syncope. Drink enough fluid to keep your urine pale yellow. Avoid prolonged standing. If you must stand for a long time, do movements such as: Moving your legs. Crossing your legs. Flexing and stretching your leg muscles. Squatting. Keep all follow-up visits. This is important. Contact a health care provider if: You have episodes of near fainting. Get help right away if: You faint. You hit your head or are injured after fainting. You have any of these symptoms that may indicate trouble with your heart: Fast or irregular heartbeats (palpitations). Unusual pain in your chest, abdomen, or back. Shortness of breath. You have a seizure. You have a severe headache. You are confused. You have vision problems. You have severe weakness or trouble walking. You are bleeding from your mouth or rectum, or you have black or tarry stool. These symptoms may represent a serious problem that is an emergency. Do not wait to see if your symptoms will go away. Get medical help right away. Call your local emergency services (911 in the [place] Do not drive yourself to the hospital. Summary Syncope refers to a condition in which a person temporarily loses consciousness. Syncope may also be called   fainting or passing out. It is caused by a sudden decrease in blood flow to the brain. Signs that you may be about to faint include dizziness, feeling light-headed, feeling nauseous, sudden vision changes, or cold, clammy skin. Even though most causes of syncope are not dangerous, syncope can be a sign of a serious  medical problem. Get help right away if you faint. If you start to feel like you might faint, sit or lie down right away. If sitting, put your head down between your legs. If lying down, raise (elevate) your feet above the level of your heart. This information is not intended to replace advice given to you by your health care provider. Make sure you discuss any questions you have with your health care provider. Document Revised: 07/24/2020 Document Reviewed: 07/24/2020 Elsevier Patient Education  2023 Elsevier Inc.  

## 2021-12-01 ENCOUNTER — Telehealth: Payer: Self-pay

## 2021-12-01 NOTE — Telephone Encounter (Signed)
Spoke with pt. Pt was notified of lab results. Pt will follow up as planned.  

## 2021-12-02 ENCOUNTER — Ambulatory Visit (HOSPITAL_COMMUNITY)
Admission: RE | Admit: 2021-12-02 | Discharge: 2021-12-02 | Disposition: A | Discharge status: Home / Self Care | Payer: Medicare PPO | Source: Ambulatory Visit | Attending: Cardiology | Admitting: Cardiology

## 2021-12-02 DIAGNOSIS — R55 Syncope and collapse: Secondary | ICD-10-CM | POA: Insufficient documentation

## 2021-12-03 ENCOUNTER — Ambulatory Visit: Payer: Medicare PPO | Admitting: Neurology

## 2021-12-03 DIAGNOSIS — R0683 Snoring: Secondary | ICD-10-CM

## 2021-12-03 DIAGNOSIS — G478 Other sleep disorders: Secondary | ICD-10-CM

## 2021-12-03 DIAGNOSIS — R001 Bradycardia, unspecified: Secondary | ICD-10-CM

## 2021-12-03 DIAGNOSIS — T733XXD Exhaustion due to excessive exertion, subsequent encounter: Secondary | ICD-10-CM

## 2021-12-03 DIAGNOSIS — R4020 Unspecified coma: Secondary | ICD-10-CM | POA: Diagnosis not present

## 2021-12-05 ENCOUNTER — Ambulatory Visit: Payer: Medicare PPO | Attending: Nurse Practitioner

## 2021-12-05 DIAGNOSIS — R55 Syncope and collapse: Secondary | ICD-10-CM

## 2021-12-08 ENCOUNTER — Ambulatory Visit (HOSPITAL_COMMUNITY): Payer: Medicare PPO | Attending: Cardiology

## 2021-12-08 DIAGNOSIS — R55 Syncope and collapse: Secondary | ICD-10-CM | POA: Diagnosis not present

## 2021-12-08 LAB — ECHOCARDIOGRAM COMPLETE
Area-P 1/2: 3.72 cm2
MV M vel: 5.52 m/s
MV Peak grad: 121.9 mmHg
P 1/2 time: 436 msec
Radius: 0.7 cm
S' Lateral: 2.4 cm

## 2021-12-08 NOTE — Procedures (Signed)
    History:  76 year old woman with loss of consciousness   EEG classification: Awake and drowsy  Description of the recording: The background rhythms of this recording consists of a fairly well modulated medium amplitude alpha rhythm of 9-10 Hz that is reactive to eye opening and closure. As the record progresses, the patient appears to remain in the waking state throughout the recording. Photic stimulation was performed, did not show any abnormalities. Hyperventilation was also performed, did not show any abnormalities. Toward the end of the recording, the patient enters the drowsy state with slight symmetric slowing seen. The patient never enters stage II sleep. No abnormal epileptiform discharges seen during this recording. There was no focal slowing. EKG monitor shows evidence of cardiac rhythm abnormalities (PACs) with a heart rate of 60.  Abnormality: None   Impression: This is a normal EEG recording in the waking and drowsy state. No evidence of interictal epileptiform discharges seen. A normal EEG does not exclude a diagnosis of epilepsy.    Alric Ran, MD [facility]

## 2021-12-10 ENCOUNTER — Encounter: Payer: Self-pay | Admitting: *Deleted

## 2021-12-11 ENCOUNTER — Telehealth: Payer: Self-pay

## 2021-12-11 NOTE — Telephone Encounter (Signed)
Spoke with pt. Pt was notified of echo & vas u/s results. Pt will continue her current medications and f/u as planned.

## 2021-12-15 ENCOUNTER — Encounter: Payer: Self-pay | Admitting: Cardiology

## 2021-12-16 ENCOUNTER — Telehealth: Payer: Self-pay | Admitting: Neurology

## 2021-12-16 NOTE — Telephone Encounter (Signed)
HST- [employer] no auth req spoke to [place] P ref # [id number].  Patient is scheduled at [facility] for 01/19/22 at 11 AM.  Mailed packet to the patient.

## 2021-12-17 ENCOUNTER — Ambulatory Visit: Payer: Medicare PPO | Admitting: Family Medicine

## 2021-12-17 ENCOUNTER — Encounter: Payer: Self-pay | Admitting: Family Medicine

## 2021-12-17 VITALS — BP 109/75 | HR 63 | Temp 97.7°F | Wt 130.0 lb

## 2021-12-17 DIAGNOSIS — Z79890 Hormone replacement therapy (postmenopausal): Secondary | ICD-10-CM | POA: Diagnosis not present

## 2021-12-17 DIAGNOSIS — E034 Atrophy of thyroid (acquired): Secondary | ICD-10-CM | POA: Diagnosis not present

## 2021-12-17 DIAGNOSIS — I700 Atherosclerosis of aorta: Secondary | ICD-10-CM | POA: Diagnosis not present

## 2021-12-17 DIAGNOSIS — J479 Bronchiectasis, uncomplicated: Secondary | ICD-10-CM | POA: Diagnosis not present

## 2021-12-17 DIAGNOSIS — I2510 Atherosclerotic heart disease of native coronary artery without angina pectoris: Secondary | ICD-10-CM

## 2021-12-17 DIAGNOSIS — G479 Sleep disorder, unspecified: Secondary | ICD-10-CM | POA: Insufficient documentation

## 2021-12-17 DIAGNOSIS — E782 Mixed hyperlipidemia: Secondary | ICD-10-CM | POA: Diagnosis not present

## 2021-12-17 DIAGNOSIS — I4729 Other ventricular tachycardia: Secondary | ICD-10-CM | POA: Diagnosis not present

## 2021-12-17 DIAGNOSIS — I10 Essential (primary) hypertension: Secondary | ICD-10-CM | POA: Diagnosis not present

## 2021-12-17 MED ORDER — AMLODIPINE BESYLATE 5 MG PO TABS: 5.0000 mg | ORAL_TABLET | Freq: Every day | ORAL | 2 refills | Status: DC

## 2021-12-17 MED ORDER — ATORVASTATIN CALCIUM 40 MG PO TABS
40.0000 mg | ORAL_TABLET | Freq: Every day | ORAL | 3 refills | Status: DC
Start: 2021-12-17 — End: 2022-07-06

## 2021-12-17 MED ORDER — TRAZODONE HCL 50 MG PO TABS: 25.0000 mg | ORAL_TABLET | Freq: Every day | ORAL | 1 refills | Status: DC

## 2021-12-17 MED ORDER — LOSARTAN POTASSIUM 25 MG PO TABS: 12.5000 mg | ORAL_TABLET | Freq: Every day | ORAL | 2 refills | Status: DC

## 2021-12-17 NOTE — Progress Notes (Signed)
   Patient ID: Yvette Short, female  DOB: 01/03/1946, 76 y.o.   MRN: [id number] Patient Care Team    Relationship Specialty Notifications Start End  Kuneff, Renee A, DO PCP - General Family Medicine  08/03/17   Tobb, Kardie, DO PCP - Cardiology Cardiology  11/25/21   Grewal, Michelle, MD Consulting Physician Obstetrics and Gynecology  08/03/17   Wert, Michael B, MD Consulting Physician Pulmonary Disease  08/03/17   Smith, Zachary M, DO Consulting Physician Sports Medicine  08/03/17   Harris, Sean M, MD Referring Physician Gastroenterology  08/03/17   Miller, Sally, OD  Optometry  08/03/17   Tobb, Kardie, DO Consulting Physician Cardiology  06/30/21     Chief Complaint  Patient presents with   Hypertension    Pt is not fasting     Subjective:  Yvette Short is a 76 y.o.  Female  present for [facility] All past medical history, surgical history, allergies, family history, immunizations, medications and social history were updated in the electronic medical record today. All recent labs, ED visits and hospitalizations within the last year were reviewed.   HTN/Mixed hyperlipidemia/aortic atherosclerosis/AR/MR/CAD Pt reports plans with losartan12.5 mg qd and amlodipine 5 mg qd. Patient denies chest pain, shortness of breath, dizziness or lower extremity edema.  Established with cardio, which prescribed acebutolol. RF: [id number] CAD Result Date: 09/19/2020-echocardiogram  IMPRESSIONS   1. Left ventricular ejection fraction, by estimation, is 60 to 65%. The left ventricle has normal function. The left ventricle has no regional wall motion abnormalities. Left ventricular diastolic function could not be evaluated.   2. Right ventricular systolic function is normal. The right ventricular size is normal. There is moderately elevated pulmonary artery systolic pressure.   3. Vena Contracta -0.29, ERO 0.06 cm2, MR Volume 11 ml. The mitral valve is normal in structure. Mild mitral valve regurgitation.  No evidence of mitral stenosis.   4. The aortic valve is normal in structure. Aortic valve regurgitation is mild. No aortic stenosis is present.   5. The inferior vena cava is normal in size with greater than 50% respiratory variability, suggesting right atrial pressure of 3 mmHg.    Hypothyroidism, unspecified type She reports compliance with levo 88 mcg daily.  Memory changes/syncopal episode: She has not seen cardiology and neurology for her syncopal episode (please see prior note for full details). Neurology performed an EEG which was reported as normal.  But did suggest she is not falling into a deep sleep pattern. Cardiology has her wearing a 30-day monitor currently.     06/02/2021    1:16 PM 01/07/2021    8:38 AM 06/26/2020    8:48 AM 05/07/2020    9:10 AM 12/25/2019    8:46 AM  Depression screen PHQ 2/9  Decreased Interest 0 0 0 0 1  Down, Depressed, Hopeless 0 0 1 0 0  PHQ - 2 Score 0 0 1 0 1  Altered sleeping  0 1  0  Tired, decreased energy  0 1  2  Change in appetite  0 2  1  Feeling bad or failure about yourself   0 1  0  Trouble concentrating  0 0  0  Moving slowly or fidgety/restless  0 0  0  Suicidal thoughts  0 0  0  PHQ-9 Score  0 6  4      06/26/2020    8:48 AM 12/25/2019    8:46 AM  GAD 7 : Generalized Anxiety Score  Nervous,   Anxious, on Edge 1 0  Control/stop worrying 0 0  Worry too much - different things 0 0  Trouble relaxing 0 0  Restless 0 0  Easily annoyed or irritable 1 1  Afraid - awful might happen 0 0  Total GAD 7 Score 2 1    Immunization History  Administered Date(s) Administered   Fluad Quad(high Dose 65+) 12/25/2019, 11/30/2021   Influenza Split 12/21/2010, 12/21/2011   Influenza Whole 01/04/2008, 12/31/2008, 01/14/2010   Influenza, High Dose Seasonal PF 01/03/2013, 12/26/2013, 02/12/2016, 12/28/2016, 12/05/2020   Influenza-Unspecified 12/06/2017   PFIZER(Purple Top)SARS-COV-2 Vaccination 04/22/2019, 05/14/2019, 01/25/2020   Pfizer  Covid-19 Vaccine Bivalent Booster 12yrs & up 12/05/2020   Pneumococcal Conjugate-13 02/16/2016   Pneumococcal Polysaccharide-23 04/10/2012   Td 03/09/2001, 12/10/2008   Tdap 12/10/2008, 12/28/2016   Zoster Recombinat (Shingrix) 08/03/2017, 12/29/2017, 12/29/2017   Zoster, Live 12/21/2010     Past Medical History:  Diagnosis Date   Abnormal colonoscopy 03/2017   decreased rectal tone and polyp; 5 year recall   Abnormal findings on esophagogastroduodenoscopy (EGD) 02/25/2016   Z-line irrg. at 40 cm. No abnomrality of the esophagus to explain dysphagia. Esophagus dilated. H/o + H. Pylori   Allergy    ANXIETY 07/06/2007   Aortic atherosclerosis (HCC) 02/08/2018   Aortic valve regurgitation    Atherosclerosis of native coronary artery of native heart without angina pectoris 02/08/2018   Biceps tendonitis on right    Bronchiectasis without complication (HCC) 03/16/2011   CT 2012 IMPRESSION:  1. Apical scarring may account for the plain film abnormality.  2. A 6 mm right upper lobe pulmonary nodule.  - - HRCT 07/12/2017 >>>  Scattered minimal cylindrical and varicoid bronchiectasis with associated scattered mucoid impaction and minimal tree-in-bud opacity in both lungs, predominantly in the mid to lower lungs. Findings are stable to slightly worsened since 2015 ches   Chicken pox    Chronic cough 05/18/2017   CT chest 03/19/14  No bronchiectasis (not hrct)  Spirometry 05/18/2017  FEV1  2.15 (93%)  Ratio 76 s rx prior  - FENO 05/18/2017  =   Could not perform   - Allergy profile 05/18/2017 >  Eos 0.2 /  IgE  75 RAST pos cat > dog > mold - HRCT 07/12/2017 >>>  See bronchiectasis - trial of dymista 07/26/2017 > some better but not consistent with it > rechallenge rx one puff each am 10/25/2017  - MCT 11/02/17 >     Colon polyp 03/31/2017   hyperplastic   COVID-19 08/04/2020   mild   Diverticulosis 03/2017   sigmoid   Family history of colon cancer 04/03/2017   Formatting of this note might be different  from the original. 1/19 colon - no adenomas Recommend screening exam in 1/24 - [facility]   Family history of ovarian cancer    Genetic testing 03/26/2016   Negative genetic testing on the [facility] 30 gene panel.  The 30-Gene Cancer Panel offered by [facility] includes sequencing and/or deletion duplication testing of the following 30 genes: APC, ATM, BAP1, BARD1, BMPR1A, BRCA1, BRCA2, BRIP1, CDH1, CDK4, CDKN2A (p14ARF), CDKN2A (p16INK4a), CHEK2, EPCAM, GREM1, MITF, MLH1, MSH2, MSH6, MUTYH, NBN, PALB2, PMS2, POLD1, POLE, PTEN, RAD51C, RAD51D, SMAD4,    GERD 11/09/2006   H. pylori infection    h/o treated wiht prevpac   Hormone replacement therapy 01/08/2019   HYPERLIPIDEMIA 11/09/2006   Hypertension    Hypothyroidism 08/03/2017   Lumbar radiculopathy 07/24/2014   Migraine    Mild depression 02/14/2019     Pharyngoesophageal dysphagia 08/03/2017   Piriformis syndrome of right side    Spinal stenosis, lumbar region, with neurogenic claudication 10/16/2014   Epidural 10/02/2014, December 2016    Stomach ulcer 2008   Allergies  Allergen Reactions   Tetracycline Hcl Rash        Past Surgical History:  Procedure Laterality Date   CATARACT EXTRACTION Bilateral 04/2021   CERVICAL POLYPECTOMY  01/17/2019   LIPOMA EXCISION     of back   TUBAL LIGATION     Family History  Problem Relation Age of Onset   Dementia Mother    Hyperlipidemia Mother    Colon cancer Father 58   Colon polyps Brother        gets colonoscopy every 2-3 years   Hyperlipidemia Brother    Hypertension Brother    Uterine cancer Maternal Aunt        dx in her 50s   Colon cancer Maternal Uncle    Colon cancer Paternal Aunt    Heart attack Maternal Grandfather    Cancer Other        MGMs sister - "abdominal cancer"   Breast cancer Cousin    Social History   Social History Narrative   Married. Two children.    College grad.    Former smoker.    Exercises routinely.    Drink caffeine.    Wears a hearing  aid.    Smoke alarm in the home.    Wears her seatbelt.    Feels safe in her relationships.     Allergies as of 12/17/2021       Reactions   Tetracycline Hcl Rash            Medication List        Accurate as of December 17, 2021  8:57 AM. If you have any questions, ask your nurse or doctor.          acebutolol 200 MG capsule Commonly known as: SECTRAL TAKE 1 CAPSULE BY MOUTH TWICE A DAY What changed:  how much to take how to take this when to take this   amLODipine 5 MG tablet Commonly known as: NORVASC Take 1 tablet (5 mg total) by mouth daily.   aspirin EC 81 MG tablet Take 81 mg by mouth daily. Swallow whole.   atorvastatin 40 MG tablet Commonly known as: LIPITOR Take 1 tablet (40 mg total) by mouth daily.   b complex vitamins capsule Take 1 capsule by mouth daily.   CALCIUM & VIT D3 BONE HEALTH PO Take by mouth.   chlorpheniramine 4 MG tablet Commonly known as: CHLOR-TRIMETON Take 4 mg by mouth every 4 (four) hours as needed for allergies.   DIALYVITE VITAMIN D 5000 PO Take by mouth.   estradiol 0.0375 MG/24HR Commonly known as: VIVELLE-DOT Place 1 patch onto the skin 2 (two) times a week.   gabapentin 100 MG capsule Commonly known as: NEURONTIN TAKE 2-3 CAPSULES (200-300 MG TOTAL) BY MOUTH AT BEDTIME.   gabapentin 400 MG capsule Commonly known as: NEURONTIN Take 400 mg by mouth 4 (four) times daily.   levothyroxine 88 MCG tablet Commonly known as: SYNTHROID Take 1 tablet (88 mcg total) by mouth daily.   losartan 25 MG tablet Commonly known as: COZAAR Take 0.5 tablets (12.5 mg total) by mouth daily.   MAGNESIUM PO Take by mouth.   progesterone 100 MG capsule Commonly known as: PROMETRIUM Take by mouth.   traZODone 50 MG tablet Commonly known as: DESYREL Take   0.5 tablets (25 mg total) by mouth at bedtime. Started by: Renee Kuneff, DO   VITAMIN K PO Take by mouth.        All past medical history, surgical history,  allergies, family history, immunizations andmedications were updated in the EMR today and reviewed under the history and medication portions of their EMR.       ROS 14 pt review of systems performed and negative (unless mentioned in an HPI)  Objective: BP 109/75   Pulse 63   Temp 97.7 F (36.5 C)   Wt 130 lb (59 kg)   SpO2 100%   BMI 21.97 kg/m  Physical Exam Vitals and nursing note reviewed.  Constitutional:      General: She is not in acute distress.    Appearance: Normal appearance. She is not ill-appearing, toxic-appearing or diaphoretic.  HENT:     Head: Normocephalic and atraumatic.     Mouth/Throat:     Mouth: Mucous membranes are moist.  Eyes:     General: No scleral icterus.       Right eye: No discharge.        Left eye: No discharge.     Extraocular Movements: Extraocular movements intact.     Conjunctiva/sclera: Conjunctivae normal.     Pupils: Pupils are equal, round, and reactive to light.  Cardiovascular:     Rate and Rhythm: Normal rate and regular rhythm.     Heart sounds: No murmur heard. Pulmonary:     Effort: Pulmonary effort is normal. No respiratory distress.     Breath sounds: Normal breath sounds. No wheezing, rhonchi or rales.  Musculoskeletal:     Cervical back: Neck supple. No tenderness.     Right lower leg: No edema.     Left lower leg: No edema.  Lymphadenopathy:     Cervical: No cervical adenopathy.  Skin:    General: Skin is warm and dry.     Coloration: Skin is not jaundiced or pale.     Findings: No erythema or rash.  Neurological:     Mental Status: She is alert and oriented to person, place, and time. Mental status is at baseline.     Motor: No weakness.     Gait: Gait normal.  Psychiatric:        Mood and Affect: Mood normal.        Behavior: Behavior normal.        Thought Content: Thought content normal.        Judgment: Judgment normal.      No results found.  Assessment/plan: Yvette Short is a 76 y.o.  female present for CPE/CMC Chronic cough/Pharyngoesophageal dysphagia/Bronchiectasis without complication (HCC)/GERD - chronic cough started ~2016, on high dose omeprazole. Last EGD 2017, Dr. Harris - pulmonology (Dr. Wert) did not feel it was caused by lung condition. Ct abnormal w/ tree in bud.  - [facility] and Dr. Mann office did not accept her bc they did not feel they could offer her more. -  Encouraged her to contiue to follow with Dr. Harris and if needed could try to refer to [facility] for another opinion or [facility]>>> now on carafate and seems to be helping.  Encouraged her to restart the PPI as well as continue the Carafate.  Refilled these today. - tried albuterol before exercise>> patient reported it was not helpful. - F/U PRN  HTN/Mixed hyperlipidemia/aortic atherosclerosis/AR/MR/CAD [place] Continue amlodipine 5 mg daily Continue losartan 12.5 mg daily Continue sectral, lipitor prescribed by   cardiology.  Continue baby asa F/u 5.5 mos cmc  Hypothyroidism due to acquired atrophy of thyroid Stable Continue levothyroxine 88 mcg daily   Sleep disorder: We discussed sleep disorders today and that if she is not falling into a deep sleep pattern for an adequate amount of time her body needs nightly, then this can also altered her memory and cognitive function. We discussed trying low-dose trazodone to see if we can get her more restorative sleep.  Patient is agreeable.  I did advise her to inform her neurology team we started this, they may want her not to take it on the night that she has her full sleep study. Trazodone 25-50 mg nightly Follow-up in 5.5 months, sooner if needed  Return in about 7 months (around 07/05/2022) for cpe (20 min), Routine chronic condition follow-up.   No orders of the defined types were placed in this encounter.  Meds ordered this encounter  Medications   atorvastatin (LIPITOR) 40 MG tablet    Sig: Take 1 tablet (40 mg total) by mouth daily.     Dispense:  90 tablet    Refill:  3   amLODipine (NORVASC) 5 MG tablet    Sig: Take 1 tablet (5 mg total) by mouth daily.    Dispense:  90 tablet    Refill:  2   losartan (COZAAR) 25 MG tablet    Sig: Take 0.5 tablets (12.5 mg total) by mouth daily.    Dispense:  45 tablet    Refill:  2   traZODone (DESYREL) 50 MG tablet    Sig: Take 0.5 tablets (25 mg total) by mouth at bedtime.    Dispense:  45 tablet    Refill:  1   Referral Orders  No referral(s) requested today     Electronically signed by: Howard Pouch, [place]

## 2021-12-17 NOTE — Patient Instructions (Addendum)
Return in about 7 months (around 07/05/2022) for cpe (20 min), Routine chronic condition follow-up.        Great to see you today.  I have refilled the medication(s) we provide.   If labs were collected, we will inform you of lab results once received either by echart message or telephone call.   - echart message- for normal results that have been seen by the patient already.   - telephone call: abnormal results or if patient has not viewed results in their echart.

## 2022-01-04 ENCOUNTER — Ambulatory Visit: Payer: Medicare PPO | Attending: Cardiology | Admitting: Cardiology

## 2022-01-04 VITALS — BP 118/68 | HR 66 | Ht 65.0 in | Wt 129.6 lb

## 2022-01-04 DIAGNOSIS — I4729 Other ventricular tachycardia: Secondary | ICD-10-CM | POA: Diagnosis not present

## 2022-01-04 DIAGNOSIS — I351 Nonrheumatic aortic (valve) insufficiency: Secondary | ICD-10-CM | POA: Diagnosis not present

## 2022-01-04 DIAGNOSIS — I491 Atrial premature depolarization: Secondary | ICD-10-CM

## 2022-01-04 DIAGNOSIS — E782 Mixed hyperlipidemia: Secondary | ICD-10-CM

## 2022-01-04 DIAGNOSIS — I10 Essential (primary) hypertension: Secondary | ICD-10-CM | POA: Diagnosis not present

## 2022-01-04 NOTE — Patient Instructions (Signed)
Medication Instructions:  Your physician recommends that you continue on your current medications as directed. Please refer to the Current Medication list given to you today.  *If you need a refill on your cardiac medications before your next appointment, please call your pharmacy*  Lab Work: NONE ordered at this time of appointment   If you have labs (blood work) drawn today and your tests are completely normal, you will receive your results only by: [place] (if you have MyChart) OR A paper copy in the mail If you have any lab test that is abnormal or we need to change your treatment, we will call you to review the results.  Testing/Procedures: NONE ordered at this time of appointment   Follow-Up: At [facility], you and your health needs are our priority.  As part of our continuing mission to provide you with exceptional heart care, we have created designated Provider Care Teams.  These Care Teams include your primary Cardiologist (physician) and Advanced Practice Providers (APPs -  Physician Assistants and Nurse Practitioners) who all work together to provide you with the care you need, when you need it.  Your next appointment:   6 month(s)  The format for your next appointment:   In Person  Provider:   Berniece Salines, DO     Other Instructions   Important Information About Sugar

## 2022-01-04 NOTE — Progress Notes (Signed)
Cardiology Office Note:    Date:  01/07/2022   ID:  Yvette Short, DOB 27-Dec-1945, MRN [id number]  PCP:  Ma Hillock, DO  Cardiologist:  Berniece Salines, DO  Electrophysiologist:  None   Referring MD: Ma Hillock, DO   Chief Complaint  Patient presents with   Follow-up   History of Present Illness:    Yvette Short is a 76 y.o. female with a hx of hx of  hypertension, hyperlipidemia, hypothyroidism, moderate coronary artery disease by coronary CTA, frequent PACs and PVCs, moderate mitral regurgitation, moderate aortic regurgitation presents today for follow-up visit.   I  saw the patient on September 04, 2019 at that time we discussed her monitor results which showed frequent PACs as well as PVCs.  I did start the patient on acebutolol 200 mg twice daily. That day she also reported that she was experiencing chest pain we ordered coronary CTA which she was able to get in the interim showed moderate coronary artery disease.   I did see the patient in July 2021 at that time she was reporting significant fatigue and her blood pressure was running between 90 systolic to 456 systolic.  I did not stop her amlodipine.  In addition giving her CTA showing coronary artery disease I increased her Lipitor to 40 mg daily.   I saw the patient in October 2021 at that time we will decrease her acebutolol to 200 mg daily.     I saw the patient on June 03, 2020 at that time she appeared to be orthostatic positive.  Encouraged the patient reduce abdominal binder to help with this.  She also did have some dizziness I placed a monitor on the patient, and intermittent her monitor that showed that she had improvement with her PACs.     I saw the patient on September 08, 2020 at that time she was stable from a CV standpoint.  We repeated her echocardiogram to assess her moderate mitral regurgitation.  I saw the patient on 12/03/2021, at that time she was doing well from a CV standpoint. Since her visit she  has been seen in the office my Florence Canner, NP due to a related synocpe episode.a monitor was placed on the patient and her acebutolol was not changed.   No other complaints at this time.  Past Medical History:  Diagnosis Date   Abnormal colonoscopy 03/2017   decreased rectal tone and polyp; 5 year recall   Abnormal findings on esophagogastroduodenoscopy (EGD) 02/25/2016   Z-line irrg. at 40 cm. No abnomrality of the esophagus to explain dysphagia. Esophagus dilated. H/o + H. Pylori   Allergy    ANXIETY 07/06/2007   Aortic atherosclerosis ([place]) 02/08/2018   Aortic valve regurgitation    Atherosclerosis of native coronary artery of native heart without angina pectoris 02/08/2018   Biceps tendonitis on right    Bronchiectasis without complication ([place]) 25/63/8937   CT 2012 IMPRESSION:  1. Apical scarring may account for the plain film abnormality.  2. A 6 mm right upper lobe pulmonary nodule.  - - HRCT 07/12/2017 >>>  Scattered minimal cylindrical and varicoid bronchiectasis with associated scattered mucoid impaction and minimal tree-in-bud opacity in both lungs, predominantly in the mid to lower lungs. Findings are stable to slightly worsened since 2015 ches   Chicken pox    Chronic cough 05/18/2017   CT chest 03/19/14  No bronchiectasis (not hrct)  Spirometry 05/18/2017  FEV1  2.15 (93%)  Ratio 76 s rx prior  -  FENO 05/18/2017  =   Could not perform   - Allergy profile 05/18/2017 >  Eos 0.2 /  IgE  75 RAST pos cat > dog > mold - HRCT 07/12/2017 >>>  See bronchiectasis - trial of dymista 07/26/2017 > some better but not consistent with it > rechallenge rx one puff each am 10/25/2017  - MCT 11/02/17 >     Colon polyp 03/31/2017   hyperplastic   COVID-19 08/04/2020   mild   Diverticulosis 03/2017   sigmoid   Family history of colon cancer 04/03/2017   Formatting of this note might be different from the original. 1/19 colon - no adenomas Recommend screening exam in 1/24 - [facility]   Family history of  ovarian cancer    Genetic testing 03/26/2016   Negative genetic testing on the [facility] 30 gene panel.  The 30-Gene Cancer Panel offered by [facility] includes sequencing and/or deletion duplication testing of the following 30 genes: APC, ATM, BAP1, BARD1, BMPR1A, BRCA1, BRCA2, BRIP1, CDH1, CDK4, CDKN2A (p14ARF), CDKN2A (p16INK4a), CHEK2, EPCAM, GREM1, MITF, MLH1, MSH2, MSH6, MUTYH, NBN, PALB2, PMS2, POLD1, POLE, PTEN, RAD51C, RAD51D, SMAD4,    GERD 11/09/2006   H. pylori infection    h/o treated wiht prevpac   Hormone replacement therapy 01/08/2019   HYPERLIPIDEMIA 11/09/2006   Hypertension    Hypothyroidism 08/03/2017   Lumbar radiculopathy 07/24/2014   Migraine    Mild depression 02/14/2019   Pharyngoesophageal dysphagia 08/03/2017   Piriformis syndrome of right side    Spinal stenosis, lumbar region, with neurogenic claudication 10/16/2014   Epidural 10/02/2014, December 2016    Stomach ulcer 2008    Past Surgical History:  Procedure Laterality Date   CATARACT EXTRACTION Bilateral 04/2021   CERVICAL POLYPECTOMY  01/17/2019   LIPOMA EXCISION     of back   TUBAL LIGATION      Current Medications: Current Meds  Medication Sig   acebutolol (SECTRAL) 200 MG capsule Take 200 mg by mouth daily.   amLODipine (NORVASC) 5 MG tablet Take 1 tablet (5 mg total) by mouth daily.   aspirin EC 81 MG tablet Take 81 mg by mouth daily. Swallow whole.   atorvastatin (LIPITOR) 40 MG tablet Take 1 tablet (40 mg total) by mouth daily.   b complex vitamins capsule Take 1 capsule by mouth daily.   chlorpheniramine (CHLOR-TRIMETON) 4 MG tablet Take 4 mg by mouth every 4 (four) hours as needed for allergies.   Cholecalciferol (DIALYVITE VITAMIN D 5000 PO) Take by mouth.   estradiol (VIVELLE-DOT) 0.0375 MG/24HR Place 1 patch onto the skin 2 (two) times a week.   gabapentin (NEURONTIN) 400 MG capsule Take 400 mg by mouth 3 (three) times daily.   levothyroxine (SYNTHROID) 88 MCG tablet Take 1 tablet  (88 mcg total) by mouth daily.   losartan (COZAAR) 25 MG tablet Take 0.5 tablets (12.5 mg total) by mouth daily.   MAGNESIUM PO Take by mouth.   Multiple Minerals-Vitamins (CALCIUM & VIT D3 BONE HEALTH PO) Take by mouth.   progesterone (PROMETRIUM) 100 MG capsule Take 100 mg by mouth in the morning and at bedtime.   traZODone (DESYREL) 50 MG tablet Take 25 mg by mouth at bedtime as needed for sleep. Take half tablet by mouth as needed at bedtime   VITAMIN K PO Take by mouth.     Allergies:   Tetracycline hcl   Social History   Socioeconomic History   Marital status: Married    Spouse name: Not on  file   Number of children: Not on file   Years of education: Not on file   Highest education level: Not on file  Occupational History   Not on file  Tobacco Use   Smoking status: Former    Packs/day: 2.50    Years: 13.00    Total pack years: 32.50    Types: Cigarettes    Quit date: 07/28/1974    Years since quitting: 47.4   Smokeless tobacco: Never  Vaping Use   Vaping Use: Never used  Substance and Sexual Activity   Alcohol use: Yes    Alcohol/week: 2.0 standard drinks of alcohol    Types: 2 Cans of beer per week   Drug use: No   Sexual activity: Yes    Partners: Male  Other Topics Concern   Not on file  Social History Narrative   Married. Two children.    College grad.    Former smoker.    Exercises routinely.    Drink caffeine.    Wears a hearing aid.    Smoke alarm in the home.    Wears her seatbelt.    Feels safe in her relationships.    Social Determinants of Health   Financial Resource Strain: Low Risk  (06/02/2021)   Overall Financial Resource Strain (CARDIA)    Difficulty of Paying Living Expenses: Not hard at all  Food Insecurity: No Food Insecurity (06/02/2021)   Hunger Vital Sign    Worried About Running Out of Food in the Last Year: Never true    Ran Out of Food in the Last Year: Never true  Transportation Needs: No Transportation Needs (06/02/2021)    PRAPARE - Hydrologist (Medical): No    Lack of Transportation (Non-Medical): No  Physical Activity: Sufficiently Active (06/02/2021)   Exercise Vital Sign    Days of Exercise per Week: 3 days    Minutes of Exercise per Session: 60 min  Stress: No Stress Concern Present (06/02/2021)   [employer]    Feeling of Stress : Not at all  Social Connections: Moderately Isolated (06/02/2021)   Social Connection and Isolation Panel [NHANES]    Frequency of Communication with Friends and Family: Once a week    Frequency of Social Gatherings with Friends and Family: Once a week    Attends Religious Services: More than 4 times per year    Active Member of [employer] or Organizations: No    Attends Archivist Meetings: Never    Marital Status: Married     Family History: The patient's family history includes Breast cancer in her cousin; Cancer in an other family member; Colon cancer in her maternal uncle and paternal aunt; Colon cancer (age of onset: 19) in her father; Colon polyps in her brother; Dementia in her mother; Heart attack in her maternal grandfather; Hyperlipidemia in her brother and mother; Hypertension in her brother; Uterine cancer in her maternal aunt.  ROS:   Review of Systems  Constitution: Negative for decreased appetite, fever and weight gain.  HENT: Negative for congestion, ear discharge, hoarse voice and sore throat.   Eyes: Negative for discharge, redness, vision loss in right eye and visual halos.  Cardiovascular: Negative for chest pain, dyspnea on exertion, leg swelling, orthopnea and palpitations.  Respiratory: Negative for cough, hemoptysis, shortness of breath and snoring.   Endocrine: Negative for heat intolerance and polyphagia.  Hematologic/Lymphatic: Negative for bleeding problem. Does not bruise/bleed  easily.  Skin: Negative for flushing, nail changes, rash and suspicious  lesions.  Musculoskeletal: Negative for arthritis, joint pain, muscle cramps, myalgias, neck pain and stiffness.  Gastrointestinal: Negative for abdominal pain, bowel incontinence, diarrhea and excessive appetite.  Genitourinary: Negative for decreased libido, genital sores and incomplete emptying.  Neurological: Negative for brief paralysis, focal weakness, headaches and loss of balance.  Psychiatric/Behavioral: Negative for altered mental status, depression and suicidal ideas.  Allergic/Immunologic: Negative for HIV exposure and persistent infections.    EKGs/Labs/Other Studies Reviewed:    The following studies were reviewed today:   EKG:  None today   TTE 12/08/2021 IMPRESSIONS     1. Left ventricular ejection fraction, by estimation, is 60 to 65%. The  left ventricle has normal function. The left ventricle has no regional  wall motion abnormalities. Left ventricular diastolic parameters are  consistent with Grade II diastolic  dysfunction (pseudonormalization).   2. Right ventricular systolic function is normal. The right ventricular  size is normal. There is normal pulmonary artery systolic pressure. The  estimated right ventricular systolic pressure is 77.4 mmHg.   3. PISA 0.7cm. The mitral valve is myxomatous. Moderate mitral valve  regurgitation. No evidence of mitral stenosis.   4. The aortic valve is tricuspid. Aortic valve regurgitation is mild. No  aortic stenosis is present.   5. The inferior vena cava is normal in size with greater than 50%  respiratory variability, suggesting right atrial pressure of 3 mmHg.   Comparison(s): 09/19/20 EF 60-65%. Mild AI. Mild MR.   Recent Labs: 06/30/2021: Hemoglobin 13.6; Platelets 239.0 11/17/2021: ALT 22; BUN 16; Creatinine, Ser 1.05; Potassium 4.2; Sodium 138; TSH 2.46 11/26/2021: Magnesium 2.3  Recent Lipid Panel    Component Value Date/Time   CHOL 143 06/30/2021 0844   TRIG 74.0 06/30/2021 0844   HDL 68.00 06/30/2021 0844    CHOLHDL 2 06/30/2021 0844   VLDL 14.8 06/30/2021 0844   LDLCALC 61 06/30/2021 0844   LDLDIRECT 158.2 05/07/2013 0928    Physical Exam:    VS:  BP 118/68   Pulse 66   Ht _0  (1.651 m)   Wt 129 lb 9.6 oz (58.8 kg)   SpO2 100%   BMI 21.57 kg/m     Wt Readings from Last 3 Encounters:  01/04/22 129 lb 9.6 oz (58.8 kg)  12/17/21 130 lb (59 kg)  11/26/21 128 lb (58.1 kg)     GEN: Well nourished, well developed in no acute distress HEENT: Normal NECK: No JVD; No carotid bruits LYMPHATICS: No lymphadenopathy CARDIAC: S1S2 noted,RRR, no murmurs, rubs, gallops RESPIRATORY:  Clear to auscultation without rales, wheezing or rhonchi  ABDOMEN: Soft, non-tender, non-distended, +bowel sounds, no guarding. EXTREMITIES: No edema, No cyanosis, no clubbing MUSCULOSKELETAL:  No deformity  SKIN: Warm and dry NEUROLOGIC:  Alert and oriented x 3, non-focal PSYCHIATRIC:  Normal affect, good insight  ASSESSMENT:    1. Non-sustained ventricular tachycardia (HCC)   2. Essential hypertension   3. Moderate aortic regurgitation   4. Frequent PAC (premature atrial contraction)   5. Mixed hyperlipidemia    PLAN:     No repeated syncope. We discussed her echo result MR now moderate. We discuss this, AR is mild.  Monitor pending. No medication changess. Fu monitor will make further recs if needed.  The patient is in agreement with the above plan. The patient left the office in stable condition.  The patient will follow up in   Medication Adjustments/Labs and Tests Ordered: Current medicines are  reviewed at length with the patient today.  Concerns regarding medicines are outlined above.  No orders of the defined types were placed in this encounter.  No orders of the defined types were placed in this encounter.   Patient Instructions  Medication Instructions:  Your physician recommends that you continue on your current medications as directed. Please refer to the Current Medication list  given to you today.  *If you need a refill on your cardiac medications before your next appointment, please call your pharmacy*  Lab Work: NONE ordered at this time of appointment   If you have labs (blood work) drawn today and your tests are completely normal, you will receive your results only by: [place] (if you have MyChart) OR A paper copy in the mail If you have any lab test that is abnormal or we need to change your treatment, we will call you to review the results.  Testing/Procedures: NONE ordered at this time of appointment   Follow-Up: At [facility], you and your health needs are our priority.  As part of our continuing mission to provide you with exceptional heart care, we have created designated Provider Care Teams.  These Care Teams include your primary Cardiologist (physician) and Advanced Practice Providers (APPs -  Physician Assistants and Nurse Practitioners) who all work together to provide you with the care you need, when you need it.  Your next appointment:   6 month(s)  The format for your next appointment:   In Person  Provider:   Berniece Salines, DO     Other Instructions   Important Information About Sugar         Adopting a Healthy Lifestyle.  Know what a healthy weight is for you (roughly BMI <25) and aim to maintain this   Aim for 7+ servings of fruits and vegetables daily   65-80+ fluid ounces of water or unsweet tea for healthy kidneys   Limit to max 1 drink of alcohol per day; avoid smoking/tobacco   Limit animal fats in diet for cholesterol and heart health - choose grass fed whenever available   Avoid highly processed foods, and foods high in saturated/trans fats   Aim for low stress - take time to unwind and care for your mental health   Aim for 150 min of moderate intensity exercise weekly for heart health, and weights twice weekly for bone health   Aim for 7-9 hours of sleep daily   When it comes to diets,  agreement about the perfect plan isnt easy to find, even among the experts. Experts at the [employer] developed an idea known as the Healthy Eating Plate. Just imagine a plate divided into logical, healthy portions.   The emphasis is on diet quality:   Load up on vegetables and fruits - one-half of your plate: Aim for color and variety, and remember that potatoes dont count.   Go for whole grains - one-quarter of your plate: Whole wheat, barley, wheat berries, quinoa, oats, brown rice, and foods made with them. If you want pasta, go with whole wheat pasta.   Protein power - one-quarter of your plate: Fish, chicken, beans, and nuts are all healthy, versatile protein sources. Limit red meat.   The diet, however, does go beyond the plate, offering a few other suggestions.   Use healthy plant oils, such as olive, canola, soy, corn, sunflower and peanut. Check the labels, and avoid partially hydrogenated oil, which have unhealthy trans fats.  If youre thirsty, drink water. Coffee and tea are good in moderation, but skip sugary drinks and limit milk and dairy products to one or two daily servings.   The type of carbohydrate in the diet is more important than the amount. Some sources of carbohydrates, such as vegetables, fruits, whole grains, and beans-are healthier than others.   Finally, stay active  Signed, Berniece Salines, DO  01/07/2022 5:06 PM    [facility]

## 2022-01-11 NOTE — Telephone Encounter (Signed)
Patient left a voicemail on my phone stating her primary care doctor put her on Trazodone but she is wondering if she should discontinue taking it until after she has her HST on 01/19/22.

## 2022-01-11 NOTE — Telephone Encounter (Signed)
Called pt. PCP gave her trazodone '50mg'$  to take 1/2 tab po qhs prn. Advised it would be fine for her to take this the night of HST. She verbalized understanding.

## 2022-01-19 ENCOUNTER — Ambulatory Visit (INDEPENDENT_AMBULATORY_CARE_PROVIDER_SITE_OTHER): Payer: Medicare PPO | Admitting: Neurology

## 2022-01-19 DIAGNOSIS — G4733 Obstructive sleep apnea (adult) (pediatric): Secondary | ICD-10-CM | POA: Diagnosis not present

## 2022-01-19 DIAGNOSIS — R4020 Unspecified coma: Secondary | ICD-10-CM

## 2022-01-19 DIAGNOSIS — R001 Bradycardia, unspecified: Secondary | ICD-10-CM

## 2022-01-19 DIAGNOSIS — G478 Other sleep disorders: Secondary | ICD-10-CM

## 2022-01-19 DIAGNOSIS — R0683 Snoring: Secondary | ICD-10-CM

## 2022-01-19 DIAGNOSIS — T733XXD Exhaustion due to excessive exertion, subsequent encounter: Secondary | ICD-10-CM
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
## 2022-01-26 DIAGNOSIS — R0683 Snoring: Secondary | ICD-10-CM | POA: Insufficient documentation

## 2022-01-26 DIAGNOSIS — R4020 Unspecified coma: Secondary | ICD-10-CM

## 2022-01-26 DIAGNOSIS — G478 Other sleep disorders: Secondary | ICD-10-CM | POA: Insufficient documentation

## 2022-01-26 DIAGNOSIS — T733XXA Exhaustion due to excessive exertion, initial encounter: Secondary | ICD-10-CM | POA: Insufficient documentation

## 2022-01-26 DIAGNOSIS — R001 Bradycardia, unspecified: Secondary | ICD-10-CM | POA: Insufficient documentation

## 2022-01-26 HISTORY — DX: Unspecified coma: R40.20
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
## 2022-01-26 NOTE — Addendum Note (Signed)
Addended by: Larey Seat on: 01/26/2022 06:40 PM   Modules accepted: Orders

## 2022-01-27 ENCOUNTER — Telehealth: Payer: Self-pay | Admitting: Neurology

## 2022-01-27 ENCOUNTER — Encounter: Payer: Self-pay | Admitting: Family Medicine

## 2022-01-27 DIAGNOSIS — G4733 Obstructive sleep apnea (adult) (pediatric): Secondary | ICD-10-CM | POA: Insufficient documentation

## 2022-01-27 NOTE — Telephone Encounter (Signed)
Called the patient and reviewed the sleep study results. Advised of the findings and that Dr Brett Fairy would recommend a CPAP titration study to be able to determine what pressure is needed. A total of 12 minutes was spent on the phone advising that this is the recommendation by Dr Dohmeier for the treatment of her sleep apnea. Pt will await to hear from the sleep lab on whether this is covered and if it isn't I advised I will be in touch with plan B. Pt verbalized understanding. Pt had no questions at this time but was encouraged to call back if questions arise.

## 2022-01-27 NOTE — Telephone Encounter (Signed)
-----   Message from Larey Seat, MD sent at 01/26/2022  6:40 PM EDT ----- IMPRESSION:  This HST confirms the presence of mild apnea which appears to be REM sleep dependent.  There were several sudden oxygen desaturation seen, very brief in duration and associated with REM sleep.  Unfortunately, we do not have data about sleep position or snoring.  RECOMMENDATION: I am certainly recommending  CPAP therapy for this patient, I would prefer an in lab titration to correlate apnea to position, snoring, EEG and cardiac rhythm. Bradycardia and sudden short drops in oxygen should be further evaluated.  Should insurance decline this plan I would follow-up with an auto titration CPAP device instead, setting between 5 and 12 cmH2O, 1 cm EPR, heated humidification to be adjusted by the patient and interface of choice for the comfort of this patient.

## 2022-01-29 ENCOUNTER — Encounter: Payer: Self-pay | Admitting: Cardiology

## 2022-02-01 ENCOUNTER — Telehealth: Payer: Self-pay | Admitting: Neurology

## 2022-02-01 NOTE — Telephone Encounter (Signed)
[employer] pending uploaded notes on the portal  

## 2022-02-10 ENCOUNTER — Telehealth: Payer: Self-pay

## 2022-02-10 DIAGNOSIS — L239 Allergic contact dermatitis, unspecified cause: Secondary | ICD-10-CM | POA: Diagnosis not present

## 2022-02-10 MED ORDER — APIXABAN 5 MG PO TABS: 5.0000 mg | ORAL_TABLET | Freq: Two times a day (BID) | ORAL | 3 refills | Status: DC

## 2022-02-10 NOTE — Telephone Encounter (Signed)
Spoke with pt. Pt was notified of results and recommendations. Eliquis 5 mg twice daily was sent to pts pharmacy ok per Diona Browner, NP

## 2022-03-08 NOTE — Telephone Encounter (Signed)
Called pt back at [phone]. Advised CB,RN spoke with her about HST results 01/27/22 and advised then that she would need CPAP titration. Pt didn't realize this was an in office study. Ok to proceed with scheduled. I transferred her to Raquel Sarna to get scheduled.

## 2022-03-08 NOTE — Telephone Encounter (Signed)
Spoke with the patient.  CPAP- she is scheduled for 05/17/22 at 8 pm.  Mailed packet to the patient.  The [employer] will expire before she has it. I will do a new auth once it gets closer.

## 2022-03-31 ENCOUNTER — Encounter: Payer: Self-pay | Admitting: Neurology

## 2022-03-31 ENCOUNTER — Ambulatory Visit: Payer: Medicare PPO | Admitting: Neurology

## 2022-03-31 VITALS — BP 149/77 | HR 76 | Ht 65.0 in | Wt 132.0 lb

## 2022-03-31 DIAGNOSIS — R0683 Snoring: Secondary | ICD-10-CM | POA: Diagnosis not present

## 2022-03-31 DIAGNOSIS — G479 Sleep disorder, unspecified: Secondary | ICD-10-CM

## 2022-03-31 DIAGNOSIS — R4789 Other speech disturbances: Secondary | ICD-10-CM

## 2022-03-31 DIAGNOSIS — I480 Paroxysmal atrial fibrillation: Secondary | ICD-10-CM
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
## 2022-03-31 NOTE — Patient Instructions (Signed)
I will order MRI of the brain  Keep appointment for CPAP titration  Will update once studies return  See you back in 6 months

## 2022-04-02 ENCOUNTER — Telehealth: Payer: Self-pay | Admitting: Neurology

## 2022-04-02 ENCOUNTER — Encounter: Payer: Self-pay | Admitting: Neurology

## 2022-04-02 DIAGNOSIS — H02403 Unspecified ptosis of bilateral eyelids: Secondary | ICD-10-CM | POA: Diagnosis not present

## 2022-04-02 DIAGNOSIS — Z961 Presence of intraocular lens: Secondary | ICD-10-CM | POA: Diagnosis not present

## 2022-04-02 NOTE — Telephone Encounter (Signed)
Mcarthur Rossetti Josem [facility]auf[facility]: [phone] exp. 04/02/22-05/02/22 sent to GI

## 2022-04-05 ENCOUNTER — Ambulatory Visit
Admission: RE | Admit: 2022-04-05 | Discharge: 2022-04-05 | Disposition: A | Discharge status: Home / Self Care | Payer: Medicare PPO | Source: Ambulatory Visit | Attending: Neurology | Admitting: Neurology

## 2022-04-05 DIAGNOSIS — I480 Paroxysmal atrial fibrillation: Secondary | ICD-10-CM | POA: Diagnosis not present

## 2022-04-05 MED ORDER — ALPRAZOLAM 0.5 MG PO TABS: 0.5000 mg | ORAL_TABLET | Freq: Every evening | ORAL | 0 refills | Status: DC | PRN

## 2022-04-08 ENCOUNTER — Telehealth: Payer: Self-pay | Admitting: Neurology

## 2022-04-08 DIAGNOSIS — R4789 Other speech disturbances: Secondary | ICD-10-CM

## 2022-04-08 NOTE — Telephone Encounter (Signed)
I called the patient.  Recent MRI was ordered due to the report of 1 year of word finding difficulty, recent diagnosis of A-fib.  Now on Eliquis.  MRI of the brain has shown chronic bilateral frontal corona radiata and right basal ganglia lacunar ischemic infarctions.  I will order CTA of the head to evaluate for intracranial stenosis, come by to check creatinine beforehand.  She already sees cardiology, carotid ultrasound September 2023 showed bilateral ICA 1-39% stenosis.  Echo 60 to 01%, grade 2 diastolic dysfunction.  Moderate mitral valve regurgitation.  LDL was 61 April 2023, A1c 5.7.  She is on Lipitor, losartan.  Scheduled for CPAP titration February 2024.  I will place a referral for speech therapy due to reported word finding trouble.  IMPRESSION:    MRI brain (without) demonstrating; - Chronic bilateral frontal corona radiata and right basal ganglia lacunar ischemic infarctions. - Mild chronic small vessel ischemic disease.  - No acute findings.    Orders Placed This Encounter  Procedures   CT ANGIO HEAD W OR WO CONTRAST   Creatinine, Serum   Ambulatory referral to Speech Therapy

## 2022-04-14 ENCOUNTER — Telehealth: Payer: Self-pay | Admitting: Neurology

## 2022-04-14 NOTE — Telephone Encounter (Signed)
Mcarthur Rossetti Josem Kaufmann: [phone] exp. 04/14/22-05/14/22 sent to GI [phone]

## 2022-04-15 ENCOUNTER — Ambulatory Visit: Payer: Medicare PPO | Attending: Neurology

## 2022-04-15 ENCOUNTER — Other Ambulatory Visit (INDEPENDENT_AMBULATORY_CARE_PROVIDER_SITE_OTHER): Payer: Medicare PPO

## 2022-04-15 ENCOUNTER — Encounter: Payer: Self-pay | Admitting: Neurology

## 2022-04-15 DIAGNOSIS — F801 Expressive language disorder: Secondary | ICD-10-CM | POA: Diagnosis not present

## 2022-04-15 DIAGNOSIS — R498 Other voice and resonance disorders: Secondary | ICD-10-CM | POA: Diagnosis not present

## 2022-04-15 DIAGNOSIS — R4789 Other speech disturbances: Secondary | ICD-10-CM | POA: Diagnosis not present

## 2022-04-15 DIAGNOSIS — R4701 Aphasia: Secondary | ICD-10-CM | POA: Insufficient documentation

## 2022-04-15 DIAGNOSIS — Z0289 Encounter for other administrative examinations: Secondary | ICD-10-CM

## 2022-04-15 NOTE — Telephone Encounter (Signed)
Updated [employer] auth:  [employer] Josem Kaufmann: [id number] (exp. 05/17/22 to 08/15/22)   Patient is scheduled at [facility] for 05/17/22 at 8 pm

## 2022-04-15 NOTE — Therapy (Signed)
OUTPATIENT SPEECH LANGUAGE PATHOLOGY EVALUATION   Patient Name: Yvette Short MRN: [id number] DOB:12-13-1945, 77 y.o., female Today's Date: 04/15/2022  PCP: Olam Idler DO REFERRING PROVIDER: Suzzanne Cloud, NP  END OF SESSION:  End of Session - 04/15/22 1411     Visit Number 1    Number of Visits 9    Date for SLP Re-Evaluation 06/10/22    Authorization Type [employer]    SLP Start Time 1315    SLP Stop Time  1400    SLP Time Calculation (min) 45 min    Activity Tolerance Patient tolerated treatment well             Past Medical History:  Diagnosis Date   Abnormal colonoscopy 03/2017   decreased rectal tone and polyp; 5 year recall   Abnormal findings on esophagogastroduodenoscopy (EGD) 02/25/2016   Z-line irrg. at 40 cm. No abnomrality of the esophagus to explain dysphagia. Esophagus dilated. H/o + H. Pylori   Allergy    ANXIETY 07/06/2007   Aortic atherosclerosis ([place]) 02/08/2018   Aortic valve regurgitation    Atherosclerosis of native coronary artery of native heart without angina pectoris 02/08/2018   Biceps tendonitis on right    Bronchiectasis without complication ([place]) 08/65/7846   CT 2012 IMPRESSION:  1. Apical scarring may account for the plain film abnormality.  2. A 6 mm right upper lobe pulmonary nodule.  - - HRCT 07/12/2017 >>>  Scattered minimal cylindrical and varicoid bronchiectasis with associated scattered mucoid impaction and minimal tree-in-bud opacity in both lungs, predominantly in the mid to lower lungs. Findings are stable to slightly worsened since 2015 ches   Chicken pox    Chronic cough 05/18/2017   CT chest 03/19/14  No bronchiectasis (not hrct)  Spirometry 05/18/2017  FEV1  2.15 (93%)  Ratio 76 s rx prior  - FENO 05/18/2017  =   Could not perform   - Allergy profile 05/18/2017 >  Eos 0.2 /  IgE  75 RAST pos cat > dog > mold - HRCT 07/12/2017 >>>  See bronchiectasis - trial of dymista 07/26/2017 > some better but not consistent with  it > rechallenge rx one puff each am 10/25/2017  - MCT 11/02/17 >     Colon polyp 03/31/2017   hyperplastic   COVID-19 08/04/2020   mild   Diverticulosis 03/2017   sigmoid   Family history of colon cancer 04/03/2017   Formatting of this note might be different from the original. 1/19 colon - no adenomas Recommend screening exam in 1/24 - [facility]   Family history of ovarian cancer    Genetic testing 03/26/2016   Negative genetic testing on the [facility] 30 gene panel.  The 30-Gene Cancer Panel offered by [facility] includes sequencing and/or deletion duplication testing of the following 30 genes: APC, ATM, BAP1, BARD1, BMPR1A, BRCA1, BRCA2, BRIP1, CDH1, CDK4, CDKN2A (p14ARF), CDKN2A (p16INK4a), CHEK2, EPCAM, GREM1, MITF, MLH1, MSH2, MSH6, MUTYH, NBN, PALB2, PMS2, POLD1, POLE, PTEN, RAD51C, RAD51D, SMAD4,    GERD 11/09/2006   H. pylori infection    h/o treated wiht prevpac   Hormone replacement therapy 01/08/2019   HYPERLIPIDEMIA 11/09/2006   Hypertension    Hypothyroidism 08/03/2017   Lumbar radiculopathy 07/24/2014   Migraine    Mild depression 02/14/2019   Pharyngoesophageal dysphagia 08/03/2017   Piriformis syndrome of right side    Spinal stenosis, lumbar region, with neurogenic claudication 10/16/2014   Epidural 10/02/2014, December 2016    Stomach ulcer 2008  Past Surgical History:  Procedure Laterality Date   CATARACT EXTRACTION Bilateral 04/2021   CERVICAL POLYPECTOMY  01/17/2019   LIPOMA EXCISION     of back   TUBAL LIGATION     Patient Active Problem List   Diagnosis Date Noted   Obstructive sleep apnea on CPAP 01/27/2022   LOC (loss of consciousness) ([place]) 01/26/2022   Non-restorative sleep 01/26/2022   Fatigue due to excessive exertion 01/26/2022   Bradycardia with 51-60 beats per minute 01/26/2022   Snoring 01/26/2022   Sleep disorder 12/17/2021   Rotator cuff tendinitis, right 10/09/2021   Word finding difficulty 06/30/2021   H. pylori infection    Aortic  valve regurgitation    Moderate aortic regurgitation 10/17/2019   Non-sustained ventricular tachycardia ([place]) 09/04/2019   Frequent PAC (premature atrial contraction) 09/04/2019   Rheumatic aortic valve insufficiency 09/04/2019   Hormone replacement therapy 01/08/2019   Aortic atherosclerosis ([place]) 02/08/2018   Atherosclerosis of native coronary artery of native heart without angina pectoris 02/08/2018   Hypothyroidism 08/03/2017   Pharyngoesophageal dysphagia 08/03/2017   Chronic cough 05/18/2017   Spinal stenosis, lumbar region, with neurogenic claudication 10/16/2014   Lumbar radiculopathy 07/24/2014   Bronchiectasis without complication ([place]) 33/29/5188   Essential hypertension 12/21/2010   Hyperlipidemia 11/09/2006   GERD 11/09/2006   Stomach ulcer 2008    ONSET DATE: within last 3 years (referral= 04/08/22)   REFERRING DIAG: R47.89 (ICD-10-CM) - Word finding difficulty  THERAPY DIAG:  Expressive language impairment  Other voice and resonance disorders  Rationale for Evaluation and Treatment: Rehabilitation  SUBJECTIVE:   SUBJECTIVE STATEMENT: "The words get stuck"  Pt accompanied by: self  PERTINENT HISTORY: A-fib, LOC, chronic cough, GERD, anxiety, recent MRI found chronic bilateral frontal corona radiata and right basal ganglia lacunar ischemic infarctions   PAIN: Are you having pain? No  FALLS: Has patient fallen in last 6 months?  No  LIVING ENVIRONMENT: Lives with: lives with their spouse Lives in: House/apartment  PLOF:  Level of assistance: Independent with ADLs, Independent with IADLs Employment: Retired  PATIENT GOALS: "not to have the stutter but I know it's not a stutter and the cough"   OBJECTIVE:   COGNITION: Overall cognitive status: Within functional limits for tasks assessed  AUDITORY COMPREHENSION: Overall auditory comprehension: Appears intact  READING COMPREHENSION: Intact  EXPRESSION: verbal  VERBAL EXPRESSION: Level of  generative/spontaneous verbalization: conversation Automatic speech: social response: intact  Repetition: Appears intact Naming: Confrontation: 76-100% Pragmatics: Appears intact Effective technique: open ended questions, semantic cues, sentence completion, and written cues Comments: inconsistent word finding reported in conversation which is frustrating and resulting in avoiding conversations   WRITTEN EXPRESSION: Dominant hand: right Written expression: Appears intact  MOTOR SPEECH: Overall motor speech: Appears intact Comments: Pt denied any changes in speech intelligibility, clarity, or difficulty forming words. Hx of TMJ  ORAL MOTOR EXAMINATION: Overall status: WFL  STANDARDIZED ASSESSMENTS: [place] NAMING TEST: 57/60 (debated one item)  PATIENT REPORTED OUTCOME MEASURES (PROM): Communication Participation Item Bank: 23   TODAY'S TREATMENT:  04-15-22: Educated patient on clinical observations and POC resulted in word retrieval difficulty. Pt inquired about ongoing chronic cough (previously saw ST) in which SLP provided re-education of targeted techniques and rationale. Pt would benefit from ongoing education and instruction to aid verbal expression and cough suppression. Pt agreeable to SLP recommendations and POC.    PATIENT EDUCATION: Education details: see above Person educated: Patient Education method: Customer service manager Education comprehension: verbalized understanding, returned demonstration, and needs further education   GOALS: Goals reviewed with patient? Yes  SHORT TERM GOALS: Target date: 05/13/2022  Pt will compensate for anomia/dysnomia with use of trained techniques given occasional min A for 2/2 opportunities  Baseline: Goal status: INITIAL  2.  Pt will utilize speech strategies to maintain message cohesion and  fluidity without abandoning conversation for 2/2 opportunities  Baseline:  Goal status: INITIAL  3.  Pt will carryover relaxation techniques to aid word finding and cough suppression given occasional min A for 2/2 opportunities  Baseline:  Goal status: INITIAL   LONG TERM GOALS: Target date: 06/10/2022   Pt will demonstrate adequate word retrieval and employ strategies as needed in 30+ minute conversation given rare min A Baseline:  Goal status: INITIAL  2.  Pt will carryover relaxation techniques to aid word finding and cough suppression given rare min A outside of therapy Baseline:  Goal status: INITIAL  3.  Pt will report improved communication effectiveness in conversation via PROM by 2 points at last ST session  Baseline: CPIB=23 Goal status: INITIAL   ASSESSMENT:  CLINICAL IMPRESSION: Patient is a 77 y.o. F who was seen today for word finding difficulty over last several years. Recently diagnosed with A-fib and recent MRI showed old infarcts with no acute findings. Endorsed difficulty with her words getting stuck in conversation resulting in frustration, abandoned utterances, and avoidance in conversation. No overt anomia with one possible dysnomia exhibited in extended conversation today. Endorsed varied frequency of word finding up to several times a week. Rare anomia x3 exhibited during confrontation naming test, in which pt utilized descriptions x2. Pt independently reports utilizing synonyms when word finding episodes occur. Pt also inquired about ongoing chronic cough (reportedly received telehealth ST services in past to address). Re-educated principles of relaxation strategies and abdominal breathing for cough suppression, which pt appreciated additional education and requested SLP continue to monitor and address as needed. Pt would benefit from skilled ST intervention to maximize communication effectiveness and optimize QOL.   OBJECTIVE IMPAIRMENTS: include expressive  language and voice disorder. These impairments are limiting patient from effectively communicating at home and in community. Factors affecting potential to achieve goals and functional outcome are  N/A . Patient will benefit from skilled SLP services to address above impairments and improve overall function.  REHAB POTENTIAL: Good  PLAN:  SLP FREQUENCY: 1x/week  SLP DURATION: 8 weeks  PLANNED INTERVENTIONS: Language facilitation, Environmental controls, Cueing hierachy, Cognitive reorganization, Internal/external aids, Functional tasks, Multimodal communication approach, SLP instruction and feedback, Compensatory strategies, and Patient/family education    Marzetta Board, [place] 04/15/2022, 2:47 PM

## 2022-04-16 DIAGNOSIS — H5340 Unspecified visual field defects: Secondary | ICD-10-CM | POA: Diagnosis not present

## 2022-04-16 DIAGNOSIS — H47323 Drusen of optic disc, bilateral: Secondary | ICD-10-CM | POA: Diagnosis not present

## 2022-04-16 LAB — CREATININE, SERUM
Creatinine, Ser: 0.97 mg/dL (ref 0.57–1.00)
eGFR: 61 mL/min/{1.73_m2} (ref >59)

## 2022-04-19 ENCOUNTER — Other Ambulatory Visit: Payer: Self-pay | Admitting: Family Medicine

## 2022-04-20 DIAGNOSIS — N950 Postmenopausal bleeding: Secondary | ICD-10-CM | POA: Diagnosis not present

## 2022-04-20 DIAGNOSIS — Z01419 Encounter for gynecological examination (general) (routine) without abnormal findings: Secondary | ICD-10-CM | POA: Diagnosis not present

## 2022-04-20 DIAGNOSIS — Z124 Encounter for screening for malignant neoplasm of cervix: Secondary | ICD-10-CM | POA: Diagnosis not present

## 2022-04-20 DIAGNOSIS — Z760 Encounter for issue of repeat prescription: Secondary | ICD-10-CM | POA: Diagnosis not present

## 2022-04-20 DIAGNOSIS — Z6822 Body mass index (BMI) 22.0-22.9, adult: Secondary | ICD-10-CM | POA: Diagnosis not present

## 2022-04-21 NOTE — Therapy (Unsigned)
OUTPATIENT SPEECH LANGUAGE PATHOLOGY TREATMENT   Patient Name: Yvette Short MRN: [id number] DOB:[birth year], 77 y.o., female Today's Date: 04/22/2022  PCP: Olam Idler DO REFERRING PROVIDER: Suzzanne Cloud, NP  END OF SESSION:  End of Session - 04/22/22 1010     Visit Number 2    Number of Visits 9    Date for SLP Re-Evaluation 06/10/22    Authorization Type [employer]    SLP Start Time 1015    SLP Stop Time  1100    SLP Time Calculation (min) 45 min    Activity Tolerance Patient tolerated treatment well              Past Medical History:  Diagnosis Date   Abnormal colonoscopy 03/2017   decreased rectal tone and polyp; 5 year recall   Abnormal findings on esophagogastroduodenoscopy (EGD) 02/25/2016   Z-line irrg. at 40 cm. No abnomrality of the esophagus to explain dysphagia. Esophagus dilated. H/o + H. Pylori   Allergy    ANXIETY 07/06/2007   Aortic atherosclerosis ([place]) 02/08/2018   Aortic valve regurgitation    Atherosclerosis of native coronary artery of native heart without angina pectoris 02/08/2018   Biceps tendonitis on right    Bronchiectasis without complication ([place]) 51/76/1607   CT 2012 IMPRESSION:  1. Apical scarring may account for the plain film abnormality.  2. A 6 mm right upper lobe pulmonary nodule.  - - HRCT 07/12/2017 >>>  Scattered minimal cylindrical and varicoid bronchiectasis with associated scattered mucoid impaction and minimal tree-in-bud opacity in both lungs, predominantly in the mid to lower lungs. Findings are stable to slightly worsened since 2015 ches   Chicken pox    Chronic cough 05/18/2017   CT chest 03/19/14  No bronchiectasis (not hrct)  Spirometry 05/18/2017  FEV1  2.15 (93%)  Ratio 76 s rx prior  - FENO 05/18/2017  =   Could not perform   - Allergy profile 05/18/2017 >  Eos 0.2 /  IgE  75 RAST pos cat > dog > mold - HRCT 07/12/2017 >>>  See bronchiectasis - trial of dymista 07/26/2017 > some better but not consistent with  it > rechallenge rx one puff each am 10/25/2017  - MCT 11/02/17 >     Colon polyp 03/31/2017   hyperplastic   COVID-19 08/04/2020   mild   Diverticulosis 03/2017   sigmoid   Family history of colon cancer 04/03/2017   Formatting of this note might be different from the original. 1/19 colon - no adenomas Recommend screening exam in 1/24 - [facility]   Family history of ovarian cancer    Genetic testing 03/26/2016   Negative genetic testing on the [facility] 30 gene panel.  The 30-Gene Cancer Panel offered by [facility] includes sequencing and/or deletion duplication testing of the following 30 genes: APC, ATM, BAP1, BARD1, BMPR1A, BRCA1, BRCA2, BRIP1, CDH1, CDK4, CDKN2A (p14ARF), CDKN2A (p16INK4a), CHEK2, EPCAM, GREM1, MITF, MLH1, MSH2, MSH6, MUTYH, NBN, PALB2, PMS2, POLD1, POLE, PTEN, RAD51C, RAD51D, SMAD4,    GERD 11/09/2006   H. pylori infection    h/o treated wiht prevpac   Hormone replacement therapy 01/08/2019   HYPERLIPIDEMIA 11/09/2006   Hypertension    Hypothyroidism 08/03/2017   Lumbar radiculopathy 07/24/2014   Migraine    Mild depression 02/14/2019   Pharyngoesophageal dysphagia 08/03/2017   Piriformis syndrome of right side    Spinal stenosis, lumbar region, with neurogenic claudication 10/16/2014   Epidural 10/02/2014, December 2016    Stomach ulcer 2008  Past Surgical History:  Procedure Laterality Date   CATARACT EXTRACTION Bilateral 04/2021   CERVICAL POLYPECTOMY  01/17/2019   LIPOMA EXCISION     of back   TUBAL LIGATION     Patient Active Problem List   Diagnosis Date Noted   Obstructive sleep apnea on CPAP 01/27/2022   LOC (loss of consciousness) ([place]) 01/26/2022   Non-restorative sleep 01/26/2022   Fatigue due to excessive exertion 01/26/2022   Bradycardia with 51-60 beats per minute 01/26/2022   Snoring 01/26/2022   Sleep disorder 12/17/2021   Rotator cuff tendinitis, right 10/09/2021   Word finding difficulty 06/30/2021   H. pylori infection    Aortic  valve regurgitation    Moderate aortic regurgitation 10/17/2019   Non-sustained ventricular tachycardia ([place]) 09/04/2019   Frequent PAC (premature atrial contraction) 09/04/2019   Rheumatic aortic valve insufficiency 09/04/2019   Hormone replacement therapy 01/08/2019   Aortic atherosclerosis ([place]) 02/08/2018   Atherosclerosis of native coronary artery of native heart without angina pectoris 02/08/2018   Hypothyroidism 08/03/2017   Pharyngoesophageal dysphagia 08/03/2017   Chronic cough 05/18/2017   Spinal stenosis, lumbar region, with neurogenic claudication 10/16/2014   Lumbar radiculopathy 07/24/2014   Bronchiectasis without complication ([place]) 21/19/4174   Essential hypertension 12/21/2010   Hyperlipidemia 11/09/2006   GERD 11/09/2006   Stomach ulcer 2008    ONSET DATE: within last 3 years (referral= 04/08/22)   REFERRING DIAG: R47.89 (ICD-10-CM) - Word finding difficulty  THERAPY DIAG:  Expressive language impairment  Other voice and resonance disorders  Rationale for Evaluation and Treatment: Rehabilitation  SUBJECTIVE:   SUBJECTIVE STATEMENT: "It only happened once since then"  Pt accompanied by: self  PERTINENT HISTORY: A-fib, LOC, chronic cough, GERD, anxiety, recent MRI found chronic bilateral frontal corona radiata and right basal ganglia lacunar ischemic infarctions   PAIN: Are you having pain? No  FALLS: Has patient fallen in last 6 months?  No  PATIENT GOALS: "not to have the stutter but I know it's not a stutter and the cough"   OBJECTIVE:  TODAY'S TREATMENT:                                                                                                                                         04-22-22: Pt reported one episode of "garbled" speech with words feeling "stuck" since ST eval. Despite questioning, difficult to determine presence of true anomia/dysnomia given vague feedback and limited direct observation in sessions. SLP educated and instructed  anomia strategies, specifically description. Pt able to provide thorough descriptions and convergently name items based on description with no overt difficulty today. Thorough picture description and accurate sentence generation with 2 targeted words exhibited, with rare cues needed to aid attention to specific words. Some possible reduced speech clarity noted, which appears related to observed jaw tightness. Pt denied impaired speech clarity or intelligibility so speech may be baseline. Educated patient on other factors  that could be causing word finding episodes, such as increased emotion, tension/stress, and/or sleep. Recommended using intentional breathing to reduce tension to assist with word retrieval as well as cough suppression.   04-15-22: Educated patient on clinical observations and POC related to word retrieval difficulty. Pt inquired about ongoing chronic cough (previously saw ST) in which SLP provided re-education of targeted techniques and rationale. Pt would benefit from ongoing education and instruction to aid verbal expression and cough suppression. Pt agreeable to SLP recommendations and POC.    PATIENT EDUCATION: Education details: see above Person educated: Patient Education method: Customer service manager Education comprehension: verbalized understanding, returned demonstration, and needs further education   GOALS: Goals reviewed with patient? Yes  SHORT TERM GOALS: Target date: 05/13/2022  Pt will compensate for anomia/dysnomia with use of trained techniques given occasional min A for 2/2 opportunities  Baseline: 04/22/22 Goal status: IN PROGRESS  2.  Pt will utilize speech strategies to maintain message cohesion and fluidity without abandoning conversation for 2/2 opportunities  Baseline: 04/22/22 Goal status: IN PROGRESS  3.  Pt will carryover relaxation techniques to aid word finding and cough suppression given occasional min A for 2/2 opportunities  Baseline:   Goal status: IN PROGRESS   LONG TERM GOALS: Target date: 06/10/2022   Pt will demonstrate adequate word retrieval and employ strategies as needed in 30+ minute conversation given rare min A Baseline:  Goal status: IN PROGRESS  2.  Pt will carryover relaxation techniques to aid word finding and cough suppression given rare min A outside of therapy Baseline:  Goal status: IN PROGRESS  3.  Pt will report improved communication effectiveness in conversation via PROM by 2 points at last ST session  Baseline: CPIB=23 Goal status: IN PROGRESS   ASSESSMENT:  CLINICAL IMPRESSION: Patient is a 77 y.o. F who was seen today for word finding difficulty over last several years. Recently diagnosed with A-fib and recent MRI showed old infarcts with no acute findings. Endorsed difficulty with her words getting stuck in conversation resulting in frustration, abandoned utterances, and avoidance in conversation. Education and instruction of word retrieval strategies conducted, including anomia strategies and intentional breathing to reduce tension when word finding occurs. Re-educated principles of relaxation strategies and abdominal breathing for cough suppression. Pt would benefit from skilled ST intervention to maximize communication effectiveness and optimize QOL.   OBJECTIVE IMPAIRMENTS: include expressive language and voice disorder. These impairments are limiting patient from effectively communicating at home and in community. Factors affecting potential to achieve goals and functional outcome are  N/A . Patient will benefit from skilled SLP services to address above impairments and improve overall function.  REHAB POTENTIAL: Good  PLAN:  SLP FREQUENCY: 1x/week  SLP DURATION: 8 weeks  PLANNED INTERVENTIONS: Language facilitation, Environmental controls, Cueing hierachy, Cognitive reorganization, Internal/external aids, Functional tasks, Multimodal communication approach, SLP instruction and  feedback, Compensatory strategies, and Patient/family education    Marzetta Board, CCC-SLP 04/22/2022, 11:26 AM

## 2022-04-22 ENCOUNTER — Ambulatory Visit: Payer: Medicare PPO

## 2022-04-22 DIAGNOSIS — F801 Expressive language disorder: Secondary | ICD-10-CM | POA: Diagnosis not present

## 2022-04-22 DIAGNOSIS — R498 Other voice and resonance disorders: Secondary | ICD-10-CM

## 2022-04-22 NOTE — Patient Instructions (Signed)

## 2022-04-24 ENCOUNTER — Other Ambulatory Visit: Payer: Self-pay | Admitting: Cardiology

## 2022-04-26 ENCOUNTER — Ambulatory Visit
Admission: RE | Admit: 2022-04-26 | Discharge: 2022-04-26 | Disposition: A | Discharge status: Home / Self Care | Payer: Medicare PPO | Source: Ambulatory Visit | Attending: Neurology | Admitting: Neurology

## 2022-04-26 DIAGNOSIS — I672 Cerebral atherosclerosis: Secondary | ICD-10-CM | POA: Diagnosis not present

## 2022-04-26 DIAGNOSIS — I6381 Other cerebral infarction due to occlusion or stenosis of small artery: Secondary | ICD-10-CM | POA: Diagnosis not present

## 2022-04-26 MED ORDER — IOPAMIDOL (ISOVUE-370) INJECTION 76%
75.0000 mL | Freq: Once | INTRAVENOUS | Status: AC | PRN
Start: 2022-04-26 — End: 2022-04-26
  Administered 2022-04-26: 75 mL via INTRAVENOUS

## 2022-04-27 ENCOUNTER — Ambulatory Visit: Payer: Medicare PPO

## 2022-04-27 DIAGNOSIS — F801 Expressive language disorder: Secondary | ICD-10-CM

## 2022-04-27 DIAGNOSIS — R498 Other voice and resonance disorders: Secondary | ICD-10-CM

## 2022-04-27 DIAGNOSIS — R4701 Aphasia: Secondary | ICD-10-CM | POA: Diagnosis not present

## 2022-04-27 NOTE — Patient Instructions (Addendum)
Yawn-Sigh (to release jaw tension): Take an easy breath through your mouth while yawning gently. When you breathe out, say an easy, prolonged "AH" 10x, 3x a day  To help the speech: Gather your thoughts before you speak ("think then speak") Slow down slightly and move mouth slightly bigger

## 2022-04-27 NOTE — Therapy (Signed)
OUTPATIENT SPEECH LANGUAGE PATHOLOGY TREATMENT   Patient Name: Yvette Short MRN: [id number] DOB:Aug 17, 1945, 77 y.o., female Today's Date: 04/27/2022  PCP: Olam Idler DO REFERRING PROVIDER: Suzzanne Cloud, NP  END OF SESSION:  End of Session - 04/27/22 1325     Visit Number 3    Number of Visits 9    Date for SLP Re-Evaluation 06/10/22    Authorization Type [employer]    SLP Start Time 1325   arrived late   SLP Stop Time  1400    SLP Time Calculation (min) 35 min    Activity Tolerance Patient tolerated treatment well               Past Medical History:  Diagnosis Date   Abnormal colonoscopy 03/2017   decreased rectal tone and polyp; 5 year recall   Abnormal findings on esophagogastroduodenoscopy (EGD) 02/25/2016   Z-line irrg. at 40 cm. No abnomrality of the esophagus to explain dysphagia. Esophagus dilated. H/o + H. Pylori   Allergy    ANXIETY 07/06/2007   Aortic atherosclerosis ([place]) 02/08/2018   Aortic valve regurgitation    Atherosclerosis of native coronary artery of native heart without angina pectoris 02/08/2018   Biceps tendonitis on right    Bronchiectasis without complication ([place]) 37/16/9678   CT 2012 IMPRESSION:  1. Apical scarring may account for the plain film abnormality.  2. A 6 mm right upper lobe pulmonary nodule.  - - HRCT 07/12/2017 >>>  Scattered minimal cylindrical and varicoid bronchiectasis with associated scattered mucoid impaction and minimal tree-in-bud opacity in both lungs, predominantly in the mid to lower lungs. Findings are stable to slightly worsened since 2015 ches   Chicken pox    Chronic cough 05/18/2017   CT chest 03/19/14  No bronchiectasis (not hrct)  Spirometry 05/18/2017  FEV1  2.15 (93%)  Ratio 76 s rx prior  - FENO 05/18/2017  =   Could not perform   - Allergy profile 05/18/2017 >  Eos 0.2 /  IgE  75 RAST pos cat > dog > mold - HRCT 07/12/2017 >>>  See bronchiectasis - trial of dymista 07/26/2017 > some better but not  consistent with it > rechallenge rx one puff each am 10/25/2017  - MCT 11/02/17 >     Colon polyp 03/31/2017   hyperplastic   COVID-19 08/04/2020   mild   Diverticulosis 03/2017   sigmoid   Family history of colon cancer 04/03/2017   Formatting of this note might be different from the original. 1/19 colon - no adenomas Recommend screening exam in 1/24 - [facility]   Family history of ovarian cancer    Genetic testing 03/26/2016   Negative genetic testing on the [facility] 30 gene panel.  The 30-Gene Cancer Panel offered by [facility] includes sequencing and/or deletion duplication testing of the following 30 genes: APC, ATM, BAP1, BARD1, BMPR1A, BRCA1, BRCA2, BRIP1, CDH1, CDK4, CDKN2A (p14ARF), CDKN2A (p16INK4a), CHEK2, EPCAM, GREM1, MITF, MLH1, MSH2, MSH6, MUTYH, NBN, PALB2, PMS2, POLD1, POLE, PTEN, RAD51C, RAD51D, SMAD4,    GERD 11/09/2006   H. pylori infection    h/o treated wiht prevpac   Hormone replacement therapy 01/08/2019   HYPERLIPIDEMIA 11/09/2006   Hypertension    Hypothyroidism 08/03/2017   Lumbar radiculopathy 07/24/2014   Migraine    Mild depression 02/14/2019   Pharyngoesophageal dysphagia 08/03/2017   Piriformis syndrome of right side    Spinal stenosis, lumbar region, with neurogenic claudication 10/16/2014   Epidural 10/02/2014, December 2016  Stomach ulcer 2008   Past Surgical History:  Procedure Laterality Date   CATARACT EXTRACTION Bilateral 04/2021   CERVICAL POLYPECTOMY  01/17/2019   LIPOMA EXCISION     of back   TUBAL LIGATION     Patient Active Problem List   Diagnosis Date Noted   Obstructive sleep apnea on CPAP 01/27/2022   LOC (loss of consciousness) ([place]) 01/26/2022   Non-restorative sleep 01/26/2022   Fatigue due to excessive exertion 01/26/2022   Bradycardia with 51-60 beats per minute 01/26/2022   Snoring 01/26/2022   Sleep disorder 12/17/2021   Rotator cuff tendinitis, right 10/09/2021   Word finding difficulty 06/30/2021   H. pylori  infection    Aortic valve regurgitation    Moderate aortic regurgitation 10/17/2019   Non-sustained ventricular tachycardia ([place]) 09/04/2019   Frequent PAC (premature atrial contraction) 09/04/2019   Rheumatic aortic valve insufficiency 09/04/2019   Hormone replacement therapy 01/08/2019   Aortic atherosclerosis ([place]) 02/08/2018   Atherosclerosis of native coronary artery of native heart without angina pectoris 02/08/2018   Hypothyroidism 08/03/2017   Pharyngoesophageal dysphagia 08/03/2017   Chronic cough 05/18/2017   Spinal stenosis, lumbar region, with neurogenic claudication 10/16/2014   Lumbar radiculopathy 07/24/2014   Bronchiectasis without complication ([place]) 28/78/6767   Essential hypertension 12/21/2010   Hyperlipidemia 11/09/2006   GERD 11/09/2006   Stomach ulcer 2008    ONSET DATE: within last 3 years (referral= 04/08/22)   REFERRING DIAG: R47.89 (ICD-10-CM) - Word finding difficulty  THERAPY DIAG:  Expressive language impairment  Other voice and resonance disorders  Rationale for Evaluation and Treatment: Rehabilitation  SUBJECTIVE:   SUBJECTIVE STATEMENT: "I had a CT scan. It didn't show anything different" Pt accompanied by: self  PERTINENT HISTORY: A-fib, LOC, chronic cough, GERD, anxiety, recent MRI found chronic bilateral frontal corona radiata and right basal ganglia lacunar ischemic infarctions   PAIN: Are you having pain? No  FALLS: Has patient fallen in last 6 months?  No  PATIENT GOALS: "not to have the stutter but I know it's not a stutter and the cough"   OBJECTIVE:  TODAY'S TREATMENT:                                                                                                                                         04-27-22: Pt reports recent speech difficulties are "more frequent but less severe" with benefit of focusing on gathering thoughts prior to speaking. In conversation today, pt exhibited rare pausing and initial sound repetition x2  which were not overall detrimental to message cohesion or fluidity. SLP recommended slowing rate, mildly over-articulating, and processing prior to speaking with some intermittent benefit noted in subsequent conversation. No overt anomia noted today. Discussed additional techniques to reduce facial/upper body tension, which may be contributing to reduced jaw movement and pausing in conversation.   04-22-22: Pt reported one episode of "garbled" speech with words feeling "stuck" since  ST eval. Despite questioning, difficult to determine presence of true anomia/dysnomia given vague feedback and limited direct observation in sessions. SLP educated and instructed anomia strategies, specifically description. Pt able to provide thorough descriptions and convergently name items based on description with no overt difficulty today. Thorough picture description and accurate sentence generation with 2 targeted words exhibited, with rare cues needed to aid attention to specific words. Some possible reduced speech clarity noted, which appears related to observed jaw tightness. Pt denied impaired speech clarity or intelligibility so speech may be baseline. Educated patient on other factors that could be causing word finding episodes, such as increased emotion, tension/stress, and/or sleep. Recommended using intentional breathing to reduce tension to assist with word retrieval as well as cough suppression.   04-15-22: Educated patient on clinical observations and POC related to word retrieval difficulty. Pt inquired about ongoing chronic cough (previously saw ST) in which SLP provided re-education of targeted techniques and rationale. Pt would benefit from ongoing education and instruction to aid verbal expression and cough suppression. Pt agreeable to SLP recommendations and POC.    PATIENT EDUCATION: Education details: see above Person educated: Patient Education method: Customer service manager Education  comprehension: verbalized understanding, returned demonstration, and needs further education   GOALS: Goals reviewed with patient? Yes  SHORT TERM GOALS: Target date: 05/13/2022  Pt will compensate for anomia/dysnomia with use of trained techniques given occasional min A for 2/2 opportunities  Baseline: 04/22/22 Goal status: IN PROGRESS  2.  Pt will utilize speech strategies to maintain message cohesion and fluidity without abandoning conversation for 2/2 opportunities  Baseline: 04/22/22, 04/27/22 Goal status: MET  3.  Pt will carryover relaxation techniques to aid word finding and cough suppression given occasional min A for 2/2 opportunities  Baseline: 04/27/22 Goal status: IN PROGRESS   LONG TERM GOALS: Target date: 06/10/2022   Pt will demonstrate adequate word retrieval and employ strategies as needed in 30+ minute conversation given rare min A Baseline:  Goal status: IN PROGRESS  2.  Pt will carryover relaxation techniques to aid word finding and cough suppression given rare min A outside of therapy Baseline:  Goal status: IN PROGRESS  3.  Pt will report improved communication effectiveness in conversation via PROM by 2 points at last ST session  Baseline: CPIB=23 Goal status: IN PROGRESS   ASSESSMENT:  CLINICAL IMPRESSION: Patient is a 77 y.o. F who was seen today for word finding difficulty over last several years. Recently diagnosed with A-fib and recent MRI showed old infarcts with no acute findings. Endorsed difficulty with her words getting stuck in conversation resulting in frustration, abandoned utterances, and avoidance in conversation. Education and instruction of word retrieval strategies and speech intelligibility compensations conducted. Re-educated principles of relaxation strategies and abdominal breathing for cough suppression and tension management. Pt would benefit from skilled ST intervention to maximize communication effectiveness and optimize QOL.    OBJECTIVE IMPAIRMENTS: include expressive language and voice disorder. These impairments are limiting patient from effectively communicating at home and in community. Factors affecting potential to achieve goals and functional outcome are  N/A . Patient will benefit from skilled SLP services to address above impairments and improve overall function.  REHAB POTENTIAL: Good  PLAN:  SLP FREQUENCY: 1x/week  SLP DURATION: 8 weeks  PLANNED INTERVENTIONS: Language facilitation, Environmental controls, Cueing hierachy, Cognitive reorganization, Internal/external aids, Functional tasks, Multimodal communication approach, SLP instruction and feedback, Compensatory strategies, and Patient/family education    Marzetta Board, [place] 04/27/2022, 2:56 PM

## 2022-04-29 ENCOUNTER — Telehealth: Payer: Self-pay | Admitting: Neurology

## 2022-04-29 DIAGNOSIS — G4733 Obstructive sleep apnea (adult) (pediatric): Secondary | ICD-10-CM

## 2022-04-29 NOTE — Telephone Encounter (Addendum)
Dr. Brett Fairy, would you mind briefly reviewing patient's last office visit with you.  Had HST in October 2023 showed mild apnea, but you suggested CPAP titration to evaluate sudden oxygen desaturation and bradycardia.  She is scheduled in mid February for this.  Since her visit with you she has been diagnosed with A-fib.  Would A-fib explain abnormalities on HST?  I did MRI of the brain showing chronic bilateral frontal corona radiata and right basal ganglia lacunar ischemic infarctions.  She is on Eliquis.

## 2022-05-03 ENCOUNTER — Ambulatory Visit: Payer: Medicare PPO | Attending: Neurology

## 2022-05-03 DIAGNOSIS — F801 Expressive language disorder: Secondary | ICD-10-CM | POA: Insufficient documentation

## 2022-05-03 DIAGNOSIS — R498 Other voice and resonance disorders: Secondary | ICD-10-CM | POA: Insufficient documentation

## 2022-05-03 NOTE — Therapy (Signed)
OUTPATIENT SPEECH LANGUAGE PATHOLOGY TREATMENT   Patient Name: Yvette Short MRN: [id number] DOB:1945-10-19, 77 y.o., female Today's Date: 05/03/2022  PCP: Olam Idler DO REFERRING PROVIDER: Suzzanne Cloud, NP  END OF SESSION:  End of Session - 05/03/22 1241     Visit Number 4    Number of Visits 9    Date for SLP Re-Evaluation 06/10/22    Authorization Type [employer]    SLP Start Time 1239   delay for check in error   SLP Stop Time  1315    SLP Time Calculation (min) 36 min    Activity Tolerance Patient tolerated treatment well                Past Medical History:  Diagnosis Date   Abnormal colonoscopy 03/2017   decreased rectal tone and polyp; 5 year recall   Abnormal findings on esophagogastroduodenoscopy (EGD) 02/25/2016   Z-line irrg. at 40 cm. No abnomrality of the esophagus to explain dysphagia. Esophagus dilated. H/o + H. Pylori   Allergy    ANXIETY 07/06/2007   Aortic atherosclerosis ([place]) 02/08/2018   Aortic valve regurgitation    Atherosclerosis of native coronary artery of native heart without angina pectoris 02/08/2018   Biceps tendonitis on right    Bronchiectasis without complication ([place]) 30/86/5784   CT 2012 IMPRESSION:  1. Apical scarring may account for the plain film abnormality.  2. A 6 mm right upper lobe pulmonary nodule.  - - HRCT 07/12/2017 >>>  Scattered minimal cylindrical and varicoid bronchiectasis with associated scattered mucoid impaction and minimal tree-in-bud opacity in both lungs, predominantly in the mid to lower lungs. Findings are stable to slightly worsened since 2015 ches   Chicken pox    Chronic cough 05/18/2017   CT chest 03/19/14  No bronchiectasis (not hrct)  Spirometry 05/18/2017  FEV1  2.15 (93%)  Ratio 76 s rx prior  - FENO 05/18/2017  =   Could not perform   - Allergy profile 05/18/2017 >  Eos 0.2 /  IgE  75 RAST pos cat > dog > mold - HRCT 07/12/2017 >>>  See bronchiectasis - trial of dymista 07/26/2017 > some  better but not consistent with it > rechallenge rx one puff each am 10/25/2017  - MCT 11/02/17 >     Colon polyp 03/31/2017   hyperplastic   COVID-19 08/04/2020   mild   Diverticulosis 03/2017   sigmoid   Family history of colon cancer 04/03/2017   Formatting of this note might be different from the original. 1/19 colon - no adenomas Recommend screening exam in 1/24 - [facility]   Family history of ovarian cancer    Genetic testing 03/26/2016   Negative genetic testing on the [facility] 30 gene panel.  The 30-Gene Cancer Panel offered by [facility] includes sequencing and/or deletion duplication testing of the following 30 genes: APC, ATM, BAP1, BARD1, BMPR1A, BRCA1, BRCA2, BRIP1, CDH1, CDK4, CDKN2A (p14ARF), CDKN2A (p16INK4a), CHEK2, EPCAM, GREM1, MITF, MLH1, MSH2, MSH6, MUTYH, NBN, PALB2, PMS2, POLD1, POLE, PTEN, RAD51C, RAD51D, SMAD4,    GERD 11/09/2006   H. pylori infection    h/o treated wiht prevpac   Hormone replacement therapy 01/08/2019   HYPERLIPIDEMIA 11/09/2006   Hypertension    Hypothyroidism 08/03/2017   Lumbar radiculopathy 07/24/2014   Migraine    Mild depression 02/14/2019   Pharyngoesophageal dysphagia 08/03/2017   Piriformis syndrome of right side    Spinal stenosis, lumbar region, with neurogenic claudication 10/16/2014   Epidural 10/02/2014,  December 2016    Stomach ulcer 2008   Past Surgical History:  Procedure Laterality Date   CATARACT EXTRACTION Bilateral 04/2021   CERVICAL POLYPECTOMY  01/17/2019   LIPOMA EXCISION     of back   TUBAL LIGATION     Patient Active Problem List   Diagnosis Date Noted   Obstructive sleep apnea on CPAP 01/27/2022   LOC (loss of consciousness) ([place]) 01/26/2022   Non-restorative sleep 01/26/2022   Fatigue due to excessive exertion 01/26/2022   Bradycardia with 51-60 beats per minute 01/26/2022   Snoring 01/26/2022   Sleep disorder 12/17/2021   Rotator cuff tendinitis, right 10/09/2021   Word finding difficulty 06/30/2021    H. pylori infection    Aortic valve regurgitation    Moderate aortic regurgitation 10/17/2019   Non-sustained ventricular tachycardia ([place]) 09/04/2019   Frequent PAC (premature atrial contraction) 09/04/2019   Rheumatic aortic valve insufficiency 09/04/2019   Hormone replacement therapy 01/08/2019   Aortic atherosclerosis ([place]) 02/08/2018   Atherosclerosis of native coronary artery of native heart without angina pectoris 02/08/2018   Hypothyroidism 08/03/2017   Pharyngoesophageal dysphagia 08/03/2017   Chronic cough 05/18/2017   Spinal stenosis, lumbar region, with neurogenic claudication 10/16/2014   Lumbar radiculopathy 07/24/2014   Bronchiectasis without complication ([place]) 41/32/4401   Essential hypertension 12/21/2010   Hyperlipidemia 11/09/2006   GERD 11/09/2006   Stomach ulcer 2008    ONSET DATE: within last 3 years (referral= 04/08/22)   REFERRING DIAG: R47.89 (ICD-10-CM) - Word finding difficulty  THERAPY DIAG:  Expressive language impairment  Other voice and resonance disorders  Rationale for Evaluation and Treatment: Rehabilitation  SUBJECTIVE:   SUBJECTIVE STATEMENT: "My speech has been pretty okay"  Pt accompanied by: self  PERTINENT HISTORY: A-fib, LOC, chronic cough, GERD, anxiety, recent MRI found chronic bilateral frontal corona radiata and right basal ganglia lacunar ischemic infarctions   PAIN: Are you having pain? No  FALLS: Has patient fallen in last 6 months?  No  PATIENT GOALS: "not to have the stutter but I know it's not a stutter and the cough"   OBJECTIVE:  TODAY'S TREATMENT:                                                                                                                                         05-03-22: Some subjective improvements in speech and cough reported since beginning ST intervention. Rare pauses x2 exhibited in conversation with no other evidence of overt word finding retrieval deficits. Pt commented on some micro pausing;  however, these minimal pauses did not derail overall message fluidity or cohesion. Jaw tension appeared less noticeable today, with improved speech clarity exhibited. Re-educated importance of increasing speaking opportunities to practice word retrieval strategies and engage cognition. Pt verbalized understanding and agreement.   04-27-22: Pt reports recent speech difficulties are "more frequent but less severe" with benefit of focusing on gathering thoughts prior to speaking.  In conversation today, pt exhibited rare pausing and initial sound repetition x2 which were not overall detrimental to message cohesion or fluidity. SLP recommended slowing rate, mildly over-articulating, and processing prior to speaking with some intermittent benefit noted in subsequent conversation. No overt anomia noted today. Discussed additional techniques to reduce facial/upper body tension, which may be contributing to reduced jaw movement and pausing in conversation.   04-22-22: Pt reported one episode of "garbled" speech with words feeling "stuck" since ST eval. Despite questioning, difficult to determine presence of true anomia/dysnomia given vague feedback and limited direct observation in sessions. SLP educated and instructed anomia strategies, specifically description. Pt able to provide thorough descriptions and convergently name items based on description with no overt difficulty today. Thorough picture description and accurate sentence generation with 2 targeted words exhibited, with rare cues needed to aid attention to specific words. Some possible reduced speech clarity noted, which appears related to observed jaw tightness. Pt denied impaired speech clarity or intelligibility so speech may be baseline. Educated patient on other factors that could be causing word finding episodes, such as increased emotion, tension/stress, and/or sleep. Recommended using intentional breathing to reduce tension to assist with word retrieval  as well as cough suppression.   04-15-22: Educated patient on clinical observations and POC related to word retrieval difficulty. Pt inquired about ongoing chronic cough (previously saw ST) in which SLP provided re-education of targeted techniques and rationale. Pt would benefit from ongoing education and instruction to aid verbal expression and cough suppression. Pt agreeable to SLP recommendations and POC.    PATIENT EDUCATION: Education details: see above Person educated: Patient Education method: Customer service manager Education comprehension: verbalized understanding, returned demonstration, and needs further education   GOALS: Goals reviewed with patient? Yes  SHORT TERM GOALS: Target date: 05/13/2022  Pt will compensate for anomia/dysnomia with use of trained techniques given occasional min A for 2/2 opportunities  Baseline: 04/22/22, 05/03/22 Goal status: MET  2.  Pt will utilize speech strategies to maintain message cohesion and fluidity without abandoning conversation for 2/2 opportunities  Baseline: 04/22/22, 04/27/22 Goal status: MET  3.  Pt will carryover relaxation techniques to aid word finding and cough suppression given occasional min A for 2/2 opportunities  Baseline: 04/27/22, 05/03/22 Goal status: MET   LONG TERM GOALS: Target date: 06/10/2022   Pt will demonstrate adequate word retrieval and employ strategies as needed in 30+ minute conversation given rare min A Baseline:  Goal status: IN PROGRESS  2.  Pt will carryover relaxation techniques to aid word finding and cough suppression given rare min A outside of therapy Baseline:  Goal status: IN PROGRESS  3.  Pt will report improved communication effectiveness in conversation via PROM by 2 points at last ST session  Baseline: CPIB=23 Goal status: IN PROGRESS   ASSESSMENT:  CLINICAL IMPRESSION: Patient is a 77 y.o. F who was seen today for word finding difficulty over last several years. Ongoing  education and instruction of word retrieval strategies and speech intelligibility compensations completed. Rare speech errors exhibited in extended conversation today. Pt would benefit from skilled ST intervention to maximize communication effectiveness and optimize QOL.   OBJECTIVE IMPAIRMENTS: include expressive language and voice disorder. These impairments are limiting patient from effectively communicating at home and in community. Factors affecting potential to achieve goals and functional outcome are  N/A . Patient will benefit from skilled SLP services to address above impairments and improve overall function.  REHAB POTENTIAL: Good  PLAN:  SLP FREQUENCY:  1x/week  SLP DURATION: 8 weeks  PLANNED INTERVENTIONS: Language facilitation, Environmental controls, Cueing hierachy, Cognitive reorganization, Internal/external aids, Functional tasks, Multimodal communication approach, SLP instruction and feedback, Compensatory strategies, and Patient/family education    Marzetta Board, [place] 05/03/2022, 2:12 PM

## 2022-05-03 NOTE — Patient Instructions (Addendum)
Recommendations:  Consider how much you are talking each day  If minimal talking each day, consider calling a friend  Remember: if you don't use it, you lose it. Talking is a great way to engage your brain! Continue with yawn-sign gestures to release jaw tension

## 2022-05-04 NOTE — Telephone Encounter (Signed)
I reviewed plan with Dr. Brett Fairy, [facility] in sleep lab. I will go ahead and order auto cpap to start, please cancel in lab sleep titration. Schedule for 31-90 day revisit with me to look at data, try to see on sooner end of start CPAP. I will order ONO to evaluate oxygen.   IMPRESSION:  This HST confirms the presence of mild apnea which appears to be REM sleep dependent.  There were several sudden oxygen desaturation seen, very brief in duration and associated with REM sleep.  Unfortunately, we do not have data about sleep position or snoring.   RECOMMENDATION: I am certainly recommending  CPAP therapy for this patient, I would prefer an in lab titration to correlate apnea to position, snoring, EEG and cardiac rhythm.   Should insurance decline this plan I would follow-up with an auto titration CPAP device instead, setting between 5 and 12 cmH2O, 1 cm EPR, heated humidification to be adjusted by the patient and interface of choice for the comfort of this patient.   Orders Placed This Encounter  Procedures   For home use only DME continuous positive airway pressure (CPAP)   Pulse oximetry, overnight

## 2022-05-04 NOTE — Addendum Note (Signed)
Addended by: Suzzanne Cloud on: 05/04/2022 09:55 AM   Modules accepted: Orders

## 2022-05-04 NOTE — Telephone Encounter (Signed)
Community message sent about new orders

## 2022-05-12 ENCOUNTER — Ambulatory Visit: Payer: Medicare PPO

## 2022-05-12 DIAGNOSIS — F801 Expressive language disorder: Secondary | ICD-10-CM

## 2022-05-12 DIAGNOSIS — R498 Other voice and resonance disorders: Secondary | ICD-10-CM

## 2022-05-12 NOTE — Therapy (Signed)
OUTPATIENT SPEECH LANGUAGE PATHOLOGY TREATMENT (DISCHARGE)   Patient Name: Yvette Short MRN: [id number] DOB:[birth year], 77 y.o., female Today's Date: 05/12/2022  PCP: Olam Idler DO REFERRING PROVIDER: Suzzanne Cloud, NP  END OF SESSION:  End of Session - 05/12/22 1317     Visit Number 5    Number of Visits 9    Date for SLP Re-Evaluation 06/10/22    Authorization Type [employer]    SLP Start Time 1317    SLP Stop Time  1350    SLP Time Calculation (min) 33 min    Activity Tolerance Patient tolerated treatment well              SPEECH THERAPY DISCHARGE SUMMARY  Visits from Start of Care: 5  Current functional level related to goals / functional outcomes: Good carryover and implementation of SLP recommendations reported and exhibited. Subjective improvements in speech and coughing reported, with rare difficulty noted in ST sessions. Pt is pleased with current progress and agreeable to ST discharge this date.    Remaining deficits: N/A   Education / Equipment: Anomia strategies, speech intelligibility strategies, cough suppression techniques    Patient agrees to discharge. Patient goals were met. Patient is being discharged due to meeting the stated rehab goals.      Past Medical History:  Diagnosis Date   Abnormal colonoscopy 03/2017   decreased rectal tone and polyp; 5 year recall   Abnormal findings on esophagogastroduodenoscopy (EGD) 02/25/2016   Z-line irrg. at 40 cm. No abnomrality of the esophagus to explain dysphagia. Esophagus dilated. H/o + H. Pylori   Allergy    ANXIETY 07/06/2007   Aortic atherosclerosis ([place]) 02/08/2018   Aortic valve regurgitation    Atherosclerosis of native coronary artery of native heart without angina pectoris 02/08/2018   Biceps tendonitis on right    Bronchiectasis without complication ([place]) [id number]   CT 2012 IMPRESSION:  1. Apical scarring may account for the plain film abnormality.  2. A 6 mm right  upper lobe pulmonary nodule.  - - HRCT 07/12/2017 >>>  Scattered minimal cylindrical and varicoid bronchiectasis with associated scattered mucoid impaction and minimal tree-in-bud opacity in both lungs, predominantly in the mid to lower lungs. Findings are stable to slightly worsened since 2015 ches   Chicken pox    Chronic cough 05/18/2017   CT chest 03/19/14  No bronchiectasis (not hrct)  Spirometry 05/18/2017  FEV1  2.15 (93%)  Ratio 76 s rx prior  - FENO 05/18/2017  =   Could not perform   - Allergy profile 05/18/2017 >  Eos 0.2 /  IgE  75 RAST pos cat > dog > mold - HRCT 07/12/2017 >>>  See bronchiectasis - trial of dymista 07/26/2017 > some better but not consistent with it > rechallenge rx one puff each am 10/25/2017  - MCT 11/02/17 >     Colon polyp 03/31/2017   hyperplastic   COVID-19 08/04/2020   mild   Diverticulosis 03/2017   sigmoid   Family history of colon cancer 04/03/2017   Formatting of this note might be different from the original. 1/19 colon - no adenomas Recommend screening exam in 1/24 - [facility]   Family history of ovarian cancer    Genetic testing 03/26/2016   Negative genetic testing on the [facility] 30 gene panel.  The 30-Gene Cancer Panel offered by [facility] includes sequencing and/or deletion duplication testing of the following 30 genes: APC, ATM, BAP1, BARD1, BMPR1A, BRCA1, BRCA2, BRIP1, CDH1, CDK4,  CDKN2A (p14ARF), CDKN2A (p16INK4a), CHEK2, EPCAM, GREM1, MITF, MLH1, MSH2, MSH6, MUTYH, NBN, PALB2, PMS2, POLD1, POLE, PTEN, RAD51C, RAD51D, SMAD4,    GERD 11/09/2006   H. pylori infection    h/o treated wiht prevpac   Hormone replacement therapy 01/08/2019   HYPERLIPIDEMIA 11/09/2006   Hypertension    Hypothyroidism 08/03/2017   Lumbar radiculopathy 07/24/2014   Migraine    Mild depression 02/14/2019   Pharyngoesophageal dysphagia 08/03/2017   Piriformis syndrome of right side    Spinal stenosis, lumbar region, with neurogenic claudication 10/16/2014   Epidural  10/02/2014, December 2016    Stomach ulcer 2008   Past Surgical History:  Procedure Laterality Date   CATARACT EXTRACTION Bilateral 04/2021   CERVICAL POLYPECTOMY  01/17/2019   LIPOMA EXCISION     of back   TUBAL LIGATION     Patient Active Problem List   Diagnosis Date Noted   Obstructive sleep apnea on CPAP 01/27/2022   LOC (loss of consciousness) ([place]) 01/26/2022   Non-restorative sleep 01/26/2022   Fatigue due to excessive exertion 01/26/2022   Bradycardia with 51-60 beats per minute 01/26/2022   Snoring 01/26/2022   Sleep disorder 12/17/2021   Rotator cuff tendinitis, right 10/09/2021   Word finding difficulty 06/30/2021   H. pylori infection    Aortic valve regurgitation    Moderate aortic regurgitation 10/17/2019   Non-sustained ventricular tachycardia ([place]) 09/04/2019   Frequent PAC (premature atrial contraction) 09/04/2019   Rheumatic aortic valve insufficiency 09/04/2019   Hormone replacement therapy 01/08/2019   Aortic atherosclerosis ([place]) 02/08/2018   Atherosclerosis of native coronary artery of native heart without angina pectoris 02/08/2018   Hypothyroidism 08/03/2017   Pharyngoesophageal dysphagia 08/03/2017   Chronic cough 05/18/2017   Spinal stenosis, lumbar region, with neurogenic claudication 10/16/2014   Lumbar radiculopathy 07/24/2014   Bronchiectasis without complication ([place]) [id number]   Essential hypertension 12/21/2010   Hyperlipidemia 11/09/2006   GERD 11/09/2006   Stomach ulcer 2008    ONSET DATE: within last 3 years (referral= 04/08/22)   REFERRING DIAG: R47.89 (ICD-10-CM) - Word finding difficulty  THERAPY DIAG:  Expressive language impairment  Other voice and resonance disorders  Rationale for Evaluation and Treatment: Rehabilitation  SUBJECTIVE:   SUBJECTIVE STATEMENT: "not too bad actually" Pt accompanied by: self  PERTINENT HISTORY: A-fib, LOC, chronic cough, GERD, anxiety, recent MRI found chronic bilateral frontal corona  radiata and right basal ganglia lacunar ischemic infarctions   PAIN: Are you having pain? No  FALLS: Has patient fallen in last 6 months?  No  PATIENT GOALS: "not to have the stutter but I know it's not a stutter and the cough"   OBJECTIVE:  TODAY'S TREATMENT:                                                                                                                                         05-12-22: Denied any overt difficulty communicating since  last ST session. Occasionally reported using strategy to talk around word or using synonym with success. Discussed book with rare dysnomia exhibited x1. Improved speech clarity also noted in conversation. Additional education, instruction, and modeling of cough suppression techniques and throat clear alternatives provided. Rare min A required to utilize with success. VCD handout provided. Pt is pleased with current progress and agreeable to ST discharge this date.   05-03-22: Some subjective improvements in speech and cough reported since beginning ST intervention. Rare pauses x2 exhibited in conversation with no other evidence of overt word finding retrieval deficits. Pt commented on some micro pausing; however, these minimal pauses did not derail overall message fluidity or cohesion. Jaw tension appeared less noticeable today, with improved speech clarity exhibited. Re-educated importance of increasing speaking opportunities to practice word retrieval strategies and engage cognition. Pt verbalized understanding and agreement.   04-27-22: Pt reports recent speech difficulties are "more frequent but less severe" with benefit of focusing on gathering thoughts prior to speaking. In conversation today, pt exhibited rare pausing and initial sound repetition x2 which were not overall detrimental to message cohesion or fluidity. SLP recommended slowing rate, mildly over-articulating, and processing prior to speaking with some intermittent benefit noted in subsequent  conversation. No overt anomia noted today. Discussed additional techniques to reduce facial/upper body tension, which may be contributing to reduced jaw movement and pausing in conversation.   04-22-22: Pt reported one episode of "garbled" speech with words feeling "stuck" since ST eval. Despite questioning, difficult to determine presence of true anomia/dysnomia given vague feedback and limited direct observation in sessions. SLP educated and instructed anomia strategies, specifically description. Pt able to provide thorough descriptions and convergently name items based on description with no overt difficulty today. Thorough picture description and accurate sentence generation with 2 targeted words exhibited, with rare cues needed to aid attention to specific words. Some possible reduced speech clarity noted, which appears related to observed jaw tightness. Pt denied impaired speech clarity or intelligibility so speech may be baseline. Educated patient on other factors that could be causing word finding episodes, such as increased emotion, tension/stress, and/or sleep. Recommended using intentional breathing to reduce tension to assist with word retrieval as well as cough suppression.   04-15-22: Educated patient on clinical observations and POC related to word retrieval difficulty. Pt inquired about ongoing chronic cough (previously saw ST) in which SLP provided re-education of targeted techniques and rationale. Pt would benefit from ongoing education and instruction to aid verbal expression and cough suppression. Pt agreeable to SLP recommendations and POC.    PATIENT EDUCATION: Education details: see above Person educated: Patient Education method: Customer service manager Education comprehension: verbalized understanding, returned demonstration, and needs further education   GOALS: Goals reviewed with patient? Yes  SHORT TERM GOALS: Target date: 05/13/2022  Pt will compensate for  anomia/dysnomia with use of trained techniques given occasional min A for 2/2 opportunities  Baseline: 04/22/22, 05/03/22 Goal status: MET  2.  Pt will utilize speech strategies to maintain message cohesion and fluidity without abandoning conversation for 2/2 opportunities  Baseline: 04/22/22, 04/27/22 Goal status: MET  3.  Pt will carryover relaxation techniques to aid word finding and cough suppression given occasional min A for 2/2 opportunities  Baseline: 04/27/22, 05/03/22 Goal status: MET   LONG TERM GOALS: Target date: 06/10/2022   Pt will demonstrate adequate word retrieval and employ strategies as needed in 30+ minute conversation given rare min A Baseline:  Goal status: MET  2.  Pt  will carryover relaxation techniques to aid word finding and cough suppression given rare min A outside of therapy Baseline:  Goal status: MET  3.  Pt will report improved communication effectiveness in conversation via PROM by 2 points at last ST session  Baseline: CPIB=23 Goal status: MET   ASSESSMENT:  CLINICAL IMPRESSION: Patient is a 77 y.o. F who was seen today for word finding difficulty over last several years. Rare dysnomia  x1 (self-corrected immediately) exhibited in extended conversation today. Completed education and instruction of word retrieval strategies, speech intelligibility compensations, and cough suppression techniques, with patient able to carryover and implement with rare min A. Pt is pleased with current level of progress and agreeable to ST discharge this date.   OBJECTIVE IMPAIRMENTS: include expressive language and voice disorder. These impairments are limiting patient from effectively communicating at home and in community. Factors affecting potential to achieve goals and functional outcome are  N/A . Patient will benefit from skilled SLP services to address above impairments and improve overall function.  REHAB POTENTIAL: Good  PLAN:  SLP FREQUENCY: 1x/week  SLP  DURATION: 8 weeks  PLANNED INTERVENTIONS: Language facilitation, Environmental controls, Cueing hierachy, Cognitive reorganization, Internal/external aids, Functional tasks, Multimodal communication approach, SLP instruction and feedback, Compensatory strategies, and Patient/family education    Marzetta Board, CCC-SLP 05/12/2022, 1:52 PM

## 2022-05-14 ENCOUNTER — Other Ambulatory Visit: Payer: Medicare PPO

## 2022-05-17 ENCOUNTER — Ambulatory Visit (INDEPENDENT_AMBULATORY_CARE_PROVIDER_SITE_OTHER): Payer: Medicare PPO | Admitting: Neurology

## 2022-05-17 DIAGNOSIS — T733XXD Exhaustion due to excessive exertion, subsequent encounter: Secondary | ICD-10-CM

## 2022-05-17 DIAGNOSIS — R4020 Unspecified coma: Secondary | ICD-10-CM

## 2022-05-17 DIAGNOSIS — I480 Paroxysmal atrial fibrillation: Secondary | ICD-10-CM

## 2022-05-17 DIAGNOSIS — R0683 Snoring: Secondary | ICD-10-CM

## 2022-05-17 DIAGNOSIS — R001 Bradycardia, unspecified: Secondary | ICD-10-CM

## 2022-05-17 DIAGNOSIS — G4734 Idiopathic sleep related nonobstructive alveolar hypoventilation: Secondary | ICD-10-CM

## 2022-05-17 DIAGNOSIS — G4733 Obstructive sleep apnea (adult) (pediatric): Secondary | ICD-10-CM

## 2022-05-17 DIAGNOSIS — G478 Other sleep disorders: Secondary | ICD-10-CM

## 2022-05-19 ENCOUNTER — Telehealth: Payer: Self-pay | Admitting: Neurology

## 2022-05-19 NOTE — Telephone Encounter (Signed)
Patient left a voicemail on my phone stating she keeps getting calls from [facility] about equipment and she is not sure if our office referred her to them.

## 2022-05-19 NOTE — Telephone Encounter (Signed)
I spoke to patient, advised her to let us further discuss with Dr Brett Fairy and we would let her know recommendations.

## 2022-05-19 NOTE — Telephone Encounter (Signed)
According to phone note with Judson Roch and Dr Dohmeier from 2/1 appears that the patient was having to wait a while before getting in for the cpap titration. Given the A fib history they discussed going ahead and starting on auto cpap and mentioned cancelling the cpap titration study but it appears the titration study that was already scheduled was never cancelled. Patient completed the titration study on 05/17/2022. Dr Dohmeier has not yet reviewed the titration study. At this point, we can hold off on adapt completing the order until Dr Dohmeier reviews the titration study.

## 2022-05-24 ENCOUNTER — Encounter: Payer: Self-pay | Admitting: Neurology

## 2022-05-24 NOTE — Addendum Note (Signed)
Addended by: Larey Seat on: 05/24/2022 10:39 AM   Modules accepted: Orders

## 2022-05-24 NOTE — Progress Notes (Signed)
Mild OSA with bradycardia and atrial fib-  not a successful titration by sleep efficiency.  I like for the patient to give CPAP a 60 day trial.
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
## 2022-06-01 DIAGNOSIS — G4733 Obstructive sleep apnea (adult) (pediatric): Secondary | ICD-10-CM | POA: Diagnosis not present

## 2022-06-08 ENCOUNTER — Other Ambulatory Visit: Payer: Self-pay | Admitting: Obstetrics and Gynecology

## 2022-06-08 DIAGNOSIS — Z1231 Encounter for screening mammogram for malignant neoplasm of breast: Secondary | ICD-10-CM

## 2022-06-14 ENCOUNTER — Telehealth: Payer: Self-pay | Admitting: Family Medicine

## 2022-06-14 NOTE — Telephone Encounter (Signed)
Copied from [place] [phone]. Topic: Medicare AWV >> Jun 14, 2022 11:17 AM Gillis Santa wrote: Reason for CRM: Called patient to schedule Medicare Annual Wellness Visit (AWV). Left message for patient to call back and schedule Medicare Annual Wellness Visit (AWV).  Last date of AWV: 06/02/2021  Please schedule an appointment at any time with Otila Kluver, [facility].  Please schedule for televisit only with health coach Lucile Crater Wed only.  If any questions, please contact me at [phone].  Thank you ,  Shaune Pollack [facility] AWV TEAM Direct Dial [phone]

## 2022-06-20 ENCOUNTER — Other Ambulatory Visit: Payer: Self-pay | Admitting: Family Medicine

## 2022-06-21 MED ORDER — TRAZODONE HCL 50 MG PO TABS
25.0000 mg | ORAL_TABLET | Freq: Every evening | ORAL | 0 refills | Status: DC | PRN
Start: 2022-06-21 — End: 2022-07-06

## 2022-06-21 NOTE — Addendum Note (Signed)
Addended by: Howard Pouch A on: 06/21/2022 03:17 PM   Modules accepted: Orders

## 2022-06-21 NOTE — Telephone Encounter (Signed)
Refill request for Trazodone. Next OV is 07/06/22

## 2022-06-21 NOTE — Telephone Encounter (Signed)
Please check with patient to see if she needs a bridge of trazodone until her appointment. If she will run out before her appointment may fill prescription for her with no refills.   If she has enough we will address during her visit.  Thanks

## 2022-06-21 NOTE — Telephone Encounter (Signed)
Pt states she will be out of the medication on Wed

## 2022-07-02 DIAGNOSIS — G4733 Obstructive sleep apnea (adult) (pediatric): Secondary | ICD-10-CM | POA: Diagnosis not present

## 2022-07-06 ENCOUNTER — Encounter: Payer: Self-pay | Admitting: Family Medicine

## 2022-07-06 ENCOUNTER — Ambulatory Visit (INDEPENDENT_AMBULATORY_CARE_PROVIDER_SITE_OTHER): Payer: Medicare PPO | Admitting: Family Medicine

## 2022-07-06 VITALS — BP 114/74 | HR 57 | Temp 98.3°F | Ht 65.0 in | Wt 125.8 lb

## 2022-07-06 DIAGNOSIS — E782 Mixed hyperlipidemia: Secondary | ICD-10-CM

## 2022-07-06 DIAGNOSIS — I2510 Atherosclerotic heart disease of native coronary artery without angina pectoris: Secondary | ICD-10-CM | POA: Diagnosis not present

## 2022-07-06 DIAGNOSIS — I10 Essential (primary) hypertension: Secondary | ICD-10-CM

## 2022-07-06 DIAGNOSIS — Z79890 Hormone replacement therapy (postmenopausal): Secondary | ICD-10-CM

## 2022-07-06 DIAGNOSIS — Z0000 Encounter for general adult medical examination without abnormal findings: Secondary | ICD-10-CM | POA: Diagnosis not present

## 2022-07-06 DIAGNOSIS — Z1211 Encounter for screening for malignant neoplasm of colon: Secondary | ICD-10-CM

## 2022-07-06 DIAGNOSIS — E034 Atrophy of thyroid (acquired): Secondary | ICD-10-CM

## 2022-07-06 DIAGNOSIS — G479 Sleep disorder, unspecified: Secondary | ICD-10-CM

## 2022-07-06 DIAGNOSIS — Z8673 Personal history of transient ischemic attack (TIA), and cerebral infarction without residual deficits: Secondary | ICD-10-CM | POA: Insufficient documentation

## 2022-07-06 DIAGNOSIS — I700 Atherosclerosis of aorta: Secondary | ICD-10-CM

## 2022-07-06 DIAGNOSIS — J479 Bronchiectasis, uncomplicated: Secondary | ICD-10-CM

## 2022-07-06 DIAGNOSIS — I4891 Unspecified atrial fibrillation: Secondary | ICD-10-CM | POA: Insufficient documentation

## 2022-07-06 DIAGNOSIS — I639 Cerebral infarction, unspecified: Secondary | ICD-10-CM | POA: Insufficient documentation

## 2022-07-06 DIAGNOSIS — Z131 Encounter for screening for diabetes mellitus: Secondary | ICD-10-CM | POA: Diagnosis not present

## 2022-07-06 DIAGNOSIS — I4729 Other ventricular tachycardia: Secondary | ICD-10-CM

## 2022-07-06 DIAGNOSIS — Z1231 Encounter for screening mammogram for malignant neoplasm of breast: Secondary | ICD-10-CM

## 2022-07-06 LAB — CBC WITH DIFFERENTIAL/PLATELET
Basophils Absolute: 0.1 10*3/uL (ref 0.0–0.1)
Basophils Relative: 1 % (ref 0.0–3.0)
Eosinophils Absolute: 0.2 10*3/uL (ref 0.0–0.7)
Eosinophils Relative: 2.8 % (ref 0.0–5.0)
HCT: 40.9 % (ref 36.0–46.0)
Hemoglobin: 13.8 g/dL (ref 12.0–15.0)
Lymphocytes Relative: 26.4 % (ref 12.0–46.0)
Lymphs Abs: 1.7 10*3/uL (ref 0.7–4.0)
MCHC: 33.8 g/dL (ref 30.0–36.0)
MCV: 87.3 fl (ref 78.0–100.0)
Monocytes Absolute: 0.5 10*3/uL (ref 0.1–1.0)
Monocytes Relative: 7.2 % (ref 3.0–12.0)
Neutro Abs: 4.1 10*3/uL (ref 1.4–7.7)
Neutrophils Relative %: 62.6 % (ref 43.0–77.0)
Platelets: 259 10*3/uL (ref 150.0–400.0)
RBC: 4.68 Mil/uL (ref 3.87–5.11)
RDW: 13.8 % (ref 11.5–15.5)
WBC: 6.5 10*3/uL (ref 4.0–10.5)

## 2022-07-06 LAB — COMPREHENSIVE METABOLIC PANEL
ALT: 17 U/L (ref 0–35)
AST: 20 U/L (ref 0–37)
Albumin: 4.4 g/dL (ref 3.5–5.2)
Alkaline Phosphatase: 85 U/L (ref 39–117)
BUN: 15 mg/dL (ref 6–23)
CO2: 28 mEq/L (ref 19–32)
Calcium: 9.4 mg/dL (ref 8.4–10.5)
Chloride: 104 mEq/L (ref 96–112)
Creatinine, Ser: 0.93 mg/dL (ref 0.40–1.20)
GFR: 60.04 mL/min (ref >60.00)
Glucose, Bld: 86 mg/dL (ref 70–99)
Potassium: 4.4 mEq/L (ref 3.5–5.1)
Sodium: 142 mEq/L (ref 135–145)
Total Bilirubin: 0.7 mg/dL (ref 0.2–1.2)
Total Protein: 6.1 g/dL (ref 6.0–8.3)

## 2022-07-06 LAB — LIPID PANEL
Cholesterol: 143 mg/dL (ref 0–200)
HDL: 63.3 mg/dL (ref >39.00)
LDL Cholesterol: 61 mg/dL (ref 0–99)
NonHDL: 79.71
Total CHOL/HDL Ratio: 2
Triglycerides: 96 mg/dL (ref 0.0–149.0)
VLDL: 19.2 mg/dL (ref 0.0–40.0)

## 2022-07-06 LAB — HEMOGLOBIN A1C: Hgb A1c MFr Bld: 5.6 % (ref 4.6–6.5)

## 2022-07-06 LAB — TSH: TSH: 1.99 u[IU]/mL (ref 0.35–5.50)

## 2022-07-06 MED ORDER — LOSARTAN POTASSIUM 25 MG PO TABS: 12.5000 mg | ORAL_TABLET | Freq: Every day | ORAL | 2 refills | Status: DC

## 2022-07-06 MED ORDER — TRAZODONE HCL 50 MG PO TABS: 25.0000 mg | ORAL_TABLET | Freq: Every evening | ORAL | 1 refills | Status: DC | PRN

## 2022-07-06 MED ORDER — ATORVASTATIN CALCIUM 40 MG PO TABS
40.0000 mg | ORAL_TABLET | Freq: Every day | ORAL | 3 refills | Status: DC
Start: 2022-07-06 — End: 2022-12-23

## 2022-07-06 MED ORDER — AMLODIPINE BESYLATE 5 MG PO TABS: 5.0000 mg | ORAL_TABLET | Freq: Every day | ORAL | 2 refills | Status: DC

## 2022-07-06 NOTE — Progress Notes (Signed)
Patient ID: Yvette Short, female  DOB: [birth year], 77 y.o.   MRN: [id number] Patient Care Team    Relationship Specialty Notifications Start End  Natalia Leatherwood, DO PCP - General Family Medicine  08/03/17   Thomasene Ripple, DO PCP - Cardiology Cardiology  11/25/21   Marcelle Overlie, MD Consulting Physician Obstetrics and Gynecology  08/03/17   Nyoka Cowden, MD Consulting Physician Pulmonary Disease  08/03/17   Judi Saa, DO Consulting Physician Sports Medicine  08/03/17   Willa Rough, MD Referring Physician Gastroenterology  08/03/17   Blima Ledger, OD  Optometry  08/03/17   Thomasene Ripple, DO Consulting Physician Cardiology  06/30/21     Chief Complaint  Patient presents with   Annual Exam    [facility]; pt is fasting    Subjective:  Yvette Short is a 77 y.o.  Female  present for CPE and Chronic Conditions/illness Management  All past medical history, surgical history, allergies, family history, immunizations, medications and social history were updated in the electronic medical record today. All recent labs, ED visits and hospitalizations within the last year were reviewed.  Health maintenance:  Colonoscopy: completed 2019, by [facility], resutls polyps found. follow up at age 68- we discussed cologuard vs colonoscopy.  Mammogram: completed:06/2021, Dr. Vincente Poli - has scheduled this month Cervical cancer screening: n/i Immunizations: tdap 2018, Influenza utd 2023 (encouraged yearly), PNA series completed, zostavax completed shingrix, covid completed Infectious disease screening:n/i DEXA: UTD 2018 (gyn) Assistive device: none Oxygen XLK:GMWN Patient has a Dental home. Hospitalizations/ED visits: reviewed  HTN/a.fib/Mixed hyperlipidemia/aortic atherosclerosis/AR/MR/CAD-h/o stroke Pt reports compliance  with losartan12.5 mg qd, amlodipine 5 mg qd, eliquis, lipitor and sectral Patient denies chest pain, shortness of breath, dizziness or lower extremity edema.   Established with  cardio. RF: [id number] CAD.stroke 08/30/2020-echo: Moderate mitral regurgitation, mild to moderate aortic regurgitation, mild to moderate tricuspid regurgitation. The right-sided pressures - mildly elevated.   Hypothyroidism, unspecified type Originally managed by gynecology.  Compliant with levo 25 mcg qd. Labs UTD       07/06/2022    8:20 AM 06/02/2021    1:16 PM 01/07/2021    8:38 AM 06/26/2020    8:48 AM 05/07/2020    9:10 AM  Depression screen PHQ 2/9  Decreased Interest 0 0 0 0 0  Down, Depressed, Hopeless 0 0 0 1 0  PHQ - 2 Score 0 0 0 1 0  Altered sleeping   0 1   Tired, decreased energy   0 1   Change in appetite   0 2   Feeling bad or failure about yourself    0 1   Trouble concentrating   0 0   Moving slowly or fidgety/restless   0 0   Suicidal thoughts   0 0   PHQ-9 Score   0 6       06/26/2020    8:48 AM 12/25/2019    8:46 AM  GAD 7 : Generalized Anxiety Score  Nervous, Anxious, on Edge 1 0  Control/stop worrying 0 0  Worry too much - different things 0 0  Trouble relaxing 0 0  Restless 0 0  Easily annoyed or irritable 1 1  Afraid - awful might happen 0 0  Total GAD 7 Score 2 1    Immunization History  Administered Date(s) Administered   Fluad Quad(high Dose 65+) 12/25/2019, 11/30/2021   Influenza Split 12/21/2010, 12/21/2011   Influenza Whole 01/04/2008, 12/31/2008, 01/14/2010   Influenza, High  Dose Seasonal PF 01/03/2013, 12/26/2013, 02/12/2016, 12/28/2016, 12/05/2020   Influenza-Unspecified 12/06/2017   PFIZER Comirnaty(Gray Top)Covid-19 Tri-Sucrose Vaccine 12/23/2021   PFIZER(Purple Top)SARS-COV-2 Vaccination 04/22/2019, 05/14/2019, 01/25/2020   Pfizer Covid-19 Vaccine Bivalent Booster 1292yrs & up 12/05/2020   Pneumococcal Conjugate-13 02/16/2016   Pneumococcal Polysaccharide-23 04/10/2012   Respiratory Syncytial Virus Vaccine,Recomb Aduvanted(Arexvy) 12/23/2021   Td 03/09/2001, 12/10/2008   Tdap 12/10/2008, 12/28/2016   Zoster Recombinat (Shingrix)  08/03/2017, 12/29/2017, 12/29/2017   Zoster, Live 12/21/2010     Past Medical History:  Diagnosis Date   Abnormal colonoscopy 03/2017   decreased rectal tone and polyp; 5 year recall   Abnormal findings on esophagogastroduodenoscopy (EGD) 02/25/2016   Z-line irrg. at 40 cm. No abnomrality of the esophagus to explain dysphagia. Esophagus dilated. H/o + H. Pylori   Allergy    ANXIETY 07/06/2007   Aortic atherosclerosis 02/08/2018   Aortic valve regurgitation    Atherosclerosis of native coronary artery of native heart without angina pectoris 02/08/2018   Biceps tendonitis on right    Bronchiectasis without complication 03/16/2011   CT 2012 IMPRESSION:  1. Apical scarring may account for the plain film abnormality.  2. A 6 mm right upper lobe pulmonary nodule.  - - HRCT 07/12/2017 >>>  Scattered minimal cylindrical and varicoid bronchiectasis with associated scattered mucoid impaction and minimal tree-in-bud opacity in both lungs, predominantly in the mid to lower lungs. Findings are stable to slightly worsened since 2015 ches   Chicken pox    Chronic cough 05/18/2017   CT chest 03/19/14  No bronchiectasis (not hrct)  Spirometry 05/18/2017  FEV1  2.15 (93%)  Ratio 76 s rx prior  - FENO 05/18/2017  =   Could not perform   - Allergy profile 05/18/2017 >  Eos 0.2 /  IgE  75 RAST pos cat > dog > mold - HRCT 07/12/2017 >>>  See bronchiectasis - trial of dymista 07/26/2017 > some better but not consistent with it > rechallenge rx one puff each am 10/25/2017  - MCT 11/02/17 >     Colon polyp 03/31/2017   hyperplastic   COVID-19 08/04/2020   mild   Diverticulosis 03/2017   sigmoid   Family history of colon cancer 04/03/2017   Formatting of this note might be different from the original. 1/19 colon - no adenomas Recommend screening exam in 1/24 - [facility]   Family history of ovarian cancer    Genetic testing 03/26/2016   Negative genetic testing on the [facility] 30 gene panel.  The 30-Gene Cancer Panel  offered by [facility] includes sequencing and/or deletion duplication testing of the following 30 genes: APC, ATM, BAP1, BARD1, BMPR1A, BRCA1, BRCA2, BRIP1, CDH1, CDK4, CDKN2A (p14ARF), CDKN2A (p16INK4a), CHEK2, EPCAM, GREM1, MITF, MLH1, MSH2, MSH6, MUTYH, NBN, PALB2, PMS2, POLD1, POLE, PTEN, RAD51C, RAD51D, SMAD4,    GERD 11/09/2006   H. pylori infection    h/o treated wiht prevpac   Hormone replacement therapy 01/08/2019   HYPERLIPIDEMIA 11/09/2006   Hypertension    Hypothyroidism 08/03/2017   Lumbar radiculopathy 07/24/2014   Migraine    Mild depression 02/14/2019   Pharyngoesophageal dysphagia 08/03/2017   Piriformis syndrome of right side    Spinal stenosis, lumbar region, with neurogenic claudication 10/16/2014   Epidural 10/02/2014, December 2016    Stomach ulcer 2008   Allergies  Allergen Reactions   Tetracycline Hcl Rash        Past Surgical History:  Procedure Laterality Date   CATARACT EXTRACTION Bilateral 04/2021   CERVICAL POLYPECTOMY  01/17/2019   LIPOMA EXCISION     of back   TUBAL LIGATION     Family History  Problem Relation Age of Onset   Dementia Mother    Hyperlipidemia Mother    Colon cancer Father 40   Colon polyps Brother        gets colonoscopy every 2-3 years   Hyperlipidemia Brother    Hypertension Brother    Uterine cancer Maternal Aunt        dx in her 66s   Colon cancer Maternal Uncle    Colon cancer Paternal Aunt    Heart attack Maternal Grandfather    Cancer Other        MGMs sister - "abdominal cancer"   Breast cancer Cousin    Social History   Social History Narrative   Married. Two children.    College grad.    Former smoker.    Exercises routinely.    Drink caffeine.    Wears a hearing aid.    Smoke alarm in the home.    Wears her seatbelt.    Feels safe in her relationships.     Allergies as of 07/06/2022       Reactions   Tetracycline Hcl Rash            Medication List        Accurate as of July 06, 2022 10:35 AM. If you have any questions, ask your nurse or doctor.          STOP taking these medications    ALPRAZolam 0.5 MG tablet Commonly known as: XANAX Stopped by: Felix Pacini, DO       TAKE these medications    acebutolol 200 MG capsule Commonly known as: SECTRAL TAKE 1 CAPSULE BY MOUTH TWICE A DAY   amLODipine 5 MG tablet Commonly known as: NORVASC Take 1 tablet (5 mg total) by mouth daily.   apixaban 5 MG Tabs tablet Commonly known as: ELIQUIS Take 1 tablet (5 mg total) by mouth 2 (two) times daily.   atorvastatin 40 MG tablet Commonly known as: LIPITOR Take 1 tablet (40 mg total) by mouth daily.   Azelastine HCl 137 MCG/SPRAY Soln Place into both nostrils.   chlorpheniramine 4 MG tablet Commonly known as: CHLOR-TRIMETON Take 4 mg by mouth every 4 (four) hours as needed for allergies.   DIALYVITE VITAMIN D 5000 PO Take by mouth.   estradiol 0.0375 MG/24HR Commonly known as: VIVELLE-DOT Place 1 patch onto the skin 2 (two) times a week.   gabapentin 300 MG capsule Commonly known as: NEURONTIN Take 300 mg by mouth. 3-4 times per day   levothyroxine 88 MCG tablet Commonly known as: SYNTHROID Take 1 tablet (88 mcg total) by mouth daily.   losartan 25 MG tablet Commonly known as: COZAAR Take 0.5 tablets (12.5 mg total) by mouth daily.   MAGNESIUM PO Take by mouth.   progesterone 100 MG capsule Commonly known as: PROMETRIUM Take 100 mg by mouth in the morning and at bedtime.   traZODone 50 MG tablet Commonly known as: DESYREL Take 0.5-1 tablets (25-50 mg total) by mouth at bedtime as needed for sleep. Take half tablet by mouth as needed at bedtime What changed: how much to take Changed by: Felix Pacini, DO   VITAMIN K PO Take by mouth.        All past medical history, surgical history, allergies, family history, immunizations andmedications were updated in the EMR today and reviewed under the history  and medication portions of their  EMR.     Recent Results (from the past 2160 hour(s))  Creatinine, Serum     Status: None   Collection Time: 04/15/22  2:14 PM  Result Value Ref Range   Creatinine, Ser 0.97 0.57 - 1.00 mg/dL   eGFR 61 >62 ZH/YQM/5.78      ROS 14 pt review of systems performed and negative (unless mentioned in an HPI)  Objective: BP 114/74   Pulse (!) 57   Temp 98.3 F (36.8 C)   Ht 5\' 5"  (1.651 m)   Wt 125 lb 12.8 oz (57.1 kg)   SpO2 100%   BMI 20.93 kg/m  Physical Exam Vitals and nursing note reviewed.  Constitutional:      General: She is not in acute distress.    Appearance: Normal appearance. She is not ill-appearing or toxic-appearing.  HENT:     Head: Normocephalic and atraumatic.     Right Ear: Tympanic membrane, ear canal and external ear normal. There is no impacted cerumen.     Left Ear: Tympanic membrane, ear canal and external ear normal. There is no impacted cerumen.     Nose: No congestion or rhinorrhea.     Mouth/Throat:     Mouth: Mucous membranes are moist.     Pharynx: Oropharynx is clear. No oropharyngeal exudate or posterior oropharyngeal erythema.  Eyes:     General: No scleral icterus.       Right eye: No discharge.        Left eye: No discharge.     Extraocular Movements: Extraocular movements intact.     Conjunctiva/sclera: Conjunctivae normal.     Pupils: Pupils are equal, round, and reactive to light.  Cardiovascular:     Rate and Rhythm: Normal rate and regular rhythm.     Pulses: Normal pulses.     Heart sounds: Normal heart sounds. No murmur heard.    No friction rub. No gallop.  Pulmonary:     Effort: Pulmonary effort is normal. No respiratory distress.     Breath sounds: Normal breath sounds. No stridor. No wheezing, rhonchi or rales.  Chest:     Chest wall: No tenderness.  Abdominal:     General: Abdomen is flat. Bowel sounds are normal. There is no distension.     Palpations: Abdomen is soft. There is no mass.     Tenderness: There is no  abdominal tenderness. There is no right CVA tenderness, left CVA tenderness, guarding or rebound.     Hernia: No hernia is present.  Musculoskeletal:        General: No swelling, tenderness or deformity. Normal range of motion.     Cervical back: Normal range of motion and neck supple. No rigidity or tenderness.     Right lower leg: No edema.     Left lower leg: No edema.  Lymphadenopathy:     Cervical: No cervical adenopathy.  Skin:    General: Skin is warm and dry.     Coloration: Skin is not jaundiced or pale.     Findings: No bruising, erythema, lesion or rash.  Neurological:     General: No focal deficit present.     Mental Status: She is alert and oriented to person, place, and time. Mental status is at baseline.     Cranial Nerves: No cranial nerve deficit.     Sensory: No sensory deficit.     Motor: No weakness.     Coordination: Coordination normal.  Gait: Gait normal.     Deep Tendon Reflexes: Reflexes normal.  Psychiatric:        Mood and Affect: Mood normal.        Behavior: Behavior normal.        Thought Content: Thought content normal.        Judgment: Judgment normal.      No results found.  Assessment/plan: SAMARIYAH MARTINS is a 77 y.o. female present for CPE and Chronic Conditions/illness Management HTN/a.fib/Mixed hyperlipidemia/aortic atherosclerosis/AR/MR/CAD-h/o stroke Stable Continue  amlodipine 5 mg daily Continue  losartan 12.5 mg daily Continue  sectral and eliquis , lipitor prescribed by cardiology.  Labs due next visit. F/u 5.5 mos cmc  Sleep disorder: Sleeping is an issue. Struggling with cpap.  Increase trazodone 25-50 mg qd.   OSA: Compliant with cpap  Hypothyroidism due to acquired atrophy of thyroid Continue levothyroxine 88 mcg daily Labs due next visit.   Routine general medical examination at a health care facility Patient was encouraged to exercise greater than 150 minutes a week. Patient was encouraged to choose a diet  filled with fresh fruits and vegetables, and lean meats. AVS provided to patient today for education/recommendation on gender specific health and safety maintenance. Colonoscopy: completed 2019, by [facility], resutls polyps found. follow up at age 44- we discussed cologuard vs colonoscopy.  Mammogram: completed:06/2021, Dr. Vincente Poli - has scheduled this month Cervical cancer screening: n/i Immunizations: tdap 2018, Influenza utd 2023 (encouraged yearly), PNA series completed, zostavax completed shingrix, covid completed Infectious disease screening:n/i DEXA: UTD 2018 (gyn)  Return in about 24 weeks (around 12/21/2022) for Routine chronic condition follow-up.   Orders Placed This Encounter  Procedures   CBC with Differential/Platelet   Comprehensive metabolic panel   Hemoglobin A1c   TSH   Lipid panel   Meds ordered this encounter  Medications   losartan (COZAAR) 25 MG tablet    Sig: Take 0.5 tablets (12.5 mg total) by mouth daily.    Dispense:  45 tablet    Refill:  2   amLODipine (NORVASC) 5 MG tablet    Sig: Take 1 tablet (5 mg total) by mouth daily.    Dispense:  90 tablet    Refill:  2   atorvastatin (LIPITOR) 40 MG tablet    Sig: Take 1 tablet (40 mg total) by mouth daily.    Dispense:  90 tablet    Refill:  3   traZODone (DESYREL) 50 MG tablet    Sig: Take 0.5-1 tablets (25-50 mg total) by mouth at bedtime as needed for sleep. Take half tablet by mouth as needed at bedtime    Dispense:  90 tablet    Refill:  1   Referral Orders  No referral(s) requested today     Electronically signed by: Felix Pacini, DO [facility] [place]

## 2022-07-07 ENCOUNTER — Other Ambulatory Visit: Payer: Self-pay | Admitting: Family Medicine

## 2022-07-07 MED ORDER — LEVOTHYROXINE SODIUM 88 MCG PO TABS
88.0000 ug | ORAL_TABLET | Freq: Every day | ORAL | 3 refills | Status: DC
Start: 2022-07-07 — End: 2023-07-14

## 2022-07-08 ENCOUNTER — Ambulatory Visit: Payer: Medicare PPO | Attending: Cardiology | Admitting: Cardiology

## 2022-07-08 ENCOUNTER — Encounter: Payer: Self-pay | Admitting: Cardiology

## 2022-07-08 VITALS — BP 128/70 | HR 68 | Ht 65.0 in | Wt 124.2 lb

## 2022-07-08 DIAGNOSIS — I491 Atrial premature depolarization: Secondary | ICD-10-CM

## 2022-07-08 DIAGNOSIS — G4733 Obstructive sleep apnea (adult) (pediatric): Secondary | ICD-10-CM

## 2022-07-08 DIAGNOSIS — I10 Essential (primary) hypertension: Secondary | ICD-10-CM

## 2022-07-08 DIAGNOSIS — I4891 Unspecified atrial fibrillation: Secondary | ICD-10-CM

## 2022-07-08 DIAGNOSIS — I4729 Other ventricular tachycardia: Secondary | ICD-10-CM

## 2022-07-08 DIAGNOSIS — I351 Nonrheumatic aortic (valve) insufficiency: Secondary | ICD-10-CM | POA: Diagnosis not present

## 2022-07-08 DIAGNOSIS — E782 Mixed hyperlipidemia: Secondary | ICD-10-CM

## 2022-07-08 NOTE — Patient Instructions (Signed)
Medication Instructions:  Your physician recommends that you continue on your current medications as directed. Please refer to the Current Medication list given to you today.  *If you need a refill on your cardiac medications before your next appointment, please call your pharmacy*   Lab Work: None   Testing/Procedures: None   Follow-Up: At [facility], you and your health needs are our priority.  As part of our continuing mission to provide you with exceptional heart care, we have created designated Provider Care Teams.  These Care Teams include your primary Cardiologist (physician) and Advanced Practice Providers (APPs -  Physician Assistants and Nurse Practitioners) who all work together to provide you with the care you need, when you need it.   Your next appointment:   9 month(s)  Provider:   Kardie Tobb, DO   

## 2022-07-09 NOTE — Progress Notes (Unsigned)
Cardiology Office Note:    Date:  07/10/2022   ID:  Yvette Short, DOB 07-Oct-1945, MRN [id number]  PCP:  Yvette Leatherwood, DO  Cardiologist:  Yvette Ripple, DO  Electrophysiologist:  None   Referring MD: Yvette Leatherwood, DO   " I am ok"   History of Present Illness:    Yvette Short is a 77 y.o. female with a hx of  hypertension, hyperlipidemia, hypothyroidism, moderate coronary artery disease by coronary CTA, frequent PACs and PVCs, moderate mitral regurgitation, moderate aortic regurgitation presents today for follow-up visit.   I  saw the patient on September 04, 2019 at that time we discussed her monitor results which showed frequent PACs as well as PVCs.  I did start the patient on acebutolol 200 mg twice daily. That day she also reported that she was experiencing chest pain we ordered coronary CTA which she was able to get in the interim showed moderate coronary artery disease.   I did see the patient in July 2021 at that time she was reporting significant fatigue and her blood pressure was running between 90 systolic to 105 systolic.  I did not stop her amlodipine.  In addition giving her CTA showing coronary artery disease I increased her Lipitor to 40 mg daily.   I saw the patient in October 2021 at that time we will decrease her acebutolol to 200 mg daily.     I saw the patient on June 03, 2020 at that time she appeared to be orthostatic positive.  Encouraged the patient reduce abdominal binder to help with this.  She also did have some dizziness I placed a monitor on the patient, and intermittent her monitor that showed that she had improvement with her PACs.     I saw the patient on September 08, 2020 at that time she was stable from a CV standpoint.  We repeated her echocardiogram to assess her moderate mitral regurgitation.     Since I saw the patient she has seen Anice Paganini NP, monitors placed on the patient which show evidence of paroxysmal atrial fibrillation.  She has  since been started on Eliquis.  She had a lot of questions today about the A-fib management of A-fib in excess.  She offers no significant complaints at this time.  Past Medical History:  Diagnosis Date   Abnormal colonoscopy 03/2017   decreased rectal tone and polyp; 5 year recall   Abnormal findings on esophagogastroduodenoscopy (EGD) 02/25/2016   Z-line irrg. at 40 cm. No abnomrality of the esophagus to explain dysphagia. Esophagus dilated. H/o + H. Pylori   Allergy    ANXIETY 07/06/2007   Aortic atherosclerosis 02/08/2018   Aortic valve regurgitation    Atherosclerosis of native coronary artery of native heart without angina pectoris 02/08/2018   Biceps tendonitis on right    Bronchiectasis without complication 03/16/2011   CT 2012 IMPRESSION:  1. Apical scarring may account for the plain film abnormality.  2. A 6 mm right upper lobe pulmonary nodule.  - - HRCT 07/12/2017 >>>  Scattered minimal cylindrical and varicoid bronchiectasis with associated scattered mucoid impaction and minimal tree-in-bud opacity in both lungs, predominantly in the mid to lower lungs. Findings are stable to slightly worsened since 2015 ches   Chicken pox    Chronic cough 05/18/2017   CT chest 03/19/14  No bronchiectasis (not hrct)  Spirometry 05/18/2017  FEV1  2.15 (93%)  Ratio 76 s rx prior  - FENO 05/18/2017  =  Could not perform   - Allergy profile 05/18/2017 >  Eos 0.2 /  IgE  75 RAST pos cat > dog > mold - HRCT 07/12/2017 >>>  See bronchiectasis - trial of dymista 07/26/2017 > some better but not consistent with it > rechallenge rx one puff each am 10/25/2017  - MCT 11/02/17 >     Colon polyp 03/31/2017   hyperplastic   COVID-19 08/04/2020   mild   Diverticulosis 03/2017   sigmoid   Family history of colon cancer 04/03/2017   Formatting of this note might be different from the original. 1/19 colon - no adenomas Recommend screening exam in 1/24 - [facility]   Family history of ovarian cancer    Genetic testing  03/26/2016   Negative genetic testing on the [facility] 30 gene panel.  The 30-Gene Cancer Panel offered by [facility] includes sequencing and/or deletion duplication testing of the following 30 genes: APC, ATM, BAP1, BARD1, BMPR1A, BRCA1, BRCA2, BRIP1, CDH1, CDK4, CDKN2A (p14ARF), CDKN2A (p16INK4a), CHEK2, EPCAM, GREM1, MITF, MLH1, MSH2, MSH6, MUTYH, NBN, PALB2, PMS2, POLD1, POLE, PTEN, RAD51C, RAD51D, SMAD4,    GERD 11/09/2006   H. pylori infection    h/o treated wiht prevpac   Hormone replacement therapy 01/08/2019   HYPERLIPIDEMIA 11/09/2006   Hypertension    Hypothyroidism 08/03/2017   Lumbar radiculopathy 07/24/2014   Migraine    Mild depression 02/14/2019   Pharyngoesophageal dysphagia 08/03/2017   Piriformis syndrome of right side    Spinal stenosis, lumbar region, with neurogenic claudication 10/16/2014   Epidural 10/02/2014, December 2016    Stomach ulcer 2008    Past Surgical History:  Procedure Laterality Date   CATARACT EXTRACTION Bilateral 04/2021   CERVICAL POLYPECTOMY  01/17/2019   LIPOMA EXCISION     of back   TUBAL LIGATION      Current Medications: Current Meds  Medication Sig   acebutolol (SECTRAL) 200 MG capsule TAKE 1 CAPSULE BY MOUTH TWICE A DAY   amLODipine (NORVASC) 5 MG tablet Take 1 tablet (5 mg total) by mouth daily.   apixaban (ELIQUIS) 5 MG TABS tablet Take 1 tablet (5 mg total) by mouth 2 (two) times daily.   atorvastatin (LIPITOR) 40 MG tablet Take 1 tablet (40 mg total) by mouth daily.   Azelastine HCl 137 MCG/SPRAY SOLN Place into both nostrils.   chlorpheniramine (CHLOR-TRIMETON) 4 MG tablet Take 4 mg by mouth every 4 (four) hours as needed for allergies.   Cholecalciferol (DIALYVITE VITAMIN D 5000 PO) Take by mouth.   estradiol (VIVELLE-DOT) 0.0375 MG/24HR Place 1 patch onto the skin 2 (two) times a week.   gabapentin (NEURONTIN) 300 MG capsule Take 300 mg by mouth. 3-4 times per day   levothyroxine (SYNTHROID) 88 MCG tablet Take 1 tablet (88  mcg total) by mouth daily.   losartan (COZAAR) 25 MG tablet Take 0.5 tablets (12.5 mg total) by mouth daily.   MAGNESIUM PO Take by mouth.   progesterone (PROMETRIUM) 100 MG capsule Take 100 mg by mouth in the morning and at bedtime. TAKING DAILY   traZODone (DESYREL) 50 MG tablet Take 0.5-1 tablets (25-50 mg total) by mouth at bedtime as needed for sleep. Take half tablet by mouth as needed at bedtime     Allergies:   Tetracycline hcl   Social History   Socioeconomic History   Marital status: Married    Spouse name: Not on file   Number of children: Not on file   Years of education: Not on file  Highest education level: Not on file  Occupational History   Not on file  Tobacco Use   Smoking status: Former    Packs/day: 2.50    Years: 13.00    Additional pack years: 0.00    Total pack years: 32.50    Types: Cigarettes    Quit date: 07/28/1974    Years since quitting: 47.9   Smokeless tobacco: Never  Vaping Use   Vaping Use: Never used  Substance and Sexual Activity   Alcohol use: Yes    Alcohol/week: 2.0 standard drinks of alcohol    Types: 2 Cans of beer per week   Drug use: No   Sexual activity: Yes    Partners: Male  Other Topics Concern   Not on file  Social History Narrative   Married. Two children.    College grad.    Former smoker.    Exercises routinely.    Drink caffeine.    Wears a hearing aid.    Smoke alarm in the home.    Wears her seatbelt.    Feels safe in her relationships.    Social Determinants of Health   Financial Resource Strain: Low Risk  (06/02/2021)   Overall Financial Resource Strain (CARDIA)    Difficulty of Paying Living Expenses: Not hard at all  Food Insecurity: No Food Insecurity (06/02/2021)   Hunger Vital Sign    Worried About Running Out of Food in the Last Year: Never true    Ran Out of Food in the Last Year: Never true  Transportation Needs: No Transportation Needs (06/02/2021)   PRAPARE - Scientist, research (physical sciences) (Medical): No    Lack of Transportation (Non-Medical): No  Physical Activity: Sufficiently Active (06/02/2021)   Exercise Vital Sign    Days of Exercise per Week: 3 days    Minutes of Exercise per Session: 60 min  Stress: No Stress Concern Present (06/02/2021)   [employer] of Occupational Health - Occupational Stress Questionnaire    Feeling of Stress : Not at all  Social Connections: Moderately Isolated (06/02/2021)   Social Connection and Isolation Panel [NHANES]    Frequency of Communication with Friends and Family: Once a week    Frequency of Social Gatherings with Friends and Family: Once a week    Attends Religious Services: More than 4 times per year    Active Member of [employer] or Organizations: No    Attends Banker Meetings: Never    Marital Status: Married     Family History: The patient's family history includes Breast cancer in her cousin; Cancer in an other family member; Colon cancer in her maternal uncle and paternal aunt; Colon cancer (age of onset: 1) in her father; Colon polyps in her brother; Dementia in her mother; Heart attack in her maternal grandfather; Hyperlipidemia in her brother and mother; Hypertension in her brother; Uterine cancer in her maternal aunt.  ROS:   Review of Systems  Constitution: Negative for decreased appetite, fever and weight gain.  HENT: Negative for congestion, ear discharge, hoarse voice and sore throat.   Eyes: Negative for discharge, redness, vision loss in right eye and visual halos.  Cardiovascular: Negative for chest pain, dyspnea on exertion, leg swelling, orthopnea and palpitations.  Respiratory: Negative for cough, hemoptysis, shortness of breath and snoring.   Endocrine: Negative for heat intolerance and polyphagia.  Hematologic/Lymphatic: Negative for bleeding problem. Does not bruise/bleed easily.  Skin: Negative for flushing, nail changes, rash and suspicious lesions.  Musculoskeletal: Negative  for arthritis, joint pain, muscle cramps, myalgias, neck pain and stiffness.  Gastrointestinal: Negative for abdominal pain, bowel incontinence, diarrhea and excessive appetite.  Genitourinary: Negative for decreased libido, genital sores and incomplete emptying.  Neurological: Negative for brief paralysis, focal weakness, headaches and loss of balance.  Psychiatric/Behavioral: Negative for altered mental status, depression and suicidal ideas.  Allergic/Immunologic: Negative for HIV exposure and persistent infections.    EKGs/Labs/Other Studies Reviewed:    The following studies were reviewed today:   EKG:  The ekg ordered today demonstrates   Recent Labs: 11/26/2021: Magnesium 2.3 07/06/2022: ALT 17; BUN 15; Creatinine, Ser 0.93; Hemoglobin 13.8; Platelets 259.0; Potassium 4.4; Sodium 142; TSH 1.99  Recent Lipid Panel    Component Value Date/Time   CHOL 143 07/06/2022 0821   TRIG 96.0 07/06/2022 0821   HDL 63.30 07/06/2022 0821   CHOLHDL 2 07/06/2022 0821   VLDL 19.2 07/06/2022 0821   LDLCALC 61 07/06/2022 0821   LDLDIRECT 158.2 05/07/2013 0928    Physical Exam:    VS:  BP 128/70   Pulse 68   Ht 5\' 5"  (1.651 m)   Wt 124 lb 3.2 oz (56.3 kg)   SpO2 93%   BMI 20.67 kg/m     Wt Readings from Last 3 Encounters:  07/08/22 124 lb 3.2 oz (56.3 kg)  07/06/22 125 lb 12.8 oz (57.1 kg)  03/31/22 132 lb (59.9 kg)     GEN: Well nourished, well developed in no acute distress HEENT: Normal NECK: No JVD; No carotid bruits LYMPHATICS: No lymphadenopathy CARDIAC: S1S2 noted,RRR, no murmurs, rubs, gallops RESPIRATORY:  Clear to auscultation without rales, wheezing or rhonchi  ABDOMEN: Soft, non-tender, non-distended, +bowel sounds, no guarding. EXTREMITIES: No edema, No cyanosis, no clubbing MUSCULOSKELETAL:  No deformity  SKIN: Warm and dry NEUROLOGIC:  Alert and oriented x 3, non-focal PSYCHIATRIC:  Normal affect, good insight  ASSESSMENT:    1. Atrial fibrillation,  unspecified type   2. Essential hypertension   3. Non-sustained ventricular tachycardia   4. Frequent PAC (premature atrial contraction)   5. Moderate aortic regurgitation   6. Obstructive sleep apnea on CPAP   7. Mixed hyperlipidemia    PLAN:    She appears to be doing well from a current vascular standpoint we talked about the A-fib rhythm and rate strategies and stroke prevention treatment. Will continue her beta-blocker for now.  However she had questions about the Eliquis which I was able to answer.  Will also refer the patient to EP she is interested in further discussion for antiarrhythmics versus A-fib ablation.  Continue current dose of statin  The patient is in agreement with the above plan. The patient left the office in stable condition.  The patient will follow up in   Medication Adjustments/Labs and Tests Ordered: Current medicines are reviewed at length with the patient today.  Concerns regarding medicines are outlined above.  Orders Placed This Encounter  Procedures   Ambulatory referral to Cardiac Electrophysiology   No orders of the defined types were placed in this encounter.   Patient Instructions  Medication Instructions:  Your physician recommends that you continue on your current medications as directed. Please refer to the Current Medication list given to you today.  *If you need a refill on your cardiac medications before your next appointment, please call your pharmacy*   Lab Work: None   Testing/Procedures: None   Follow-Up: At [facility], you and your health needs are our priority.  As  part of our continuing mission to provide you with exceptional heart care, we have created designated Provider Care Teams.  These Care Teams include your primary Cardiologist (physician) and Advanced Practice Providers (APPs -  Physician Assistants and Nurse Practitioners) who all work together to provide you with the care you need, when you need  it.   Your next appointment:   9 month(s)  Provider:   Thomasene Ripple, DO      Adopting a Healthy Lifestyle.  Know what a healthy weight is for you (roughly BMI <25) and aim to maintain this   Aim for 7+ servings of fruits and vegetables daily   65-80+ fluid ounces of water or unsweet tea for healthy kidneys   Limit to max 1 drink of alcohol per day; avoid smoking/tobacco   Limit animal fats in diet for cholesterol and heart health - choose grass fed whenever available   Avoid highly processed foods, and foods high in saturated/trans fats   Aim for low stress - take time to unwind and care for your mental health   Aim for 150 min of moderate intensity exercise weekly for heart health, and weights twice weekly for bone health   Aim for 7-9 hours of sleep daily   When it comes to diets, agreement about the perfect plan isnt easy to find, even among the experts. Experts at the [facility] [employer] developed an idea known as the Healthy Eating Plate. Just imagine a plate divided into logical, healthy portions.   The emphasis is on diet quality:   Load up on vegetables and fruits - one-half of your plate: Aim for color and variety, and remember that potatoes dont count.   Go for whole grains - one-quarter of your plate: Whole wheat, barley, wheat berries, quinoa, oats, brown rice, and foods made with them. If you want pasta, go with whole wheat pasta.   Protein power - one-quarter of your plate: Fish, chicken, beans, and nuts are all healthy, versatile protein sources. Limit red meat.   The diet, however, does go beyond the plate, offering a few other suggestions.   Use healthy plant oils, such as olive, canola, soy, corn, sunflower and peanut. Check the labels, and avoid partially hydrogenated oil, which have unhealthy trans fats.   If youre thirsty, drink water. Coffee and tea are good in moderation, but skip sugary drinks and limit milk and dairy products to one  or two daily servings.   The type of carbohydrate in the diet is more important than the amount. Some sources of carbohydrates, such as vegetables, fruits, whole grains, and beans-are healthier than others.   Finally, stay active  Signed, Yvette Ripple, DO  07/10/2022 4:45 PM    [facility]

## 2022-07-13 ENCOUNTER — Ambulatory Visit: Payer: Medicare PPO | Admitting: Neurology

## 2022-07-13 ENCOUNTER — Encounter: Payer: Self-pay | Admitting: Neurology

## 2022-07-13 VITALS — BP 119/60 | HR 63 | Ht 65.0 in | Wt 127.5 lb

## 2022-07-13 DIAGNOSIS — G4733 Obstructive sleep apnea (adult) (pediatric): Secondary | ICD-10-CM | POA: Diagnosis not present
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
## 2022-07-13 NOTE — Patient Instructions (Addendum)
I will order a mask refit for comfort  Work on increasing nightly use for > 4 hours  I will pull a download in 5 weeks to recheck on things Keep appointment in July with Dr. Vickey Huger

## 2022-07-14 ENCOUNTER — Telehealth: Payer: Self-pay

## 2022-07-14 NOTE — Telephone Encounter (Signed)
Sent community msg through epic indicating that new cpap orders wee placed

## 2022-07-21 ENCOUNTER — Ambulatory Visit: Payer: Medicare PPO | Admitting: Neurology

## 2022-07-23 ENCOUNTER — Ambulatory Visit
Admission: RE | Admit: 2022-07-23 | Discharge: 2022-07-23 | Disposition: A | Discharge status: Home / Self Care | Payer: Medicare PPO | Source: Ambulatory Visit | Attending: Obstetrics and Gynecology | Admitting: Obstetrics and Gynecology

## 2022-07-23 DIAGNOSIS — Z1231 Encounter for screening mammogram for malignant neoplasm of breast: Secondary | ICD-10-CM

## 2022-08-01 DIAGNOSIS — G4733 Obstructive sleep apnea (adult) (pediatric): Secondary | ICD-10-CM | POA: Diagnosis not present

## 2022-08-03 DIAGNOSIS — L821 Other seborrheic keratosis: Secondary | ICD-10-CM | POA: Diagnosis not present

## 2022-08-03 DIAGNOSIS — D225 Melanocytic nevi of trunk: Secondary | ICD-10-CM | POA: Diagnosis not present

## 2022-08-03 DIAGNOSIS — L218 Other seborrheic dermatitis: Secondary | ICD-10-CM | POA: Diagnosis not present

## 2022-08-03 DIAGNOSIS — L309 Dermatitis, unspecified: Secondary | ICD-10-CM | POA: Diagnosis not present

## 2022-08-04 NOTE — Telephone Encounter (Signed)
Called pt to schedule AWV. Please schedule with health coach or Padme Arriaga.  

## 2022-08-05 ENCOUNTER — Encounter: Payer: Self-pay | Admitting: Family Medicine

## 2022-08-05 DIAGNOSIS — N958 Other specified menopausal and perimenopausal disorders: Secondary | ICD-10-CM | POA: Diagnosis not present

## 2022-08-05 DIAGNOSIS — Z8262 Family history of osteoporosis: Secondary | ICD-10-CM | POA: Diagnosis not present

## 2022-08-05 DIAGNOSIS — E039 Hypothyroidism, unspecified: Secondary | ICD-10-CM | POA: Diagnosis not present

## 2022-08-06 ENCOUNTER — Encounter: Payer: Self-pay | Admitting: Cardiology

## 2022-08-06 ENCOUNTER — Ambulatory Visit: Payer: Medicare PPO | Attending: Cardiology | Admitting: Cardiology

## 2022-08-06 VITALS — BP 114/80 | HR 53 | Ht 65.0 in | Wt 128.0 lb

## 2022-08-06 DIAGNOSIS — D6869 Other thrombophilia: Secondary | ICD-10-CM

## 2022-08-06 DIAGNOSIS — I10 Essential (primary) hypertension: Secondary | ICD-10-CM | POA: Diagnosis not present

## 2022-08-06 DIAGNOSIS — I480 Paroxysmal atrial fibrillation: Secondary | ICD-10-CM

## 2022-08-06 NOTE — Progress Notes (Signed)
Electrophysiology Office Note   Date:  08/06/2022   ID:  SWEDEN HEINSOHN, DOB 1946-01-27, MRN [id number]  PCP:  Natalia Leatherwood, DO  Cardiologist:  Tobb Primary Electrophysiologist:  Regan Lemming, MD    Chief Complaint: AF   History of Present Illness: Yvette Short is a 77 y.o. female who is being seen today for the evaluation of AF at the request of Tobb, Kardie, DO. Presenting today for electrophysiology evaluation.  She has a history significant for hypertension, hyperlipidemia, coronary artery disease, PACs, PVCs, atrial fibrillation.  She wore a cardiac monitor that showed a 1% atrial fibrillation burden.  Today, she denies symptoms of palpitations, chest pain, shortness of breath, orthopnea, PND, lower extremity edema, claudication, dizziness, presyncope, syncope, bleeding, or neurologic sequela. The patient is tolerating medications without difficulties.  She currently feels well.  She does have intermittent palpitations.  She was unaware of episodes of atrial fibrillation.  On a cardiac monitor, she had an 1% burden.   Past Medical History:  Diagnosis Date   Abnormal colonoscopy 03/2017   decreased rectal tone and polyp; 5 year recall   Abnormal findings on esophagogastroduodenoscopy (EGD) 02/25/2016   Z-line irrg. at 40 cm. No abnomrality of the esophagus to explain dysphagia. Esophagus dilated. H/o + H. Pylori   Allergy    ANXIETY 07/06/2007   Aortic atherosclerosis (HCC) 02/08/2018   Aortic valve regurgitation    Atherosclerosis of native coronary artery of native heart without angina pectoris 02/08/2018   Biceps tendonitis on right    Bronchiectasis without complication (HCC) 03/16/2011   CT 2012 IMPRESSION:  1. Apical scarring may account for the plain film abnormality.  2. A 6 mm right upper lobe pulmonary nodule.  - - HRCT 07/12/2017 >>>  Scattered minimal cylindrical and varicoid bronchiectasis with associated scattered mucoid impaction and minimal  tree-in-bud opacity in both lungs, predominantly in the mid to lower lungs. Findings are stable to slightly worsened since 2015 ches   Chicken pox    Chronic cough 05/18/2017   CT chest 03/19/14  No bronchiectasis (not hrct)  Spirometry 05/18/2017  FEV1  2.15 (93%)  Ratio 76 s rx prior  - FENO 05/18/2017  =   Could not perform   - Allergy profile 05/18/2017 >  Eos 0.2 /  IgE  75 RAST pos cat > dog > mold - HRCT 07/12/2017 >>>  See bronchiectasis - trial of dymista 07/26/2017 > some better but not consistent with it > rechallenge rx one puff each am 10/25/2017  - MCT 11/02/17 >     Colon polyp 03/31/2017   hyperplastic   COVID-19 08/04/2020   mild   Diverticulosis 03/2017   sigmoid   Family history of colon cancer 04/03/2017   Formatting of this note might be different from the original. 1/19 colon - no adenomas Recommend screening exam in 1/24 - [facility]   Family history of ovarian cancer    Genetic testing 03/26/2016   Negative genetic testing on the [facility] 30 gene panel.  The 30-Gene Cancer Panel offered by [facility] includes sequencing and/or deletion duplication testing of the following 30 genes: APC, ATM, BAP1, BARD1, BMPR1A, BRCA1, BRCA2, BRIP1, CDH1, CDK4, CDKN2A (p14ARF), CDKN2A (p16INK4a), CHEK2, EPCAM, GREM1, MITF, MLH1, MSH2, MSH6, MUTYH, NBN, PALB2, PMS2, POLD1, POLE, PTEN, RAD51C, RAD51D, SMAD4,    GERD 11/09/2006   H. pylori infection    h/o treated wiht prevpac   Hormone replacement therapy 01/08/2019   HYPERLIPIDEMIA 11/09/2006   Hypertension  Hypothyroidism 08/03/2017   Lumbar radiculopathy 07/24/2014   Migraine    Mild depression 02/14/2019   Pharyngoesophageal dysphagia 08/03/2017   Piriformis syndrome of right side    Spinal stenosis, lumbar region, with neurogenic claudication 10/16/2014   Epidural 10/02/2014, December 2016    Stomach ulcer 2008   Past Surgical History:  Procedure Laterality Date   CATARACT EXTRACTION Bilateral 04/2021   CERVICAL POLYPECTOMY   01/17/2019   LIPOMA EXCISION     of back   TUBAL LIGATION       Current Outpatient Medications  Medication Sig Dispense Refill   acebutolol (SECTRAL) 200 MG capsule TAKE 1 CAPSULE BY MOUTH TWICE A DAY 180 capsule 3   amLODipine (NORVASC) 5 MG tablet Take 1 tablet (5 mg total) by mouth daily. 90 tablet 2   apixaban (ELIQUIS) 5 MG TABS tablet Take 1 tablet (5 mg total) by mouth 2 (two) times daily. 180 tablet 3   atorvastatin (LIPITOR) 40 MG tablet Take 1 tablet (40 mg total) by mouth daily. 90 tablet 3   Azelastine HCl 137 MCG/SPRAY SOLN Place into both nostrils.     chlorpheniramine (CHLOR-TRIMETON) 4 MG tablet Take 4 mg by mouth every 4 (four) hours as needed for allergies.     Cholecalciferol (DIALYVITE VITAMIN D 5000 PO) Take by mouth.     estradiol (VIVELLE-DOT) 0.0375 MG/24HR Place 1 patch onto the skin 2 (two) times a week.     gabapentin (NEURONTIN) 300 MG capsule Take 300 mg by mouth. 3-4 times per day     levothyroxine (SYNTHROID) 88 MCG tablet Take 1 tablet (88 mcg total) by mouth daily. 90 tablet 3   losartan (COZAAR) 25 MG tablet Take 0.5 tablets (12.5 mg total) by mouth daily. 45 tablet 2   MAGNESIUM PO Take by mouth.     progesterone (PROMETRIUM) 100 MG capsule Take 100 mg by mouth in the morning and at bedtime. TAKING DAILY     traZODone (DESYREL) 50 MG tablet Take 0.5-1 tablets (25-50 mg total) by mouth at bedtime as needed for sleep. Take half tablet by mouth as needed at bedtime 90 tablet 1   VITAMIN K PO Take by mouth. (Patient not taking: Reported on 08/06/2022)     No current facility-administered medications for this visit.    Allergies:   Tetracycline hcl   Social History:  The patient  reports that she quit smoking about 48 years ago. Her smoking use included cigarettes. She has a 32.50 pack-year smoking history. She has never used smokeless tobacco. She reports current alcohol use of about 2.0 standard drinks of alcohol per week. She reports that she does not use  drugs.   Family History:  The patient's family history includes Breast cancer in her cousin; Cancer in an other family member; Colon cancer in her maternal uncle and paternal aunt; Colon cancer (age of onset: 34) in her father; Colon polyps in her brother; Dementia in her mother; Heart attack in her maternal grandfather; Hyperlipidemia in her brother and mother; Hypertension in her brother; Uterine cancer in her maternal aunt.    ROS:  Please see the history of present illness.   Otherwise, review of systems is positive for none.   All other systems are reviewed and negative.    PHYSICAL EXAM: VS:  BP 114/80   Pulse (!) 53   Ht 5\' 5"  (1.651 m)   Wt 128 lb (58.1 kg)   SpO2 99%   BMI 21.30 kg/m  , BMI Body  mass index is 21.3 kg/m. GEN: Well nourished, well developed, in no acute distress  HEENT: normal  Neck: no JVD, carotid bruits, or masses Cardiac: RRR; no murmurs, rubs, or gallops,no edema  Respiratory:  clear to auscultation bilaterally, normal work of breathing GI: soft, nontender, nondistended, + BS MS: no deformity or atrophy  Skin: warm and dry Neuro:  Strength and sensation are intact Psych: euthymic mood, full affect  EKG:  EKG is ordered today. Personal review of the ekg ordered shows sinus rhythm  Recent Labs: 11/26/2021: Magnesium 2.3 07/06/2022: ALT 17; BUN 15; Creatinine, Ser 0.93; Hemoglobin 13.8; Platelets 259.0; Potassium 4.4; Sodium 142; TSH 1.99    Lipid Panel     Component Value Date/Time   CHOL 143 07/06/2022 0821   TRIG 96.0 07/06/2022 0821   HDL 63.30 07/06/2022 0821   CHOLHDL 2 07/06/2022 0821   VLDL 19.2 07/06/2022 0821   LDLCALC 61 07/06/2022 0821   LDLDIRECT 158.2 05/07/2013 0928     Wt Readings from Last 3 Encounters:  08/06/22 128 lb (58.1 kg)  07/13/22 127 lb 8 oz (57.8 kg)  07/08/22 124 lb 3.2 oz (56.3 kg)      Other studies Reviewed: Additional studies/ records that were reviewed today include: TTE 12/08/21  Review of the above  records today demonstrates:   1. Left ventricular ejection fraction, by estimation, is 60 to 65%. The  left ventricle has normal function. The left ventricle has no regional  wall motion abnormalities. Left ventricular diastolic parameters are  consistent with Grade II diastolic  dysfunction (pseudonormalization).   2. Right ventricular systolic function is normal. The right ventricular  size is normal. There is normal pulmonary artery systolic pressure. The  estimated right ventricular systolic pressure is 24.9 mmHg.   3. PISA 0.7cm. The mitral valve is myxomatous. Moderate mitral valve  regurgitation. No evidence of mitral stenosis.   4. The aortic valve is tricuspid. Aortic valve regurgitation is mild. No  aortic stenosis is present.   5. The inferior vena cava is normal in size with greater than 50%  respiratory variability, suggesting right atrial pressure of 3 mmHg.   Cardiac monitor 01/22/2022 personally reviewed The minimum heart rate was 49  bpm, maximum heart rate was 125  bpm, and average heart rate was 65 bpm. Predominant underlying rhythm was Sinus Rhythm.  Premature atrial complexes were rare. Premature Ventricular complexes rare. Paroxysmal atrial fibrillation noted ( <1%). No pauses, No AV block  ASSESSMENT AND PLAN:  1.  Paroxysmal atrial fibrillation: Currently on Eliquis.  CHA2DS2-VASc of at least 5.  She has had a stroke.  Despite this, she has had a low burden of atrial fibrillation.  I offered ILR implant for further monitoring.  She would like to think about this further.  I also discussed with primary cardiology.  2.  Coronary artery disease: Moderate on coronary CTA.  No current chest pain.  Plan per primary cardiology.  3.  Hypertension: Currently well-controlled  4.  Secondary hypercoagulable state: Currently on Eliquis for atrial fibrillation  Current medicines are reviewed at length with the patient today.   The patient does not have concerns regarding  her medicines.  The following changes were made today:  none  Labs/ tests ordered today include:  Orders Placed This Encounter  Procedures   EKG 12-Lead     Disposition:   FU pending ILR implant  Signed, Blessen Kimbrough Jorja Loa, MD  08/06/2022 12:47 PM     [facility] [address] [place] [address] [phone] (office) [phone] (fax)

## 2022-08-06 NOTE — Patient Instructions (Signed)
Medication Instructions:  Your physician recommends that you continue on your current medications as directed. Please refer to the Current Medication list given to you today.  *If you need a refill on your cardiac medications before your next appointment, please call your pharmacy*   Lab Work: None ordered   Testing/Procedures: None ordered   Follow-Up: At [facility], you and your health needs are our priority.  As part of our continuing mission to provide you with exceptional heart care, we have created designated Provider Care Teams.  These Care Teams include your primary Cardiologist (physician) and Advanced Practice Providers (APPs -  Physician Assistants and Nurse Practitioners) who all work together to provide you with the care you need, when you need it.  Your next appointment:   To   be determined -- Dr. Elberta Fortis is going to speak with Dr. Servando Salina  The format for your next appointment:   In Person  Provider:   Loman Brooklyn, MD    Thank you for choosing [facility]!!   Dory Horn, RN [phone]

## 2022-08-13 ENCOUNTER — Telehealth: Payer: Self-pay | Admitting: *Deleted

## 2022-08-13 NOTE — Telephone Encounter (Signed)
Pt informed that Dr. Elberta Fortis and Dr. Servando Salina spoke. She agreed ILR could be a good idea for afib management. Pt would like to think about this some more, she is just not sure she wants to move forward with this. Aware I will send her some more information on ILR. Aware I will not follow back up, but if she decides she would like to proceed to call the office and we would arrange. Patient verbalized understanding and agreeable to plan.

## 2022-08-17 ENCOUNTER — Encounter: Payer: Self-pay | Admitting: Neurology

## 2022-08-25 ENCOUNTER — Ambulatory Visit (INDEPENDENT_AMBULATORY_CARE_PROVIDER_SITE_OTHER): Payer: Medicare PPO

## 2022-08-25 VITALS — Wt 128.0 lb

## 2022-08-25 DIAGNOSIS — Z0000 Encounter for general adult medical examination without abnormal findings: Secondary | ICD-10-CM | POA: Diagnosis not present

## 2022-08-25 NOTE — Patient Instructions (Signed)
[name] , Thank you for taking time to come for your Medicare Wellness Visit. I appreciate your ongoing commitment to your health goals. Please review the following plan we discussed and let me know if I can assist you in the future.   These are the goals we discussed:  Goals       Increase My Muscle Strength (pt-stated)       Why is this important?   Walking and easy exercises make you stronger. These also give you energy.  Every little bit helps, at any age.          Patient Stated      Maintain current health & activity level      Patient Stated (pt-stated)      None at this time         This is a list of the screening recommended for you and due dates:  Health Maintenance  Topic Date Due   Flu Shot  10/28/2022   Mammogram  07/23/2023   Medicare Annual Wellness Visit  08/25/2023   DTaP/Tdap/Td vaccine (5 - Td or Tdap) 12/29/2026   Colon Cancer Screening  04/01/2027   Pneumonia Vaccine  Completed   DEXA scan (bone density measurement)  Completed   Hepatitis C Screening  Completed   Zoster (Shingles) Vaccine  Completed   HPV Vaccine  Aged Out   COVID-19 Vaccine  Discontinued    Advanced directives: Please bring a copy of your health care power of attorney and living will to the office at your convenience.  Conditions/risks identified: none at this time   Next appointment: Follow up in one year for your annual wellness visit    Preventive Care 65 Years and Older, Female Preventive care refers to lifestyle choices and visits with your health care provider that can promote health and wellness. What does preventive care include? A yearly physical exam. This is also called an annual well check. Dental exams once or twice a year. Routine eye exams. Ask your health care provider how often you should have your eyes checked. Personal lifestyle choices, including: Daily care of your teeth and gums. Regular physical activity. Eating a healthy diet. Avoiding tobacco  and drug use. Limiting alcohol use. Practicing safe sex. Taking low-dose aspirin every day. Taking vitamin and mineral supplements as recommended by your health care provider. What happens during an annual well check? The services and screenings done by your health care provider during your annual well check will depend on your age, overall health, lifestyle risk factors, and family history of disease. Counseling  Your health care provider may ask you questions about your: Alcohol use. Tobacco use. Drug use. Emotional well-being. Home and relationship well-being. Sexual activity. Eating habits. History of falls. Memory and ability to understand (cognition). Work and work Astronomer. Reproductive health. Screening  You may have the following tests or measurements: Height, weight, and BMI. Blood pressure. Lipid and cholesterol levels. These may be checked every 5 years, or more frequently if you are over 78 years old. Skin check. Lung cancer screening. You may have this screening every year starting at age 35 if you have a 30-pack-year history of smoking and currently smoke or have quit within the past 15 years. Fecal occult blood test (FOBT) of the stool. You may have this test every year starting at age 56. Flexible sigmoidoscopy or colonoscopy. You may have a sigmoidoscopy every 5 years or a colonoscopy every 10 years starting at age 47. Hepatitis C blood test.  Hepatitis B blood test. Sexually transmitted disease (STD) testing. Diabetes screening. This is done by checking your blood sugar (glucose) after you have not eaten for a while (fasting). You may have this done every 1-3 years. Bone density scan. This is done to screen for osteoporosis. You may have this done starting at age 62. Mammogram. This may be done every 1-2 years. Talk to your health care provider about how often you should have regular mammograms. Talk with your health care provider about your test results,  treatment options, and if necessary, the need for more tests. Vaccines  Your health care provider may recommend certain vaccines, such as: Influenza vaccine. This is recommended every year. Tetanus, diphtheria, and acellular pertussis (Tdap, Td) vaccine. You may need a Td booster every 10 years. Zoster vaccine. You may need this after age 21. Pneumococcal 13-valent conjugate (PCV13) vaccine. One dose is recommended after age 32. Pneumococcal polysaccharide (PPSV23) vaccine. One dose is recommended after age 63. Talk to your health care provider about which screenings and vaccines you need and how often you need them. This information is not intended to replace advice given to you by your health care provider. Make sure you discuss any questions you have with your health care provider. Document Released: 04/11/2015 Document Revised: 12/03/2015 Document Reviewed: 01/14/2015 Elsevier Interactive Patient Education  2017 [employer].  Fall Prevention in the Home Falls can cause injuries. They can happen to people of all ages. There are many things you can do to make your home safe and to help prevent falls. What can I do on the outside of my home? Regularly fix the edges of walkways and driveways and fix any cracks. Remove anything that might make you trip as you walk through a door, such as a raised step or threshold. Trim any bushes or trees on the path to your home. Use bright outdoor lighting. Clear any walking paths of anything that might make someone trip, such as rocks or tools. Regularly check to see if handrails are loose or broken. Make sure that both sides of any steps have handrails. Any raised decks and porches should have guardrails on the edges. Have any leaves, snow, or ice cleared regularly. Use sand or salt on walking paths during winter. Clean up any spills in your garage right away. This includes oil or grease spills. What can I do in the bathroom? Use night  lights. Install grab bars by the toilet and in the tub and shower. Do not use towel bars as grab bars. Use non-skid mats or decals in the tub or shower. If you need to sit down in the shower, use a plastic, non-slip stool. Keep the floor dry. Clean up any water that spills on the floor as soon as it happens. Remove soap buildup in the tub or shower regularly. Attach bath mats securely with double-sided non-slip rug tape. Do not have throw rugs and other things on the floor that can make you trip. What can I do in the bedroom? Use night lights. Make sure that you have a light by your bed that is easy to reach. Do not use any sheets or blankets that are too big for your bed. They should not hang down onto the floor. Have a firm chair that has side arms. You can use this for support while you get dressed. Do not have throw rugs and other things on the floor that can make you trip. What can I do in the kitchen? Clean up  any spills right away. Avoid walking on wet floors. Keep items that you use a lot in easy-to-reach places. If you need to reach something above you, use a strong step stool that has a grab bar. Keep electrical cords out of the way. Do not use floor polish or wax that makes floors slippery. If you must use wax, use non-skid floor wax. Do not have throw rugs and other things on the floor that can make you trip. What can I do with my stairs? Do not leave any items on the stairs. Make sure that there are handrails on both sides of the stairs and use them. Fix handrails that are broken or loose. Make sure that handrails are as long as the stairways. Check any carpeting to make sure that it is firmly attached to the stairs. Fix any carpet that is loose or worn. Avoid having throw rugs at the top or bottom of the stairs. If you do have throw rugs, attach them to the floor with carpet tape. Make sure that you have a light switch at the top of the stairs and the bottom of the stairs. If  you do not have them, ask someone to add them for you. What else can I do to help prevent falls? Wear shoes that: Do not have high heels. Have rubber bottoms. Are comfortable and fit you well. Are closed at the toe. Do not wear sandals. If you use a stepladder: Make sure that it is fully opened. Do not climb a closed stepladder. Make sure that both sides of the stepladder are locked into place. Ask someone to hold it for you, if possible. Clearly mark and make sure that you can see: Any grab bars or handrails. First and last steps. Where the edge of each step is. Use tools that help you move around (mobility aids) if they are needed. These include: Canes. Walkers. Scooters. Crutches. Turn on the lights when you go into a dark area. Replace any light bulbs as soon as they burn out. Set up your furniture so you have a clear path. Avoid moving your furniture around. If any of your floors are uneven, fix them. If there are any pets around you, be aware of where they are. Review your medicines with your doctor. Some medicines can make you feel dizzy. This can increase your chance of falling. Ask your doctor what other things that you can do to help prevent falls. This information is not intended to replace advice given to you by your health care provider. Make sure you discuss any questions you have with your health care provider. Document Released: 01/09/2009 Document Revised: 08/21/2015 Document Reviewed: 04/19/2014 Elsevier Interactive Patient Education  2017 [employer].

## 2022-08-25 NOTE — Progress Notes (Signed)
I connected with  Arby Barrette on 08/25/22 by a audio enabled telemedicine application and verified that I am speaking with the correct person using two identifiers.  Patient Location: Home  Provider Location: Home Office  I discussed the limitations of evaluation and management by telemedicine. The patient expressed understanding and agreed to proceed.   Subjective:   Yvette Short is a 77 y.o. female who presents for Medicare Annual (Subsequent) preventive examination.  Review of Systems     Cardiac Risk Factors include: advanced age (>12men, >22 women);dyslipidemia;hypertension     Objective:    Today's Vitals   08/25/22 1519  Weight: 128 lb (58.1 kg)   Body mass index is 21.3 kg/m.     08/25/2022    3:26 PM 04/15/2022    1:20 PM 06/02/2021    1:28 PM 05/07/2020    9:07 AM 04/15/2020   12:33 PM  Advanced Directives  Does Patient Have a Medical Advance Directive? Yes Yes Yes Yes No  Type of Estate agent of [place];Living will Healthcare Power of [place];Living will  Healthcare Power of [place];Living will   Does patient want to make changes to medical advance directive?   No - Patient declined    Copy of Healthcare Power of Attorney in Chart? No - copy requested   No - copy requested     Current Medications (verified) Outpatient Encounter Medications as of 08/25/2022  Medication Sig   acebutolol (SECTRAL) 200 MG capsule TAKE 1 CAPSULE BY MOUTH TWICE A DAY   amLODipine (NORVASC) 5 MG tablet Take 1 tablet (5 mg total) by mouth daily.   apixaban (ELIQUIS) 5 MG TABS tablet Take 1 tablet (5 mg total) by mouth 2 (two) times daily.   atorvastatin (LIPITOR) 40 MG tablet Take 1 tablet (40 mg total) by mouth daily.   Azelastine HCl 137 MCG/SPRAY SOLN Place into both nostrils.   chlorpheniramine (CHLOR-TRIMETON) 4 MG tablet Take 4 mg by mouth every 4 (four) hours as needed for allergies.   Cholecalciferol (DIALYVITE VITAMIN D 5000 PO) Take by  mouth.   estradiol (VIVELLE-DOT) 0.0375 MG/24HR Place 1 patch onto the skin 2 (two) times a week.   gabapentin (NEURONTIN) 300 MG capsule Take 300 mg by mouth. 3-4 times per day   levothyroxine (SYNTHROID) 88 MCG tablet Take 1 tablet (88 mcg total) by mouth daily.   losartan (COZAAR) 25 MG tablet Take 0.5 tablets (12.5 mg total) by mouth daily.   MAGNESIUM PO Take by mouth.   progesterone (PROMETRIUM) 100 MG capsule Take 100 mg by mouth in the morning and at bedtime. TAKING DAILY   traZODone (DESYREL) 50 MG tablet Take 0.5-1 tablets (25-50 mg total) by mouth at bedtime as needed for sleep. Take half tablet by mouth as needed at bedtime   VITAMIN K PO Take by mouth.   No facility-administered encounter medications on file as of 08/25/2022.    Allergies (verified) Tetracycline hcl   History: Past Medical History:  Diagnosis Date   Abnormal colonoscopy 03/2017   decreased rectal tone and polyp; 5 year recall   Abnormal findings on esophagogastroduodenoscopy (EGD) 02/25/2016   Z-line irrg. at 40 cm. No abnomrality of the esophagus to explain dysphagia. Esophagus dilated. H/o + H. Pylori   Allergy    ANXIETY 07/06/2007   Aortic atherosclerosis (HCC) 02/08/2018   Aortic valve regurgitation    Atherosclerosis of native coronary artery of native heart without angina pectoris 02/08/2018   Biceps tendonitis on right  Bronchiectasis without complication (HCC) 03/16/2011   CT 2012 IMPRESSION:  1. Apical scarring may account for the plain film abnormality.  2. A 6 mm right upper lobe pulmonary nodule.  - - HRCT 07/12/2017 >>>  Scattered minimal cylindrical and varicoid bronchiectasis with associated scattered mucoid impaction and minimal tree-in-bud opacity in both lungs, predominantly in the mid to lower lungs. Findings are stable to slightly worsened since 2015 ches   Chicken pox    Chronic cough 05/18/2017   CT chest 03/19/14  No bronchiectasis (not hrct)  Spirometry 05/18/2017  FEV1  2.15  (93%)  Ratio 76 s rx prior  - FENO 05/18/2017  =   Could not perform   - Allergy profile 05/18/2017 >  Eos 0.2 /  IgE  75 RAST pos cat > dog > mold - HRCT 07/12/2017 >>>  See bronchiectasis - trial of dymista 07/26/2017 > some better but not consistent with it > rechallenge rx one puff each am 10/25/2017  - MCT 11/02/17 >     Colon polyp 03/31/2017   hyperplastic   COVID-19 08/04/2020   mild   Diverticulosis 03/2017   sigmoid   Family history of colon cancer 04/03/2017   Formatting of this note might be different from the original. 1/19 colon - no adenomas Recommend screening exam in 1/24 - [facility]   Family history of ovarian cancer    Genetic testing 03/26/2016   Negative genetic testing on the [facility] 30 gene panel.  The 30-Gene Cancer Panel offered by [facility] includes sequencing and/or deletion duplication testing of the following 30 genes: APC, ATM, BAP1, BARD1, BMPR1A, BRCA1, BRCA2, BRIP1, CDH1, CDK4, CDKN2A (p14ARF), CDKN2A (p16INK4a), CHEK2, EPCAM, GREM1, MITF, MLH1, MSH2, MSH6, MUTYH, NBN, PALB2, PMS2, POLD1, POLE, PTEN, RAD51C, RAD51D, SMAD4,    GERD 11/09/2006   H. pylori infection    h/o treated wiht prevpac   Hormone replacement therapy 01/08/2019   HYPERLIPIDEMIA 11/09/2006   Hypertension    Hypothyroidism 08/03/2017   Lumbar radiculopathy 07/24/2014   Migraine    Mild depression 02/14/2019   Pharyngoesophageal dysphagia 08/03/2017   Piriformis syndrome of right side    Spinal stenosis, lumbar region, with neurogenic claudication 10/16/2014   Epidural 10/02/2014, December 2016    Stomach ulcer 2008   Past Surgical History:  Procedure Laterality Date   CATARACT EXTRACTION Bilateral 04/2021   CERVICAL POLYPECTOMY  01/17/2019   LIPOMA EXCISION     of back   TUBAL LIGATION     Family History  Problem Relation Age of Onset   Dementia Mother    Hyperlipidemia Mother    Colon cancer Father 16   Colon polyps Brother        gets colonoscopy every 2-3 years    Hyperlipidemia Brother    Hypertension Brother    Uterine cancer Maternal Aunt        dx in her 34s   Colon cancer Maternal Uncle    Colon cancer Paternal Aunt    Heart attack Maternal Grandfather    Cancer Other        MGMs sister - "abdominal cancer"   Breast cancer Cousin    Social History   Socioeconomic History   Marital status: Married    Spouse name: Not on file   Number of children: Not on file   Years of education: Not on file   Highest education level: Not on file  Occupational History   Not on file  Tobacco Use   Smoking status: Former  Packs/day: 2.50    Years: 13.00    Additional pack years: 0.00    Total pack years: 32.50    Types: Cigarettes    Quit date: 07/28/1974    Years since quitting: 48.1   Smokeless tobacco: Never  Vaping Use   Vaping Use: Never used  Substance and Sexual Activity   Alcohol use: Yes    Alcohol/week: 2.0 standard drinks of alcohol    Types: 2 Cans of beer per week   Drug use: No   Sexual activity: Yes    Partners: Male  Other Topics Concern   Not on file  Social History Narrative   Married. Two children.    College grad.    Former smoker.    Exercises routinely.    Drink caffeine.    Wears a hearing aid.    Smoke alarm in the home.    Wears her seatbelt.    Feels safe in her relationships.    Social Determinants of Health   Financial Resource Strain: Low Risk  (08/25/2022)   Overall Financial Resource Strain (CARDIA)    Difficulty of Paying Living Expenses: Not hard at all  Food Insecurity: No Food Insecurity (08/25/2022)   Hunger Vital Sign    Worried About Running Out of Food in the Last Year: Never true    Ran Out of Food in the Last Year: Never true  Transportation Needs: No Transportation Needs (08/25/2022)   PRAPARE - Administrator, Civil Service (Medical): No    Lack of Transportation (Non-Medical): No  Physical Activity: Sufficiently Active (08/25/2022)   Exercise Vital Sign    Days of Exercise  per Week: 3 days    Minutes of Exercise per Session: 60 min  Stress: No Stress Concern Present (08/25/2022)   [employer] of Occupational Health - Occupational Stress Questionnaire    Feeling of Stress : Not at all  Social Connections: Moderately Integrated (08/25/2022)   Social Connection and Isolation Panel [NHANES]    Frequency of Communication with Friends and Family: Once a week    Frequency of Social Gatherings with Friends and Family: Three times a week    Attends Religious Services: More than 4 times per year    Active Member of Clubs or Organizations: No    Attends Banker Meetings: Never    Marital Status: Married    Tobacco Counseling Counseling given: Not Answered   Clinical Intake:  Pre-visit preparation completed: Yes  Pain : No/denies pain     BMI - recorded: 21.3 Nutritional Status: BMI of 19-24  Normal Nutritional Risks: None Diabetes: No  How often do you need to have someone help you when you read instructions, pamphlets, or other written materials from your doctor or pharmacy?: 1 - Never  Diabetic?no  Interpreter Needed?: No  Information entered by :: Lanier Ensign, LPN   Activities of Daily Living    08/25/2022    3:27 PM  In your present state of health, do you have any difficulty performing the following activities:  Hearing? 1  Comment hearing aids  Vision? 0  Difficulty concentrating or making decisions? 0  Walking or climbing stairs? 0  Dressing or bathing? 0  Doing errands, shopping? 0  Preparing Food and eating ? N  Using the Toilet? N  In the past six months, have you accidently leaked urine? Y  Comment wears a pad  Do you have problems with loss of bowel control? N  Managing your Medications? N  Managing your Finances? N  Housekeeping or managing your Housekeeping? N    Patient Care Team: Natalia Leatherwood, DO as PCP - General (Family Medicine) Thomasene Ripple, DO as PCP - Cardiology (Cardiology) Marcelle Overlie, MD as Consulting Physician (Obstetrics and Gynecology) Nyoka Cowden, MD as Consulting Physician (Pulmonary Disease) Judi Saa, DO as Consulting Physician (Sports Medicine) Willa Rough, MD as Referring Physician (Gastroenterology) Blima Ledger, OD (Optometry) Thomasene Ripple, DO as Consulting Physician (Cardiology)  Indicate any recent Medical Services you may have received from other than [facility] providers in the past year (date may be approximate).     Assessment:   This is a routine wellness examination for [place].  Hearing/Vision screen Hearing Screening - Comments:: Pt wears hearing aids  Vision Screening - Comments:: Pt follows up with Dr Charlotte Sanes for annual eye exams  Dietary issues and exercise activities discussed: Current Exercise Habits: Home exercise routine, Type of exercise: stretching;Other - see comments, Time (Minutes): 60, Frequency (Times/Week): 3, Weekly Exercise (Minutes/Week): 180   Goals Addressed               This Visit's Progress     Patient Stated (pt-stated)        None at this time        Depression Screen    08/25/2022    3:24 PM 07/06/2022    8:20 AM 06/02/2021    1:16 PM 01/07/2021    8:38 AM 06/26/2020    8:48 AM 05/07/2020    9:10 AM 12/25/2019    8:46 AM  PHQ 2/9 Scores  PHQ - 2 Score 0 0 0 0 1 0 1  PHQ- 9 Score    0 6  4    Fall Risk    08/25/2022    3:26 PM 07/06/2022    8:19 AM 01/07/2021    8:39 AM 06/26/2020    8:48 AM 05/07/2020    9:08 AM  Fall Risk   Falls in the past year? 0 0 0 0 1  Number falls in past yr: 0 0 0 0 1  Injury with Fall? 0 0 0 0 1  Risk for fall due to : Impaired vision;Impaired balance/gait    History of fall(s)  Follow up Falls prevention discussed   Falls evaluation completed Falls prevention discussed    FALL RISK PREVENTION PERTAINING TO THE HOME:  Any stairs in or around the home? Yes  If so, are there any without handrails? No  Home free of loose throw rugs in walkways, pet beds,  electrical cords, etc? Yes  Adequate lighting in your home to reduce risk of falls? Yes   ASSISTIVE DEVICES UTILIZED TO PREVENT FALLS:  Life alert? No  Use of a cane, walker or w/c? No  Grab bars in the bathroom? No  Shower chair or bench in shower? Yes  Elevated toilet seat or a handicapped toilet? No   TIMED UP AND GO:  Was the test performed? No .    Cognitive Function:        08/25/2022    3:28 PM 06/02/2021    1:20 PM  6CIT Screen  What Year? 0 points 0 points  What month? 0 points 0 points  What time? 0 points 0 points  Count back from 20 0 points 0 points  Months in reverse 0 points 0 points  Repeat phrase 0 points 0 points  Total Score 0 points 0 points    Immunizations Immunization History  Administered  Date(s) Administered   Fluad Quad(high Dose 65+) 12/25/2019, 11/30/2021   Influenza Split 12/21/2010, 12/21/2011   Influenza Whole 01/04/2008, 12/31/2008, 01/14/2010   Influenza, High Dose Seasonal PF 01/03/2013, 12/26/2013, 02/12/2016, 12/28/2016, 12/05/2020   Influenza-Unspecified 12/06/2017   PFIZER Comirnaty(Gray Top)Covid-19 Tri-Sucrose Vaccine 12/23/2021   PFIZER(Purple Top)SARS-COV-2 Vaccination 04/22/2019, 05/14/2019, 01/25/2020   Pfizer Covid-19 Vaccine Bivalent Booster 59yrs & up 12/05/2020   Pneumococcal Conjugate-13 02/16/2016   Pneumococcal Polysaccharide-23 04/10/2012   Respiratory Syncytial Virus Vaccine,Recomb Aduvanted(Arexvy) 12/23/2021   Td 03/09/2001, 12/10/2008   Tdap 12/10/2008, 12/28/2016   Zoster Recombinat (Shingrix) 08/03/2017, 12/29/2017, 12/29/2017   Zoster, Live 12/21/2010    TDAP status: Up to date  Flu Vaccine status: Up to date  Pneumococcal vaccine status: Up to date  Covid-19 vaccine status: Completed vaccines  Qualifies for Shingles Vaccine? Yes   Zostavax completed Yes   Shingrix Completed?: Yes  Screening Tests Health Maintenance  Topic Date Due   INFLUENZA VACCINE  10/28/2022   MAMMOGRAM  07/23/2023    Medicare Annual Wellness (AWV)  08/25/2023   DTaP/Tdap/Td (5 - Td or Tdap) 12/29/2026   Colonoscopy  04/01/2027   Pneumonia Vaccine 46+ Years old  Completed   DEXA SCAN  Completed   Hepatitis C Screening  Completed   Zoster Vaccines- Shingrix  Completed   HPV VACCINES  Aged Out   COVID-19 Vaccine  Discontinued    Health Maintenance  There are no preventive care reminders to display for this patient.   Colorectal cancer screening: Type of screening: Colonoscopy. Completed 03/31/17. Repeat every 10 years  Mammogram status: Completed 07/23/22. Repeat every year     Additional Screening:  Hepatitis C Screening:  Completed 03/29/16  Vision Screening: Recommended annual ophthalmology exams for early detection of glaucoma and other disorders of the eye. Is the patient up to date with their annual eye exam?  Yes  Who is the provider or what is the name of the office in which the patient attends annual eye exams? Dr Charlotte Sanes  If pt is not established with a provider, would they like to be referred to a provider to establish care? No .   Dental Screening: Recommended annual dental exams for proper oral hygiene  Community Resource Referral / Chronic Care Management: CRR required this visit?  No   CCM required this visit?  No      Plan:     I have personally reviewed and noted the following in the patient's chart:   Medical and social history Use of alcohol, tobacco or illicit drugs  Current medications and supplements including opioid prescriptions. Patient is not currently taking opioid prescriptions. Functional ability and status Nutritional status Physical activity Advanced directives List of other physicians Hospitalizations, surgeries, and ER visits in previous 12 months Vitals Screenings to include cognitive, depression, and falls Referrals and appointments  In addition, I have reviewed and discussed with patient certain preventive protocols, quality metrics, and best  practice recommendations. A written personalized care plan for preventive services as well as general preventive health recommendations were provided to patient.     Marzella Schlein, LPN   [id number]/61/0960   Nurse Notes: none

## 2022-08-31 DIAGNOSIS — G4733 Obstructive sleep apnea (adult) (pediatric): Secondary | ICD-10-CM | POA: Diagnosis not present

## 2022-09-01 DIAGNOSIS — G4733 Obstructive sleep apnea (adult) (pediatric): Secondary | ICD-10-CM | POA: Diagnosis not present

## 2022-10-01 DIAGNOSIS — G4733 Obstructive sleep apnea (adult) (pediatric): Secondary | ICD-10-CM | POA: Diagnosis not present

## 2022-10-05 ENCOUNTER — Ambulatory Visit: Payer: Medicare PPO | Admitting: Neurology

## 2022-10-05 ENCOUNTER — Encounter: Payer: Self-pay | Admitting: Neurology

## 2022-10-05 VITALS — BP 130/71 | HR 58 | Ht 65.0 in | Wt 128.0 lb

## 2022-10-05 DIAGNOSIS — G4733 Obstructive sleep apnea (adult) (pediatric): Secondary | ICD-10-CM

## 2022-10-05 NOTE — Patient Instructions (Addendum)
Mindfulness-Based Stress Reduction Mindfulness-based stress reduction (MBSR) is a program that helps people learn to practice mindfulness. Mindfulness is the practice of consciously paying attention to the present moment. MBSR focuses on developing self-awareness, which lets you respond to life stress without judgment or negative feelings. It can be learned and practiced through techniques such as education, breathing exercises, meditation, and yoga. MBSR includes several mindfulness techniques in one program. MBSR works best when you understand the treatment, are willing to try new things, and can commit to spending time practicing what you learn. MBSR training may include learning about: How your feelings, thoughts, and reactions affect your body. New ways to respond to things that cause negative thoughts to start (triggers). How to notice your thoughts and let go of them. Practicing awareness of everyday things that you normally do without thinking. The techniques and goals of different types of meditation. What are the benefits of MBSR? MBSR can have many benefits, which include helping you to: Develop self-awareness. This means knowing and understanding yourself. Learn skills and attitudes that help you to take part in your own health care. Learn new ways to care for yourself. Be more accepting about how things are, and let things go. Be less judgmental and approach things with an open mind. Be patient with yourself and trust yourself more. MBSR has also been shown to: Reduce negative emotions, such as sadness, overwhelm, and worry. Improve memory and focus. Change how you sense and react to pain. Boost your body's ability to fight infections. Help you connect better with other people. Improve your sense of well-being. How to practice mindfulness To do a basic awareness exercise: Find a comfortable place to sit. Pay attention to the present moment. Notice your thoughts, feelings, and  surroundings just as they are. Avoid judging yourself, your feelings, or your surroundings. Make note of any judgment that comes up and let it go. Your mind may wander, and that is okay. Make note of when your thoughts drift, and return your attention to the present moment. To do basic mindfulness meditation: Find a comfortable place to sit. This may include a stable chair or a firm floor cushion. Sit upright with your back straight. Let your arms fall next to your sides, with your hands resting on your legs. If you are sitting in a chair, rest your feet flat on the floor. If you are sitting on a cushion, cross your legs in front of you. Keep your head in a neutral position with your chin dropped slightly. Relax your jaw and rest the tip of your tongue on the roof of your mouth. Drop your gaze to the floor or close your eyes. Breathe normally and pay attention to your breath. Feel the air moving in and out of your nose. Feel your belly expanding and relaxing with each breath. Your mind may wander, and that is okay. Make note of when your thoughts drift, and return your attention to your breath. Avoid judging yourself, your feelings, or your surroundings. Make note of any judgment or feelings that come up, let them go, and bring your attention back to your breath. When you are ready, lift your gaze or open your eyes. Pay attention to how your body feels after the meditation. Follow these instructions at home:  Find a local in-person or online MBSR program. Set aside some time regularly for mindfulness practice. Practice every day if you can. Even 10 minutes of practice is helpful. Find a mindfulness practice that works best for  you. This may include one or more of the following: Meditation. This involves focusing your mind on a certain thought or activity. Breathing awareness exercises. These help you to stay present by focusing on your breath. Body scan. For this practice, you lie down and pay  attention to each part of your body from head to toe. You can identify tension and soreness and consciously relax parts of your body. Yoga. Yoga involves stretching and breathing, and it can improve your ability to move and be flexible. It can also help you to test your body's limits, which can help you release stress. Mindful eating. This way of eating involves focusing on the taste, texture, color, and smell of each bite of food. This slows down eating and helps you feel full sooner. For this reason, it can be an important part of a weight loss plan. Find a podcast or recording that provides guidance for breathing awareness, body scan, or meditation exercises. You can listen to these any time when you have a free moment to rest without distractions. Follow your treatment plan as told by your health care provider. This may include taking regular medicines and making changes to your diet or lifestyle as recommended. Where to find more information You can find more information about MBSR from: Your health care provider. Community-based meditation centers or programs. Programs offered near you. Summary Mindfulness-based stress reduction (MBSR) is a program that teaches you how to consciously pay attention to the present moment. It is used to help you deal better with daily stress, feelings, and pain. MBSR focuses on developing self-awareness, which allows you to respond to life stress without judgment or negative feelings. MBSR programs may involve learning different mindfulness practices, such as breathing exercises, meditation, yoga, body scan, or mindful eating. Find a mindfulness practice that works best for you, and set aside time for it on a regular basis. This information is not intended to replace advice given to you by your health care provider. Make sure you discuss any questions you have with your health care provider. Document Revised: 10/23/2020 Document Reviewed: 10/23/2020 [employer]  Patient Education  2024 [employer] Lacunar Stroke  A lacunar stroke (lacunar infarction) happens when an area of the brain does not get enough oxygen and blood flow. This can lead to  brain damage. A lacunar stroke is a type of ischemic stroke and is a very small lesion but depending on location this can be a medical emergency. It must be treated right away. What are the causes? A lacunar stroke is caused by a blockage in a small artery deep in the brain. This may be due to: High blood pressure (hypertension). A buildup of plaque in the blood vessels (atherosclerosis). This blocks blood flow in the brain. Protein buildup on the artery walls of the brain (amyloid angiopathy). A blocked or damaged artery in the head or neck. Sometimes, the cause of lacunar stroke is not known. What increases the risk? The following conditions may increase your risk of a stroke: High cholesterol (hyperlipidemia). Diabetes. Obesity. Other risk factors that you can change include: Using products that contain nicotine or tobacco. Not being active. Risk factors that you cannot change include: Being over 58 years old. Having a history of blood clots, stroke, or mini-stroke (transient ischemic attack, TIA). Having a family history of stroke. What are the signs or symptoms? Symptoms of a lacunar stroke usually develop suddenly. They may include: Weakness or numbness in your face, arm, or leg, especially on one  side of your body. Loss of balance or coordination. Slurred speech, trouble speaking, trouble understanding speech, or a mix of these. Vision changes, such as double vision, blurred vision, or loss of vision. Dizziness or confusion. Nausea and vomiting. Severe headache. If possible, write down the exact time your symptoms started. Tell your health care provider. If symptoms come and go, they could be signs of a TIA. Get help right away, even if you feel better. How is this diagnosed? A lacunar  stroke may be diagnosed based on: Your symptoms, your medical history, and a physical exam. A CT scan or MRI of the brain. Imaging tests that scan blood flow in the brain (CT angiogram, MRI angiogram, or cerebral angiogram). How is this treated? This condition is an emergency. You must get treatment at the first sign of stroke symptoms. Your treatment will depend on the length, severity, and cause of your symptoms. Treatment may include: Medicines to control blood pressure, cholesterol, and blood sugar. Medicine given through an IV to dissolve the blood clot. Treatments given directly to the affected artery to remove or dissolve the blood clot. Medicines to thin the blood. These treatments may not work as well if too much time has passed since your stroke symptoms began. Even if you do not know when your symptoms began, get treatment as soon as possible. After a stroke, you may work with physical, speech, mental health, or occupational therapists to help you recover. Follow these instructions at home: Medicines Take over-the-counter and prescription medicines only as told by your provider. If you were told to take a medicine to thin your blood, take it exactly as told, at the same time every day. Taking too much blood-thinning medicine can cause bleeding. Taking too little may not protect you against a stroke and other problems. Talk with your provider before taking medicines that contain aspirin or NSAIDs, such as ibuprofen. These medicines can thin your blood and increase the risk of bleeding. Activity Return to your normal activities as told by your provider. Ask your provider what activities are safe for you. Take part in rehabilitation programs as told by your provider. This may include physical therapy, occupational therapy, or speech therapy. Use a walker or cane as told by your provider. Lifestyle Do not use any products that contain nicotine or tobacco. These products include  cigarettes, chewing tobacco, and vaping devices, such as e-cigarettes. If you need help quitting, ask your provider. Do not drink alcohol if your provider tells you not to drink. Follow instructions from your provider about diet. Eat healthy foods. General instructions Keep all follow-up visits. Your provider will need to monitor your recovery. Follow your provider's instructions about preventing falls. Contact a health care provider if: You develop any of the following symptoms: Headaches that keep coming back. Vision problems. Depression, mood swings, anxiety, or irritability. Get help right away if:  You have a partial or total loss of consciousness. You are taking blood thinners and you fall or have a minor injury to the head. You have any symptoms of stroke. "BE FAST" is an easy way to remember the main warning signs of stroke: B - Balance. Signs are dizziness, sudden trouble walking, or loss of balance. E - Eyes. Signs are trouble seeing or a sudden change in vision. F - Face. Signs are sudden weakness or numbness of the face, or the face or eyelid drooping on one side. A - Arms. Signs are weakness or numbness in an arm. This happens suddenly  and usually on one side of the body. S - Speech. Signs are sudden trouble speaking, slurred speech, or trouble understanding what people say. T - Time. Time to call emergency services. Write down what time symptoms started. You have other signs of stroke, such as: A sudden, severe headache. Nausea or vomiting. Seizure. These symptoms may be an emergency. Get help right away. Call 911. Do not wait to see if the symptoms will go away. Do not drive yourself to the hospital. This information is not intended to replace advice given to you by your health care provider. Make sure you discuss any questions you have with your health care provider. Document Revised: 11/17/2021 Document Reviewed: 11/17/2021 [employer] Patient Education  2024 [employer].
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
## 2022-10-18 ENCOUNTER — Other Ambulatory Visit: Payer: Self-pay | Admitting: Family Medicine

## 2022-11-01 DIAGNOSIS — G4733 Obstructive sleep apnea (adult) (pediatric): Secondary | ICD-10-CM | POA: Diagnosis not present

## 2022-11-11 ENCOUNTER — Encounter (INDEPENDENT_AMBULATORY_CARE_PROVIDER_SITE_OTHER): Payer: Self-pay

## 2022-11-20 IMAGING — MR MR SHOULDER*R* W/O CM
5 series · 40 of 40 positions shown · non-contrast
Comparison: X-ray shoulder 01/16/2021.

CLINICAL DATA: Right shoulder and humerus pain with limited range
of motion for 6 weeks. Assess for bursitis.

EXAM:
MRI OF THE RIGHT SHOULDER WITHOUT CONTRAST
TECHNIQUE: Multiplanar, multisequence MR imaging of the shoulder was performed.
No intravenous contrast was administered.

[Series 4: T2 fat-sat · axial · 4.0mm · 0.59mm/px · z∈[-33,+70]mm · 10 of 25 slices shown (1 of 3)]
[im 1/25]
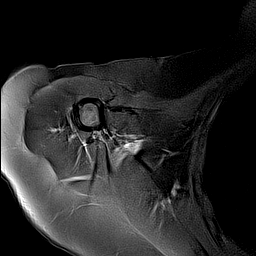
[im 3/25]
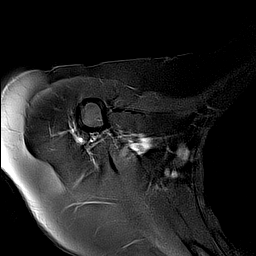
[im 6/25]
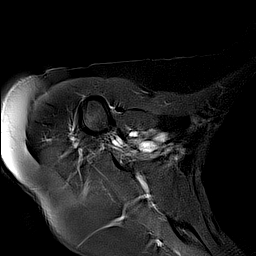
[im 9/25]
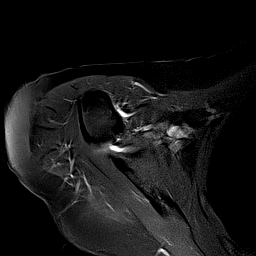
[im 11/25]
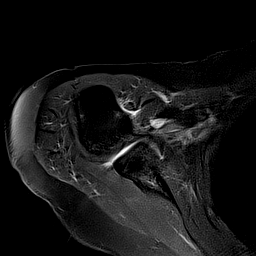
[im 14/25]
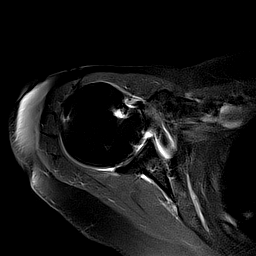
[im 17/25]
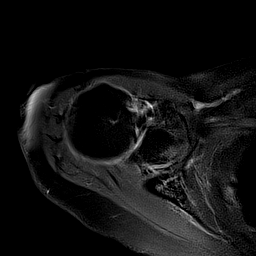
[im 19/25]
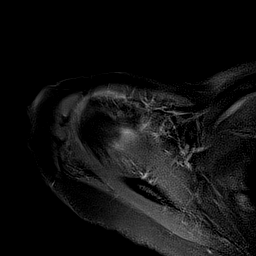
[im 22/25]
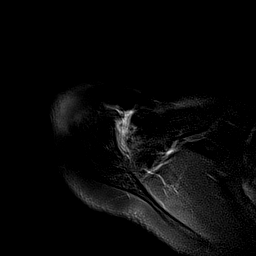
[im 25/25]
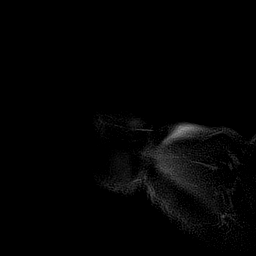

[Series 5: T2 fat-sat · oblique · 4.0mm · 0.62mm/px · 7 of 17 slices shown (2 of 3)]
[im 1/17]
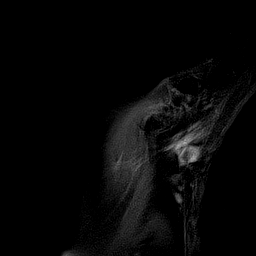
[im 3/17]
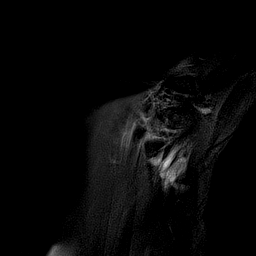
[im 6/17]
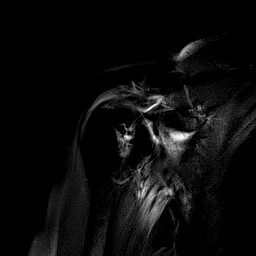
[im 9/17]
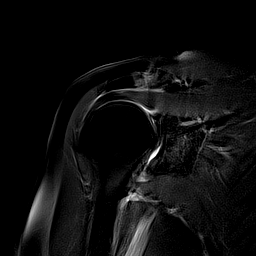
[im 11/17]
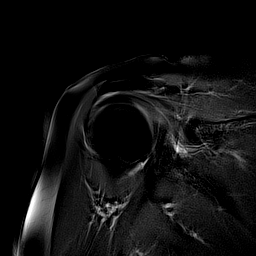
[im 14/17]
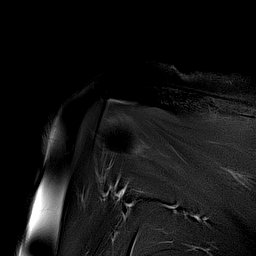
[im 17/17]
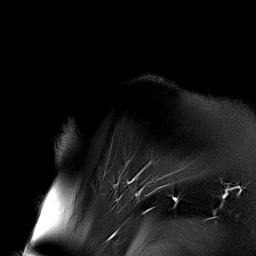

[Series 6: PD · oblique · 4.0mm · 0.62mm/px · 7 of 17 slices shown]
[im 1/17]
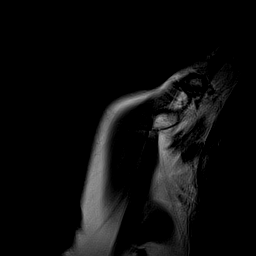
[im 3/17]
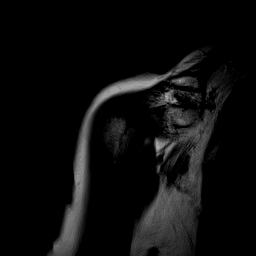
[im 6/17]
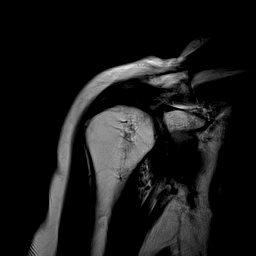
[im 9/17]
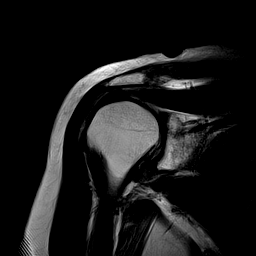
[im 11/17]
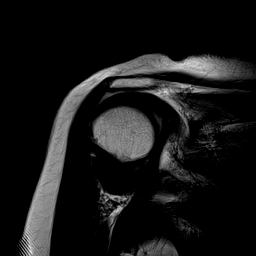
[im 14/17]
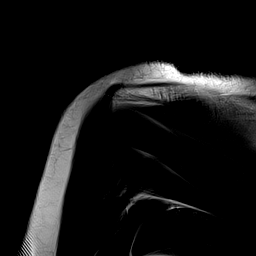
[im 17/17]
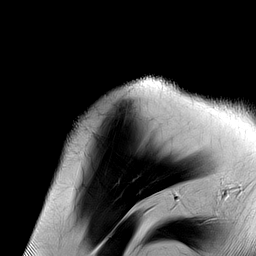

[Series 8: T2 fat-sat · oblique · 4.0mm · 0.55mm/px · 8 of 20 slices shown (3 of 3)]
[im 1/20]
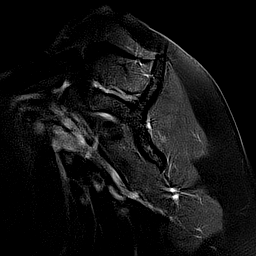
[im 3/20]
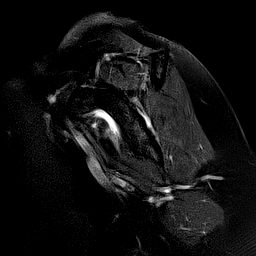
[im 6/20]
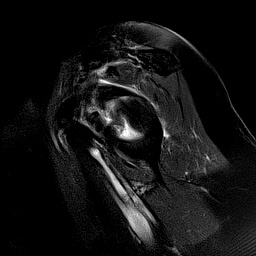
[im 9/20]
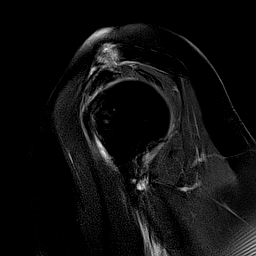
[im 11/20]
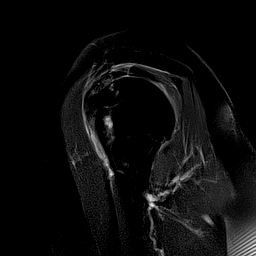
[im 14/20]
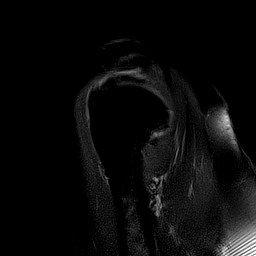
[im 17/20]
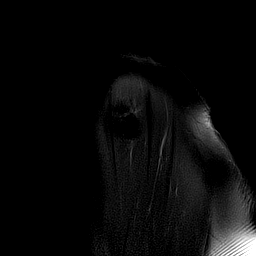
[im 20/20]
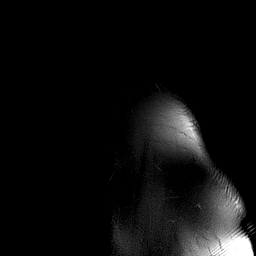

[Series 9: T1 · oblique · 4.0mm · 0.55mm/px · 8 of 20 slices shown]
[im 1/20]
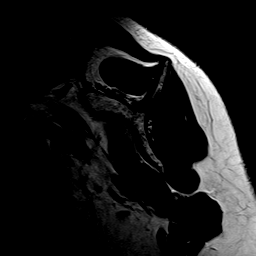
[im 3/20]
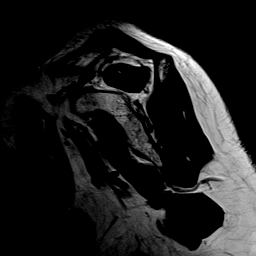
[im 6/20]
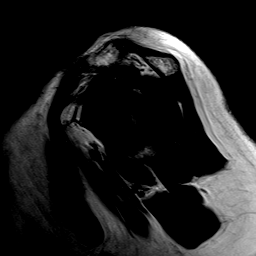
[im 9/20]
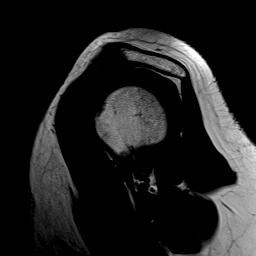
[im 11/20]
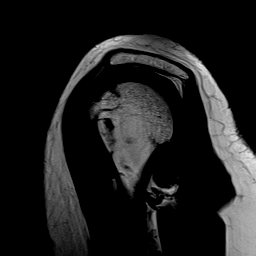
[im 14/20]
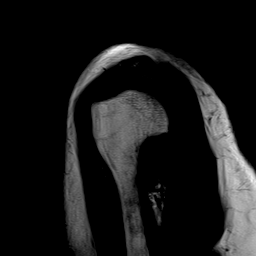
[im 17/20]
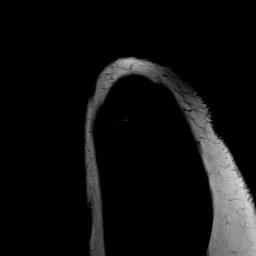
[im 20/20]
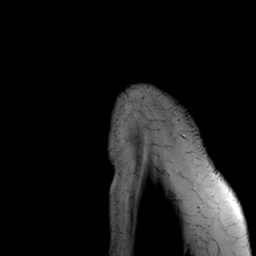

[40 of 40 positions shown; findings below may reference images not displayed]

FINDINGS: Rotator cuff: Mild tendinosis of the supraspinatus tendon with
fraying along the anterior bursal surface. Mild tendinosis of the
infraspinatus tendon. Teres minor tendon is intact. Moderate
tendinosis of the subscapularis tendon with a partial-thickness
tear.

Muscles: No atrophy or abnormal signal of the muscles of the rotator
cuff.

Biceps long head: Moderate tendinosis of the intra-articular portion
and proximal extra-articular portion of the long head of the biceps
tendon.

Acromioclavicular Joint: Moderate arthropathy of the
acromioclavicular joint. Type II acromion. Trace
subacromial/subdeltoid bursal fluid.

Glenohumeral Joint: No joint effusion. Partial-thickness cartilage
loss of the glenohumeral joint.

Labrum: Grossly intact, but evaluation is limited by lack of
intraarticular fluid.

Bones: No acute fracture or dislocation. No aggressive osseous
lesion. Subcortical reactive marrow changes in the lesser
tuberosity.

Other: No fluid collection or hematoma.
IMPRESSION: 1. Mild tendinosis of the supraspinatus tendon with fraying along
the anterior bursal surface.
2. Mild tendinosis of the infraspinatus tendon.
3. Moderate tendinosis of the subscapularis tendon with a
partial-thickness tear.
4. Moderate tendinosis of the intra-articular portion and proximal
extra-articular portion of the long head of the biceps tendon.

## 2022-12-01 DIAGNOSIS — G4733 Obstructive sleep apnea (adult) (pediatric): Secondary | ICD-10-CM | POA: Diagnosis not present

## 2022-12-02 DIAGNOSIS — G4733 Obstructive sleep apnea (adult) (pediatric): Secondary | ICD-10-CM | POA: Diagnosis not present

## 2022-12-07 DIAGNOSIS — S20214A Contusion of middle front wall of thorax, initial encounter: Secondary | ICD-10-CM | POA: Diagnosis not present

## 2022-12-23 ENCOUNTER — Ambulatory Visit: Payer: Medicare PPO | Admitting: Family Medicine

## 2022-12-23 ENCOUNTER — Encounter: Payer: Self-pay | Admitting: Family Medicine

## 2022-12-23 VITALS — BP 120/63 | HR 62 | Temp 98.0°F | Wt 127.6 lb

## 2022-12-23 DIAGNOSIS — I10 Essential (primary) hypertension: Secondary | ICD-10-CM | POA: Diagnosis not present

## 2022-12-23 DIAGNOSIS — R413 Other amnesia: Secondary | ICD-10-CM

## 2022-12-23 DIAGNOSIS — I4821 Permanent atrial fibrillation: Secondary | ICD-10-CM

## 2022-12-23 DIAGNOSIS — I351 Nonrheumatic aortic (valve) insufficiency: Secondary | ICD-10-CM

## 2022-12-23 DIAGNOSIS — I4729 Other ventricular tachycardia: Secondary | ICD-10-CM

## 2022-12-23 DIAGNOSIS — Z8673 Personal history of transient ischemic attack (TIA), and cerebral infarction without residual deficits: Secondary | ICD-10-CM

## 2022-12-23 DIAGNOSIS — G479 Sleep disorder, unspecified: Secondary | ICD-10-CM

## 2022-12-23 DIAGNOSIS — I2510 Atherosclerotic heart disease of native coronary artery without angina pectoris: Secondary | ICD-10-CM

## 2022-12-23 DIAGNOSIS — R053 Chronic cough: Secondary | ICD-10-CM

## 2022-12-23 DIAGNOSIS — J479 Bronchiectasis, uncomplicated: Secondary | ICD-10-CM

## 2022-12-23 DIAGNOSIS — K219 Gastro-esophageal reflux disease without esophagitis: Secondary | ICD-10-CM | POA: Diagnosis not present

## 2022-12-23 DIAGNOSIS — I6389 Other cerebral infarction: Secondary | ICD-10-CM

## 2022-12-23 DIAGNOSIS — I700 Atherosclerosis of aorta: Secondary | ICD-10-CM

## 2022-12-23 DIAGNOSIS — E034 Atrophy of thyroid (acquired): Secondary | ICD-10-CM

## 2022-12-23 DIAGNOSIS — D6869 Other thrombophilia: Secondary | ICD-10-CM | POA: Diagnosis not present

## 2022-12-23 DIAGNOSIS — E782 Mixed hyperlipidemia: Secondary | ICD-10-CM

## 2022-12-23 LAB — COMPREHENSIVE METABOLIC PANEL
ALT: 22 U/L (ref 0–35)
AST: 24 U/L (ref 0–37)
Albumin: 4.4 g/dL (ref 3.5–5.2)
Alkaline Phosphatase: 98 U/L (ref 39–117)
BUN: 21 mg/dL (ref 6–23)
CO2: 32 mEq/L (ref 19–32)
Calcium: 9.8 mg/dL (ref 8.4–10.5)
Chloride: 104 mEq/L (ref 96–112)
Creatinine, Ser: 1.02 mg/dL (ref 0.40–1.20)
GFR: 53.57 mL/min — ABNORMAL LOW (ref >60.00)
Glucose, Bld: 83 mg/dL (ref 70–99)
Potassium: 4.4 mEq/L (ref 3.5–5.1)
Sodium: 142 mEq/L (ref 135–145)
Total Bilirubin: 0.6 mg/dL (ref 0.2–1.2)
Total Protein: 6.6 g/dL (ref 6.0–8.3)

## 2022-12-23 LAB — CBC
HCT: 41.9 % (ref 36.0–46.0)
Hemoglobin: 13.6 g/dL (ref 12.0–15.0)
MCHC: 32.4 g/dL (ref 30.0–36.0)
MCV: 90 fl (ref 78.0–100.0)
Platelets: 263 10*3/uL (ref 150.0–400.0)
RBC: 4.66 Mil/uL (ref 3.87–5.11)
RDW: 13.4 % (ref 11.5–15.5)
WBC: 8.1 10*3/uL (ref 4.0–10.5)

## 2022-12-23 LAB — TSH: TSH: 2.55 u[IU]/mL (ref 0.35–5.50)

## 2022-12-23 MED ORDER — GABAPENTIN 300 MG PO CAPS: 300.0000 mg | ORAL_CAPSULE | Freq: Two times a day (BID) | ORAL | 1 refills | Status: DC

## 2022-12-23 MED ORDER — ATORVASTATIN CALCIUM 40 MG PO TABS: 40.0000 mg | ORAL_TABLET | Freq: Every day | ORAL | 3 refills | Status: DC

## 2022-12-23 MED ORDER — TRAZODONE HCL 50 MG PO TABS: 25.0000 mg | ORAL_TABLET | Freq: Every evening | ORAL | 1 refills | Status: DC | PRN

## 2022-12-23 MED ORDER — AMLODIPINE BESYLATE 5 MG PO TABS: 5.0000 mg | ORAL_TABLET | Freq: Every day | ORAL | 2 refills | Status: DC

## 2022-12-23 MED ORDER — LOSARTAN POTASSIUM 25 MG PO TABS: 12.5000 mg | ORAL_TABLET | Freq: Every day | ORAL | 2 refills | Status: DC

## 2022-12-23 NOTE — Progress Notes (Signed)
Patient ID: Yvette Short, female  DOB: [birth year], 77 y.o.   MRN: [id number] Patient Care Team    Relationship Specialty Notifications Start End  Natalia Leatherwood, DO PCP - General Family Medicine  08/03/17   Thomasene Ripple, DO PCP - Cardiology Cardiology  11/25/21   Marcelle Overlie, MD Consulting Physician Obstetrics and Gynecology  08/03/17   Nyoka Cowden, MD Consulting Physician Pulmonary Disease  08/03/17   Judi Saa, DO Consulting Physician Sports Medicine  08/03/17   Willa Rough, MD Referring Physician Gastroenterology  08/03/17   Blima Ledger, OD  Optometry  08/03/17   Thomasene Ripple, DO Consulting Physician Cardiology  06/30/21     Chief Complaint  Patient presents with   Hypertension    cmc    Subjective:  Yvette Short is a 77 y.o.  Female  present for Chronic Conditions/illness Management All past medical history, surgical history, allergies, family history, immunizations, medications and social history were updated in the electronic medical record today. All recent labs, ED visits and hospitalizations within the last year were reviewed.   HTN/a.fib/Mixed hyperlipidemia/aortic atherosclerosis/AR/MR/CAD-h/o stroke Pt reports compliance with losartan12.5 mg qd, amlodipine 5 mg qd, eliquis, lipitor and sectral Patient denies chest pain, shortness of breath, dizziness or lower extremity edema.  Established with cardio. RF: [id number] CAD.stroke 08/30/2020-echo: Moderate mitral regurgitation, mild to moderate aortic regurgitation, mild to moderate tricuspid regurgitation. The right-sided pressures - mildly elevated.   Hypothyroidism, unspecified type Compliant with levo 88 mcg qd.   Memory deficients:    12/23/2022    9:02 AM  MMSE - Mini Mental State Exam  Orientation to time 5  Orientation to Place 5  Registration 3  Attention/ Calculation 5  Recall 3  Language- name 2 objects 2  Language- repeat 1  Language- follow 3 step command 3  Language- read  & follow direction 1  Write a sentence 1  Copy design 1  Total score 30       12/23/2022    8:24 AM 08/25/2022    3:24 PM 07/06/2022    8:20 AM 06/02/2021    1:16 PM 01/07/2021    8:38 AM  Depression screen PHQ 2/9  Decreased Interest 0 0 0 0 0  Down, Depressed, Hopeless 1 0 0 0 0  PHQ - 2 Score 1 0 0 0 0  Altered sleeping     0  Tired, decreased energy     0  Change in appetite     0  Feeling bad or failure about yourself      0  Trouble concentrating     0  Moving slowly or fidgety/restless     0  Suicidal thoughts     0  PHQ-9 Score     0      06/26/2020    8:48 AM 12/25/2019    8:46 AM  GAD 7 : Generalized Anxiety Score  Nervous, Anxious, on Edge 1 0  Control/stop worrying 0 0  Worry too much - different things 0 0  Trouble relaxing 0 0  Restless 0 0  Easily annoyed or irritable 1 1  Afraid - awful might happen 0 0  Total GAD 7 Score 2 1    Immunization History  Administered Date(s) Administered   Fluad Quad(high Dose 65+) 12/25/2019, 11/30/2021   Influenza Split 12/21/2010, 12/21/2011   Influenza Whole 01/04/2008, 12/31/2008, 01/14/2010   Influenza, High Dose Seasonal PF 01/03/2013, 12/26/2013, 02/12/2016, 12/28/2016, 12/05/2020, 12/01/2022  Influenza-Unspecified 12/06/2017   PFIZER Comirnaty(Gray Top)Covid-19 Tri-Sucrose Vaccine 12/23/2021   PFIZER(Purple Top)SARS-COV-2 Vaccination 04/22/2019, 05/14/2019, 01/25/2020   Pfizer Covid-19 Vaccine Bivalent Booster 65yrs & up 12/05/2020   Pfizer(Comirnaty)Fall Seasonal Vaccine 12 years and older 12/01/2022   Pneumococcal Conjugate-13 02/16/2016   Pneumococcal Polysaccharide-23 04/10/2012   Respiratory Syncytial Virus Vaccine,Recomb Aduvanted(Arexvy) 12/23/2021   Td 03/09/2001, 12/10/2008   Tdap 12/10/2008, 12/28/2016   Zoster Recombinant(Shingrix) 08/03/2017, 12/29/2017, 12/29/2017   Zoster, Live 12/21/2010     Past Medical History:  Diagnosis Date   Abnormal colonoscopy 03/2017   decreased rectal tone and  polyp; 5 year recall   Abnormal findings on esophagogastroduodenoscopy (EGD) 02/25/2016   Z-line irrg. at 40 cm. No abnomrality of the esophagus to explain dysphagia. Esophagus dilated. H/o + H. Pylori   Allergy    ANXIETY 07/06/2007   Aortic atherosclerosis (HCC) 02/08/2018   Aortic valve regurgitation    Atherosclerosis of native coronary artery of native heart without angina pectoris 02/08/2018   Biceps tendonitis on right    Bronchiectasis without complication (HCC) 03/16/2011   CT 2012 IMPRESSION:  1. Apical scarring may account for the plain film abnormality.  2. A 6 mm right upper lobe pulmonary nodule.  - - HRCT 07/12/2017 >>>  Scattered minimal cylindrical and varicoid bronchiectasis with associated scattered mucoid impaction and minimal tree-in-bud opacity in both lungs, predominantly in the mid to lower lungs. Findings are stable to slightly worsened since 2015 ches   Chicken pox    Chronic cough 05/18/2017   CT chest 03/19/14  No bronchiectasis (not hrct)  Spirometry 05/18/2017  FEV1  2.15 (93%)  Ratio 76 s rx prior  - FENO 05/18/2017  =   Could not perform   - Allergy profile 05/18/2017 >  Eos 0.2 /  IgE  75 RAST pos cat > dog > mold - HRCT 07/12/2017 >>>  See bronchiectasis - trial of dymista 07/26/2017 > some better but not consistent with it > rechallenge rx one puff each am 10/25/2017  - MCT 11/02/17 >     Colon polyp 03/31/2017   hyperplastic   COVID-19 08/04/2020   mild   Diverticulosis 03/2017   sigmoid   Family history of colon cancer 04/03/2017   Formatting of this note might be different from the original. 1/19 colon - no adenomas Recommend screening exam in 1/24 - [facility]   Family history of ovarian cancer    Genetic testing 03/26/2016   Negative genetic testing on the [facility] 30 gene panel.  The 30-Gene Cancer Panel offered by [facility] includes sequencing and/or deletion duplication testing of the following 30 genes: APC, ATM, BAP1, BARD1, BMPR1A, BRCA1, BRCA2, BRIP1,  CDH1, CDK4, CDKN2A (p14ARF), CDKN2A (p16INK4a), CHEK2, EPCAM, GREM1, MITF, MLH1, MSH2, MSH6, MUTYH, NBN, PALB2, PMS2, POLD1, POLE, PTEN, RAD51C, RAD51D, SMAD4,    GERD 11/09/2006   H. pylori infection    h/o treated wiht prevpac   Hormone replacement therapy 01/08/2019   HYPERLIPIDEMIA 11/09/2006   Hypertension    Hypothyroidism 08/03/2017   LOC (loss of consciousness) (HCC) 01/26/2022   Lumbar radiculopathy 07/24/2014   Migraine    Mild depression 02/14/2019   Pharyngoesophageal dysphagia 08/03/2017   Piriformis syndrome of right side    Spinal stenosis, lumbar region, with neurogenic claudication 10/16/2014   Epidural 10/02/2014, December 2016    Stomach ulcer 2008   Allergies  Allergen Reactions   Tetracycline Hcl Rash        Past Surgical History:  Procedure Laterality Date  CATARACT EXTRACTION Bilateral 04/2021   CERVICAL POLYPECTOMY  01/17/2019   LIPOMA EXCISION     of back   TUBAL LIGATION     Family History  Problem Relation Age of Onset   Dementia Mother    Hyperlipidemia Mother    Colon cancer Father 47   Colon polyps Brother        gets colonoscopy every 2-3 years   Hyperlipidemia Brother    Hypertension Brother    Uterine cancer Maternal Aunt        dx in her 61s   Colon cancer Maternal Uncle    Colon cancer Paternal Aunt    Heart attack Maternal Grandfather    Cancer Other        MGMs sister - "abdominal cancer"   Breast cancer Cousin    Social History   Social History Narrative   Married. Two children.    College grad.    Former smoker.    Exercises routinely.    Drink caffeine.    Wears a hearing aid.    Smoke alarm in the home.    Wears her seatbelt.    Feels safe in her relationships.     Allergies as of 12/23/2022       Reactions   Tetracycline Hcl Rash            Medication List        Accurate as of December 23, 2022 11:20 AM. If you have any questions, ask your nurse or doctor.          acebutolol 200 MG  capsule Commonly known as: SECTRAL TAKE 1 CAPSULE BY MOUTH TWICE A DAY   amLODipine 5 MG tablet Commonly known as: NORVASC Take 1 tablet (5 mg total) by mouth daily.   apixaban 5 MG Tabs tablet Commonly known as: ELIQUIS Take 1 tablet (5 mg total) by mouth 2 (two) times daily.   atorvastatin 40 MG tablet Commonly known as: LIPITOR Take 1 tablet (40 mg total) by mouth daily.   Azelastine HCl 137 MCG/SPRAY Soln Place into both nostrils.   chlorpheniramine 4 MG tablet Commonly known as: CHLOR-TRIMETON Take 4 mg by mouth every 4 (four) hours as needed for allergies.   DIALYVITE VITAMIN D 5000 PO Take by mouth.   estradiol 0.0375 MG/24HR Commonly known as: VIVELLE-DOT Place 1 patch onto the skin 2 (two) times a week.   gabapentin 300 MG capsule Commonly known as: NEURONTIN Take 1 capsule (300 mg total) by mouth 2 (two) times daily. What changed:  when to take this additional instructions Changed by: Felix Pacini   levothyroxine 88 MCG tablet Commonly known as: SYNTHROID Take 1 tablet (88 mcg total) by mouth daily.   losartan 25 MG tablet Commonly known as: COZAAR Take 0.5 tablets (12.5 mg total) by mouth daily.   MAGNESIUM PO Take by mouth.   progesterone 100 MG capsule Commonly known as: PROMETRIUM Take 100 mg by mouth in the morning and at bedtime. TAKING DAILY   traZODone 50 MG tablet Commonly known as: DESYREL Take 0.5-1 tablets (25-50 mg total) by mouth at bedtime as needed for sleep. Take half tablet by mouth as needed at bedtime   VITAMIN K PO Take by mouth.        All past medical history, surgical history, allergies, family history, immunizations andmedications were updated in the EMR today and reviewed under the history and medication portions of their EMR.     No results found for this or  any previous visit (from the past 2160 hour(s)).     ROS 14 pt review of systems performed and negative (unless mentioned in an HPI)  Objective: BP  120/63   Pulse 62   Temp 98 F (36.7 C)   Wt 127 lb 9.6 oz (57.9 kg)   SpO2 96%   BMI 21.23 kg/m  Physical Exam Vitals and nursing note reviewed.  Constitutional:      General: She is not in acute distress.    Appearance: Normal appearance. She is not ill-appearing, toxic-appearing or diaphoretic.  HENT:     Head: Normocephalic and atraumatic.  Eyes:     General: No scleral icterus.       Right eye: No discharge.        Left eye: No discharge.     Extraocular Movements: Extraocular movements intact.     Conjunctiva/sclera: Conjunctivae normal.     Pupils: Pupils are equal, round, and reactive to light.  Cardiovascular:     Rate and Rhythm: Normal rate and regular rhythm.     Heart sounds: No murmur heard. Pulmonary:     Effort: Pulmonary effort is normal. No respiratory distress.     Breath sounds: Normal breath sounds. No wheezing, rhonchi or rales.  Musculoskeletal:     Right lower leg: No edema.     Left lower leg: No edema.  Skin:    General: Skin is warm.     Findings: No rash.  Neurological:     Mental Status: She is alert and oriented to person, place, and time. Mental status is at baseline.     Motor: No weakness.     Gait: Gait normal.  Psychiatric:        Mood and Affect: Mood normal.        Behavior: Behavior normal.        Thought Content: Thought content normal.        Judgment: Judgment normal.      No results found.  Assessment/plan: Yvette Short is a 77 y.o. female present for CPE and Chronic Conditions/illness Management HTN/a.fib/Mixed hyperlipidemia/aortic atherosclerosis/AR/MR/CAD-h/o stroke/aquired thrombophilia-eliquis stable Continue amlodipine 5 mg daily Continue losartan 12.5 mg daily Continue Lipitor 40 nightly Continue  sectral and eliquis prescribed by cardiology.  CBC and CMP collected today F/u 5.5 mos cmc  Sleep disorder/OSA: Had been struggling with CPAP use, but compliant Working well, Trazodone 25-50 mg qd.    Hypothyroidism due to acquired atrophy of thyroid Continue levothyroxine 88 mcg daily Labs due next visit.    Chronic cough/GERD Agreed to take over gabapentin 300 mg BID Pt reports no sedation affects  Memory deficit MMSE 30/30 MMSE 30/30 today in the office Ok to test yearly for patient    Return in about 6 months (around 07/08/2023) for cpe (20 min), Routine chronic condition follow-up.   Orders Placed This Encounter  Procedures   TSH   CBC   Comp Met (CMET)   Meds ordered this encounter  Medications   amLODipine (NORVASC) 5 MG tablet    Sig: Take 1 tablet (5 mg total) by mouth daily.    Dispense:  90 tablet    Refill:  2   losartan (COZAAR) 25 MG tablet    Sig: Take 0.5 tablets (12.5 mg total) by mouth daily.    Dispense:  45 tablet    Refill:  2   traZODone (DESYREL) 50 MG tablet    Sig: Take 0.5-1 tablets (25-50 mg total) by mouth at bedtime  as needed for sleep. Take half tablet by mouth as needed at bedtime    Dispense:  90 tablet    Refill:  1   atorvastatin (LIPITOR) 40 MG tablet    Sig: Take 1 tablet (40 mg total) by mouth daily.    Dispense:  90 tablet    Refill:  3   DISCONTD: gabapentin (NEURONTIN) 300 MG capsule    Sig: Take 1 capsule (300 mg total) by mouth 2 (two) times daily. 3-4 times per day    Dispense:  180 capsule    Refill:  1   gabapentin (NEURONTIN) 300 MG capsule    Sig: Take 1 capsule (300 mg total) by mouth 2 (two) times daily.    Dispense:  180 capsule    Refill:  1   Referral Orders  No referral(s) requested today     Electronically signed by: Felix Pacini, DO [facility] [place]

## 2022-12-23 NOTE — Patient Instructions (Addendum)
Return in about 6 months (around 07/08/2023) for cpe (20 min), Routine chronic condition follow-up.        Great to see you today.  I have refilled the medication(s) we provide.   If labs were collected or images ordered, we will inform you of  results once we have received them and reviewed. We will contact you either by echart message, or telephone call.  Please give ample time to the testing facility, and our office to run,  receive and review results. Please do not call inquiring of results, even if you can see them in your chart. We will contact you as soon as we are able. If it has been over 1 week since the test was completed, and you have not yet heard from [place], then please call us.    - echart message- for normal results that have been seen by the patient already.   - telephone call: abnormal results or if patient has not viewed results in their echart.  If a referral to a specialist was entered for you, please call us in 2 weeks if you have not heard from the specialist office to schedule.

## 2022-12-28 ENCOUNTER — Other Ambulatory Visit: Payer: Self-pay | Admitting: Internal Medicine

## 2022-12-28 DIAGNOSIS — I4821 Permanent atrial fibrillation: Secondary | ICD-10-CM

## 2022-12-29 NOTE — Telephone Encounter (Signed)
Eliquis 5mg  refill request received. Patient is 77 years old, weight-57.9kg, Crea-1.02 on 12/23/22, Diagnosis-Afib, and last seen by Will Camnitz on 08/06/22. Dose is appropriate based on dosing criteria. Will send in refill to requested pharmacy.

## 2023-01-01 DIAGNOSIS — G4733 Obstructive sleep apnea (adult) (pediatric): Secondary | ICD-10-CM | POA: Diagnosis not present

## 2023-01-14 ENCOUNTER — Ambulatory Visit: Payer: Medicare PPO | Admitting: Family Medicine

## 2023-01-14 ENCOUNTER — Encounter: Payer: Self-pay | Admitting: Family Medicine

## 2023-01-14 VITALS — BP 120/82 | HR 54 | Temp 97.7°F | Wt 127.0 lb

## 2023-01-14 DIAGNOSIS — F4323 Adjustment disorder with mixed anxiety and depressed mood: Secondary | ICD-10-CM

## 2023-01-14 DIAGNOSIS — G479 Sleep disorder, unspecified: Secondary | ICD-10-CM | POA: Diagnosis not present

## 2023-01-14 MED ORDER — ESCITALOPRAM OXALATE 10 MG PO TABS: 10.0000 mg | ORAL_TABLET | Freq: Every day | ORAL | 1 refills | Status: DC

## 2023-01-14 NOTE — Patient Instructions (Signed)

## 2023-01-14 NOTE — Progress Notes (Signed)
Patient ID: Yvette Short, female  DOB: 12/15/1945, 77 y.o.   MRN: [id number] Patient Care Team    Relationship Specialty Notifications Start End  Natalia Leatherwood, DO PCP - General Family Medicine  08/03/17   Thomasene Ripple, DO PCP - Cardiology Cardiology  11/25/21   Marcelle Overlie, MD Consulting Physician Obstetrics and Gynecology  08/03/17   Nyoka Cowden, MD Consulting Physician Pulmonary Disease  08/03/17   Judi Saa, DO Consulting Physician Sports Medicine  08/03/17   Willa Rough, MD Referring Physician Gastroenterology  08/03/17   Blima Ledger, OD  Optometry  08/03/17   Thomasene Ripple, DO Consulting Physician Cardiology  06/30/21     Chief Complaint  Patient presents with   Depression    Subjective: Yvette Short is a 77 y.o.  Female  present for increased in worry.  All past medical history, surgical history, allergies, family history, immunizations, medications and social history were updated in the electronic medical record today. All recent labs, ED visits and hospitalizations within the last year were reviewed.  Mild depression and worry:pt reports she feels she is worrying more about her health and future. She endorses feeling down in the dumps. She is not sleeping well. Trazodone helps some. She feels her bad days are now outweighing her good days.  She had been on effexor a few years ago, mostly for MSK complaints, but she noticed a positive effect in her emotions on medication. She reports she had a hard time when she tried to come off that medication.      01/14/2023   10:14 AM 12/23/2022    8:24 AM 08/25/2022    3:24 PM 07/06/2022    8:20 AM 06/02/2021    1:16 PM  Depression screen PHQ 2/9  Decreased Interest 1 0 0 0 0  Down, Depressed, Hopeless 1 1 0 0 0  PHQ - 2 Score 2 1 0 0 0  Altered sleeping 2      Tired, decreased energy 2      Change in appetite 1      Feeling bad or failure about yourself  1      Trouble concentrating 3      Moving slowly or  fidgety/restless 0      Suicidal thoughts 0      PHQ-9 Score 11      Difficult doing work/chores Somewhat difficult          01/14/2023   10:15 AM 06/26/2020    8:48 AM 12/25/2019    8:46 AM  GAD 7 : Generalized Anxiety Score  Nervous, Anxious, on Edge 2 1 0  Control/stop worrying 1 0 0  Worry too much - different things 1 0 0  Trouble relaxing 1 0 0  Restless 0 0 0  Easily annoyed or irritable 1 1 1   Afraid - awful might happen 1 0 0  Total GAD 7 Score 7 2 1   Anxiety Difficulty Somewhat difficult      Immunization History  Administered Date(s) Administered   Fluad Quad(high Dose 65+) 12/25/2019, 11/30/2021   Influenza Split 12/21/2010, 12/21/2011   Influenza Whole 01/04/2008, 12/31/2008, 01/14/2010   Influenza, High Dose Seasonal PF 01/03/2013, 12/26/2013, 02/12/2016, 12/28/2016, 12/05/2020, 12/01/2022   Influenza-Unspecified 12/06/2017   PFIZER Comirnaty(Gray Top)Covid-19 Tri-Sucrose Vaccine 12/23/2021   PFIZER(Purple Top)SARS-COV-2 Vaccination 04/22/2019, 05/14/2019, 01/25/2020   Pfizer Covid-19 Vaccine Bivalent Booster 46yrs & up 12/05/2020   Pfizer(Comirnaty)Fall Seasonal Vaccine 12 years and older 12/01/2022  Pneumococcal Conjugate-13 02/16/2016   Pneumococcal Polysaccharide-23 04/10/2012   Respiratory Syncytial Virus Vaccine,Recomb Aduvanted(Arexvy) 12/23/2021   Td 03/09/2001, 12/10/2008   Tdap 12/10/2008, 12/28/2016   Zoster Recombinant(Shingrix) 08/03/2017, 12/29/2017, 12/29/2017   Zoster, Live 12/21/2010     Past Medical History:  Diagnosis Date   Abnormal colonoscopy 03/2017   decreased rectal tone and polyp; 5 year recall   Abnormal findings on esophagogastroduodenoscopy (EGD) 02/25/2016   Z-line irrg. at 40 cm. No abnomrality of the esophagus to explain dysphagia. Esophagus dilated. H/o + H. Pylori   Allergy    ANXIETY 07/06/2007   Aortic atherosclerosis (HCC) 02/08/2018   Aortic valve regurgitation    Atherosclerosis of native coronary artery of  native heart without angina pectoris 02/08/2018   Biceps tendonitis on right    Bronchiectasis without complication (HCC) 03/16/2011   CT 2012 IMPRESSION:  1. Apical scarring may account for the plain film abnormality.  2. A 6 mm right upper lobe pulmonary nodule.  - - HRCT 07/12/2017 >>>  Scattered minimal cylindrical and varicoid bronchiectasis with associated scattered mucoid impaction and minimal tree-in-bud opacity in both lungs, predominantly in the mid to lower lungs. Findings are stable to slightly worsened since 2015 ches   Chicken pox    Chronic cough 05/18/2017   CT chest 03/19/14  No bronchiectasis (not hrct)  Spirometry 05/18/2017  FEV1  2.15 (93%)  Ratio 76 s rx prior  - FENO 05/18/2017  =   Could not perform   - Allergy profile 05/18/2017 >  Eos 0.2 /  IgE  75 RAST pos cat > dog > mold - HRCT 07/12/2017 >>>  See bronchiectasis - trial of dymista 07/26/2017 > some better but not consistent with it > rechallenge rx one puff each am 10/25/2017  - MCT 11/02/17 >     Colon polyp 03/31/2017   hyperplastic   COVID-19 08/04/2020   mild   Diverticulosis 03/2017   sigmoid   Family history of colon cancer 04/03/2017   Formatting of this note might be different from the original. 1/19 colon - no adenomas Recommend screening exam in 1/24 - [facility]   Family history of ovarian cancer    Genetic testing 03/26/2016   Negative genetic testing on the [facility] 30 gene panel.  The 30-Gene Cancer Panel offered by [facility] includes sequencing and/or deletion duplication testing of the following 30 genes: APC, ATM, BAP1, BARD1, BMPR1A, BRCA1, BRCA2, BRIP1, CDH1, CDK4, CDKN2A (p14ARF), CDKN2A (p16INK4a), CHEK2, EPCAM, GREM1, MITF, MLH1, MSH2, MSH6, MUTYH, NBN, PALB2, PMS2, POLD1, POLE, PTEN, RAD51C, RAD51D, SMAD4,    GERD 11/09/2006   H. pylori infection    h/o treated wiht prevpac   Hormone replacement therapy 01/08/2019   HYPERLIPIDEMIA 11/09/2006   Hypertension    Hypothyroidism 08/03/2017   LOC (loss  of consciousness) (HCC) 01/26/2022   Lumbar radiculopathy 07/24/2014   Migraine    Mild depression 02/14/2019   Pharyngoesophageal dysphagia 08/03/2017   Piriformis syndrome of right side    Spinal stenosis, lumbar region, with neurogenic claudication 10/16/2014   Epidural 10/02/2014, December 2016    Stomach ulcer 2008   Allergies  Allergen Reactions   Tetracycline Hcl Rash        Past Surgical History:  Procedure Laterality Date   CATARACT EXTRACTION Bilateral 04/2021   CERVICAL POLYPECTOMY  01/17/2019   LIPOMA EXCISION     of back   TUBAL LIGATION     Family History  Problem Relation Age of Onset   Dementia Mother  Hyperlipidemia Mother    Colon cancer Father 81   Colon polyps Brother        gets colonoscopy every 2-3 years   Hyperlipidemia Brother    Hypertension Brother    Uterine cancer Maternal Aunt        dx in her 67s   Colon cancer Maternal Uncle    Colon cancer Paternal Aunt    Heart attack Maternal Grandfather    Cancer Other        MGMs sister - "abdominal cancer"   Breast cancer Cousin    Social History   Social History Narrative   Married. Two children.    College grad.    Former smoker.    Exercises routinely.    Drink caffeine.    Wears a hearing aid.    Smoke alarm in the home.    Wears her seatbelt.    Feels safe in her relationships.     Allergies as of 01/14/2023       Reactions   Tetracycline Hcl Rash            Medication List        Accurate as of January 14, 2023 10:54 AM. If you have any questions, ask your nurse or doctor.          acebutolol 200 MG capsule Commonly known as: SECTRAL TAKE 1 CAPSULE BY MOUTH TWICE A DAY   amLODipine 5 MG tablet Commonly known as: NORVASC Take 1 tablet (5 mg total) by mouth daily.   atorvastatin 40 MG tablet Commonly known as: LIPITOR Take 1 tablet (40 mg total) by mouth daily.   Azelastine HCl 137 MCG/SPRAY Soln Place into both nostrils.   chlorpheniramine 4 MG  tablet Commonly known as: CHLOR-TRIMETON Take 4 mg by mouth every 4 (four) hours as needed for allergies.   DIALYVITE VITAMIN D 5000 PO Take by mouth.   Eliquis 5 MG Tabs tablet Generic drug: apixaban TAKE 1 TABLET BY MOUTH TWICE A DAY   escitalopram 10 MG tablet Commonly known as: Lexapro Take 1 tablet (10 mg total) by mouth daily. Started by: Felix Pacini   estradiol 0.0375 MG/24HR Commonly known as: VIVELLE-DOT Place 1 patch onto the skin 2 (two) times a week.   gabapentin 300 MG capsule Commonly known as: NEURONTIN Take 1 capsule (300 mg total) by mouth 2 (two) times daily.   levothyroxine 88 MCG tablet Commonly known as: SYNTHROID Take 1 tablet (88 mcg total) by mouth daily.   losartan 25 MG tablet Commonly known as: COZAAR Take 0.5 tablets (12.5 mg total) by mouth daily.   MAGNESIUM PO Take by mouth.   progesterone 100 MG capsule Commonly known as: PROMETRIUM Take 100 mg by mouth in the morning and at bedtime. TAKING DAILY   traZODone 50 MG tablet Commonly known as: DESYREL Take 0.5-1 tablets (25-50 mg total) by mouth at bedtime as needed for sleep. Take half tablet by mouth as needed at bedtime   VITAMIN K PO Take by mouth.        All past medical history, surgical history, allergies, family history, immunizations andmedications were updated in the EMR today and reviewed under the history and medication portions of their EMR.       ROS 14 pt review of systems performed and negative (unless mentioned in an HPI)  Objective: BP 120/82   Pulse (!) 54   Temp 97.7 F (36.5 C)   Wt 127 lb (57.6 kg)   SpO2 97%  BMI 21.13 kg/m  Physical Exam Vitals and nursing note reviewed.  Constitutional:      General: She is not in acute distress.    Appearance: Normal appearance. She is normal weight. She is not ill-appearing or toxic-appearing.  HENT:     Head: Normocephalic and atraumatic.  Eyes:     General: No scleral icterus.       Right eye: No  discharge.        Left eye: No discharge.     Extraocular Movements: Extraocular movements intact.     Conjunctiva/sclera: Conjunctivae normal.     Pupils: Pupils are equal, round, and reactive to light.  Skin:    Findings: No rash.  Neurological:     Mental Status: She is alert and oriented to person, place, and time. Mental status is at baseline.     Motor: No weakness.     Coordination: Coordination normal.     Gait: Gait normal.  Psychiatric:        Mood and Affect: Mood normal.        Behavior: Behavior normal.        Thought Content: Thought content normal.        Judgment: Judgment normal.      No results found.  Assessment/plan: Yvette Short is a 77 y.o. female present for  Adjustment reaction with anxiety and depression/Sleep disorder Discussed different tx plans with pt today. She would like to start a daily medicine.  Start lexapro 10 mg every day.  Had been struggling with CPAP use, but compliant Continue  Trazodone 25-50 mg qd.      Return in about 6 weeks (around 02/28/2023) for Routine chronic condition follow-up.   No orders of the defined types were placed in this encounter.  Meds ordered this encounter  Medications   escitalopram (LEXAPRO) 10 MG tablet    Sig: Take 1 tablet (10 mg total) by mouth daily.    Dispense:  90 tablet    Refill:  1   Referral Orders  No referral(s) requested today     Electronically signed by: Felix Pacini, DO [facility] [place]

## 2023-01-28 ENCOUNTER — Telehealth: Payer: Self-pay

## 2023-01-28 NOTE — Telephone Encounter (Signed)
Called and spoke to patient she is scheduled for a tele visit on 02/08/23 at 1:40 patient voiced understanding med rec and consent done.. SN     Patient Consent for Virtual Visit        SHAUNTAY BRUNELLI has provided verbal consent on 01/28/2023 for a virtual visit (video or telephone).   CONSENT FOR VIRTUAL VISIT FOR:  Arby Barrette  By participating in this virtual visit I agree to the following:  I hereby voluntarily request, consent and authorize [facility] and its employed or contracted physicians, physician assistants, nurse practitioners or other licensed health care professionals (the Practitioner), to provide me with telemedicine health care services (the "Services") as deemed necessary by the treating Practitioner. I acknowledge and consent to receive the Services by the Practitioner via telemedicine. I understand that the telemedicine visit will involve communicating with the Practitioner through live audiovisual communication technology and the disclosure of certain medical information by electronic transmission. I acknowledge that I have been given the opportunity to request an in-person assessment or other available alternative prior to the telemedicine visit and am voluntarily participating in the telemedicine visit.  I understand that I have the right to withhold or withdraw my consent to the use of telemedicine in the course of my care at any time, without affecting my right to future care or treatment, and that the Practitioner or I may terminate the telemedicine visit at any time. I understand that I have the right to inspect all information obtained and/or recorded in the course of the telemedicine visit and may receive copies of available information for a reasonable fee.  I understand that some of the potential risks of receiving the Services via telemedicine include:  Delay or interruption in medical evaluation due to technological equipment failure or  disruption; Information transmitted may not be sufficient (e.g. poor resolution of images) to allow for appropriate medical decision making by the Practitioner; and/or  In rare instances, security protocols could fail, causing a breach of personal health information.  Furthermore, I acknowledge that it is my responsibility to provide information about my medical history, conditions and care that is complete and accurate to the best of my ability. I acknowledge that Practitioner's advice, recommendations, and/or decision may be based on factors not within their control, such as incomplete or inaccurate data provided by me or distortions of diagnostic images or specimens that may result from electronic transmissions. I understand that the practice of medicine is not an exact science and that Practitioner makes no warranties or guarantees regarding treatment outcomes. I acknowledge that a copy of this consent can be made available to me via my patient portal [facility] MyChart), or I can request a printed copy by calling the office of  [facility].    I understand that my insurance will be billed for this visit.   I have read or had this consent read to me. I understand the contents of this consent, which adequately explains the benefits and risks of the Services being provided via telemedicine.  I have been provided ample opportunity to ask questions regarding this consent and the Services and have had my questions answered to my satisfaction. I give my informed consent for the services to be provided through the use of telemedicine in my medical care

## 2023-01-28 NOTE — Telephone Encounter (Signed)
Called and spoke to patient she is scheduled for a tele visit on 02/08/23 at 1:40 patient voiced understanding med rec and consent done.Marland Kitchen SN

## 2023-01-28 NOTE — Telephone Encounter (Signed)
   Name: Yvette Short  DOB: [birth year]  MRN: [id number]  Primary Cardiologist: Thomasene Ripple, DO   Preoperative team, please contact this patient and set up a phone call appointment for further preoperative risk assessment. Please obtain consent and complete medication review. Thank you for your help.  I confirm that guidance regarding antiplatelet and oral anticoagulation therapy has been completed and, if necessary, noted below.  Per pharm D, "Patient will be high risk off anticoagulation and has a history of CVA. Request is for 2 day Eliquis hold. Recommend holding Eliquis 1 day. If physician not comfortable with one day hold will loop in MD."  I also confirmed the patient resides in the state of [place]. As per [facility] Medical Board telemedicine laws, the patient must reside in the state in which the provider is licensed.   Carlos Levering, NP 01/28/2023, 12:30 PM [facility]

## 2023-01-28 NOTE — Telephone Encounter (Signed)
Please advise holding Eliquis prior to colonoscopy on 02/11/2023.  Thank you!  DW

## 2023-01-28 NOTE — Telephone Encounter (Signed)
   Pre-operative Risk Assessment    Patient Name: Yvette Short  DOB: 08-Dec-1945 MRN: [id number]  Last office visit : 08/06/2022 Upcoming office visit : 04/05/2023      Request for Surgical Clearance    Procedure:   Colonscopy  Date of Surgery:  Clearance 02/11/23                                 Surgeon:  Clance Boll, MD Surgeon's Group or Practice Name:  [facility] [place], [place]  Phone number:  [phone] Fax number:  [phone]   Type of Clearance Requested:   - Medical  - Pharmacy:  Hold Apixaban (Eliquis) : discontinue 2 days  prior to procedure - Last day needs to be 02/08/2023   Type of Anesthesia:  Not Indicated   Additional requests/questions:    [name]   01/28/2023, 10:01 AM

## 2023-01-28 NOTE — Telephone Encounter (Signed)
Patient with diagnosis of A fib on Eliquis for anticoagulation.    Procedure: 02/11/23 Date of procedure: colonoscopy   CHA2DS2-VASc Score = 7  This indicates a 11.2% annual risk of stroke. The patient's score is based upon: CHF History: 0 HTN History: 1 Diabetes History: 0 Stroke History: 2 Vascular Disease History: 1 Age Score: 2 Gender Score: 1   CrCl 43 ml/min Platelet count 263K  Patient will be high risk off anticoagulation and has a history of CVA. Request is for 2 day Eliquis hold. Recommend holding Eliquis 1 day. If physician not comfortable with one day hold will loop in MD.   **This guidance is not considered finalized until pre-operative APP has relayed final recommendations.**

## 2023-02-01 DIAGNOSIS — G4733 Obstructive sleep apnea (adult) (pediatric): Secondary | ICD-10-CM | POA: Diagnosis not present

## 2023-02-07 NOTE — Progress Notes (Unsigned)
Virtual Visit via Telephone Note   Because of Yvette Short's co-morbid illnesses, she is at least at moderate risk for complications without adequate follow up.  This format is felt to be most appropriate for this patient at this time.  The patient did not have access to video technology/had technical difficulties with video requiring transitioning to audio format only (telephone).  All issues noted in this document were discussed and addressed.  No physical exam could be performed with this format.  Please refer to the patient's chart for her consent to telehealth for [facility].  Evaluation Performed:  Preoperative cardiovascular risk assessment _____________   Date:  02/08/2023   Patient ID:  Yvette Short, DOB 01-30-1946, MRN [id number] Patient Location:  Home Provider location:   Office  Primary Care Provider:  Natalia Leatherwood, DO Primary Cardiologist:  Thomasene Ripple, DO  Chief Complaint / Patient Profile   77 y.o. y/o female with a h/o hypertension, hyperlipidemia, hypothyroidism, moderate coronary artery disease by coronary CTA, frequent PACs and PVCs, moderate mitral regurgitation, moderate aortic regurgitation   She is pending a colonoscopy with Dr. Myra Gianotti, of [facility]  on 02/11/2023 and presents today for telephonic preoperative cardiovascular risk assessment.  History of Present Illness    Yvette Short is a 77 y.o. female who presents via audio/video conferencing for a telehealth visit today.  Pt was last seen in cardiology clinic on 08/06/2022 by Dr.Camnitz, EP and on 07/08/2022 by Dr.Tobb.  At that time Yvette Short was doing well.   The patient is now pending procedure as outlined above. Since her last visit, she has been doing well from a cardiac perspective.  She denies any cardiac symptoms such as chest pain, shortness of breath, dizziness or profound fatigue.  She remains active but is not doing any  significant strenuous activity currently.  She is medically compliant.  Past Medical History    Past Medical History:  Diagnosis Date   Abnormal colonoscopy 03/2017   decreased rectal tone and polyp; 5 year recall   Abnormal findings on esophagogastroduodenoscopy (EGD) 02/25/2016   Z-line irrg. at 40 cm. No abnomrality of the esophagus to explain dysphagia. Esophagus dilated. H/o + H. Pylori   Allergy    ANXIETY 07/06/2007   Aortic atherosclerosis (HCC) 02/08/2018   Aortic valve regurgitation    Atherosclerosis of native coronary artery of native heart without angina pectoris 02/08/2018   Biceps tendonitis on right    Bronchiectasis without complication (HCC) 03/16/2011   CT 2012 IMPRESSION:  1. Apical scarring may account for the plain film abnormality.  2. A 6 mm right upper lobe pulmonary nodule.  - - HRCT 07/12/2017 >>>  Scattered minimal cylindrical and varicoid bronchiectasis with associated scattered mucoid impaction and minimal tree-in-bud opacity in both lungs, predominantly in the mid to lower lungs. Findings are stable to slightly worsened since 2015 ches   Chicken pox    Chronic cough 05/18/2017   CT chest 03/19/14  No bronchiectasis (not hrct)  Spirometry 05/18/2017  FEV1  2.15 (93%)  Ratio 76 s rx prior  - FENO 05/18/2017  =   Could not perform   - Allergy profile 05/18/2017 >  Eos 0.2 /  IgE  75 RAST pos cat > dog > mold - HRCT 07/12/2017 >>>  See bronchiectasis - trial of dymista 07/26/2017 > some better but not consistent with it > rechallenge rx one puff each am 10/25/2017  - MCT 11/02/17 >  Colon polyp 03/31/2017   hyperplastic   COVID-19 08/04/2020   mild   Diverticulosis 03/2017   sigmoid   Family history of colon cancer 04/03/2017   Formatting of this note might be different from the original. 1/19 colon - no adenomas Recommend screening exam in 1/24 - [facility]   Family history of ovarian cancer    Genetic testing 03/26/2016   Negative genetic testing on the [facility] 30  gene panel.  The 30-Gene Cancer Panel offered by [facility] includes sequencing and/or deletion duplication testing of the following 30 genes: APC, ATM, BAP1, BARD1, BMPR1A, BRCA1, BRCA2, BRIP1, CDH1, CDK4, CDKN2A (p14ARF), CDKN2A (p16INK4a), CHEK2, EPCAM, GREM1, MITF, MLH1, MSH2, MSH6, MUTYH, NBN, PALB2, PMS2, POLD1, POLE, PTEN, RAD51C, RAD51D, SMAD4,    GERD 11/09/2006   H. pylori infection    h/o treated wiht prevpac   Hormone replacement therapy 01/08/2019   HYPERLIPIDEMIA 11/09/2006   Hypertension    Hypothyroidism 08/03/2017   LOC (loss of consciousness) (HCC) 01/26/2022   Lumbar radiculopathy 07/24/2014   Migraine    Mild depression 02/14/2019   Pharyngoesophageal dysphagia 08/03/2017   Piriformis syndrome of right side    Spinal stenosis, lumbar region, with neurogenic claudication 10/16/2014   Epidural 10/02/2014, December 2016    Stomach ulcer 2008   Past Surgical History:  Procedure Laterality Date   CATARACT EXTRACTION Bilateral 04/2021   CERVICAL POLYPECTOMY  01/17/2019   LIPOMA EXCISION     of back   TUBAL LIGATION      Allergies  Allergies  Allergen Reactions   Tetracycline Hcl Rash         Home Medications    Prior to Admission medications   Medication Sig Start Date End Date Taking? Authorizing Provider  acebutolol (SECTRAL) 200 MG capsule TAKE 1 CAPSULE BY MOUTH TWICE A DAY Patient taking differently: Take 200 mg by mouth daily. 04/27/22   Tobb, Kardie, DO  amLODipine (NORVASC) 5 MG tablet Take 1 tablet (5 mg total) by mouth daily. 12/23/22   Kuneff, Renee A, DO  atorvastatin (LIPITOR) 40 MG tablet Take 1 tablet (40 mg total) by mouth daily. 12/23/22   Kuneff, Renee A, DO  Azelastine HCl 137 MCG/SPRAY SOLN Place into both nostrils. 12/30/21   [provider]  chlorpheniramine (CHLOR-TRIMETON) 4 MG tablet Take 4 mg by mouth every 4 (four) hours as needed for allergies.    [provider]  Cholecalciferol (DIALYVITE VITAMIN D 5000 PO)  Take by mouth.    [provider]  ELIQUIS 5 MG TABS tablet TAKE 1 TABLET BY MOUTH TWICE A DAY 12/29/22   Tobb, Kardie, DO  escitalopram (LEXAPRO) 10 MG tablet Take 1 tablet (10 mg total) by mouth daily. 01/14/23   Kuneff, Renee A, DO  estradiol (VIVELLE-DOT) 0.0375 MG/24HR Place 1 patch onto the skin 2 (two) times a week.    [provider]  gabapentin (NEURONTIN) 300 MG capsule Take 1 capsule (300 mg total) by mouth 2 (two) times daily. 12/23/22   Kuneff, Renee A, DO  levothyroxine (SYNTHROID) 88 MCG tablet Take 1 tablet (88 mcg total) by mouth daily. 07/07/22   Kuneff, Renee A, DO  losartan (COZAAR) 25 MG tablet Take 0.5 tablets (12.5 mg total) by mouth daily. 12/23/22   Kuneff, Renee A, DO  MAGNESIUM PO Take by mouth.    [provider]  progesterone (PROMETRIUM) 100 MG capsule Take 100 mg by mouth in the morning and at bedtime. TAKING DAILY    [provider]  traZODone (DESYREL) 50 MG tablet Take 0.5-1 tablets (25-50 mg total) by mouth at bedtime as needed for sleep. Take half tablet by mouth as needed at bedtime 12/23/22   Kuneff, Renee A, DO  VITAMIN K PO Take by mouth.    [provider]    Physical Exam    Vital Signs:  Yvette Short does not have vital signs available for review today.  Given telephonic nature of communication, physical exam is limited. AAOx3. NAD. Normal affect.  Speech and respirations are unlabored.  Accessory Clinical Findings    None  Assessment & Plan    1.  Preoperative Cardiovascular Risk Assessment:  According to the Revised Cardiac Risk Index (RCRI), her Perioperative Risk of Major Cardiac Event is (%): 0.4  Her Functional Capacity in METs is: 9.89 according to the [facility] Activity Status Index (DASI).   The patient was advised that if she develops new symptoms prior to surgery to contact our office to arrange for a follow-up visit, and she verbalized understanding.  Patient will be high risk off  anticoagulation and has a history of CVA. Request is for 2 day Eliquis hold. Recommend holding Eliquis 1 day. If physician not comfortable with one day hold will loop in Dr. Servando Salina to discuss with Dr. Debbra Riding. Dr. Debbra Riding to reach out.   A copy of this note will be routed to requesting surgeon.  Time:   Today, I have spent 10 minutes with the patient with telehealth technology discussing medical history, symptoms, and management plan.     Joni Reining, NP  02/08/2023, 1:40 PM

## 2023-02-08 ENCOUNTER — Ambulatory Visit: Payer: Medicare PPO | Attending: Cardiology

## 2023-02-08 DIAGNOSIS — Z01818 Encounter for other preprocedural examination: Secondary | ICD-10-CM

## 2023-02-08 DIAGNOSIS — Z01810 Encounter for preprocedural cardiovascular examination: Secondary | ICD-10-CM | POA: Diagnosis not present

## 2023-02-11 DIAGNOSIS — K6389 Other specified diseases of intestine: Secondary | ICD-10-CM | POA: Diagnosis not present

## 2023-02-11 DIAGNOSIS — D122 Benign neoplasm of ascending colon: Secondary | ICD-10-CM | POA: Diagnosis not present

## 2023-02-11 DIAGNOSIS — Z1211 Encounter for screening for malignant neoplasm of colon: Secondary | ICD-10-CM | POA: Diagnosis not present

## 2023-02-11 DIAGNOSIS — Z800 Family history of malignant neoplasm of digestive organs: Secondary | ICD-10-CM | POA: Diagnosis not present

## 2023-03-01 ENCOUNTER — Ambulatory Visit: Payer: Medicare PPO | Admitting: Family Medicine

## 2023-03-01 ENCOUNTER — Encounter: Payer: Self-pay | Admitting: Family Medicine

## 2023-03-01 VITALS — BP 138/80 | HR 57 | Temp 98.0°F | Wt 126.0 lb

## 2023-03-01 DIAGNOSIS — I4821 Permanent atrial fibrillation: Secondary | ICD-10-CM | POA: Diagnosis not present

## 2023-03-01 DIAGNOSIS — G4733 Obstructive sleep apnea (adult) (pediatric): Secondary | ICD-10-CM | POA: Diagnosis not present

## 2023-03-01 DIAGNOSIS — D6869 Other thrombophilia: Secondary | ICD-10-CM

## 2023-03-01 DIAGNOSIS — I6389 Other cerebral infarction: Secondary | ICD-10-CM | POA: Diagnosis not present

## 2023-03-01 DIAGNOSIS — E034 Atrophy of thyroid (acquired): Secondary | ICD-10-CM | POA: Diagnosis not present

## 2023-03-01 DIAGNOSIS — I10 Essential (primary) hypertension: Secondary | ICD-10-CM | POA: Diagnosis not present

## 2023-03-01 DIAGNOSIS — K219 Gastro-esophageal reflux disease without esophagitis: Secondary | ICD-10-CM

## 2023-03-01 DIAGNOSIS — E782 Mixed hyperlipidemia: Secondary | ICD-10-CM | POA: Diagnosis not present

## 2023-03-01 DIAGNOSIS — I700 Atherosclerosis of aorta: Secondary | ICD-10-CM

## 2023-03-01 DIAGNOSIS — G479 Sleep disorder, unspecified: Secondary | ICD-10-CM

## 2023-03-01 DIAGNOSIS — F4323 Adjustment disorder with mixed anxiety and depressed mood: Secondary | ICD-10-CM

## 2023-03-01 MED ORDER — LOSARTAN POTASSIUM 25 MG PO TABS: 12.5000 mg | ORAL_TABLET | Freq: Every day | ORAL | 2 refills | Status: DC

## 2023-03-01 MED ORDER — GABAPENTIN 300 MG PO CAPS: 300.0000 mg | ORAL_CAPSULE | Freq: Two times a day (BID) | ORAL | 1 refills | Status: DC

## 2023-03-01 MED ORDER — ESCITALOPRAM OXALATE 10 MG PO TABS: 10.0000 mg | ORAL_TABLET | Freq: Every day | ORAL | 1 refills | Status: DC

## 2023-03-01 MED ORDER — TRAZODONE HCL 50 MG PO TABS: 25.0000 mg | ORAL_TABLET | Freq: Every evening | ORAL | 1 refills | Status: DC | PRN

## 2023-03-01 NOTE — Patient Instructions (Addendum)
Return in about 4 months (around 07/07/2023) for cpe (20 min), Routine chronic condition follow-up.        Great to see you today.  I have refilled the medication(s) we provide.   If labs were collected or images ordered, we will inform you of  results once we have received them and reviewed. We will contact you either by echart message, or telephone call.  Please give ample time to the testing facility, and our office to run,  receive and review results. Please do not call inquiring of results, even if you can see them in your chart. We will contact you as soon as we are able. If it has been over 1 week since the test was completed, and you have not yet heard from [place], then please call us.    - echart message- for normal results that have been seen by the patient already.   - telephone call: abnormal results or if patient has not viewed results in their echart.  If a referral to a specialist was entered for you, please call us in 2 weeks if you have not heard from the specialist office to schedule.

## 2023-03-01 NOTE — Progress Notes (Signed)
Patient ID: Yvette Short, female  DOB: [birth year], 77 y.o.   MRN: [id number] Patient Care Team    Relationship Specialty Notifications Start End  Natalia Leatherwood, DO PCP - General Family Medicine  08/03/17   Thomasene Ripple, DO PCP - Cardiology Cardiology  11/25/21   Marcelle Overlie, MD Consulting Physician Obstetrics and Gynecology  08/03/17   Nyoka Cowden, MD Consulting Physician Pulmonary Disease  08/03/17   Judi Saa, DO Consulting Physician Sports Medicine  08/03/17   Willa Rough, MD Referring Physician Gastroenterology  08/03/17   Blima Ledger, OD  Optometry  08/03/17   Thomasene Ripple, DO Consulting Physician Cardiology  06/30/21     Chief Complaint  Patient presents with   Depression    Subjective: Yvette Short is a 77 y.o.  Female  present for increased in worry.  All past medical history, surgical history, allergies, family history, immunizations, medications and social history were updated in the electronic medical record today. All recent labs, ED visits and hospitalizations within the last year were reviewed.  Mild depression and worry: Patient reports she feels the Lexapro 10 mg has been helpful.  She is taking the trazodone to help with sleep nightly. Prior note: pt reports she feels she is worrying more about her health and future. She endorses feeling down in the dumps. She is not sleeping well. Trazodone helps some. She feels her bad days are now outweighing her good days.  She had been on effexor a few years ago, mostly for MSK complaints, but she noticed a positive effect in her emotions on medication. She reports she had a hard time when she tried to come off that medication.   HTN/a.fib/Mixed hyperlipidemia/aortic atherosclerosis/AR/MR/CAD-h/o stroke Pt reports compliance with losartan12.5 mg qd, amlodipine 5 mg qd, eliquis, lipitor and sectral  Patient denies chest pain, shortness of breath, dizziness or lower extremity edema.  Established with  cardio. RF: [id number] CAD.stroke 08/30/2020-echo: Moderate mitral regurgitation, mild to moderate aortic regurgitation, mild to moderate tricuspid regurgitation. The right-sided pressures - mildly elevated.   Hypothyroidism, unspecified type Compliant with levo 88 mcg qd.    01/14/2023   10:14 AM 12/23/2022    8:24 AM 08/25/2022    3:24 PM 07/06/2022    8:20 AM 06/02/2021    1:16 PM  Depression screen PHQ 2/9  Decreased Interest 1 0 0 0 0  Down, Depressed, Hopeless 1 1 0 0 0  PHQ - 2 Score 2 1 0 0 0  Altered sleeping 2      Tired, decreased energy 2      Change in appetite 1      Feeling bad or failure about yourself  1      Trouble concentrating 3      Moving slowly or fidgety/restless 0      Suicidal thoughts 0      PHQ-9 Score 11      Difficult doing work/chores Somewhat difficult          01/14/2023   10:15 AM 06/26/2020    8:48 AM 12/25/2019    8:46 AM  GAD 7 : Generalized Anxiety Score  Nervous, Anxious, on Edge 2 1 0  Control/stop worrying 1 0 0  Worry too much - different things 1 0 0  Trouble relaxing 1 0 0  Restless 0 0 0  Easily annoyed or irritable 1 1 1   Afraid - awful might happen 1 0 0  Total GAD 7 Score  7 2 1   Anxiety Difficulty Somewhat difficult      Immunization History  Administered Date(s) Administered   Fluad Quad(high Dose 65+) 12/25/2019, 11/30/2021   Influenza Split 12/21/2010, 12/21/2011   Influenza Whole 01/04/2008, 12/31/2008, 01/14/2010   Influenza, High Dose Seasonal PF 01/03/2013, 12/26/2013, 02/12/2016, 12/28/2016, 12/05/2020, 12/01/2022   Influenza-Unspecified 12/06/2017   PFIZER Comirnaty(Gray Top)Covid-19 Tri-Sucrose Vaccine 12/23/2021   PFIZER(Purple Top)SARS-COV-2 Vaccination 04/22/2019, 05/14/2019, 01/25/2020   Pfizer Covid-19 Vaccine Bivalent Booster 43yrs & up 12/05/2020   Pfizer(Comirnaty)Fall Seasonal Vaccine 12 years and older 12/01/2022   Pneumococcal Conjugate-13 02/16/2016   Pneumococcal Polysaccharide-23 04/10/2012    Respiratory Syncytial Virus Vaccine,Recomb Aduvanted(Arexvy) 12/23/2021   Td 03/09/2001, 12/10/2008   Tdap 12/10/2008, 12/28/2016   Zoster Recombinant(Shingrix) 08/03/2017, 12/29/2017, 12/29/2017   Zoster, Live 12/21/2010     Past Medical History:  Diagnosis Date   Abnormal colonoscopy 03/2017   decreased rectal tone and polyp; 5 year recall   Abnormal findings on esophagogastroduodenoscopy (EGD) 02/25/2016   Z-line irrg. at 40 cm. No abnomrality of the esophagus to explain dysphagia. Esophagus dilated. H/o + H. Pylori   Allergy    ANXIETY 07/06/2007   Aortic atherosclerosis (HCC) 02/08/2018   Aortic valve regurgitation    Atherosclerosis of native coronary artery of native heart without angina pectoris 02/08/2018   Biceps tendonitis on right    Bronchiectasis without complication (HCC) 03/16/2011   CT 2012 IMPRESSION:  1. Apical scarring may account for the plain film abnormality.  2. A 6 mm right upper lobe pulmonary nodule.  - - HRCT 07/12/2017 >>>  Scattered minimal cylindrical and varicoid bronchiectasis with associated scattered mucoid impaction and minimal tree-in-bud opacity in both lungs, predominantly in the mid to lower lungs. Findings are stable to slightly worsened since 2015 ches   Chicken pox    Chronic cough 05/18/2017   CT chest 03/19/14  No bronchiectasis (not hrct)  Spirometry 05/18/2017  FEV1  2.15 (93%)  Ratio 76 s rx prior  - FENO 05/18/2017  =   Could not perform   - Allergy profile 05/18/2017 >  Eos 0.2 /  IgE  75 RAST pos cat > dog > mold - HRCT 07/12/2017 >>>  See bronchiectasis - trial of dymista 07/26/2017 > some better but not consistent with it > rechallenge rx one puff each am 10/25/2017  - MCT 11/02/17 >     Colon polyp 03/31/2017   hyperplastic   COVID-19 08/04/2020   mild   Diverticulosis 03/2017   sigmoid   Family history of colon cancer 04/03/2017   Formatting of this note might be different from the original. 1/19 colon - no adenomas Recommend screening  exam in 1/24 - [facility]   Family history of ovarian cancer    Genetic testing 03/26/2016   Negative genetic testing on the [facility] 30 gene panel.  The 30-Gene Cancer Panel offered by [facility] includes sequencing and/or deletion duplication testing of the following 30 genes: APC, ATM, BAP1, BARD1, BMPR1A, BRCA1, BRCA2, BRIP1, CDH1, CDK4, CDKN2A (p14ARF), CDKN2A (p16INK4a), CHEK2, EPCAM, GREM1, MITF, MLH1, MSH2, MSH6, MUTYH, NBN, PALB2, PMS2, POLD1, POLE, PTEN, RAD51C, RAD51D, SMAD4,    GERD 11/09/2006   H. pylori infection    h/o treated wiht prevpac   Hormone replacement therapy 01/08/2019   HYPERLIPIDEMIA 11/09/2006   Hypertension    Hypothyroidism 08/03/2017   LOC (loss of consciousness) (HCC) 01/26/2022   Lumbar radiculopathy 07/24/2014   Migraine    Mild depression 02/14/2019   Pharyngoesophageal dysphagia 08/03/2017  Piriformis syndrome of right side    Spinal stenosis, lumbar region, with neurogenic claudication 10/16/2014   Epidural 10/02/2014, December 2016    Stomach ulcer 2008   Allergies  Allergen Reactions   Tetracycline Hcl Rash        Past Surgical History:  Procedure Laterality Date   CATARACT EXTRACTION Bilateral 04/2021   CERVICAL POLYPECTOMY  01/17/2019   LIPOMA EXCISION     of back   TUBAL LIGATION     Family History  Problem Relation Age of Onset   Dementia Mother    Hyperlipidemia Mother    Colon cancer Father 45   Colon polyps Brother        gets colonoscopy every 2-3 years   Hyperlipidemia Brother    Hypertension Brother    Uterine cancer Maternal Aunt        dx in her 80s   Colon cancer Maternal Uncle    Colon cancer Paternal Aunt    Heart attack Maternal Grandfather    Cancer Other        MGMs sister - "abdominal cancer"   Breast cancer Cousin    Social History   Social History Narrative   Married. Two children.    College grad.    Former smoker.    Exercises routinely.    Drink caffeine.    Wears a hearing aid.    Smoke  alarm in the home.    Wears her seatbelt.    Feels safe in her relationships.     Allergies as of 03/01/2023       Reactions   Tetracycline Hcl Rash            Medication List        Accurate as of March 01, 2023 11:59 PM. If you have any questions, ask your nurse or doctor.          acebutolol 200 MG capsule Commonly known as: SECTRAL TAKE 1 CAPSULE BY MOUTH TWICE A DAY What changed: when to take this   amLODipine 5 MG tablet Commonly known as: NORVASC Take 1 tablet (5 mg total) by mouth daily.   atorvastatin 40 MG tablet Commonly known as: LIPITOR Take 1 tablet (40 mg total) by mouth daily.   Azelastine HCl 137 MCG/SPRAY Soln Place into both nostrils.   chlorpheniramine 4 MG tablet Commonly known as: CHLOR-TRIMETON Take 4 mg by mouth every 4 (four) hours as needed for allergies.   DIALYVITE VITAMIN D 5000 PO Take by mouth.   Eliquis 5 MG Tabs tablet Generic drug: apixaban TAKE 1 TABLET BY MOUTH TWICE A DAY   escitalopram 10 MG tablet Commonly known as: Lexapro Take 1 tablet (10 mg total) by mouth daily.   estradiol 0.0375 MG/24HR Commonly known as: VIVELLE-DOT Place 1 patch onto the skin 2 (two) times a week.   gabapentin 300 MG capsule Commonly known as: NEURONTIN Take 1 capsule (300 mg total) by mouth 2 (two) times daily.   levothyroxine 88 MCG tablet Commonly known as: SYNTHROID Take 1 tablet (88 mcg total) by mouth daily.   losartan 25 MG tablet Commonly known as: COZAAR Take 0.5 tablets (12.5 mg total) by mouth daily.   MAGNESIUM PO Take by mouth.   progesterone 100 MG capsule Commonly known as: PROMETRIUM Take 100 mg by mouth in the morning and at bedtime. TAKING DAILY   traZODone 50 MG tablet Commonly known as: DESYREL Take 0.5-1 tablets (25-50 mg total) by mouth at bedtime as needed for sleep.  Take half tablet by mouth as needed at bedtime   VITAMIN K PO Take by mouth.        All past medical history, surgical  history, allergies, family history, immunizations andmedications were updated in the EMR today and reviewed under the history and medication portions of their EMR.       ROS 14 pt review of systems performed and negative (unless mentioned in an HPI)  Objective: BP 138/80   Pulse (!) 57   Temp 98 F (36.7 C)   Wt 126 lb (57.2 kg)   SpO2 99%   BMI 20.97 kg/m  Physical Exam Vitals and nursing note reviewed.  Constitutional:      General: She is not in acute distress.    Appearance: Normal appearance. She is normal weight. She is not ill-appearing, toxic-appearing or diaphoretic.  HENT:     Head: Normocephalic and atraumatic.  Eyes:     General: No scleral icterus.       Right eye: No discharge.        Left eye: No discharge.     Extraocular Movements: Extraocular movements intact.     Conjunctiva/sclera: Conjunctivae normal.     Pupils: Pupils are equal, round, and reactive to light.  Cardiovascular:     Rate and Rhythm: Normal rate and regular rhythm.  Pulmonary:     Effort: Pulmonary effort is normal. No respiratory distress.     Breath sounds: Normal breath sounds. No wheezing, rhonchi or rales.  Musculoskeletal:     Right lower leg: No edema.     Left lower leg: No edema.  Skin:    General: Skin is warm.     Findings: No rash.  Neurological:     Mental Status: She is alert and oriented to person, place, and time. Mental status is at baseline.     Motor: No weakness.     Coordination: Coordination normal.     Gait: Gait normal.  Psychiatric:        Mood and Affect: Mood normal.        Behavior: Behavior normal.        Thought Content: Thought content normal.        Judgment: Judgment normal.      No results found.  Assessment/plan: SHAREENA WILLENBRINK is a 77 y.o. female present for  Adjustment reaction with anxiety and depression/Sleep disorder Improved Continue lexapro 10 mg every day.  Had been struggling with CPAP use, but compliant Continue trazodone  25-50 mg qhs   HTN/a.fib/Mixed hyperlipidemia/aortic atherosclerosis/AR/MR/CAD-h/o stroke/aquired thrombophilia-eliquis Stable Continue amlodipine 5 mg daily Continue losartan 12.5 mg daily Continue Lipitor 40 nightly Continue  sectral and eliquis prescribed by cardiology.  Labs due next visit   Sleep disorder/OSA: Had been struggling with CPAP use, but compliant Working well, Trazodone 25-50 mg qd.   Hypothyroidism due to acquired atrophy of thyroid Continue levothyroxine 88 mcg daily TSH up-to-date 12/23/2022   Chronic cough/GERD Agreed to take over gabapentin 300 mg BID Pt reports no sedation affects    Return in about 4 months (around 07/07/2023) for cpe (20 min), Routine chronic condition follow-up.   No orders of the defined types were placed in this encounter.  Meds ordered this encounter  Medications   escitalopram (LEXAPRO) 10 MG tablet    Sig: Take 1 tablet (10 mg total) by mouth daily.    Dispense:  90 tablet    Refill:  1   traZODone (DESYREL) 50 MG tablet  Sig: Take 0.5-1 tablets (25-50 mg total) by mouth at bedtime as needed for sleep. Take half tablet by mouth as needed at bedtime    Dispense:  90 tablet    Refill:  1   losartan (COZAAR) 25 MG tablet    Sig: Take 0.5 tablets (12.5 mg total) by mouth daily.    Dispense:  45 tablet    Refill:  2   gabapentin (NEURONTIN) 300 MG capsule    Sig: Take 1 capsule (300 mg total) by mouth 2 (two) times daily.    Dispense:  180 capsule    Refill:  1   Referral Orders  No referral(s) requested today     Electronically signed by: Felix Pacini, DO [facility]

## 2023-03-03 DIAGNOSIS — G4733 Obstructive sleep apnea (adult) (pediatric): Secondary | ICD-10-CM | POA: Diagnosis not present

## 2023-04-03 DIAGNOSIS — G4733 Obstructive sleep apnea (adult) (pediatric): Secondary | ICD-10-CM | POA: Diagnosis not present

## 2023-04-05 ENCOUNTER — Ambulatory Visit: Payer: Medicare PPO | Admitting: Cardiology

## 2023-04-05 ENCOUNTER — Telehealth: Payer: Self-pay

## 2023-04-05 NOTE — Telephone Encounter (Signed)
 Copied from CRM [id number]. Topic: General - Other >> Apr 05, 2023  4:00 PM Tiffany H wrote: Reason for CRM: Patient called to request physical therapy referral for her arm. Provider is aware of problem with this arm.   [facility] Fax: [phone] Phone: [phone]

## 2023-04-06 ENCOUNTER — Ambulatory Visit: Payer: Medicare PPO | Attending: Cardiology | Admitting: Cardiology

## 2023-04-06 ENCOUNTER — Encounter: Payer: Self-pay | Admitting: Cardiology

## 2023-04-06 VITALS — BP 136/80 | HR 57 | Ht 65.0 in | Wt 126.6 lb

## 2023-04-06 DIAGNOSIS — I351 Nonrheumatic aortic (valve) insufficiency: Secondary | ICD-10-CM | POA: Diagnosis not present

## 2023-04-06 DIAGNOSIS — I700 Atherosclerosis of aorta: Secondary | ICD-10-CM | POA: Diagnosis not present

## 2023-04-06 DIAGNOSIS — I10 Essential (primary) hypertension: Secondary | ICD-10-CM | POA: Diagnosis not present

## 2023-04-06 DIAGNOSIS — I480 Paroxysmal atrial fibrillation: Secondary | ICD-10-CM

## 2023-04-06 DIAGNOSIS — I4821 Permanent atrial fibrillation: Secondary | ICD-10-CM

## 2023-04-06 DIAGNOSIS — I4729 Other ventricular tachycardia: Secondary | ICD-10-CM

## 2023-04-06 MED ORDER — AMLODIPINE BESYLATE 5 MG PO TABS: 5.0000 mg | ORAL_TABLET | Freq: Every day | ORAL | 3 refills | Status: AC

## 2023-04-06 MED ORDER — ATORVASTATIN CALCIUM 40 MG PO TABS: 40.0000 mg | ORAL_TABLET | Freq: Every day | ORAL | 3 refills | Status: AC

## 2023-04-06 MED ORDER — APIXABAN 5 MG PO TABS
5.0000 mg | ORAL_TABLET | Freq: Two times a day (BID) | ORAL | 3 refills | Status: AC
Start: 2023-04-06 — End: ?

## 2023-04-06 MED ORDER — LOSARTAN POTASSIUM 25 MG PO TABS: 12.5000 mg | ORAL_TABLET | Freq: Every day | ORAL | 3 refills | Status: AC

## 2023-04-06 MED ORDER — ACEBUTOLOL HCL 200 MG PO CAPS: 200.0000 mg | ORAL_CAPSULE | Freq: Every day | ORAL | 3 refills | Status: DC

## 2023-04-06 NOTE — Progress Notes (Signed)
 Cardiology Office Note:    Date:  04/07/2023   ID:  DEARI SESSLER, DOB 05-13-1945, MRN [id number]  PCP:  Catherine Charlies LABOR, DO  Cardiologist:  Misaki Sozio, DO  Electrophysiologist:  None   Referring MD: Catherine Charlies LABOR, DO    I am ok  History of Present Illness:    Yvette Short is a 78 y.o. female with a hx of  hypertension, hyperlipidemia, hypothyroidism, moderate coronary artery disease by coronary CTA, frequent PACs and PVCs, moderate mitral regurgitation, moderate aortic regurgitation presents today for follow-up visit.   She offers no significant complaints at this time.  Past Medical History:  Diagnosis Date   Abnormal colonoscopy 03/2017   decreased rectal tone and polyp; 5 year recall   Abnormal findings on esophagogastroduodenoscopy (EGD) 02/25/2016   Z-line irrg. at 40 cm. No abnomrality of the esophagus to explain dysphagia. Esophagus dilated. H/o + H. Pylori   Allergy     ANXIETY 07/06/2007   Aortic atherosclerosis (HCC) 02/08/2018   Aortic valve regurgitation    Atherosclerosis of native coronary artery of native heart without angina pectoris 02/08/2018   Biceps tendonitis on right    Bronchiectasis without complication (HCC) 03/16/2011   CT 2012 IMPRESSION:  1. Apical scarring may account for the plain film abnormality.  2. A 6 mm right upper lobe pulmonary nodule.  - - HRCT 07/12/2017 >>>  Scattered minimal cylindrical and varicoid bronchiectasis with associated scattered mucoid impaction and minimal tree-in-bud opacity in both lungs, predominantly in the mid to lower lungs. Findings are stable to slightly worsened since 2015 ches   Chicken pox    Chronic cough 05/18/2017   CT chest 03/19/14  No bronchiectasis (not hrct)  Spirometry 05/18/2017  FEV1  2.15 (93%)  Ratio 76 s rx prior  - FENO 05/18/2017  =   Could not perform   - Allergy  profile 05/18/2017 >  Eos 0.2 /  IgE  75 RAST pos cat > dog > mold - HRCT 07/12/2017 >>>  See bronchiectasis - trial of dymista   07/26/2017 > some better but not consistent with it > rechallenge rx one puff each am 10/25/2017  - MCT 11/02/17 >     Colon polyp 03/31/2017   hyperplastic   COVID-19 08/04/2020   mild   Diverticulosis 03/2017   sigmoid   Family history of colon cancer 04/03/2017   Formatting of this note might be different from the original. 1/19 colon - no adenomas Recommend screening exam in 1/24 - [facility]   Family history of ovarian cancer    Genetic testing 03/26/2016   Negative genetic testing on the [facility] 30 gene panel.  The 30-Gene Cancer Panel offered by [facility] includes sequencing and/or deletion duplication testing of the following 30 genes: APC, ATM, BAP1, BARD1, BMPR1A, BRCA1, BRCA2, BRIP1, CDH1, CDK4, CDKN2A (p14ARF), CDKN2A (p16INK4a), CHEK2, EPCAM, GREM1, MITF, MLH1, MSH2, MSH6, MUTYH, NBN, PALB2, PMS2, POLD1, POLE, PTEN, RAD51C, RAD51D, SMAD4,    GERD 11/09/2006   H. pylori infection    h/o treated wiht prevpac   Hormone replacement therapy 01/08/2019   HYPERLIPIDEMIA 11/09/2006   Hypertension    Hypothyroidism 08/03/2017   LOC (loss of consciousness) (HCC) 01/26/2022   Lumbar radiculopathy 07/24/2014   Migraine    Mild depression 02/14/2019   Pharyngoesophageal dysphagia 08/03/2017   Piriformis syndrome of right side    Spinal stenosis, lumbar region, with neurogenic claudication 10/16/2014   Epidural 10/02/2014, December 2016    Stomach ulcer 2008  Past Surgical History:  Procedure Laterality Date   CATARACT EXTRACTION Bilateral 04/2021   CERVICAL POLYPECTOMY  01/17/2019   LIPOMA EXCISION     of back   TUBAL LIGATION      Current Medications: Current Meds  Medication Sig   Azelastine  HCl 137 MCG/SPRAY SOLN Place into both nostrils.   chlorpheniramine (CHLOR-TRIMETON) 4 MG tablet Take 4 mg by mouth every 4 (four) hours as needed for allergies.   Cholecalciferol (DIALYVITE VITAMIN D  5000 PO) Take by mouth.   escitalopram  (LEXAPRO ) 10 MG tablet Take 1 tablet (10  mg total) by mouth daily.   estradiol (VIVELLE-DOT) 0.0375 MG/24HR Place 1 patch onto the skin 2 (two) times a week.   gabapentin  (NEURONTIN ) 300 MG capsule Take 1 capsule (300 mg total) by mouth 2 (two) times daily.   levothyroxine  (SYNTHROID ) 88 MCG tablet Take 1 tablet (88 mcg total) by mouth daily.   MAGNESIUM PO Take by mouth.   progesterone (PROMETRIUM) 100 MG capsule Take 100 mg by mouth in the morning and at bedtime. TAKING DAILY   traZODone  (DESYREL ) 50 MG tablet Take 0.5-1 tablets (25-50 mg total) by mouth at bedtime as needed for sleep. Take half tablet by mouth as needed at bedtime   VITAMIN K PO Take by mouth.   [DISCONTINUED] acebutolol  (SECTRAL ) 200 MG capsule TAKE 1 CAPSULE BY MOUTH TWICE A DAY (Patient taking differently: Take 200 mg by mouth daily.)   [DISCONTINUED] amLODipine  (NORVASC ) 5 MG tablet Take 1 tablet (5 mg total) by mouth daily.   [DISCONTINUED] atorvastatin  (LIPITOR) 40 MG tablet Take 1 tablet (40 mg total) by mouth daily.   [DISCONTINUED] ELIQUIS  5 MG TABS tablet TAKE 1 TABLET BY MOUTH TWICE A DAY   [DISCONTINUED] losartan  (COZAAR ) 25 MG tablet Take 0.5 tablets (12.5 mg total) by mouth daily.     Allergies:   Tetracycline hcl   Social History   Socioeconomic History   Marital status: Married    Spouse name: Not on file   Number of children: Not on file   Years of education: Not on file   Highest education level: Not on file  Occupational History   Not on file  Tobacco Use   Smoking status: Former    Current packs/day: 0.00    Average packs/day: 2.5 packs/day for 13.0 years (32.5 ttl pk-yrs)    Types: Cigarettes    Start date: 07/27/1961    Quit date: 07/28/1974    Years since quitting: 48.7   Smokeless tobacco: Never  Vaping Use   Vaping status: Never Used  Substance and Sexual Activity   Alcohol use: Yes    Alcohol/week: 2.0 standard drinks of alcohol    Types: 2 Cans of beer per week   Drug use: No   Sexual activity: Yes    Partners: Male   Other Topics Concern   Not on file  Social History Narrative   Married. Two children.    College grad.    Former smoker.    Exercises routinely.    Drink caffeine.    Wears a hearing aid.    Smoke alarm in the home.    Wears her seatbelt.    Feels safe in her relationships.    Social Drivers of Corporate Investment Banker Strain: Low Risk  (08/25/2022)   Overall Financial Resource Strain (CARDIA)    Difficulty of Paying Living Expenses: Not hard at all  Food Insecurity: No Food Insecurity (08/25/2022)   Hunger Vital Sign  Worried About Programme Researcher, Broadcasting/film/video in the Last Year: Never true    Ran Out of Food in the Last Year: Never true  Transportation Needs: No Transportation Needs (08/25/2022)   PRAPARE - Administrator, Civil Service (Medical): No    Lack of Transportation (Non-Medical): No  Physical Activity: Sufficiently Active (08/25/2022)   Exercise Vital Sign    Days of Exercise per Week: 3 days    Minutes of Exercise per Session: 60 min  Stress: No Stress Concern Present (08/25/2022)   [employer] of Occupational Health - Occupational Stress Questionnaire    Feeling of Stress : Not at all  Social Connections: Moderately Integrated (08/25/2022)   Social Connection and Isolation Panel [NHANES]    Frequency of Communication with Friends and Family: Once a week    Frequency of Social Gatherings with Friends and Family: Three times a week    Attends Religious Services: More than 4 times per year    Active Member of Clubs or Organizations: No    Attends Banker Meetings: Never    Marital Status: Married     Family History: The patient's family history includes Breast cancer in her cousin; Cancer in an other family member; Colon cancer in her maternal uncle and paternal aunt; Colon cancer (age of onset: 9) in her father; Colon polyps in her brother; Dementia in her mother; Heart attack in her maternal grandfather; Hyperlipidemia in her brother and  mother; Hypertension in her brother; Uterine cancer in her maternal aunt.  ROS:   Review of Systems  Constitution: Negative for decreased appetite, fever and weight gain.  HENT: Negative for congestion, ear discharge, hoarse voice and sore throat.   Eyes: Negative for discharge, redness, vision loss in right eye and visual halos.  Cardiovascular: Negative for chest pain, dyspnea on exertion, leg swelling, orthopnea and palpitations.  Respiratory: Negative for cough, hemoptysis, shortness of breath and snoring.   Endocrine: Negative for heat intolerance and polyphagia.  Hematologic/Lymphatic: Negative for bleeding problem. Does not bruise/bleed easily.  Skin: Negative for flushing, nail changes, rash and suspicious lesions.  Musculoskeletal: Negative for arthritis, joint pain, muscle cramps, myalgias, neck pain and stiffness.  Gastrointestinal: Negative for abdominal pain, bowel incontinence, diarrhea and excessive appetite.  Genitourinary: Negative for decreased libido, genital sores and incomplete emptying.  Neurological: Negative for brief paralysis, focal weakness, headaches and loss of balance.  Psychiatric/Behavioral: Negative for altered mental status, depression and suicidal ideas.  Allergic/Immunologic: Negative for HIV exposure and persistent infections.    EKGs/Labs/Other Studies Reviewed:    The following studies were reviewed today:   EKG:  The ekg ordered today demonstrates sinus bradycardia, HR 57 bpm   Recent Labs: 12/23/2022: ALT 22; BUN 21; Creatinine, Ser 1.02; Hemoglobin 13.6; Platelets 263.0; Potassium 4.4; Sodium 142; TSH 2.55  Recent Lipid Panel    Component Value Date/Time   CHOL 143 07/06/2022 0821   TRIG 96.0 07/06/2022 0821   HDL 63.30 07/06/2022 0821   CHOLHDL 2 07/06/2022 0821   VLDL 19.2 07/06/2022 0821   LDLCALC 61 07/06/2022 0821   LDLDIRECT 158.2 05/07/2013 0928    Physical Exam:    VS:  BP 136/80   Pulse (!) 57   Ht 5' 5 (1.651 m)   Wt  126 lb 9.6 oz (57.4 kg)   SpO2 99%   BMI 21.07 kg/m     Wt Readings from Last 3 Encounters:  04/06/23 126 lb 9.6 oz (57.4 kg)  03/01/23 126 lb (  57.2 kg)  01/14/23 127 lb (57.6 kg)     GEN: Well nourished, well developed in no acute distress HEENT: Normal NECK: No JVD; No carotid bruits LYMPHATICS: No lymphadenopathy CARDIAC: S1S2 noted,RRR, no murmurs, rubs, gallops RESPIRATORY:  Clear to auscultation without rales, wheezing or rhonchi  ABDOMEN: Soft, non-tender, non-distended, +bowel sounds, no guarding. EXTREMITIES: No edema, No cyanosis, no clubbing MUSCULOSKELETAL:  No deformity  SKIN: Warm and dry NEUROLOGIC:  Alert and oriented x 3, non-focal PSYCHIATRIC:  Normal affect, good insight  ASSESSMENT:    1. Essential hypertension   2. Paroxysmal atrial fibrillation (HCC)   3. Aortic atherosclerosis (HCC)   4. Moderate aortic regurgitation   5. Non-sustained ventricular tachycardia (HCC)    PLAN:    Hypertension Initial blood pressure elevated, but decreased on repeat measurement. Patient not regularly monitoring blood pressure at home. Currently on Amlodipine  and Losartan . -Continue current medications. -Encourage patient to monitor blood pressure at home.  Medication Refills Patient on Amlodipine , Eliquis , Lipitor, and Losartan . -Refill all current medications.  Moderate mitral regurgitation - does not appear to be volume overloaded. Plan for repeat echo at follow up visit.  Follow-up No current complaints or changes in health status. -Schedule annual follow-up appointment, or sooner if any issues arise.    Continue current dose of statin  The patient is in agreement with the above plan. The patient left the office in stable condition.  The patient will follow up in   Medication Adjustments/Labs and Tests Ordered: Current medicines are reviewed at length with the patient today.  Concerns regarding medicines are outlined above.  Orders Placed This Encounter   Procedures   EKG 12-Lead   Meds ordered this encounter  Medications   losartan  (COZAAR ) 25 MG tablet    Sig: Take 0.5 tablets (12.5 mg total) by mouth daily.    Dispense:  45 tablet    Refill:  3   atorvastatin  (LIPITOR) 40 MG tablet    Sig: Take 1 tablet (40 mg total) by mouth daily.    Dispense:  90 tablet    Refill:  3   amLODipine  (NORVASC ) 5 MG tablet    Sig: Take 1 tablet (5 mg total) by mouth daily.    Dispense:  90 tablet    Refill:  3   acebutolol  (SECTRAL ) 200 MG capsule    Sig: Take 1 capsule (200 mg total) by mouth daily.    Dispense:  90 capsule    Refill:  3   apixaban  (ELIQUIS ) 5 MG TABS tablet    Sig: Take 1 tablet (5 mg total) by mouth 2 (two) times daily.    Dispense:  180 tablet    Refill:  3    Patient Instructions  Medication Instructions:  Your physician recommends that you continue on your current medications as directed. Please refer to the Current Medication list given to you today.  *If you need a refill on your cardiac medications before your next appointment, please call your pharmacy*    Follow-Up: At [facility], you and your health needs are our priority.  As part of our continuing mission to provide you with exceptional heart care, we have created designated Provider Care Teams.  These Care Teams include your primary Cardiologist (physician) and Advanced Practice Providers (APPs -  Physician Assistants and Nurse Practitioners) who all work together to provide you with the care you need, when you need it.   Your next appointment:   12 month(s)  Provider:  Calhoun Reichardt, DO     Other Instructions You are invited to attend: Women's Heart Community event on February Friday 7th 2025 at [facility] ([address] [place], [place], [place] [address]) from 8am-12pm. Feel free to invite other women to attend!  See you there!      Adopting a Healthy Lifestyle.  Know what a healthy weight is for you (roughly BMI <25) and aim to maintain  this   Aim for 7+ servings of fruits and vegetables daily   65-80+ fluid ounces of water or unsweet tea for healthy kidneys   Limit to max 1 drink of alcohol per day; avoid smoking/tobacco   Limit animal fats in diet for cholesterol and heart health - choose grass fed whenever available   Avoid highly processed foods, and foods high in saturated/trans fats   Aim for low stress - take time to unwind and care for your mental health   Aim for 150 min of moderate intensity exercise weekly for heart health, and weights twice weekly for bone health   Aim for 7-9 hours of sleep daily   When it comes to diets, agreement about the perfect plan isnt easy to find, even among the experts. Experts at the [facility] [employer] developed an idea known as the Healthy Eating Plate. Just imagine a plate divided into logical, healthy portions.   The emphasis is on diet quality:   Load up on vegetables and fruits - one-half of your plate: Aim for color and variety, and remember that potatoes dont count.   Go for whole grains - one-quarter of your plate: Whole wheat, barley, wheat berries, quinoa, oats, brown rice, and foods made with them. If you want pasta, go with whole wheat pasta.   Protein power - one-quarter of your plate: Fish, chicken, beans, and nuts are all healthy, versatile protein sources. Limit red meat.   The diet, however, does go beyond the plate, offering a few other suggestions.   Use healthy plant oils, such as olive, canola, soy, corn, sunflower and peanut. Check the labels, and avoid partially hydrogenated oil, which have unhealthy trans fats.   If youre thirsty, drink water. Coffee and tea are good in moderation, but skip sugary drinks and limit milk and dairy products to one or two daily servings.   The type of carbohydrate in the diet is more important than the amount. Some sources of carbohydrates, such as vegetables, fruits, whole grains, and beans-are healthier  than others.   Finally, stay active  Signed, Dub Huntsman, DO  04/07/2023 12:59 PM    [facility]

## 2023-04-06 NOTE — Patient Instructions (Addendum)
 Medication Instructions:  Your physician recommends that you continue on your current medications as directed. Please refer to the Current Medication list given to you today.  *If you need a refill on your cardiac medications before your next appointment, please call your pharmacy*    Follow-Up: At [facility], you and your health needs are our priority.  As part of our continuing mission to provide you with exceptional heart care, we have created designated Provider Care Teams.  These Care Teams include your primary Cardiologist (physician) and Advanced Practice Providers (APPs -  Physician Assistants and Nurse Practitioners) who all work together to provide you with the care you need, when you need it.   Your next appointment:   12 month(s)  Provider:   Kardie Tobb, DO     Other Instructions You are invited to attend: Women's Heart Community event on February Friday 7th 2025 at [facility] ([address] [place], [place], [place] [address]) from 8am-12pm. Feel free to invite other women to attend!  See you there!

## 2023-04-12 ENCOUNTER — Ambulatory Visit: Payer: Medicare PPO | Admitting: Family Medicine

## 2023-04-12 VITALS — BP 102/76 | HR 76 | Temp 97.9°F | Wt 126.0 lb

## 2023-04-12 DIAGNOSIS — M25511 Pain in right shoulder: Secondary | ICD-10-CM | POA: Diagnosis not present

## 2023-04-12 DIAGNOSIS — M25512 Pain in left shoulder: Secondary | ICD-10-CM | POA: Diagnosis not present

## 2023-04-12 NOTE — Patient Instructions (Signed)

## 2023-04-12 NOTE — Progress Notes (Signed)
 Yvette Short , [birth year], 78 y.o., female MRN: [id number] Patient Care Team    Relationship Specialty Notifications Start End  Catherine Charlies LABOR, DO PCP - General Family Medicine  08/03/17   Sheena Pugh, DO PCP - Cardiology Cardiology  11/25/21   Mat Browning, MD Consulting Physician Obstetrics and Gynecology  08/03/17   Darlean Ozell NOVAK, MD Consulting Physician Pulmonary Disease  08/03/17   Claudene Arthea HERO, DO Consulting Physician Sports Medicine  08/03/17   Arloa Alyce HERO, MD Referring Physician Gastroenterology  08/03/17   Cleotilde Sewer, OD  Optometry  08/03/17   Sheena Pugh, DO Consulting Physician Cardiology  06/30/21     Chief Complaint  Patient presents with   Shoulder Pain    Started last week. PT helped last time pain flared     Subjective: Yvette Short is a 78 y.o. Pt presents for an OV with complaints of bilateral shoulder pain of 1 week duration.  Associated symptoms include pain with range of motion.  She reports she has had a biceps tendon partial tear in her right shoulder in the past.  She denies any known recent injury. She reports her right shoulder is painful, but she also feels pain in her left shoulder.  She would like to try physical therapy to help with her discomfort.     01/14/2023   10:14 AM 12/23/2022    8:24 AM 08/25/2022    3:24 PM 07/06/2022    8:20 AM 06/02/2021    1:16 PM  Depression screen PHQ 2/9  Decreased Interest 1 0 0 0 0  Down, Depressed, Hopeless 1 1 0 0 0  PHQ - 2 Score 2 1 0 0 0  Altered sleeping 2      Tired, decreased energy 2      Change in appetite 1      Feeling bad or failure about yourself  1      Trouble concentrating 3      Moving slowly or fidgety/restless 0      Suicidal thoughts 0      PHQ-9 Score 11      Difficult doing work/chores Somewhat difficult        Allergies  Allergen Reactions   Tetracycline Hcl Rash        Social History   Social History Narrative   Married. Two children.    College grad.     Former smoker.    Exercises routinely.    Drink caffeine.    Wears a hearing aid.    Smoke alarm in the home.    Wears her seatbelt.    Feels safe in her relationships.    Past Medical History:  Diagnosis Date   Abnormal colonoscopy 03/2017   decreased rectal tone and polyp; 5 year recall   Abnormal findings on esophagogastroduodenoscopy (EGD) 02/25/2016   Z-line irrg. at 40 cm. No abnomrality of the esophagus to explain dysphagia. Esophagus dilated. H/o + H. Pylori   Allergy     ANXIETY 07/06/2007   Aortic atherosclerosis (HCC) 02/08/2018   Aortic valve regurgitation    Atherosclerosis of native coronary artery of native heart without angina pectoris 02/08/2018   Biceps tendonitis on right    Bronchiectasis without complication (HCC) 03/16/2011   CT 2012 IMPRESSION:  1. Apical scarring may account for the plain film abnormality.  2. A 6 mm right upper lobe pulmonary nodule.  - - HRCT 07/12/2017 >>>  Scattered minimal cylindrical and varicoid bronchiectasis  with associated scattered mucoid impaction and minimal tree-in-bud opacity in both lungs, predominantly in the mid to lower lungs. Findings are stable to slightly worsened since 2015 ches   Chicken pox    Chronic cough 05/18/2017   CT chest 03/19/14  No bronchiectasis (not hrct)  Spirometry 05/18/2017  FEV1  2.15 (93%)  Ratio 76 s rx prior  - FENO 05/18/2017  =   Could not perform   - Allergy  profile 05/18/2017 >  Eos 0.2 /  IgE  75 RAST pos cat > dog > mold - HRCT 07/12/2017 >>>  See bronchiectasis - trial of dymista  07/26/2017 > some better but not consistent with it > rechallenge rx one puff each am 10/25/2017  - MCT 11/02/17 >     Colon polyp 03/31/2017   hyperplastic   COVID-19 08/04/2020   mild   Diverticulosis 03/2017   sigmoid   Family history of colon cancer 04/03/2017   Formatting of this note might be different from the original. 1/19 colon - no adenomas Recommend screening exam in 1/24 - [facility]   Family history of ovarian  cancer    Genetic testing 03/26/2016   Negative genetic testing on the [facility] 30 gene panel.  The 30-Gene Cancer Panel offered by [facility] includes sequencing and/or deletion duplication testing of the following 30 genes: APC, ATM, BAP1, BARD1, BMPR1A, BRCA1, BRCA2, BRIP1, CDH1, CDK4, CDKN2A (p14ARF), CDKN2A (p16INK4a), CHEK2, EPCAM, GREM1, MITF, MLH1, MSH2, MSH6, MUTYH, NBN, PALB2, PMS2, POLD1, POLE, PTEN, RAD51C, RAD51D, SMAD4,    GERD 11/09/2006   H. pylori infection    h/o treated wiht prevpac   Hormone replacement therapy 01/08/2019   HYPERLIPIDEMIA 11/09/2006   Hypertension    Hypothyroidism 08/03/2017   LOC (loss of consciousness) (HCC) 01/26/2022   Lumbar radiculopathy 07/24/2014   Migraine    Mild depression 02/14/2019   Pharyngoesophageal dysphagia 08/03/2017   Piriformis syndrome of right side    Spinal stenosis, lumbar region, with neurogenic claudication 10/16/2014   Epidural 10/02/2014, December 2016    Stomach ulcer 2008   Past Surgical History:  Procedure Laterality Date   CATARACT EXTRACTION Bilateral 04/2021   CERVICAL POLYPECTOMY  01/17/2019   LIPOMA EXCISION     of back   TUBAL LIGATION     Family History  Problem Relation Age of Onset   Dementia Mother    Hyperlipidemia Mother    Colon cancer Father 52   Colon polyps Brother        gets colonoscopy every 2-3 years   Hyperlipidemia Brother    Hypertension Brother    Uterine cancer Maternal Aunt        dx in her 71s   Colon cancer Maternal Uncle    Colon cancer Paternal Aunt    Heart attack Maternal Grandfather    Cancer Other        MGMs sister - abdominal cancer   Breast cancer Cousin    Allergies as of 04/12/2023       Reactions   Tetracycline Hcl Rash            Medication List        Accurate as of April 12, 2023  3:33 PM. If you have any questions, ask your nurse or doctor.          acebutolol  200 MG capsule Commonly known as: SECTRAL  Take 1 capsule (200 mg total) by  mouth daily.   amLODipine  5 MG tablet Commonly known as: NORVASC  Take 1 tablet (5  mg total) by mouth daily.   apixaban  5 MG Tabs tablet Commonly known as: Eliquis  Take 1 tablet (5 mg total) by mouth 2 (two) times daily.   atorvastatin  40 MG tablet Commonly known as: LIPITOR Take 1 tablet (40 mg total) by mouth daily.   Azelastine  HCl 137 MCG/SPRAY Soln Place into both nostrils.   chlorpheniramine 4 MG tablet Commonly known as: CHLOR-TRIMETON Take 4 mg by mouth every 4 (four) hours as needed for allergies.   DIALYVITE VITAMIN D  5000 PO Take by mouth.   escitalopram  10 MG tablet Commonly known as: Lexapro  Take 1 tablet (10 mg total) by mouth daily.   estradiol 0.0375 MG/24HR Commonly known as: VIVELLE-DOT Place 1 patch onto the skin 2 (two) times a week.   gabapentin  300 MG capsule Commonly known as: NEURONTIN  Take 1 capsule (300 mg total) by mouth 2 (two) times daily.   levothyroxine  88 MCG tablet Commonly known as: SYNTHROID  Take 1 tablet (88 mcg total) by mouth daily.   losartan  25 MG tablet Commonly known as: COZAAR  Take 0.5 tablets (12.5 mg total) by mouth daily.   MAGNESIUM PO Take by mouth.   progesterone 100 MG capsule Commonly known as: PROMETRIUM Take 100 mg by mouth in the morning and at bedtime. TAKING DAILY   traZODone  50 MG tablet Commonly known as: DESYREL  Take 0.5-1 tablets (25-50 mg total) by mouth at bedtime as needed for sleep. Take half tablet by mouth as needed at bedtime   VITAMIN K PO Take by mouth.        All past medical history, surgical history, allergies, family history, immunizations andmedications were updated in the EMR today and reviewed under the history and medication portions of their EMR.     ROS Negative, with the exception of above mentioned in HPI   Objective:  BP 102/76   Pulse 76   Temp 97.9 F (36.6 C)   Wt 126 lb (57.2 kg)   SpO2 95%   BMI 20.97 kg/m  Body mass index is 20.97 kg/m. Physical  Exam Vitals and nursing note reviewed.  Constitutional:      General: She is not in acute distress.    Appearance: Normal appearance. She is normal weight. She is not ill-appearing or toxic-appearing.  HENT:     Head: Normocephalic and atraumatic.  Eyes:     General: No scleral icterus.       Right eye: No discharge.        Left eye: No discharge.     Extraocular Movements: Extraocular movements intact.     Conjunctiva/sclera: Conjunctivae normal.     Pupils: Pupils are equal, round, and reactive to light.  Musculoskeletal:     Right shoulder: Tenderness present. Decreased range of motion. Decreased strength.     Left shoulder: Tenderness present. Decreased range of motion. Decreased strength.     Comments: Positive empty can test bilaterally.  Positive Hawkins bilaterally.  Neurovascular intact distally  Skin:    Findings: No rash.  Neurological:     Mental Status: She is alert and oriented to person, place, and time. Mental status is at baseline.     Motor: No weakness.     Coordination: Coordination normal.     Gait: Gait normal.  Psychiatric:        Mood and Affect: Mood normal.        Behavior: Behavior normal.        Thought Content: Thought content normal.  Judgment: Judgment normal.     No results found. No results found. No results found for this or any previous visit (from the past 24 hours).  Assessment/Plan: Yvette Short is a 78 y.o. female present for OV for  Acute pain of both shoulders (Primary) Suspect she strained rotators by exam today.  She has benefited from physical therapy in the past and would like to perform physical therapy at the [facility].  Referral placed for her today. - Ambulatory referral to Physical Therapy  Reviewed expectations re: course of current medical issues. Discussed self-management of symptoms. Outlined signs and symptoms indicating need for more acute intervention. Patient verbalized understanding  and all questions were answered. Patient received an After-Visit Summary.    Orders Placed This Encounter  Procedures   Ambulatory referral to Physical Therapy   No orders of the defined types were placed in this encounter.  Referral Orders         Ambulatory referral to Physical Therapy       Note is dictated utilizing voice recognition software. Although note has been proof read prior to signing, occasional typographical errors still can be missed. If any questions arise, please do not hesitate to call for verification.   electronically signed by:  Charlies Bellini, DO  [facility] [place]

## 2023-04-28 ENCOUNTER — Other Ambulatory Visit: Payer: Self-pay | Admitting: Cardiology

## 2023-04-29 DIAGNOSIS — S46201A Unspecified injury of muscle, fascia and tendon of other parts of biceps, right arm, initial encounter: Secondary | ICD-10-CM | POA: Diagnosis not present

## 2023-04-29 DIAGNOSIS — R531 Weakness: Secondary | ICD-10-CM | POA: Diagnosis not present

## 2023-04-29 DIAGNOSIS — M79621 Pain in right upper arm: Secondary | ICD-10-CM | POA: Diagnosis not present

## 2023-05-04 DIAGNOSIS — G4733 Obstructive sleep apnea (adult) (pediatric): Secondary | ICD-10-CM | POA: Diagnosis not present

## 2023-05-05 DIAGNOSIS — Z01419 Encounter for gynecological examination (general) (routine) without abnormal findings: Secondary | ICD-10-CM | POA: Diagnosis not present

## 2023-05-05 DIAGNOSIS — Z6821 Body mass index (BMI) 21.0-21.9, adult: Secondary | ICD-10-CM | POA: Diagnosis not present

## 2023-05-06 DIAGNOSIS — S46201A Unspecified injury of muscle, fascia and tendon of other parts of biceps, right arm, initial encounter: Secondary | ICD-10-CM | POA: Diagnosis not present

## 2023-05-06 DIAGNOSIS — M79621 Pain in right upper arm: Secondary | ICD-10-CM | POA: Diagnosis not present

## 2023-05-06 DIAGNOSIS — R531 Weakness: Secondary | ICD-10-CM | POA: Diagnosis not present

## 2023-05-13 DIAGNOSIS — S46201A Unspecified injury of muscle, fascia and tendon of other parts of biceps, right arm, initial encounter: Secondary | ICD-10-CM | POA: Diagnosis not present

## 2023-05-13 DIAGNOSIS — R531 Weakness: Secondary | ICD-10-CM | POA: Diagnosis not present

## 2023-05-13 DIAGNOSIS — M79621 Pain in right upper arm: Secondary | ICD-10-CM | POA: Diagnosis not present

## 2023-05-20 DIAGNOSIS — R531 Weakness: Secondary | ICD-10-CM | POA: Diagnosis not present

## 2023-05-20 DIAGNOSIS — S46201A Unspecified injury of muscle, fascia and tendon of other parts of biceps, right arm, initial encounter: Secondary | ICD-10-CM | POA: Diagnosis not present

## 2023-05-20 DIAGNOSIS — M79621 Pain in right upper arm: Secondary | ICD-10-CM | POA: Diagnosis not present

## 2023-05-27 DIAGNOSIS — S46201A Unspecified injury of muscle, fascia and tendon of other parts of biceps, right arm, initial encounter: Secondary | ICD-10-CM | POA: Diagnosis not present

## 2023-05-27 DIAGNOSIS — R531 Weakness: Secondary | ICD-10-CM | POA: Diagnosis not present

## 2023-05-27 DIAGNOSIS — M79621 Pain in right upper arm: Secondary | ICD-10-CM | POA: Diagnosis not present

## 2023-05-31 DIAGNOSIS — M79621 Pain in right upper arm: Secondary | ICD-10-CM | POA: Diagnosis not present

## 2023-05-31 DIAGNOSIS — G4733 Obstructive sleep apnea (adult) (pediatric): Secondary | ICD-10-CM | POA: Diagnosis not present

## 2023-05-31 DIAGNOSIS — R531 Weakness: Secondary | ICD-10-CM | POA: Diagnosis not present

## 2023-05-31 DIAGNOSIS — S46201A Unspecified injury of muscle, fascia and tendon of other parts of biceps, right arm, initial encounter: Secondary | ICD-10-CM | POA: Diagnosis not present

## 2023-06-01 DIAGNOSIS — G4733 Obstructive sleep apnea (adult) (pediatric): Secondary | ICD-10-CM | POA: Diagnosis not present

## 2023-06-07 DIAGNOSIS — R531 Weakness: Secondary | ICD-10-CM | POA: Diagnosis not present

## 2023-06-07 DIAGNOSIS — S46201A Unspecified injury of muscle, fascia and tendon of other parts of biceps, right arm, initial encounter: Secondary | ICD-10-CM | POA: Diagnosis not present

## 2023-06-07 DIAGNOSIS — M79621 Pain in right upper arm: Secondary | ICD-10-CM | POA: Diagnosis not present

## 2023-06-14 DIAGNOSIS — R531 Weakness: Secondary | ICD-10-CM | POA: Diagnosis not present

## 2023-06-14 DIAGNOSIS — M79621 Pain in right upper arm: Secondary | ICD-10-CM | POA: Diagnosis not present

## 2023-06-14 DIAGNOSIS — S46201A Unspecified injury of muscle, fascia and tendon of other parts of biceps, right arm, initial encounter: Secondary | ICD-10-CM | POA: Diagnosis not present

## 2023-06-19 ENCOUNTER — Other Ambulatory Visit: Payer: Self-pay | Admitting: Cardiology

## 2023-06-21 ENCOUNTER — Other Ambulatory Visit: Payer: Self-pay | Admitting: Obstetrics and Gynecology

## 2023-06-21 ENCOUNTER — Ambulatory Visit: Admitting: Podiatry

## 2023-06-21 DIAGNOSIS — S46201A Unspecified injury of muscle, fascia and tendon of other parts of biceps, right arm, initial encounter: Secondary | ICD-10-CM | POA: Diagnosis not present

## 2023-06-21 DIAGNOSIS — Z1231 Encounter for screening mammogram for malignant neoplasm of breast: Secondary | ICD-10-CM

## 2023-06-21 DIAGNOSIS — M79621 Pain in right upper arm: Secondary | ICD-10-CM | POA: Diagnosis not present

## 2023-06-21 DIAGNOSIS — R531 Weakness: Secondary | ICD-10-CM | POA: Diagnosis not present

## 2023-06-28 DIAGNOSIS — M79621 Pain in right upper arm: Secondary | ICD-10-CM | POA: Diagnosis not present

## 2023-06-28 DIAGNOSIS — S46201A Unspecified injury of muscle, fascia and tendon of other parts of biceps, right arm, initial encounter: Secondary | ICD-10-CM | POA: Diagnosis not present

## 2023-06-28 DIAGNOSIS — R531 Weakness: Secondary | ICD-10-CM | POA: Diagnosis not present

## 2023-06-30 DIAGNOSIS — Z961 Presence of intraocular lens: Secondary | ICD-10-CM | POA: Diagnosis not present

## 2023-06-30 DIAGNOSIS — H43813 Vitreous degeneration, bilateral: Secondary | ICD-10-CM | POA: Diagnosis not present

## 2023-06-30 DIAGNOSIS — H02822 Cysts of right lower eyelid: Secondary | ICD-10-CM | POA: Diagnosis not present

## 2023-06-30 DIAGNOSIS — H26493 Other secondary cataract, bilateral: Secondary | ICD-10-CM | POA: Diagnosis not present

## 2023-07-05 DIAGNOSIS — R531 Weakness: Secondary | ICD-10-CM | POA: Diagnosis not present

## 2023-07-05 DIAGNOSIS — M79621 Pain in right upper arm: Secondary | ICD-10-CM | POA: Diagnosis not present

## 2023-07-05 DIAGNOSIS — S46201A Unspecified injury of muscle, fascia and tendon of other parts of biceps, right arm, initial encounter: Secondary | ICD-10-CM | POA: Diagnosis not present

## 2023-07-12 DIAGNOSIS — R531 Weakness: Secondary | ICD-10-CM | POA: Diagnosis not present

## 2023-07-12 DIAGNOSIS — M79621 Pain in right upper arm: Secondary | ICD-10-CM | POA: Diagnosis not present

## 2023-07-12 DIAGNOSIS — S46201A Unspecified injury of muscle, fascia and tendon of other parts of biceps, right arm, initial encounter: Secondary | ICD-10-CM | POA: Diagnosis not present

## 2023-07-14 ENCOUNTER — Encounter: Payer: Self-pay | Admitting: Family Medicine

## 2023-07-14 ENCOUNTER — Ambulatory Visit: Payer: Medicare PPO | Admitting: Family Medicine

## 2023-07-14 VITALS — BP 126/76 | HR 57 | Temp 97.8°F | Ht 65.0 in | Wt 126.0 lb

## 2023-07-14 DIAGNOSIS — R413 Other amnesia: Secondary | ICD-10-CM | POA: Diagnosis not present

## 2023-07-14 DIAGNOSIS — Z0000 Encounter for general adult medical examination without abnormal findings: Secondary | ICD-10-CM

## 2023-07-14 DIAGNOSIS — I4729 Other ventricular tachycardia: Secondary | ICD-10-CM

## 2023-07-14 DIAGNOSIS — E034 Atrophy of thyroid (acquired): Secondary | ICD-10-CM

## 2023-07-14 DIAGNOSIS — R499 Unspecified voice and resonance disorder: Secondary | ICD-10-CM

## 2023-07-14 DIAGNOSIS — I351 Nonrheumatic aortic (valve) insufficiency: Secondary | ICD-10-CM | POA: Diagnosis not present

## 2023-07-14 DIAGNOSIS — D6869 Other thrombophilia: Secondary | ICD-10-CM

## 2023-07-14 DIAGNOSIS — I10 Essential (primary) hypertension: Secondary | ICD-10-CM | POA: Diagnosis not present

## 2023-07-14 DIAGNOSIS — R7309 Other abnormal glucose: Secondary | ICD-10-CM | POA: Diagnosis not present

## 2023-07-14 DIAGNOSIS — G479 Sleep disorder, unspecified: Secondary | ICD-10-CM

## 2023-07-14 DIAGNOSIS — F4323 Adjustment disorder with mixed anxiety and depressed mood: Secondary | ICD-10-CM

## 2023-07-14 DIAGNOSIS — I2510 Atherosclerotic heart disease of native coronary artery without angina pectoris: Secondary | ICD-10-CM | POA: Diagnosis not present

## 2023-07-14 DIAGNOSIS — R053 Chronic cough: Secondary | ICD-10-CM

## 2023-07-14 DIAGNOSIS — E782 Mixed hyperlipidemia: Secondary | ICD-10-CM | POA: Diagnosis not present

## 2023-07-14 DIAGNOSIS — I6389 Other cerebral infarction: Secondary | ICD-10-CM

## 2023-07-14 DIAGNOSIS — I700 Atherosclerosis of aorta: Secondary | ICD-10-CM

## 2023-07-14 DIAGNOSIS — I4821 Permanent atrial fibrillation: Secondary | ICD-10-CM

## 2023-07-14 LAB — LIPID PANEL
Cholesterol: 167 mg/dL (ref 0–200)
HDL: 72.4 mg/dL (ref >39.00)
LDL Cholesterol: 66 mg/dL (ref 0–99)
NonHDL: 94.77
Total CHOL/HDL Ratio: 2
Triglycerides: 142 mg/dL (ref 0.0–149.0)
VLDL: 28.4 mg/dL (ref 0.0–40.0)

## 2023-07-14 LAB — CBC
HCT: 43.7 % (ref 36.0–46.0)
Hemoglobin: 14.3 g/dL (ref 12.0–15.0)
MCHC: 32.6 g/dL (ref 30.0–36.0)
MCV: 90.2 fl (ref 78.0–100.0)
Platelets: 259 10*3/uL (ref 150.0–400.0)
RBC: 4.84 Mil/uL (ref 3.87–5.11)
RDW: 13.5 % (ref 11.5–15.5)
WBC: 6.8 10*3/uL (ref 4.0–10.5)

## 2023-07-14 LAB — COMPREHENSIVE METABOLIC PANEL WITH GFR
ALT: 21 U/L (ref 0–35)
AST: 28 U/L (ref 0–37)
Albumin: 4.8 g/dL (ref 3.5–5.2)
Alkaline Phosphatase: 77 U/L (ref 39–117)
BUN: 17 mg/dL (ref 6–23)
CO2: 32 meq/L (ref 19–32)
Calcium: 9.3 mg/dL (ref 8.4–10.5)
Chloride: 103 meq/L (ref 96–112)
Creatinine, Ser: 0.97 mg/dL (ref 0.40–1.20)
GFR: 56.68 mL/min — ABNORMAL LOW (ref >60.00)
Glucose, Bld: 80 mg/dL (ref 70–99)
Potassium: 4.5 meq/L (ref 3.5–5.1)
Sodium: 142 meq/L (ref 135–145)
Total Bilirubin: 0.6 mg/dL (ref 0.2–1.2)
Total Protein: 6.5 g/dL (ref 6.0–8.3)

## 2023-07-14 LAB — TSH: TSH: 2.73 u[IU]/mL (ref 0.35–5.50)

## 2023-07-14 LAB — HEMOGLOBIN A1C: Hgb A1c MFr Bld: 5.6 % (ref 4.6–6.5)

## 2023-07-14 LAB — B12 AND FOLATE PANEL
Folate: 23.9 ng/mL (ref >5.9)
Vitamin B-12: 770 pg/mL (ref 211–911)

## 2023-07-14 LAB — VITAMIN D 25 HYDROXY (VIT D DEFICIENCY, FRACTURES): VITD: 50.87 ng/mL (ref 30.00–100.00)

## 2023-07-14 MED ORDER — LEVOTHYROXINE SODIUM 88 MCG PO TABS: 88.0000 ug | ORAL_TABLET | Freq: Every day | ORAL | 3 refills | Status: AC

## 2023-07-14 MED ORDER — ESCITALOPRAM OXALATE 10 MG PO TABS: 10.0000 mg | ORAL_TABLET | Freq: Every day | ORAL | 1 refills | Status: DC

## 2023-07-14 MED ORDER — GABAPENTIN 300 MG PO CAPS: 300.0000 mg | ORAL_CAPSULE | Freq: Two times a day (BID) | ORAL | 1 refills | Status: DC

## 2023-07-14 MED ORDER — TRAZODONE HCL 50 MG PO TABS: 25.0000 mg | ORAL_TABLET | Freq: Every evening | ORAL | 1 refills | Status: DC | PRN

## 2023-07-14 MED ORDER — AZELASTINE HCL 137 MCG/SPRAY NA SOLN: 1.0000 | Freq: Two times a day (BID) | NASAL | 11 refills | Status: AC

## 2023-07-14 NOTE — Progress Notes (Signed)
 Patient ID: Yvette Short, female  DOB: January 04, 1946, 78 y.o.   MRN: [id number] Patient Care Team    Relationship Specialty Notifications Start End  Natalia Leatherwood, DO PCP - General Family Medicine  08/03/17   Thomasene Ripple, DO PCP - Cardiology Cardiology  11/25/21   Marcelle Overlie, MD Consulting Physician Obstetrics and Gynecology  08/03/17   Nyoka Cowden, MD Consulting Physician Pulmonary Disease  08/03/17   Judi Saa, DO Consulting Physician Sports Medicine  08/03/17   Willa Rough, MD Referring Physician Gastroenterology  08/03/17   Blima Ledger, OD  Optometry  08/03/17   Thomasene Ripple, DO Consulting Physician Cardiology  06/30/21     Chief Complaint  Patient presents with   Annual Exam    Chronic Conditions/illness Management Pt is fasting.     Subjective: Yvette Short is a 78 y.o.  Female  present for CPE and chronic condition management All past medical history, surgical history, allergies, family history, immunizations, medications and social history were updated in the electronic medical record today. All recent labs, ED visits and hospitalizations within the last year were reviewed.  Health maintenance:  Mammogram: completed: 07/23/2022,Dr. grewal - has scheduled this month Cervical cancer screening: n/i Immunizations: tdap UTD 2018, Influenza utd 2024 (encouraged yearly), PNA series completed, zostavax completed shingrix completed DEXA: UTD 2018 (gyn) Assistive device: none Oxygen UMP:NTIR Patient has a Dental home. Hospitalizations/ED visits: Reviewed  Mild depression and worry: Patient reports she feels the Lexapro 10 mg has been helpful.  She is taking the trazodone to help with sleep nightly. Prior note: pt reports she feels she is worrying more about her health and future. She endorses feeling down in the dumps. She is not sleeping well. Trazodone helps some. She feels her bad days are now outweighing her good days.  She had been on effexor a few  years ago, mostly for MSK complaints, but she noticed a positive effect in her emotions on medication. She reports she had a hard time when she tried to come off that medication.  Chronic cough/voice change: Working well Gabapentin 300 mg twice daily   HTN/a.fib/Mixed hyperlipidemia/aortic atherosclerosis/AR/MR/CAD-h/o stroke Pt reports compliance with losartan12.5 mg qd, amlodipine 5 mg qd, eliquis, lipitor and sectral  Patient denies chest pain, shortness of breath, dizziness or lower extremity edema.  Speech therapy referral placed  Established with cardio. RF: [id number] CAD.stroke 08/30/2020-echo: Moderate mitral regurgitation, mild to moderate aortic regurgitation, mild to moderate tricuspid regurgitation. The right-sided pressures - mildly elevated.   Hypothyroidism, unspecified type Compliant with levo 88 mcg qd.    01/14/2023   10:14 AM 12/23/2022    8:24 AM 08/25/2022    3:24 PM 07/06/2022    8:20 AM 06/02/2021    1:16 PM  Depression screen PHQ 2/9  Decreased Interest 1 0 0 0 0  Down, Depressed, Hopeless 1 1 0 0 0  PHQ - 2 Score 2 1 0 0 0  Altered sleeping 2      Tired, decreased energy 2      Change in appetite 1      Feeling bad or failure about yourself  1      Trouble concentrating 3      Moving slowly or fidgety/restless 0      Suicidal thoughts 0      PHQ-9 Score 11      Difficult doing work/chores Somewhat difficult          01/14/2023  10:15 AM 06/26/2020    8:48 AM 12/25/2019    8:46 AM  GAD 7 : Generalized Anxiety Score  Nervous, Anxious, on Edge 2 1 0  Control/stop worrying 1 0 0  Worry too much - different things 1 0 0  Trouble relaxing 1 0 0  Restless 0 0 0  Easily annoyed or irritable 1 1 1   Afraid - awful might happen 1 0 0  Total GAD 7 Score 7 2 1   Anxiety Difficulty Somewhat difficult      Immunization History  Administered Date(s) Administered   Fluad Quad(high Dose 65+) 12/25/2019, 11/30/2021   Influenza Split 12/21/2010, 12/21/2011    Influenza Whole 01/04/2008, 12/31/2008, 01/14/2010   Influenza, High Dose Seasonal PF 01/03/2013, 12/26/2013, 02/12/2016, 12/28/2016, 12/05/2020, 12/01/2022   Influenza-Unspecified 12/06/2017   PFIZER Comirnaty(Gray Top)Covid-19 Tri-Sucrose Vaccine 12/23/2021   PFIZER(Purple Top)SARS-COV-2 Vaccination 04/22/2019, 05/14/2019, 01/25/2020   Pfizer Covid-19 Vaccine Bivalent Booster 88yrs & up 12/05/2020   Pfizer(Comirnaty)Fall Seasonal Vaccine 12 years and older 12/01/2022   Pneumococcal Conjugate-13 02/16/2016   Pneumococcal Polysaccharide-23 04/10/2012   Respiratory Syncytial Virus Vaccine,Recomb Aduvanted(Arexvy) 12/23/2021   Td 03/09/2001, 12/10/2008   Tdap 12/10/2008, 12/28/2016   Zoster Recombinant(Shingrix) 08/03/2017, 12/29/2017, 12/29/2017   Zoster, Live 12/21/2010     Past Medical History:  Diagnosis Date   Abnormal colonoscopy 03/2017   decreased rectal tone and polyp; 5 year recall   Abnormal findings on esophagogastroduodenoscopy (EGD) 02/25/2016   Z-line irrg. at 40 cm. No abnomrality of the esophagus to explain dysphagia. Esophagus dilated. H/o + H. Pylori   Allergy    ANXIETY 07/06/2007   Aortic atherosclerosis (HCC) 02/08/2018   Aortic valve regurgitation    Atherosclerosis of native coronary artery of native heart without angina pectoris 02/08/2018   Biceps tendonitis on right    Bronchiectasis without complication (HCC) 03/16/2011   CT 2012 IMPRESSION:  1. Apical scarring may account for the plain film abnormality.  2. A 6 mm right upper lobe pulmonary nodule.  - - HRCT 07/12/2017 >>>  Scattered minimal cylindrical and varicoid bronchiectasis with associated scattered mucoid impaction and minimal tree-in-bud opacity in both lungs, predominantly in the mid to lower lungs. Findings are stable to slightly worsened since 2015 ches   Chicken pox    Chronic cough 05/18/2017   CT chest 03/19/14  No bronchiectasis (not hrct)  Spirometry 05/18/2017  FEV1  2.15 (93%)  Ratio 76 s  rx prior  - FENO 05/18/2017  =   Could not perform   - Allergy profile 05/18/2017 >  Eos 0.2 /  IgE  75 RAST pos cat > dog > mold - HRCT 07/12/2017 >>>  See bronchiectasis - trial of dymista 07/26/2017 > some better but not consistent with it > rechallenge rx one puff each am 10/25/2017  - MCT 11/02/17 >     Colon polyp 03/31/2017   hyperplastic   COVID-19 08/04/2020   mild   Diverticulosis 03/2017   sigmoid   Family history of colon cancer 04/03/2017   Formatting of this note might be different from the original. 1/19 colon - no adenomas Recommend screening exam in 1/24 - [facility]   Family history of ovarian cancer    Genetic testing 03/26/2016   Negative genetic testing on the [facility] 30 gene panel.  The 30-Gene Cancer Panel offered by [facility] includes sequencing and/or deletion duplication testing of the following 30 genes: APC, ATM, BAP1, BARD1, BMPR1A, BRCA1, BRCA2, BRIP1, CDH1, CDK4, CDKN2A (p14ARF), CDKN2A (p16INK4a), CHEK2, EPCAM, GREM1,  MITF, MLH1, MSH2, MSH6, MUTYH, NBN, PALB2, PMS2, POLD1, POLE, PTEN, RAD51C, RAD51D, SMAD4,    GERD 11/09/2006   H. pylori infection    h/o treated wiht prevpac   Hormone replacement therapy 01/08/2019   HYPERLIPIDEMIA 11/09/2006   Hypertension    Hypothyroidism 08/03/2017   LOC (loss of consciousness) (HCC) 01/26/2022   Lumbar radiculopathy 07/24/2014   Migraine    Mild depression 02/14/2019   Pharyngoesophageal dysphagia 08/03/2017   Piriformis syndrome of right side    Spinal stenosis, lumbar region, with neurogenic claudication 10/16/2014   Epidural 10/02/2014, December 2016    Stomach ulcer 2008   Allergies  Allergen Reactions   Tetracycline Hcl Rash        Past Surgical History:  Procedure Laterality Date   CATARACT EXTRACTION Bilateral 04/2021   CERVICAL POLYPECTOMY  01/17/2019   LIPOMA EXCISION     of back   TUBAL LIGATION     Family History  Problem Relation Age of Onset   Dementia Mother    Hyperlipidemia Mother    Colon  cancer Father 54   Colon polyps Brother        gets colonoscopy every 2-3 years   Hyperlipidemia Brother    Hypertension Brother    Uterine cancer Maternal Aunt        dx in her 38s   Colon cancer Maternal Uncle    Colon cancer Paternal Aunt    Heart attack Maternal Grandfather    Cancer Other        MGMs sister - "abdominal cancer"   Breast cancer Cousin    Social History   Social History Narrative   Married. Two children.    College grad.    Former smoker.    Exercises routinely.    Drink caffeine.    Wears a hearing aid.    Smoke alarm in the home.    Wears her seatbelt.    Feels safe in her relationships.     Allergies as of 07/14/2023       Reactions   Tetracycline Hcl Rash            Medication List        Accurate as of July 14, 2023  8:31 AM. If you have any questions, ask your nurse or doctor.          acebutolol 200 MG capsule Commonly known as: SECTRAL Take 1 capsule (200 mg total) by mouth daily.   amLODipine 5 MG tablet Commonly known as: NORVASC Take 1 tablet (5 mg total) by mouth daily.   apixaban 5 MG Tabs tablet Commonly known as: Eliquis Take 1 tablet (5 mg total) by mouth 2 (two) times daily.   atorvastatin 40 MG tablet Commonly known as: LIPITOR Take 1 tablet (40 mg total) by mouth daily.   Azelastine HCl 137 MCG/SPRAY Soln Place 1 spray into both nostrils in the morning and at bedtime. What changed:  how much to take when to take this Changed by: Napolean Backbone   chlorpheniramine 4 MG tablet Commonly known as: CHLOR-TRIMETON Take 4 mg by mouth every 4 (four) hours as needed for allergies.   DIALYVITE VITAMIN D 5000 PO Take by mouth.   escitalopram 10 MG tablet Commonly known as: Lexapro Take 1 tablet (10 mg total) by mouth daily.   estradiol 0.0375 MG/24HR Commonly known as: VIVELLE-DOT Place 1 patch onto the skin 2 (two) times a week.   fluocinonide cream 0.05 % Commonly known as: LIDEX APPLY AS  DIRECTED TO  AFFECTED AREA   gabapentin 300 MG capsule Commonly known as: NEURONTIN Take 1 capsule (300 mg total) by mouth 2 (two) times daily.   levothyroxine 88 MCG tablet Commonly known as: SYNTHROID Take 1 tablet (88 mcg total) by mouth daily.   losartan 25 MG tablet Commonly known as: COZAAR Take 0.5 tablets (12.5 mg total) by mouth daily.   MAGNESIUM PO Take by mouth.   progesterone 100 MG capsule Commonly known as: PROMETRIUM Take 100 mg by mouth in the morning and at bedtime. TAKING DAILY   traZODone 50 MG tablet Commonly known as: DESYREL Take 0.5-1 tablets (25-50 mg total) by mouth at bedtime as needed for sleep. Take half tablet by mouth as needed at bedtime   VITAMIN K PO Take by mouth.        All past medical history, surgical history, allergies, family history, immunizations andmedications were updated in the EMR today and reviewed under the history and medication portions of their EMR.       ROS 14 pt review of systems performed and negative (unless mentioned in an HPI)  Objective: BP 126/76   Pulse (!) 57   Temp 97.8 F (36.6 C)   Ht 5\' 5"  (1.651 m)   Wt 126 lb (57.2 kg)   SpO2 98%   BMI 20.97 kg/m  Physical Exam Vitals and nursing note reviewed.  Constitutional:      General: She is not in acute distress.    Appearance: Normal appearance. She is not ill-appearing or toxic-appearing.  HENT:     Head: Normocephalic and atraumatic.     Right Ear: Tympanic membrane, ear canal and external ear normal. There is no impacted cerumen.     Left Ear: Tympanic membrane, ear canal and external ear normal. There is no impacted cerumen.     Nose: No congestion or rhinorrhea.     Mouth/Throat:     Mouth: Mucous membranes are moist.     Pharynx: Oropharynx is clear. No oropharyngeal exudate or posterior oropharyngeal erythema.  Eyes:     General: No scleral icterus.       Right eye: No discharge.        Left eye: No discharge.     Extraocular Movements:  Extraocular movements intact.     Conjunctiva/sclera: Conjunctivae normal.     Pupils: Pupils are equal, round, and reactive to light.  Cardiovascular:     Rate and Rhythm: Normal rate and regular rhythm.     Pulses: Normal pulses.     Heart sounds: Normal heart sounds. No murmur heard.    No friction rub. No gallop.  Pulmonary:     Effort: Pulmonary effort is normal. No respiratory distress.     Breath sounds: Normal breath sounds. No stridor. No wheezing, rhonchi or rales.  Chest:     Chest wall: No tenderness.  Abdominal:     General: Abdomen is flat. Bowel sounds are normal. There is no distension.     Palpations: Abdomen is soft. There is no mass.     Tenderness: There is no abdominal tenderness. There is no right CVA tenderness, left CVA tenderness, guarding or rebound.     Hernia: No hernia is present.  Musculoskeletal:        General: No swelling, tenderness or deformity. Normal range of motion.     Cervical back: Normal range of motion and neck supple. No rigidity or tenderness.     Right lower leg: No edema.  Left lower leg: No edema.  Lymphadenopathy:     Cervical: No cervical adenopathy.  Skin:    General: Skin is warm and dry.     Coloration: Skin is not jaundiced or pale.     Findings: No bruising, erythema, lesion or rash.  Neurological:     General: No focal deficit present.     Mental Status: She is alert and oriented to person, place, and time. Mental status is at baseline.     Cranial Nerves: No cranial nerve deficit.     Sensory: No sensory deficit.     Motor: No weakness.     Coordination: Coordination normal.     Gait: Gait normal.     Deep Tendon Reflexes: Reflexes normal.  Psychiatric:        Mood and Affect: Mood normal.        Behavior: Behavior normal.        Thought Content: Thought content normal.        Judgment: Judgment normal.      No results found.  Assessment/plan: ASSATA JUNCAJ is a 78 y.o. female present for cpe and  Chronic Conditions/illness Management Adjustment reaction with anxiety and depression/Sleep disorder stable Continue lexapro 10 mg every day.  Had been struggling with CPAP use, but compliant Continue trazodone 25-50 mg qhs   HTN/a.fib/Mixed hyperlipidemia/aortic atherosclerosis/AR/MR/CAD-h/o stroke/aquired thrombophilia-eliquis Stable Continue  amlodipine 5 mg daily continue losartan 12.5 mg daily Continue Lipitor 40 nightly Continue sectral and eliquis prescribed by cardiology.  Labs: cbc, cmp, tsh, lipids collected  Sleep disorder/OSA: Had been struggling with CPAP use, but compliant Continue Trazodone 25-50 mg qd.   Hypothyroidism due to acquired atrophy of thyroid Continue  levothyroxine 88 mcg daily Tsh collected   Chronic cough/GERD Continue gabapentin 300 mg BID Pt reports no sedation affects    Return in about 25 weeks (around 01/05/2024) for Routine chronic condition follow-up.   Orders Placed This Encounter  Procedures   CBC   Hemoglobin A1c   Comprehensive metabolic panel with GFR   Lipid panel   TSH   B12 and Folate Panel   Vitamin D (25 hydroxy)   Ambulatory referral to Speech Therapy   Meds ordered this encounter  Medications   escitalopram (LEXAPRO) 10 MG tablet    Sig: Take 1 tablet (10 mg total) by mouth daily.    Dispense:  90 tablet    Refill:  1   gabapentin (NEURONTIN) 300 MG capsule    Sig: Take 1 capsule (300 mg total) by mouth 2 (two) times daily.    Dispense:  180 capsule    Refill:  1   traZODone (DESYREL) 50 MG tablet    Sig: Take 0.5-1 tablets (25-50 mg total) by mouth at bedtime as needed for sleep. Take half tablet by mouth as needed at bedtime    Dispense:  90 tablet    Refill:  1   levothyroxine (SYNTHROID) 88 MCG tablet    Sig: Take 1 tablet (88 mcg total) by mouth daily.    Dispense:  90 tablet    Refill:  3   Azelastine HCl 137 MCG/SPRAY SOLN    Sig: Place 1 spray into both nostrils in the morning and at bedtime.     Dispense:  30 mL    Refill:  11   Referral Orders         Ambulatory referral to Speech Therapy       Electronically signed by: Napolean Backbone, DO [facility] [place]

## 2023-07-14 NOTE — Patient Instructions (Addendum)
 Return in about 25 weeks (around 01/05/2024) for Routine chronic condition follow-up.        Great to see you today.  I have refilled the medication(s) we provide.   If labs were collected or images ordered, we will inform you of  results once we have received them and reviewed. We will contact you either by echart message, or telephone call.  Please give ample time to the testing facility, and our office to run,  receive and review results. Please do not call inquiring of results, even if you can see them in your chart. We will contact you as soon as we are able. If it has been over 1 week since the test was completed, and you have not yet heard from us , then please call us .    - echart message- for normal results that have been seen by the patient already.   - telephone call: abnormal results or if patient has not viewed results in their echart.  If a referral to a specialist was entered for you, please call us  in 2 weeks if you have not heard from the specialist office to schedule.

## 2023-07-19 ENCOUNTER — Encounter: Payer: Self-pay | Admitting: Family Medicine

## 2023-07-20 ENCOUNTER — Ambulatory Visit: Attending: Family Medicine

## 2023-07-20 ENCOUNTER — Encounter: Payer: Self-pay | Admitting: Family Medicine

## 2023-07-20 DIAGNOSIS — I6389 Other cerebral infarction: Secondary | ICD-10-CM | POA: Diagnosis not present

## 2023-07-20 DIAGNOSIS — R053 Chronic cough: Secondary | ICD-10-CM | POA: Diagnosis not present

## 2023-07-20 DIAGNOSIS — R498 Other voice and resonance disorders: Secondary | ICD-10-CM | POA: Diagnosis not present

## 2023-07-20 DIAGNOSIS — F801 Expressive language disorder: Secondary | ICD-10-CM | POA: Insufficient documentation

## 2023-07-20 DIAGNOSIS — R499 Unspecified voice and resonance disorder: Secondary | ICD-10-CM | POA: Diagnosis not present

## 2023-07-20 NOTE — Therapy (Signed)
 OUTPATIENT SPEECH LANGUAGE PATHOLOGY VOICE EVALUATION   Patient Name: Yvette Short MRN: [id number] DOB:Jun 05, 1945, 78 y.o., female Today's Date: 07/20/2023  PCP: Napolean Backbone, DO REFERRING PROVIDER: Same as PCP  END OF SESSION:  End of Session - 07/20/23 1533     Visit Number 1    Number of Visits 13    Date for SLP Re-Evaluation 09/09/23   first ST on 08/02/23   SLP Start Time 1319    SLP Stop Time  1402    SLP Time Calculation (min) 43 min    Activity Tolerance Patient tolerated treatment well             Past Medical History:  Diagnosis Date   Abnormal colonoscopy 03/2017   decreased rectal tone and polyp; 5 year recall   Abnormal findings on esophagogastroduodenoscopy (EGD) 02/25/2016   Z-line irrg. at 40 cm. No abnomrality of the esophagus to explain dysphagia. Esophagus dilated. H/o + H. Pylori   Allergy     ANXIETY 07/06/2007   Aortic atherosclerosis (HCC) 02/08/2018   Aortic valve regurgitation    Atherosclerosis of native coronary artery of native heart without angina pectoris 02/08/2018   Biceps tendonitis on right    Bronchiectasis without complication (HCC) 03/16/2011   CT 2012 IMPRESSION:  1. Apical scarring may account for the plain film abnormality.  2. A 6 mm right upper lobe pulmonary nodule.  - - HRCT 07/12/2017 >>>  Scattered minimal cylindrical and varicoid bronchiectasis with associated scattered mucoid impaction and minimal tree-in-bud opacity in both lungs, predominantly in the mid to lower lungs. Findings are stable to slightly worsened since 2015 ches   Chicken pox    Chronic cough 05/18/2017   CT chest 03/19/14  No bronchiectasis (not hrct)  Spirometry 05/18/2017  FEV1  2.15 (93%)  Ratio 76 s rx prior  - FENO 05/18/2017  =   Could not perform   - Allergy  profile 05/18/2017 >  Eos 0.2 /  IgE  75 RAST pos cat > dog > mold - HRCT 07/12/2017 >>>  See bronchiectasis - trial of dymista  07/26/2017 > some better but not consistent with it > rechallenge rx  one puff each am 10/25/2017  - MCT 11/02/17 >     Colon polyp 03/31/2017   hyperplastic   COVID-19 08/04/2020   mild   Diverticulosis 03/2017   sigmoid   Family history of colon cancer 04/03/2017   Formatting of this note might be different from the original. 1/19 colon - no adenomas Recommend screening exam in 1/24 - [facility]   Family history of ovarian cancer    Genetic testing 03/26/2016   Negative genetic testing on the [facility] 30 gene panel.  The 30-Gene Cancer Panel offered by [facility] includes sequencing and/or deletion duplication testing of the following 30 genes: APC, ATM, BAP1, BARD1, BMPR1A, BRCA1, BRCA2, BRIP1, CDH1, CDK4, CDKN2A (p14ARF), CDKN2A (p16INK4a), CHEK2, EPCAM, GREM1, MITF, MLH1, MSH2, MSH6, MUTYH, NBN, PALB2, PMS2, POLD1, POLE, PTEN, RAD51C, RAD51D, SMAD4,    GERD 11/09/2006   H. pylori infection    h/o treated wiht prevpac   Hormone replacement therapy 01/08/2019   HYPERLIPIDEMIA 11/09/2006   Hypertension    Hypothyroidism 08/03/2017   LOC (loss of consciousness) (HCC) 01/26/2022   Lumbar radiculopathy 07/24/2014   Migraine    Mild depression 02/14/2019   Pharyngoesophageal dysphagia 08/03/2017   Piriformis syndrome of right side    Spinal stenosis, lumbar region, with neurogenic claudication 10/16/2014   Epidural 10/02/2014, December 2016  Stomach ulcer 2008   Past Surgical History:  Procedure Laterality Date   CATARACT EXTRACTION Bilateral 04/2021   CERVICAL POLYPECTOMY  01/17/2019   LIPOMA EXCISION     of back   TUBAL LIGATION     Patient Active Problem List   Diagnosis Date Noted   Adjustment reaction with anxiety and depression 01/14/2023   ELIQUIS -Acquired thrombophilia (HCC) 12/23/2022   Cerebrovascular accident (HCC) 07/06/2022   Atrial fibrillation (HCC) 07/06/2022   Obstructive sleep apnea on CPAP 01/27/2022   Bradycardia with 51-60 beats per minute 01/26/2022   Sleep disorder 12/17/2021   Rotator cuff tendinitis, right 10/09/2021    Memory deficit MMSE 30/30 06/30/2021   H. pylori infection    Moderate aortic regurgitation 10/17/2019   Non-sustained ventricular tachycardia (HCC) 09/04/2019   Rheumatic aortic valve insufficiency 09/04/2019   Hormone replacement therapy 01/08/2019   Aortic atherosclerosis (HCC) 02/08/2018   Atherosclerosis of native coronary artery of native heart without angina pectoris 02/08/2018   Hypothyroidism 08/03/2017   Pharyngoesophageal dysphagia 08/03/2017   Chronic cough 05/18/2017   Spinal stenosis, lumbar region, with neurogenic claudication 10/16/2014   Lumbar radiculopathy 07/24/2014   Bronchiectasis without complication (HCC) 03/16/2011   Essential hypertension 12/21/2010   Hyperlipidemia 11/09/2006   GERD 11/09/2006   Stomach ulcer 2008    Onset date: "probably 6-7 years ago"; script dated 07/14/23  REFERRING DIAG: 05.3 (ICD-10-CM) - Chronic cough I63.89 (ICD-10-CM) - Cerebrovascular accident (CVA) due to other mechanism (HCC) R49.9 (ICD-10-CM) - Change in voice  THERAPY DIAG:  Other voice and resonance disorders  Rationale for Evaluation and Treatment: Rehabilitation  SUBJECTIVE:   SUBJECTIVE STATEMENT: "(After last course of ST) It never totally went away." "Sometimes I feel (the cough) coming on for a minute." "Yeah, some smells bother me a lot."  Pt accompanied by: self  PERTINENT HISTORY: Pt with CVA in 2024 and had short course of ST treating expressive communication when pt mentioned cough as well. SLP-Johnson addressed this in last few sessions with pt.  PAIN:  Are you having pain? No  FALLS: Has patient fallen in last 6 months? No,   LIVING ENVIRONMENT: Lives with: lives with their family Lives in: House/apartment  PLOF:Level of assistance: Independent with ADLs, Independent with IADLs Employment: Retired  PATIENT GOALS: "Get rid of this cough."  OBJECTIVE:  Note: Objective measures were completed at Evaluation unless otherwise  noted.  DIAGNOSTIC FINDINGS:  PCP - 07/14/23 Mild depression and worry: Patient reports she feels the Lexapro  10 mg has been helpful.  She is taking the trazodone  to help with sleep nightly. Prior note: pt reports she feels she is worrying more about her health and future. She endorses feeling down in the dumps. She is not sleeping well. Trazodone  helps some. She feels her bad days are now outweighing her good days.  She had been on effexor  a few years ago, mostly for MSK complaints, but she noticed a positive effect in her emotions on medication. She reports she had a hard time when she tried to come off that medication.   Chronic cough/voice change: Working well Gabapentin  300 mg twice daily   HTN/a.fib/Mixed hyperlipidemia/aortic atherosclerosis/AR/MR/CAD-h/o stroke Pt reports compliance with losartan12.5 mg qd, amlodipine  5 mg qd, eliquis , lipitor and sectral   Patient denies chest pain, shortness of breath, dizziness or lower extremity edema.  Speech therapy referral placed   Established with cardio. RF: [email or web address] CAD.stroke 08/30/2020-echo: Moderate mitral regurgitation, mild to moderate aortic regurgitation, mild to moderate tricuspid regurgitation. The right-sided pressures -  mildly elevated.   Hypothyroidism, unspecified type Compliant with levo 88 mcg qd.  COGNITION: Overall cognitive status: Within functional limits for tasks assessed  SOCIAL HISTORY: Occupation: retired Daily voice use: minimal and moderate  PERCEPTUAL VOICE ASSESSMENT: Voice quality: normal Vocal abuse:  no behaviors noted today Resonance: normal Respiratory function: thoracic breathing and clavicular breathing. Pt spoke on residual volume once and this triggered 4 coughs for a period of 12 seconds.  OBJECTIVE VOICE ASSESSMENT: Maximum phonation time for sustained "ah": 19.54 seconds Conversational pitch average: 186 Hz - WNL Conversational pitch range: 174-294 Hz Conversational loudness average:  56-78 dB Conversational loudness range: 69 dB -idB from WNL - pt talking softly due to talking exacerbates VCD and chronic cough sx S/z ratio: 0.63 (Suggestive of dysfunction >1.0)  PATIENT REPORTED OUTCOME MEASURES (PROM): VCD-Q to be provided in first 1-2 sessions                                                                                                                            TREATMENT DATE:  Abdominal breathing=AB, Vocal Cord Dysfunction=VCD  07/20/23: SLP and pt discussed compensations for s/sx of VCD and chronic cough including visualization, and AB. SLP discussed s/sx of VCD and how AB decreases frequency of s/sx VCD and chronic cough. Pt generated her scene of relaxation which included a childhood "green space" with a small pond and a sitting area.   PATIENT EDUCATION: Education details: see "treatment date" Person educated: Patient Education method: Explanation, Demonstration, Verbal cues, and Handouts Education comprehension: verbalized understanding, returned demonstration, verbal cues required, and needs further education  HOME EXERCISE PROGRAM: VCD techniques, and AB(eventually)  GOALS: Goals reviewed with patient? Yes  SHORT TERM GOALS: Target date: 08/19/23   Pt will complete PROM Baseline: Goal status: INITIAL   2.  Pt will demo 2 compensations she can use for s/sx VCD in 2 sessions Baseline:  Goal status: INITIAL   3.  Pt will report using one compensation (visualization, sniff sniff blow, pursed lip breathing, etc) successfully to decr severity of VCD between 2 sessions Baseline:  Goal status: INITIAL   4.  Pt will demo AB in sentence responses 80% of the time in two sessions Baseline:  Goal status: INITIAL     LONG TERM GOALS: Target date: 09/16/23   Pt will improve PROM compared to initial administration Baseline:  Goal status: INITIAL   2.  Pt will report using one compensation (visualization, sniff sniff blow, pursed lip breathing, etc)  successfully to decr severity of VCD between 3 sessions, after 07/29/23 Baseline:  Goal status: INITIAL   3.  Pt will demo AB 80% of the time in 10 minutes conversation in three sessions Baseline:  Goal status: INITIAL   4.  Pt will decr coughing and throat clearing to 1 instance, in three sessions Baseline:  Goal status: INITIAL     ASSESSMENT:   CLINICAL IMPRESSION: Patient is a 78 y.o. F who was seen today  for assessment of voice function in light of chronic cough dx, and reported sx not unlike those of pt with VCD. [place] stated that she is hindered in communication both by fear of coughing while in conversation as she tells SLP that "I'm afraid it might happen when I'm talking with someone." And is hindered in communication in that talking generates the coughing. Today pt cleared her throat 2 times and coughed once, when talking on residual volume. Pt's sx are frequent cough/throat clearing, feeling of choking or suffocation, noisy breathing, and hoarse voice. Pt's triggers that generate feeling of need to cough and clear throat are reported as strong smells and odors, post-nasal drip, strong emotions/stress, and voice usage. She would benefit from skilled ST focusing on AB and habituating use of compensations for minimizing or eliminating sx of chronic cough and those not unlike sx of VCD.    OBJECTIVE IMPAIRMENTS: include voice disorder. These impairments are limiting patient from household responsibilities, ADLs/IADLs, and effectively communicating at home and in community. Factors affecting potential to achieve goals and functional outcome are  none noted .Aaron Aas Patient will benefit from skilled SLP services to address above impairments and improve overall function.   REHAB POTENTIAL: Excellent   PLAN:   SLP FREQUENCY: 1-2x/week   SLP DURATION: 8 weeks   PLANNED INTERVENTIONS: Internal/external aids, Functional tasks, SLP instruction and feedback, Compensatory strategies,  Patient/family education, and [address] Treatment of speech (30 or 45 min)    Art Levan, CCC-SLP 07/20/2023, 5:05 PM   REFERRING DIAG: 05.3 (ICD-10-CM) - Chronic cough I63.89 (ICD-10-CM) - Cerebrovascular accident (CVA) due to other mechanism (HCC) R49.9 (ICD-10-CM) - Change in voice  THERAPY DIAG:  Other voice and resonance disorders  What was this (referring dx) caused by? []  Surgery []  Fall []  Ongoing issue []  Arthritis []  Other: ____________  Laterality: []  Rt []  Lt []  Both  Check all possible CPT codes:  *CHOOSE 10 OR LESS*    See Planned Interventions listed in the Plan section of the Evaluation.

## 2023-07-25 ENCOUNTER — Ambulatory Visit
Admission: RE | Admit: 2023-07-25 | Discharge: 2023-07-25 | Disposition: A | Discharge status: Home / Self Care | Source: Ambulatory Visit | Attending: Obstetrics and Gynecology | Admitting: Obstetrics and Gynecology

## 2023-07-25 DIAGNOSIS — Z1231 Encounter for screening mammogram for malignant neoplasm of breast: Secondary | ICD-10-CM

## 2023-07-26 ENCOUNTER — Ambulatory Visit

## 2023-07-26 DIAGNOSIS — R498 Other voice and resonance disorders: Secondary | ICD-10-CM

## 2023-07-26 DIAGNOSIS — R053 Chronic cough: Secondary | ICD-10-CM | POA: Diagnosis not present

## 2023-07-26 DIAGNOSIS — R499 Unspecified voice and resonance disorder: Secondary | ICD-10-CM | POA: Diagnosis not present

## 2023-07-26 DIAGNOSIS — F801 Expressive language disorder: Secondary | ICD-10-CM | POA: Diagnosis not present

## 2023-07-26 DIAGNOSIS — I6389 Other cerebral infarction: Secondary | ICD-10-CM | POA: Diagnosis not present

## 2023-07-26 NOTE — Patient Instructions (Addendum)
    What to Do Instead of Clearing Your Throat Use these strategies instead of clearing your throat: 1. Swallow your saliva. Swallow as hard as you can.  2. Take a drink of water. 3. Suck on ice chips. 4. Use a silent cough. Whisper the word "huh" from your belly without making a sound and then swallow. This is like a cough but without using your voice. 5. Hum on an "M" and then swallow. 6. Use a light, gentle cough (like tapping your vocal folds together) and then swallow. 7. Silently count to 10 and then swallow   Alginate therapy  [email or web address]  [email or web address]

## 2023-07-26 NOTE — Therapy (Signed)
 OUTPATIENT SPEECH LANGUAGE PATHOLOGY VOICE TREATMENT   Patient Name: Yvette Short MRN: [id number] DOB:1945-04-21, 78 y.o., female Today's Date: 07/26/2023  PCP: Napolean Backbone, DO REFERRING PROVIDER: Same as PCP  END OF SESSION:  End of Session - 07/26/23 1209     Visit Number 2    Number of Visits 13    Date for SLP Re-Evaluation 09/09/23   first ST on 08/02/23   SLP Start Time 0934    SLP Stop Time  1015    SLP Time Calculation (min) 41 min    Activity Tolerance Patient tolerated treatment well              Past Medical History:  Diagnosis Date   Abnormal colonoscopy 03/2017   decreased rectal tone and polyp; 5 year recall   Abnormal findings on esophagogastroduodenoscopy (EGD) 02/25/2016   Z-line irrg. at 40 cm. No abnomrality of the esophagus to explain dysphagia. Esophagus dilated. H/o + H. Pylori   Allergy     ANXIETY 07/06/2007   Aortic atherosclerosis (HCC) 02/08/2018   Aortic valve regurgitation    Atherosclerosis of native coronary artery of native heart without angina pectoris 02/08/2018   Biceps tendonitis on right    Bronchiectasis without complication (HCC) 03/16/2011   CT 2012 IMPRESSION:  1. Apical scarring may account for the plain film abnormality.  2. A 6 mm right upper lobe pulmonary nodule.  - - HRCT 07/12/2017 >>>  Scattered minimal cylindrical and varicoid bronchiectasis with associated scattered mucoid impaction and minimal tree-in-bud opacity in both lungs, predominantly in the mid to lower lungs. Findings are stable to slightly worsened since 2015 ches   Chicken pox    Chronic cough 05/18/2017   CT chest 03/19/14  No bronchiectasis (not hrct)  Spirometry 05/18/2017  FEV1  2.15 (93%)  Ratio 76 s rx prior  - FENO 05/18/2017  =   Could not perform   - Allergy  profile 05/18/2017 >  Eos 0.2 /  IgE  75 RAST pos cat > dog > mold - HRCT 07/12/2017 >>>  See bronchiectasis - trial of dymista  07/26/2017 > some better but not consistent with it > rechallenge rx  one puff each am 10/25/2017  - MCT 11/02/17 >     Colon polyp 03/31/2017   hyperplastic   COVID-19 08/04/2020   mild   Diverticulosis 03/2017   sigmoid   Family history of colon cancer 04/03/2017   Formatting of this note might be different from the original. 1/19 colon - no adenomas Recommend screening exam in 1/24 - [facility]   Family history of ovarian cancer    Genetic testing 03/26/2016   Negative genetic testing on the [facility] 30 gene panel.  The 30-Gene Cancer Panel offered by [facility] includes sequencing and/or deletion duplication testing of the following 30 genes: APC, ATM, BAP1, BARD1, BMPR1A, BRCA1, BRCA2, BRIP1, CDH1, CDK4, CDKN2A (p14ARF), CDKN2A (p16INK4a), CHEK2, EPCAM, GREM1, MITF, MLH1, MSH2, MSH6, MUTYH, NBN, PALB2, PMS2, POLD1, POLE, PTEN, RAD51C, RAD51D, SMAD4,    GERD 11/09/2006   H. pylori infection    h/o treated wiht prevpac   Hormone replacement therapy 01/08/2019   HYPERLIPIDEMIA 11/09/2006   Hypertension    Hypothyroidism 08/03/2017   LOC (loss of consciousness) (HCC) 01/26/2022   Lumbar radiculopathy 07/24/2014   Migraine    Mild depression 02/14/2019   Pharyngoesophageal dysphagia 08/03/2017   Piriformis syndrome of right side    Spinal stenosis, lumbar region, with neurogenic claudication 10/16/2014   Epidural 10/02/2014, December 2016  Stomach ulcer 2008   Past Surgical History:  Procedure Laterality Date   CATARACT EXTRACTION Bilateral 04/2021   CERVICAL POLYPECTOMY  01/17/2019   LIPOMA EXCISION     of back   TUBAL LIGATION     Patient Active Problem List   Diagnosis Date Noted   Adjustment reaction with anxiety and depression 01/14/2023   ELIQUIS -Acquired thrombophilia (HCC) 12/23/2022   Cerebrovascular accident (HCC) 07/06/2022   Atrial fibrillation (HCC) 07/06/2022   Obstructive sleep apnea on CPAP 01/27/2022   Bradycardia with 51-60 beats per minute 01/26/2022   Sleep disorder 12/17/2021   Rotator cuff tendinitis, right 10/09/2021    Memory deficit MMSE 30/30 06/30/2021   H. pylori infection    Moderate aortic regurgitation 10/17/2019   Non-sustained ventricular tachycardia (HCC) 09/04/2019   Rheumatic aortic valve insufficiency 09/04/2019   Hormone replacement therapy 01/08/2019   Aortic atherosclerosis (HCC) 02/08/2018   Atherosclerosis of native coronary artery of native heart without angina pectoris 02/08/2018   Hypothyroidism 08/03/2017   Pharyngoesophageal dysphagia 08/03/2017   Chronic cough 05/18/2017   Spinal stenosis, lumbar region, with neurogenic claudication 10/16/2014   Lumbar radiculopathy 07/24/2014   Bronchiectasis without complication (HCC) 03/16/2011   Essential hypertension 12/21/2010   Hyperlipidemia 11/09/2006   GERD 11/09/2006   Stomach ulcer 2008    Onset date: "probably 6-7 years ago"; script dated 07/14/23  REFERRING DIAG: 05.3 (ICD-10-CM) - Chronic cough I63.89 (ICD-10-CM) - Cerebrovascular accident (CVA) due to other mechanism (HCC) R49.9 (ICD-10-CM) - Change in voice  THERAPY DIAG:  Other voice and resonance disorders  Rationale for Evaluation and Treatment: Rehabilitation  SUBJECTIVE:   SUBJECTIVE STATEMENT: "I thought of the vocal box relaxing." (Pt, re: compensatory strategy she used successfully last week)  Pt accompanied by: self  PERTINENT HISTORY: Pt with CVA in 2024 and had short course of ST treating expressive communication when pt mentioned cough as well. SLP-Johnson addressed this in last few sessions with pt.  PAIN:  Are you having pain? No  FALLS: Has patient fallen in last 6 months? No,   LIVING ENVIRONMENT: Lives with: lives with their family Lives in: House/apartment  PLOF:Level of assistance: Independent with ADLs, Independent with IADLs Employment: Retired  PATIENT GOALS: "Get rid of this cough."  OBJECTIVE:  Note: Objective measures were completed at Evaluation unless otherwise noted.  DIAGNOSTIC FINDINGS:  PCP - 07/14/23 Mild depression  and worry: Patient reports she feels the Lexapro  10 mg has been helpful.  She is taking the trazodone  to help with sleep nightly. Prior note: pt reports she feels she is worrying more about her health and future. She endorses feeling down in the dumps. She is not sleeping well. Trazodone  helps some. She feels her bad days are now outweighing her good days.  She had been on effexor  a few years ago, mostly for MSK complaints, but she noticed a positive effect in her emotions on medication. She reports she had a hard time when she tried to come off that medication.   Chronic cough/voice change: Working well Gabapentin  300 mg twice daily   HTN/a.fib/Mixed hyperlipidemia/aortic atherosclerosis/AR/MR/CAD-h/o stroke Pt reports compliance with losartan12.5 mg qd, amlodipine  5 mg qd, eliquis , lipitor and sectral   Patient denies chest pain, shortness of breath, dizziness or lower extremity edema.  Speech therapy referral placed   Established with cardio. RF: [email or web address] CAD.stroke 08/30/2020-echo: Moderate mitral regurgitation, mild to moderate aortic regurgitation, mild to moderate tricuspid regurgitation. The right-sided pressures - mildly elevated.   Hypothyroidism, unspecified type Compliant with levo 88  mcg qd.   PATIENT REPORTED OUTCOME MEASURES (PROM): VCD-Q to be provided in first 1-2 sessions                                                                                                                            TREATMENT DATE:  Abdominal breathing=AB, Vocal Cord Dysfunction=VCD  07/26/23: NEEDS PROM NEXT SESSION. Discussed with pt her coughing "spell" occurring at [employer] almost every session. SLP asked and learned pt is horizontal and ?s role of GERD on pt's cough. Suggested she try alginate therapy and provided web addresses for Reflux [employer] and [employer]. SLP noted pt clearing throat 6 times and cough 2 times in first 15 minutes. SLP introduced some alternatives to throat  clearing. SLP and pt reviewed these and SLP provided examples and pt then imitated SLP correctly.  Lastly, SLP taught pt how to produce AB. She was 90% successful at rest over 2 minutes. SLP told pt to practice AB 15 minutes BID.  07/20/23: SLP and pt discussed compensations for s/sx of VCD and chronic cough including visualization, and AB. SLP discussed s/sx of VCD and how AB decreases frequency of s/sx VCD and chronic cough. Pt generated her scene of relaxation which included a childhood "green space" with a small pond and a sitting area.   PATIENT EDUCATION: Education details: see "treatment date" Person educated: Patient Education method: Explanation, Demonstration, Verbal cues, and Handouts Education comprehension: verbalized understanding, returned demonstration, verbal cues required, and needs further education  HOME EXERCISE PROGRAM: VCD techniques, and AB(eventually)  GOALS: Goals reviewed with patient? Yes  SHORT TERM GOALS: Target date: 08/19/23   Pt will complete PROM Baseline: Goal status: INITIAL   2.  Pt will demo 2 compensations she can use for s/sx VCD in 2 sessions Baseline:  Goal status: INITIAL   3.  Pt will report using one compensation (visualization, sniff sniff blow, pursed lip breathing, etc) successfully to decr severity of VCD between 2 sessions Baseline:  Goal status: INITIAL   4.  Pt will demo AB in sentence responses 80% of the time in two sessions Baseline:  Goal status: INITIAL     LONG TERM GOALS: Target date: 09/16/23   Pt will improve PROM compared to initial administration Baseline:  Goal status: INITIAL   2.  Pt will report using one compensation (visualization, sniff sniff blow, pursed lip breathing, etc) successfully to decr severity of VCD between 3 sessions, after 07/29/23 Baseline:  Goal status: INITIAL   3.  Pt will demo AB 80% of the time in 10 minutes conversation in three sessions Baseline:  Goal status: INITIAL   4.  Pt will  decr coughing and throat clearing to 1 instance, in three sessions Baseline:  Goal status: INITIAL     ASSESSMENT:   CLINICAL IMPRESSION: Patient is a 78 y.o. F who was seen today for treatment of voice function in light of chronic cough dx, and reported sx not unlike those  of pt with VCD. See "treatment date" above for today's date for further details on today's session. Amneris stated that she is hindered in communication both by fear of coughing while in conversation as she tells SLP that "I'm afraid it might happen when I'm talking with someone." And is hindered in communication in that talking generates the coughing. She would cont to benefit from skilled ST focusing on AB and habituating use of compensations for minimizing or eliminating sx of chronic cough and those not unlike sx of VCD.    OBJECTIVE IMPAIRMENTS: include voice disorder. These impairments are limiting patient from household responsibilities, ADLs/IADLs, and effectively communicating at home and in community. Factors affecting potential to achieve goals and functional outcome are  none noted .Aaron Aas Patient will benefit from skilled SLP services to address above impairments and improve overall function.   REHAB POTENTIAL: Excellent   PLAN:   SLP FREQUENCY: 1-2x/week   SLP DURATION: 8 weeks   PLANNED INTERVENTIONS: Internal/external aids, Functional tasks, SLP instruction and feedback, Compensatory strategies, Patient/family education, and [id number] Treatment of speech (30 or 45 min)    Joannie Medine, CCC-SLP 07/26/2023, 12:10 PM   REFERRING DIAG: 05.3 (ICD-10-CM) - Chronic cough I63.89 (ICD-10-CM) - Cerebrovascular accident (CVA) due to other mechanism (HCC) R49.9 (ICD-10-CM) - Change in voice  THERAPY DIAG:  Other voice and resonance disorders  What was this (referring dx) caused by? []  Surgery []  Fall []  Ongoing issue []  Arthritis []  Other: ____________  Laterality: []  Rt []  Lt []  Both  Check all possible CPT  codes:  *CHOOSE 10 OR LESS*    See Planned Interventions listed in the Plan section of the Evaluation.

## 2023-07-27 ENCOUNTER — Ambulatory Visit

## 2023-07-27 DIAGNOSIS — I6389 Other cerebral infarction: Secondary | ICD-10-CM | POA: Diagnosis not present

## 2023-07-27 DIAGNOSIS — R499 Unspecified voice and resonance disorder: Secondary | ICD-10-CM | POA: Diagnosis not present

## 2023-07-27 DIAGNOSIS — F801 Expressive language disorder: Secondary | ICD-10-CM | POA: Diagnosis not present

## 2023-07-27 DIAGNOSIS — R498 Other voice and resonance disorders: Secondary | ICD-10-CM | POA: Diagnosis not present

## 2023-07-27 DIAGNOSIS — R053 Chronic cough: Secondary | ICD-10-CM | POA: Diagnosis not present

## 2023-07-27 NOTE — Therapy (Signed)
 OUTPATIENT SPEECH LANGUAGE PATHOLOGY VOICE TREATMENT   Patient Name: Yvette Short MRN: [id number] DOB:Oct 03, 1945, 78 y.o., female Today's Date: 07/27/2023  PCP: Napolean Backbone, DO REFERRING PROVIDER: Same as PCP  END OF SESSION:  End of Session - 07/27/23 1454     Visit Number 3    Number of Visits 13    Date for SLP Re-Evaluation 09/09/23   first ST on 08/02/23   SLP Start Time 1405    SLP Stop Time  1448    SLP Time Calculation (min) 43 min    Activity Tolerance Patient tolerated treatment well               Past Medical History:  Diagnosis Date   Abnormal colonoscopy 03/2017   decreased rectal tone and polyp; 5 year recall   Abnormal findings on esophagogastroduodenoscopy (EGD) 02/25/2016   Z-line irrg. at 40 cm. No abnomrality of the esophagus to explain dysphagia. Esophagus dilated. H/o + H. Pylori   Allergy     ANXIETY 07/06/2007   Aortic atherosclerosis (HCC) 02/08/2018   Aortic valve regurgitation    Atherosclerosis of native coronary artery of native heart without angina pectoris 02/08/2018   Biceps tendonitis on right    Bronchiectasis without complication (HCC) 03/16/2011   CT 2012 IMPRESSION:  1. Apical scarring may account for the plain film abnormality.  2. A 6 mm right upper lobe pulmonary nodule.  - - HRCT 07/12/2017 >>>  Scattered minimal cylindrical and varicoid bronchiectasis with associated scattered mucoid impaction and minimal tree-in-bud opacity in both lungs, predominantly in the mid to lower lungs. Findings are stable to slightly worsened since 2015 ches   Chicken pox    Chronic cough 05/18/2017   CT chest 03/19/14  No bronchiectasis (not hrct)  Spirometry 05/18/2017  FEV1  2.15 (93%)  Ratio 76 s rx prior  - FENO 05/18/2017  =   Could not perform   - Allergy  profile 05/18/2017 >  Eos 0.2 /  IgE  75 RAST pos cat > dog > mold - HRCT 07/12/2017 >>>  See bronchiectasis - trial of dymista  07/26/2017 > some better but not consistent with it > rechallenge  rx one puff each am 10/25/2017  - MCT 11/02/17 >     Colon polyp 03/31/2017   hyperplastic   COVID-19 08/04/2020   mild   Diverticulosis 03/2017   sigmoid   Family history of colon cancer 04/03/2017   Formatting of this note might be different from the original. 1/19 colon - no adenomas Recommend screening exam in 1/24 - [facility]   Family history of ovarian cancer    Genetic testing 03/26/2016   Negative genetic testing on the [facility] 30 gene panel.  The 30-Gene Cancer Panel offered by [facility] includes sequencing and/or deletion duplication testing of the following 30 genes: APC, ATM, BAP1, BARD1, BMPR1A, BRCA1, BRCA2, BRIP1, CDH1, CDK4, CDKN2A (p14ARF), CDKN2A (p16INK4a), CHEK2, EPCAM, GREM1, MITF, MLH1, MSH2, MSH6, MUTYH, NBN, PALB2, PMS2, POLD1, POLE, PTEN, RAD51C, RAD51D, SMAD4,    GERD 11/09/2006   H. pylori infection    h/o treated wiht prevpac   Hormone replacement therapy 01/08/2019   HYPERLIPIDEMIA 11/09/2006   Hypertension    Hypothyroidism 08/03/2017   LOC (loss of consciousness) (HCC) 01/26/2022   Lumbar radiculopathy 07/24/2014   Migraine    Mild depression 02/14/2019   Pharyngoesophageal dysphagia 08/03/2017   Piriformis syndrome of right side    Spinal stenosis, lumbar region, with neurogenic claudication 10/16/2014   Epidural 10/02/2014, December 2016  Stomach ulcer 2008   Past Surgical History:  Procedure Laterality Date   CATARACT EXTRACTION Bilateral 04/2021   CERVICAL POLYPECTOMY  01/17/2019   LIPOMA EXCISION     of back   TUBAL LIGATION     Patient Active Problem List   Diagnosis Date Noted   Adjustment reaction with anxiety and depression 01/14/2023   ELIQUIS -Acquired thrombophilia (HCC) 12/23/2022   Cerebrovascular accident (HCC) 07/06/2022   Atrial fibrillation (HCC) 07/06/2022   Obstructive sleep apnea on CPAP 01/27/2022   Bradycardia with 51-60 beats per minute 01/26/2022   Sleep disorder 12/17/2021   Rotator cuff tendinitis, right  10/09/2021   Memory deficit MMSE 30/30 06/30/2021   H. pylori infection    Moderate aortic regurgitation 10/17/2019   Non-sustained ventricular tachycardia (HCC) 09/04/2019   Rheumatic aortic valve insufficiency 09/04/2019   Hormone replacement therapy 01/08/2019   Aortic atherosclerosis (HCC) 02/08/2018   Atherosclerosis of native coronary artery of native heart without angina pectoris 02/08/2018   Hypothyroidism 08/03/2017   Pharyngoesophageal dysphagia 08/03/2017   Chronic cough 05/18/2017   Spinal stenosis, lumbar region, with neurogenic claudication 10/16/2014   Lumbar radiculopathy 07/24/2014   Bronchiectasis without complication (HCC) 03/16/2011   Essential hypertension 12/21/2010   Hyperlipidemia 11/09/2006   GERD 11/09/2006   Stomach ulcer 2008    Onset date: "probably 6-7 years ago"; script dated 07/14/23  REFERRING DIAG: 05.3 (ICD-10-CM) - Chronic cough I63.89 (ICD-10-CM) - Cerebrovascular accident (CVA) due to other mechanism (HCC) R49.9 (ICD-10-CM) - Change in voice  THERAPY DIAG:  Other voice and resonance disorders  Expressive language impairment  Rationale for Evaluation and Treatment: Rehabilitation  SUBJECTIVE:   SUBJECTIVE STATEMENT: "I thought of the vocal box relaxing." (Pt, re: compensatory strategy she used successfully last week)  Pt accompanied by: self  PERTINENT HISTORY: Pt with CVA in 2024 and had short course of ST treating expressive communication when pt mentioned cough as well. SLP-Johnson addressed this in last few sessions with pt.  PAIN:  Are you having pain? No  FALLS: Has patient fallen in last 6 months? No,   LIVING ENVIRONMENT: Lives with: lives with their family Lives in: House/apartment  PLOF:Level of assistance: Independent with ADLs, Independent with IADLs Employment: Retired  PATIENT GOALS: "Get rid of this cough."  OBJECTIVE:  Note: Objective measures were completed at Evaluation unless otherwise noted.  DIAGNOSTIC  FINDINGS:  PCP - 07/14/23 Mild depression and worry: Patient reports she feels the Lexapro  10 mg has been helpful.  She is taking the trazodone  to help with sleep nightly. Prior note: pt reports she feels she is worrying more about her health and future. She endorses feeling down in the dumps. She is not sleeping well. Trazodone  helps some. She feels her bad days are now outweighing her good days.  She had been on effexor  a few years ago, mostly for MSK complaints, but she noticed a positive effect in her emotions on medication. She reports she had a hard time when she tried to come off that medication.   Chronic cough/voice change: Working well Gabapentin  300 mg twice daily   HTN/a.fib/Mixed hyperlipidemia/aortic atherosclerosis/AR/MR/CAD-h/o stroke Pt reports compliance with losartan12.5 mg qd, amlodipine  5 mg qd, eliquis , lipitor and sectral   Patient denies chest pain, shortness of breath, dizziness or lower extremity edema.  Speech therapy referral placed   Established with cardio. RF: htn.hld.fhx CAD.stroke 08/30/2020-echo: Moderate mitral regurgitation, mild to moderate aortic regurgitation, mild to moderate tricuspid regurgitation. The right-sided pressures - mildly elevated.   Hypothyroidism, unspecified type  Compliant with levo 88 mcg qd.   PATIENT REPORTED OUTCOME MEASURES (PROM): VCD-Q provided and pt scored 40/60 with higher scores correlating with sx of VCD causing decr'd QOL.                                                                                                                            TREATMENT DATE:  Abdominal breathing=AB, Vocal Cord Dysfunction=VCD  07/27/23: Pt told SLP 3 wyas to compensate for s/sx VCD. Pt filled out VCD-Q today with above results. She used AB for 3 minutes with excellent success. She req'd min A rarely for using AB with simple "wh" questions and yes/no with AB 90% success. SLP told pt to set alarm on her watch for a time she knows she will  talk, and try to talk with AB for 10 minutes after the alarm.  07/26/23: NEEDS PROM NEXT SESSION. Discussed with pt her coughing "spell" occurring at [employer] almost every session. SLP asked and learned pt is horizontal and ?s role of GERD on pt's cough. Suggested she try alginate therapy and provided web addresses for [employer] and [employer]. SLP noted pt clearing throat 6 times and cough 2 times in first 15 minutes. SLP introduced some alternatives to throat clearing. SLP and pt reviewed these and SLP provided examples and pt then imitated SLP correctly.  Lastly, SLP taught pt how to produce AB. She was 90% successful at rest over 2 minutes. SLP told pt to practice AB 15 minutes BID.  07/20/23: SLP and pt discussed compensations for s/sx of VCD and chronic cough including visualization, and AB. SLP discussed s/sx of VCD and how AB decreases frequency of s/sx VCD and chronic cough. Pt generated her scene of relaxation which included a childhood "green space" with a small pond and a sitting area.   PATIENT EDUCATION: Education details: see "treatment date" Person educated: Patient Education method: Explanation, Demonstration, Verbal cues, and Handouts Education comprehension: verbalized understanding, returned demonstration, verbal cues required, and needs further education  HOME EXERCISE PROGRAM: VCD techniques, and AB(eventually)  GOALS: Goals reviewed with patient? Yes  SHORT TERM GOALS: Target date: 08/19/23   Pt will complete PROM Baseline: Goal status: met   2.  Pt will demo 2 compensations she can use for s/sx VCD in 2 sessions Baseline: 07/27/23 Goal status: INITIAL   3.  Pt will report using one compensation (visualization, sniff sniff blow, pursed lip breathing, etc) successfully to decr severity of VCD between 2 sessions Baseline:  Goal status: INITIAL   4.  Pt will demo AB in sentence responses 80% of the time in two sessions Baseline:  Goal status: INITIAL      LONG TERM GOALS: Target date: 09/16/23   Pt will improve PROM compared to initial administration Baseline:  Goal status: INITIAL   2.  Pt will report using one compensation (visualization, sniff sniff blow, pursed lip breathing, etc) successfully to decr severity of VCD between 3 sessions,  after 07/29/23 Baseline:  Goal status: INITIAL   3.  Pt will demo AB 80% of the time in 10 minutes conversation in three sessions Baseline:  Goal status: INITIAL   4.  Pt will decr coughing and throat clearing to 1 instance, in three sessions Baseline:  Goal status: INITIAL     ASSESSMENT:   CLINICAL IMPRESSION: Patient is a 78 y.o. F who was seen today for treatment of voice function in light of chronic cough dx, and reported sx not unlike those of pt with VCD. See "treatment date" above for today's date for further details on today's session. Yvette Short stated that she is hindered in communication both by fear of coughing while in conversation as she tells SLP that "I'm afraid it might happen when I'm talking with someone." And is hindered in communication in that talking generates the coughing. She would cont to benefit from skilled ST focusing on AB and habituating use of compensations for minimizing or eliminating sx of chronic cough and those not unlike sx of VCD.    OBJECTIVE IMPAIRMENTS: include voice disorder. These impairments are limiting patient from household responsibilities, ADLs/IADLs, and effectively communicating at home and in community. Factors affecting potential to achieve goals and functional outcome are  none noted .Yvette Short Patient will benefit from skilled SLP services to address above impairments and improve overall function.   REHAB POTENTIAL: Excellent   PLAN:   SLP FREQUENCY: 1-2x/week   SLP DURATION: 8 weeks   PLANNED INTERVENTIONS: Internal/external aids, Functional tasks, SLP instruction and feedback, Compensatory strategies, Patient/family education, and [id number] Treatment of  speech (30 or 45 min)    Yvette Short, CCC-SLP 07/27/2023, 2:54 PM   REFERRING DIAG: 05.3 (ICD-10-CM) - Chronic cough I63.89 (ICD-10-CM) - Cerebrovascular accident (CVA) due to other mechanism (HCC) R49.9 (ICD-10-CM) - Change in voice  THERAPY DIAG:  Other voice and resonance disorders  What was this (referring dx) caused by? []  Surgery []  Fall []  Ongoing issue []  Arthritis []  Other: ____________  Laterality: []  Rt []  Lt []  Both  Check all possible CPT codes:  *CHOOSE 10 OR LESS*    See Planned Interventions listed in the Plan section of the Evaluation.

## 2023-08-02 ENCOUNTER — Ambulatory Visit

## 2023-08-03 DIAGNOSIS — D225 Melanocytic nevi of trunk: Secondary | ICD-10-CM | POA: Diagnosis not present

## 2023-08-03 DIAGNOSIS — L821 Other seborrheic keratosis: Secondary | ICD-10-CM | POA: Diagnosis not present

## 2023-08-03 DIAGNOSIS — D2271 Melanocytic nevi of right lower limb, including hip: Secondary | ICD-10-CM | POA: Diagnosis not present

## 2023-08-03 DIAGNOSIS — L309 Dermatitis, unspecified: Secondary | ICD-10-CM | POA: Diagnosis not present

## 2023-08-03 DIAGNOSIS — D692 Other nonthrombocytopenic purpura: Secondary | ICD-10-CM | POA: Diagnosis not present

## 2023-08-03 DIAGNOSIS — D2272 Melanocytic nevi of left lower limb, including hip: Secondary | ICD-10-CM | POA: Diagnosis not present

## 2023-08-04 ENCOUNTER — Ambulatory Visit: Attending: Family Medicine

## 2023-08-04 DIAGNOSIS — R498 Other voice and resonance disorders: Secondary | ICD-10-CM | POA: Diagnosis not present

## 2023-08-04 DIAGNOSIS — F801 Expressive language disorder: Secondary | ICD-10-CM | POA: Insufficient documentation

## 2023-08-04 NOTE — Therapy (Signed)
 OUTPATIENT SPEECH LANGUAGE PATHOLOGY VOICE TREATMENT   Patient Name: Yvette Short MRN: [id number] DOB:[birth year], 78 y.o., female Today's Date: 08/04/2023  PCP: Napolean Backbone, DO REFERRING PROVIDER: Same as PCP  END OF SESSION:  End of Session - 08/04/23 0942     Visit Number 4    Number of Visits 13    Date for SLP Re-Evaluation 09/09/23   first ST on 08/02/23   SLP Start Time 0936    SLP Stop Time  1015    SLP Time Calculation (min) 39 min    Activity Tolerance Patient tolerated treatment well               Past Medical History:  Diagnosis Date   Abnormal colonoscopy 03/2017   decreased rectal tone and polyp; 5 year recall   Abnormal findings on esophagogastroduodenoscopy (EGD) 02/25/2016   Z-line irrg. at 40 cm. No abnomrality of the esophagus to explain dysphagia. Esophagus dilated. H/o + H. Pylori   Allergy     ANXIETY 07/06/2007   Aortic atherosclerosis (HCC) 02/08/2018   Aortic valve regurgitation    Atherosclerosis of native coronary artery of native heart without angina pectoris 02/08/2018   Biceps tendonitis on right    Bronchiectasis without complication (HCC) 03/16/2011   CT 2012 IMPRESSION:  1. Apical scarring may account for the plain film abnormality.  2. A 6 mm right upper lobe pulmonary nodule.  - - HRCT 07/12/2017 >>>  Scattered minimal cylindrical and varicoid bronchiectasis with associated scattered mucoid impaction and minimal tree-in-bud opacity in both lungs, predominantly in the mid to lower lungs. Findings are stable to slightly worsened since 2015 ches   Chicken pox    Chronic cough 05/18/2017   CT chest 03/19/14  No bronchiectasis (not hrct)  Spirometry 05/18/2017  FEV1  2.15 (93%)  Ratio 76 s rx prior  - FENO 05/18/2017  =   Could not perform   - Allergy  profile 05/18/2017 >  Eos 0.2 /  IgE  75 RAST pos cat > dog > mold - HRCT 07/12/2017 >>>  See bronchiectasis - trial of dymista  07/26/2017 > some better but not consistent with it > rechallenge rx  one puff each am 10/25/2017  - MCT 11/02/17 >     Colon polyp 03/31/2017   hyperplastic   COVID-19 08/04/2020   mild   Diverticulosis 03/2017   sigmoid   Family history of colon cancer 04/03/2017   Formatting of this note might be different from the original. 1/19 colon - no adenomas Recommend screening exam in 1/24 - [facility]   Family history of ovarian cancer    Genetic testing 03/26/2016   Negative genetic testing on the [facility] 30 gene panel.  The 30-Gene Cancer Panel offered by [facility] includes sequencing and/or deletion duplication testing of the following 30 genes: APC, ATM, BAP1, BARD1, BMPR1A, BRCA1, BRCA2, BRIP1, CDH1, CDK4, CDKN2A (p14ARF), CDKN2A (p16INK4a), CHEK2, EPCAM, GREM1, MITF, MLH1, MSH2, MSH6, MUTYH, NBN, PALB2, PMS2, POLD1, POLE, PTEN, RAD51C, RAD51D, SMAD4,    GERD 11/09/2006   H. pylori infection    h/o treated wiht prevpac   Hormone replacement therapy 01/08/2019   HYPERLIPIDEMIA 11/09/2006   Hypertension    Hypothyroidism 08/03/2017   LOC (loss of consciousness) (HCC) 01/26/2022   Lumbar radiculopathy 07/24/2014   Migraine    Mild depression 02/14/2019   Pharyngoesophageal dysphagia 08/03/2017   Piriformis syndrome of right side    Spinal stenosis, lumbar region, with neurogenic claudication 10/16/2014   Epidural 10/02/2014, December 2016  Stomach ulcer 2008   Past Surgical History:  Procedure Laterality Date   CATARACT EXTRACTION Bilateral 04/2021   CERVICAL POLYPECTOMY  01/17/2019   LIPOMA EXCISION     of back   TUBAL LIGATION     Patient Active Problem List   Diagnosis Date Noted   Adjustment reaction with anxiety and depression 01/14/2023   ELIQUIS -Acquired thrombophilia (HCC) 12/23/2022   Cerebrovascular accident (HCC) 07/06/2022   Atrial fibrillation (HCC) 07/06/2022   Obstructive sleep apnea on CPAP 01/27/2022   Bradycardia with 51-60 beats per minute 01/26/2022   Sleep disorder 12/17/2021   Rotator cuff tendinitis, right 10/09/2021    Memory deficit MMSE 30/30 06/30/2021   H. pylori infection    Moderate aortic regurgitation 10/17/2019   Non-sustained ventricular tachycardia (HCC) 09/04/2019   Rheumatic aortic valve insufficiency 09/04/2019   Hormone replacement therapy 01/08/2019   Aortic atherosclerosis (HCC) 02/08/2018   Atherosclerosis of native coronary artery of native heart without angina pectoris 02/08/2018   Hypothyroidism 08/03/2017   Pharyngoesophageal dysphagia 08/03/2017   Chronic cough 05/18/2017   Spinal stenosis, lumbar region, with neurogenic claudication 10/16/2014   Lumbar radiculopathy 07/24/2014   Bronchiectasis without complication (HCC) 03/16/2011   Essential hypertension 12/21/2010   Hyperlipidemia 11/09/2006   GERD 11/09/2006   Stomach ulcer 2008    Onset date: "probably 6-7 years ago"; script dated 07/14/23  REFERRING DIAG: 05.3 (ICD-10-CM) - Chronic cough I63.89 (ICD-10-CM) - Cerebrovascular accident (CVA) due to other mechanism (HCC) R49.9 (ICD-10-CM) - Change in voice  THERAPY DIAG:  Other voice and resonance disorders  Expressive language impairment  Rationale for Evaluation and Treatment: Rehabilitation  SUBJECTIVE:   SUBJECTIVE STATEMENT: "That helps a lot." (Pt, re: swallowing, and the visualization for her relaxed place to quell VCD and chronic cough sx)  Pt accompanied by: self  PERTINENT HISTORY: Pt with CVA in 2024 and had short course of ST treating expressive communication when pt mentioned cough as well. SLP-Johnson addressed this in last few sessions with pt.  PAIN:  Are you having pain? No  FALLS: Has patient fallen in last 6 months? No,   PATIENT GOALS: "Get rid of this cough."  OBJECTIVE:  Note: Objective measures were completed at Evaluation unless otherwise noted.  DIAGNOSTIC FINDINGS:  PCP - 07/14/23 Mild depression and worry: Patient reports she feels the Lexapro  10 mg has been helpful.  She is taking the trazodone  to help with sleep  nightly. Prior note: pt reports she feels she is worrying more about her health and future. She endorses feeling down in the dumps. She is not sleeping well. Trazodone  helps some. She feels her bad days are now outweighing her good days.  She had been on effexor  a few years ago, mostly for MSK complaints, but she noticed a positive effect in her emotions on medication. She reports she had a hard time when she tried to come off that medication.   Chronic cough/voice change: Working well Gabapentin  300 mg twice daily   HTN/a.fib/Mixed hyperlipidemia/aortic atherosclerosis/AR/MR/CAD-h/o stroke Pt reports compliance with losartan12.5 mg qd, amlodipine  5 mg qd, eliquis , lipitor and sectral   Patient denies chest pain, shortness of breath, dizziness or lower extremity edema.  Speech therapy referral placed   Established with cardio. RF: [email or web address] CAD.stroke 08/30/2020-echo: Moderate mitral regurgitation, mild to moderate aortic regurgitation, mild to moderate tricuspid regurgitation. The right-sided pressures - mildly elevated.   Hypothyroidism, unspecified type Compliant with levo 88 mcg qd.   PATIENT REPORTED OUTCOME MEASURES (PROM): VCD-Q provided and pt scored 40/60  with higher scores correlating with sx of VCD causing decr'd QOL.                                                                                                                            TREATMENT DATE:  Abdominal breathing=AB, Vocal Cord Dysfunction=VCD  08/04/23: SLP reviewed all strategies (physiological and psychological) for pt to use to quell/eliminate s/sx VCD and chronic cough. Pt stated she has been feeling AB throughout the week but not necessarily during conversation - is questioning whether she uses AB during conversation or not. SLP targeted AB in conversation with pt today. First 6 minutes with SLP asking pt short answer related "wh" questions, pt with AB noted by SLP 90%. Afterwards, SLP engaged pt in conversation  for 10 minutes about salient and interesting topics for pt - her success with AB was 85% today. Consider decr'ing pt's frequency to once/week.  07/27/23: Pt told SLP 3 wyas to compensate for s/sx VCD. Pt filled out VCD-Q today with above results. She used AB for 3 minutes with excellent success. She req'd min A rarely for using AB with simple "wh" questions and yes/no with AB 90% success. SLP told pt to set alarm on her watch for a time she knows she will talk, and try to talk with AB for 10 minutes after the alarm.  07/26/23: NEEDS PROM NEXT SESSION. Discussed with pt her coughing "spell" occurring at [employer] almost every session. SLP asked and learned pt is horizontal and ?s role of GERD on pt's cough. Suggested she try alginate therapy and provided web addresses for [facility] and [facility]. SLP noted pt clearing throat 6 times and cough 2 times in first 15 minutes. SLP introduced some alternatives to throat clearing. SLP and pt reviewed these and SLP provided examples and pt then imitated SLP correctly.  Lastly, SLP taught pt how to produce AB. She was 90% successful at rest over 2 minutes. SLP told pt to practice AB 15 minutes BID.  07/20/23: SLP and pt discussed compensations for s/sx of VCD and chronic cough including visualization, and AB. SLP discussed s/sx of VCD and how AB decreases frequency of s/sx VCD and chronic cough. Pt generated her scene of relaxation which included a childhood "green space" with a small pond and a sitting area.   PATIENT EDUCATION: Education details: see "treatment date" Person educated: Patient Education method: Explanation, Demonstration, Verbal cues, and Handouts Education comprehension: verbalized understanding, returned demonstration, verbal cues required, and needs further education  HOME EXERCISE PROGRAM: VCD techniques, and AB(eventually)  GOALS: Goals reviewed with patient? Yes  SHORT TERM GOALS: Target date: 08/19/23   Pt will complete  PROM Baseline: Goal status: met   2.  Pt will demo 2 compensations she can use for s/sx VCD in 2 sessions Baseline: 07/27/23 Goal status: met   3.  Pt will report using one compensation (visualization, sniff sniff blow, pursed lip breathing, etc) successfully to decr severity of  VCD between 2 sessions Baseline: 08/04/23 Goal status: INITIAL   4.  Pt will demo AB in sentence responses 80% of the time in two sessions Baseline: 08/04/23 Goal status: INITIAL     LONG TERM GOALS: Target date: 09/16/23   Pt will improve PROM compared to initial administration Baseline:  Goal status: INITIAL   2.  Pt will report using one compensation (visualization, sniff sniff blow, pursed lip breathing, etc) successfully to decr severity of VCD between 3 sessions, after 07/29/23 Baseline:  Goal status: INITIAL   3.  Pt will demo AB 80% of the time in 10 minutes conversation in three sessions Baseline:  Goal status: INITIAL   4.  Pt will decr coughing and throat clearing to 1 instance, in three sessions Baseline:  Goal status: INITIAL     ASSESSMENT:   CLINICAL IMPRESSION: Patient is a 78 y.o. F who was seen today for treatment of voice function in light of chronic cough dx, and reported sx not unlike those of pt with VCD. See "treatment date" above for today's date for further details on today's session. AB is being used at rest most of the time and today pt did very well in conversation for 10 minutes, using AB. Kollins stated that she is hindered in communication both by fear of coughing while in conversation as she tells SLP that "I'm afraid it might happen when I'm talking with someone." And is hindered in communication in that talking generates the coughing. She would cont to benefit from skilled ST focusing on AB and habituating use of compensations for minimizing or eliminating sx of chronic cough and those not unlike sx of VCD.    OBJECTIVE IMPAIRMENTS: include voice disorder. These impairments  are limiting patient from household responsibilities, ADLs/IADLs, and effectively communicating at home and in community. Factors affecting potential to achieve goals and functional outcome are  none noted .Aaron Aas Patient will benefit from skilled SLP services to address above impairments and improve overall function.   REHAB POTENTIAL: Excellent   PLAN:   SLP FREQUENCY: 1-2x/week   SLP DURATION: 8 weeks   PLANNED INTERVENTIONS: Internal/external aids, Functional tasks, SLP instruction and feedback, Compensatory strategies, Patient/family education, and [id number] Treatment of speech (30 or 45 min)    Salah Burlison, CCC-SLP 08/04/2023, 9:42 AM   REFERRING DIAG: 05.3 (ICD-10-CM) - Chronic cough I63.89 (ICD-10-CM) - Cerebrovascular accident (CVA) due to other mechanism (HCC) R49.9 (ICD-10-CM) - Change in voice  THERAPY DIAG:  Other voice and resonance disorders  What was this (referring dx) caused by? []  Surgery []  Fall []  Ongoing issue []  Arthritis []  Other: ____________  Laterality: []  Rt []  Lt []  Both  Check all possible CPT codes:  *CHOOSE 10 OR LESS*    See Planned Interventions listed in the Plan section of the Evaluation.

## 2023-08-08 ENCOUNTER — Ambulatory Visit: Admitting: Speech Pathology

## 2023-08-08 DIAGNOSIS — R498 Other voice and resonance disorders: Secondary | ICD-10-CM | POA: Diagnosis not present

## 2023-08-08 DIAGNOSIS — F801 Expressive language disorder: Secondary | ICD-10-CM | POA: Diagnosis not present

## 2023-08-08 NOTE — Therapy (Signed)
 OUTPATIENT SPEECH LANGUAGE PATHOLOGY VOICE TREATMENT   Patient Name: Yvette Short MRN: [id number] DOB:02/03/1946, 78 y.o., female Today's Date: 08/08/2023  PCP: Napolean Backbone, DO REFERRING PROVIDER: Same as PCP  END OF SESSION:  End of Session - 08/08/23 1011     Visit Number 5    Number of Visits 13    Date for SLP Re-Evaluation 09/09/23   first ST on 08/02/23   SLP Start Time 1015    SLP Stop Time  1100    SLP Time Calculation (min) 45 min    Activity Tolerance Patient tolerated treatment well               Past Medical History:  Diagnosis Date   Abnormal colonoscopy 03/2017   decreased rectal tone and polyp; 5 year recall   Abnormal findings on esophagogastroduodenoscopy (EGD) 02/25/2016   Z-line irrg. at 40 cm. No abnomrality of the esophagus to explain dysphagia. Esophagus dilated. H/o + H. Pylori   Allergy     ANXIETY 07/06/2007   Aortic atherosclerosis (HCC) 02/08/2018   Aortic valve regurgitation    Atherosclerosis of native coronary artery of native heart without angina pectoris 02/08/2018   Biceps tendonitis on right    Bronchiectasis without complication (HCC) 03/16/2011   CT 2012 IMPRESSION:  1. Apical scarring may account for the plain film abnormality.  2. A 6 mm right upper lobe pulmonary nodule.  - - HRCT 07/12/2017 >>>  Scattered minimal cylindrical and varicoid bronchiectasis with associated scattered mucoid impaction and minimal tree-in-bud opacity in both lungs, predominantly in the mid to lower lungs. Findings are stable to slightly worsened since 2015 ches   Chicken pox    Chronic cough 05/18/2017   CT chest 03/19/14  No bronchiectasis (not hrct)  Spirometry 05/18/2017  FEV1  2.15 (93%)  Ratio 76 s rx prior  - FENO 05/18/2017  =   Could not perform   - Allergy  profile 05/18/2017 >  Eos 0.2 /  IgE  75 RAST pos cat > dog > mold - HRCT 07/12/2017 >>>  See bronchiectasis - trial of dymista  07/26/2017 > some better but not consistent with it > rechallenge  rx one puff each am 10/25/2017  - MCT 11/02/17 >     Colon polyp 03/31/2017   hyperplastic   COVID-19 08/04/2020   mild   Diverticulosis 03/2017   sigmoid   Family history of colon cancer 04/03/2017   Formatting of this note might be different from the original. 1/19 colon - no adenomas Recommend screening exam in 1/24 - [facility]   Family history of ovarian cancer    Genetic testing 03/26/2016   Negative genetic testing on the [facility] 30 gene panel.  The 30-Gene Cancer Panel offered by [facility] includes sequencing and/or deletion duplication testing of the following 30 genes: APC, ATM, BAP1, BARD1, BMPR1A, BRCA1, BRCA2, BRIP1, CDH1, CDK4, CDKN2A (p14ARF), CDKN2A (p16INK4a), CHEK2, EPCAM, GREM1, MITF, MLH1, MSH2, MSH6, MUTYH, NBN, PALB2, PMS2, POLD1, POLE, PTEN, RAD51C, RAD51D, SMAD4,    GERD 11/09/2006   H. pylori infection    h/o treated wiht prevpac   Hormone replacement therapy 01/08/2019   HYPERLIPIDEMIA 11/09/2006   Hypertension    Hypothyroidism 08/03/2017   LOC (loss of consciousness) (HCC) 01/26/2022   Lumbar radiculopathy 07/24/2014   Migraine    Mild depression 02/14/2019   Pharyngoesophageal dysphagia 08/03/2017   Piriformis syndrome of right side    Spinal stenosis, lumbar region, with neurogenic claudication 10/16/2014   Epidural 10/02/2014, December 2016  Stomach ulcer 2008   Past Surgical History:  Procedure Laterality Date   CATARACT EXTRACTION Bilateral 04/2021   CERVICAL POLYPECTOMY  01/17/2019   LIPOMA EXCISION     of back   TUBAL LIGATION     Patient Active Problem List   Diagnosis Date Noted   Adjustment reaction with anxiety and depression 01/14/2023   ELIQUIS -Acquired thrombophilia (HCC) 12/23/2022   Cerebrovascular accident (HCC) 07/06/2022   Atrial fibrillation (HCC) 07/06/2022   Obstructive sleep apnea on CPAP 01/27/2022   Bradycardia with 51-60 beats per minute 01/26/2022   Sleep disorder 12/17/2021   Rotator cuff tendinitis, right  10/09/2021   Memory deficit MMSE 30/30 06/30/2021   H. pylori infection    Moderate aortic regurgitation 10/17/2019   Non-sustained ventricular tachycardia (HCC) 09/04/2019   Rheumatic aortic valve insufficiency 09/04/2019   Hormone replacement therapy 01/08/2019   Aortic atherosclerosis (HCC) 02/08/2018   Atherosclerosis of native coronary artery of native heart without angina pectoris 02/08/2018   Hypothyroidism 08/03/2017   Pharyngoesophageal dysphagia 08/03/2017   Chronic cough 05/18/2017   Spinal stenosis, lumbar region, with neurogenic claudication 10/16/2014   Lumbar radiculopathy 07/24/2014   Bronchiectasis without complication (HCC) 03/16/2011   Essential hypertension 12/21/2010   Hyperlipidemia 11/09/2006   GERD 11/09/2006   Stomach ulcer 2008    Onset date: "probably 6-7 years ago"; script dated 07/14/23  REFERRING DIAG: 05.3 (ICD-10-CM) - Chronic cough I63.89 (ICD-10-CM) - Cerebrovascular accident (CVA) due to other mechanism (HCC) R49.9 (ICD-10-CM) - Change in voice  THERAPY DIAG:  Other voice and resonance disorders  Rationale for Evaluation and Treatment: Rehabilitation  SUBJECTIVE:   SUBJECTIVE STATEMENT: Pt reports "varying degrees of success"   Pt accompanied by: self  PERTINENT HISTORY: Pt with CVA in 2024 and had short course of ST treating expressive communication when pt mentioned cough as well. SLP-Johnson addressed this in last few sessions with pt.  PAIN:  Are you having pain? No  FALLS: Has patient fallen in last 6 months? No,   PATIENT GOALS: "Get rid of this cough."  OBJECTIVE:  Note: Objective measures were completed at Evaluation unless otherwise noted.  DIAGNOSTIC FINDINGS:  PCP - 07/14/23 Mild depression and worry: Patient reports she feels the Lexapro  10 mg has been helpful.  She is taking the trazodone  to help with sleep nightly. Prior note: pt reports she feels she is worrying more about her health and future. She endorses feeling  down in the dumps. She is not sleeping well. Trazodone  helps some. She feels her bad days are now outweighing her good days.  She had been on effexor  a few years ago, mostly for MSK complaints, but she noticed a positive effect in her emotions on medication. She reports she had a hard time when she tried to come off that medication.   Chronic cough/voice change: Working well Gabapentin  300 mg twice daily   HTN/a.fib/Mixed hyperlipidemia/aortic atherosclerosis/AR/MR/CAD-h/o stroke Pt reports compliance with losartan12.5 mg qd, amlodipine  5 mg qd, eliquis , lipitor and sectral   Patient denies chest pain, shortness of breath, dizziness or lower extremity edema.  Speech therapy referral placed   Established with cardio. RF: [email or web address] CAD.stroke 08/30/2020-echo: Moderate mitral regurgitation, mild to moderate aortic regurgitation, mild to moderate tricuspid regurgitation. The right-sided pressures - mildly elevated.   Hypothyroidism, unspecified type Compliant with levo 88 mcg qd.   PATIENT REPORTED OUTCOME MEASURES (PROM): VCD-Q provided and pt scored 40/60 with higher scores correlating with sx of VCD causing decr'd QOL.  TREATMENT DATE:  Abdominal breathing=AB, Vocal Cord Dysfunction=VCD  08/08/23: Addressed employments of VCD management strategies while in prone position d/t pt rpeorting coughing episodes frequently occursing while lying down (pilates and laying for nap). SLP reviewed strategies with occasional demonstration provided. Pt ID x3 preferred strateiges: visualization, sniff sniff "sh," and soft cough. Pt demonstrates use of strategies with mod-I. SLP moved pt to room with mat, pt remained in prone position during conversation for 15 minutes with no coughing evidenced. Upon returning to sitting, pt with cough response with stated " I couldn't stop it." SLP cues  for sniff sniff blow to reduced duration and intensity with pt successfully demonstrating. Discussed setting 2 minute timer to practice rescue breathing techniques to A in ability to carryover when VCD attack occurs. Discussed challenges in "re-wiring" to reduced VCD instances and intensity with pt verbalizing appreciation. Encouraged pt to keep practicing despite not being 100%.   08/04/23: SLP reviewed all strategies (physiological and psychological) for pt to use to quell/eliminate s/sx VCD and chronic cough. Pt stated she has been feeling AB throughout the week but not necessarily during conversation - is questioning whether she uses AB during conversation or not. SLP targeted AB in conversation with pt today. First 6 minutes with SLP asking pt short answer related "wh" questions, pt with AB noted by SLP 90%. Afterwards, SLP engaged pt in conversation for 10 minutes about salient and interesting topics for pt - her success with AB was 85% today. Consider decr'ing pt's frequency to once/week.  07/27/23: Pt told SLP 3 wyas to compensate for s/sx VCD. Pt filled out VCD-Q today with above results. She used AB for 3 minutes with excellent success. She req'd min A rarely for using AB with simple "wh" questions and yes/no with AB 90% success. SLP told pt to set alarm on her watch for a time she knows she will talk, and try to talk with AB for 10 minutes after the alarm.  07/26/23: NEEDS PROM NEXT SESSION. Discussed with pt her coughing "spell" occurring at Pilates almost every session. SLP asked and learned pt is horizontal and ?s role of GERD on pt's cough. Suggested she try alginate therapy and provided web addresses for [facility] and [facility]. SLP noted pt clearing throat 6 times and cough 2 times in first 15 minutes. SLP introduced some alternatives to throat clearing. SLP and pt reviewed these and SLP provided examples and pt then imitated SLP correctly.  Lastly, SLP taught pt how to produce AB. She  was 90% successful at rest over 2 minutes. SLP told pt to practice AB 15 minutes BID.  07/20/23: SLP and pt discussed compensations for s/sx of VCD and chronic cough including visualization, and AB. SLP discussed s/sx of VCD and how AB decreases frequency of s/sx VCD and chronic cough. Pt generated her scene of relaxation which included a childhood "green space" with a small pond and a sitting area.   PATIENT EDUCATION: Education details: see "treatment date" Person educated: Patient Education method: Explanation, Demonstration, Verbal cues, and Handouts Education comprehension: verbalized understanding, returned demonstration, verbal cues required, and needs further education  HOME EXERCISE PROGRAM: VCD techniques, and AB(eventually)  GOALS: Goals reviewed with patient? Yes  SHORT TERM GOALS: Target date: 08/19/23   Pt will complete PROM Baseline: Goal status: met   2.  Pt will demo 2 compensations she can use for s/sx VCD in 2 sessions Baseline: 07/27/23 Goal status: met   3.  Pt will report using one  compensation (visualization, sniff sniff blow, pursed lip breathing, etc) successfully to decr severity of VCD between 2 sessions Baseline: 08/04/23 Goal status: INITIAL   4.  Pt will demo AB in sentence responses 80% of the time in two sessions Baseline: 08/04/23 Goal status: INITIAL     LONG TERM GOALS: Target date: 09/16/23   Pt will improve PROM compared to initial administration Baseline:  Goal status: INITIAL   2.  Pt will report using one compensation (visualization, sniff sniff blow, pursed lip breathing, etc) successfully to decr severity of VCD between 3 sessions, after 07/29/23 Baseline:  Goal status: INITIAL   3.  Pt will demo AB 80% of the time in 10 minutes conversation in three sessions Baseline:  Goal status: INITIAL   4.  Pt will decr coughing and throat clearing to 1 instance, in three sessions Baseline:  Goal status: INITIAL     ASSESSMENT:   CLINICAL  IMPRESSION: Patient is a 78 y.o. F who was seen today for treatment of voice function in light of chronic cough dx, and reported sx not unlike those of pt with VCD. See "treatment date" above for today's date for further details on today's session. AB is being used at rest most of the time and today pt did very well in conversation for 10 minutes, using AB. Syla stated that she is hindered in communication both by fear of coughing while in conversation as she tells SLP that "I'm afraid it might happen when I'm talking with someone." And is hindered in communication in that talking generates the coughing. She would cont to benefit from skilled ST focusing on AB and habituating use of compensations for minimizing or eliminating sx of chronic cough and those not unlike sx of VCD.    OBJECTIVE IMPAIRMENTS: include voice disorder. These impairments are limiting patient from household responsibilities, ADLs/IADLs, and effectively communicating at home and in community. Factors affecting potential to achieve goals and functional outcome are  none noted .Aaron Aas Patient will benefit from skilled SLP services to address above impairments and improve overall function.   REHAB POTENTIAL: Excellent   PLAN:   SLP FREQUENCY: 1-2x/week   SLP DURATION: 8 weeks   PLANNED INTERVENTIONS: Internal/external aids, Functional tasks, SLP instruction and feedback, Compensatory strategies, Patient/family education, and [id number] Treatment of speech (30 or 45 min)    Alston Jerry, CCC-SLP 08/08/2023, 10:13 AM   REFERRING DIAG: 05.3 (ICD-10-CM) - Chronic cough I63.89 (ICD-10-CM) - Cerebrovascular accident (CVA) due to other mechanism (HCC) R49.9 (ICD-10-CM) - Change in voice  THERAPY DIAG:  Other voice and resonance disorders  What was this (referring dx) caused by? []  Surgery []  Fall []  Ongoing issue []  Arthritis []  Other: ____________  Laterality: []  Rt []  Lt []  Both  Check all possible CPT codes:  *CHOOSE  10 OR LESS*    See Planned Interventions listed in the Plan section of the Evaluation.

## 2023-08-10 ENCOUNTER — Ambulatory Visit

## 2023-08-10 DIAGNOSIS — R498 Other voice and resonance disorders: Secondary | ICD-10-CM | POA: Diagnosis not present

## 2023-08-10 DIAGNOSIS — F801 Expressive language disorder: Secondary | ICD-10-CM | POA: Diagnosis not present

## 2023-08-10 NOTE — Therapy (Signed)
 OUTPATIENT SPEECH LANGUAGE PATHOLOGY VOICE TREATMENT   Patient Name: Yvette Short MRN: [id number] DOB:1945-09-09, 78 y.o., female Today's Date: 08/10/2023  PCP: Napolean Backbone, DO REFERRING PROVIDER: Same as PCP  END OF SESSION:  End of Session - 08/10/23 1358     Visit Number 6    Number of Visits 13    Date for SLP Re-Evaluation 09/09/23   first ST on 08/02/23   SLP Start Time 1318    SLP Stop Time  1354    SLP Time Calculation (min) 36 min    Activity Tolerance Patient tolerated treatment well                Past Medical History:  Diagnosis Date   Abnormal colonoscopy 03/2017   decreased rectal tone and polyp; 5 year recall   Abnormal findings on esophagogastroduodenoscopy (EGD) 02/25/2016   Z-line irrg. at 40 cm. No abnomrality of the esophagus to explain dysphagia. Esophagus dilated. H/o + H. Pylori   Allergy     ANXIETY 07/06/2007   Aortic atherosclerosis (HCC) 02/08/2018   Aortic valve regurgitation    Atherosclerosis of native coronary artery of native heart without angina pectoris 02/08/2018   Biceps tendonitis on right    Bronchiectasis without complication (HCC) 03/16/2011   CT 2012 IMPRESSION:  1. Apical scarring may account for the plain film abnormality.  2. A 6 mm right upper lobe pulmonary nodule.  - - HRCT 07/12/2017 >>>  Scattered minimal cylindrical and varicoid bronchiectasis with associated scattered mucoid impaction and minimal tree-in-bud opacity in both lungs, predominantly in the mid to lower lungs. Findings are stable to slightly worsened since 2015 ches   Chicken pox    Chronic cough 05/18/2017   CT chest 03/19/14  No bronchiectasis (not hrct)  Spirometry 05/18/2017  FEV1  2.15 (93%)  Ratio 76 s rx prior  - FENO 05/18/2017  =   Could not perform   - Allergy  profile 05/18/2017 >  Eos 0.2 /  IgE  75 RAST pos cat > dog > mold - HRCT 07/12/2017 >>>  See bronchiectasis - trial of dymista  07/26/2017 > some better but not consistent with it > rechallenge  rx one puff each am 10/25/2017  - MCT 11/02/17 >     Colon polyp 03/31/2017   hyperplastic   COVID-19 08/04/2020   mild   Diverticulosis 03/2017   sigmoid   Family history of colon cancer 04/03/2017   Formatting of this note might be different from the original. 1/19 colon - no adenomas Recommend screening exam in 1/24 - [facility]   Family history of ovarian cancer    Genetic testing 03/26/2016   Negative genetic testing on the [facility] 30 gene panel.  The 30-Gene Cancer Panel offered by [facility] includes sequencing and/or deletion duplication testing of the following 30 genes: APC, ATM, BAP1, BARD1, BMPR1A, BRCA1, BRCA2, BRIP1, CDH1, CDK4, CDKN2A (p14ARF), CDKN2A (p16INK4a), CHEK2, EPCAM, GREM1, MITF, MLH1, MSH2, MSH6, MUTYH, NBN, PALB2, PMS2, POLD1, POLE, PTEN, RAD51C, RAD51D, SMAD4,    GERD 11/09/2006   H. pylori infection    h/o treated wiht prevpac   Hormone replacement therapy 01/08/2019   HYPERLIPIDEMIA 11/09/2006   Hypertension    Hypothyroidism 08/03/2017   LOC (loss of consciousness) (HCC) 01/26/2022   Lumbar radiculopathy 07/24/2014   Migraine    Mild depression 02/14/2019   Pharyngoesophageal dysphagia 08/03/2017   Piriformis syndrome of right side    Spinal stenosis, lumbar region, with neurogenic claudication 10/16/2014   Epidural 10/02/2014, December  2016    Stomach ulcer 2008   Past Surgical History:  Procedure Laterality Date   CATARACT EXTRACTION Bilateral 04/2021   CERVICAL POLYPECTOMY  01/17/2019   LIPOMA EXCISION     of back   TUBAL LIGATION     Patient Active Problem List   Diagnosis Date Noted   Adjustment reaction with anxiety and depression 01/14/2023   ELIQUIS -Acquired thrombophilia (HCC) 12/23/2022   Cerebrovascular accident (HCC) 07/06/2022   Atrial fibrillation (HCC) 07/06/2022   Obstructive sleep apnea on CPAP 01/27/2022   Bradycardia with 51-60 beats per minute 01/26/2022   Sleep disorder 12/17/2021   Rotator cuff tendinitis, right  10/09/2021   Memory deficit MMSE 30/30 06/30/2021   H. pylori infection    Moderate aortic regurgitation 10/17/2019   Non-sustained ventricular tachycardia (HCC) 09/04/2019   Rheumatic aortic valve insufficiency 09/04/2019   Hormone replacement therapy 01/08/2019   Aortic atherosclerosis (HCC) 02/08/2018   Atherosclerosis of native coronary artery of native heart without angina pectoris 02/08/2018   Hypothyroidism 08/03/2017   Pharyngoesophageal dysphagia 08/03/2017   Chronic cough 05/18/2017   Spinal stenosis, lumbar region, with neurogenic claudication 10/16/2014   Lumbar radiculopathy 07/24/2014   Bronchiectasis without complication (HCC) 03/16/2011   Essential hypertension 12/21/2010   Hyperlipidemia 11/09/2006   GERD 11/09/2006   Stomach ulcer 2008    Onset date: "probably 6-7 years ago"; script dated 07/14/23  REFERRING DIAG: 05.3 (ICD-10-CM) - Chronic cough I63.89 (ICD-10-CM) - Cerebrovascular accident (CVA) due to other mechanism (HCC) R49.9 (ICD-10-CM) - Change in voice  THERAPY DIAG:  Other voice and resonance disorders  Rationale for Evaluation and Treatment: Rehabilitation  SUBJECTIVE:   SUBJECTIVE STATEMENT: Pt reports "varying degrees of success"   Pt accompanied by: self  PERTINENT HISTORY: Pt with CVA in 2024 and had short course of ST treating expressive communication when pt mentioned cough as well. SLP-Johnson addressed this in last few sessions with pt.  PAIN:  Are you having pain? No  FALLS: Has patient fallen in last 6 months? No,   PATIENT GOALS: "Get rid of this cough."  OBJECTIVE:  Note: Objective measures were completed at Evaluation unless otherwise noted.  DIAGNOSTIC FINDINGS:  PCP - 07/14/23 Mild depression and worry: Patient reports she feels the Lexapro  10 mg has been helpful.  She is taking the trazodone  to help with sleep nightly. Prior note: pt reports she feels she is worrying more about her health and future. She endorses feeling  down in the dumps. She is not sleeping well. Trazodone  helps some. She feels her bad days are now outweighing her good days.  She had been on effexor  a few years ago, mostly for MSK complaints, but she noticed a positive effect in her emotions on medication. She reports she had a hard time when she tried to come off that medication.   Chronic cough/voice change: Working well Gabapentin  300 mg twice daily   HTN/a.fib/Mixed hyperlipidemia/aortic atherosclerosis/AR/MR/CAD-h/o stroke Pt reports compliance with losartan12.5 mg qd, amlodipine  5 mg qd, eliquis , lipitor and sectral   Patient denies chest pain, shortness of breath, dizziness or lower extremity edema.  Speech therapy referral placed   Established with cardio. RF: [email or web address] CAD.stroke 08/30/2020-echo: Moderate mitral regurgitation, mild to moderate aortic regurgitation, mild to moderate tricuspid regurgitation. The right-sided pressures - mildly elevated.   Hypothyroidism, unspecified type Compliant with levo 88 mcg qd.   PATIENT REPORTED OUTCOME MEASURES (PROM): VCD-Q provided and pt scored 40/60 with higher scores correlating with sx of VCD causing decr'd QOL.  TREATMENT DATE:  Abdominal breathing=AB, Vocal Cord Dysfunction=VCD  08/10/23: Pt indicates success this morning with coughing episode for about 6 minutes which pt states she reduced the severity and the length of the episode by using techniques for quelling s/sx chronic cough and VCD. States both s/sx VCD and cough have been reduced to less than half compared to prior to ST. No coughs nor throat clears today. SLP and pt talked in 15 minutes conversation and pt maintained AB at least 95% of the time both in and out doors. Pt and SLP agreed she could reduce to once/week.   08/08/23: Addressed employments of VCD management strategies while in prone position  d/t pt rpeorting coughing episodes frequently occursing while lying down (pilates and laying for nap). SLP reviewed strategies with occasional demonstration provided. Pt ID x3 preferred strateiges: visualization, sniff sniff "sh," and soft cough. Pt demonstrates use of strategies with mod-I. SLP moved pt to room with mat, pt remained in prone position during conversation for 15 minutes with no coughing evidenced. Upon returning to sitting, pt with cough response with stated " I couldn't stop it." SLP cues for sniff sniff blow to reduced duration and intensity with pt successfully demonstrating. Discussed setting 2 minute timer to practice rescue breathing techniques to A in ability to carryover when VCD attack occurs. Discussed challenges in "re-wiring" to reduced VCD instances and intensity with pt verbalizing appreciation. Encouraged pt to keep practicing despite not being 100%.   08/04/23: SLP reviewed all strategies (physiological and psychological) for pt to use to quell/eliminate s/sx VCD and chronic cough. Pt stated she has been feeling AB throughout the week but not necessarily during conversation - is questioning whether she uses AB during conversation or not. SLP targeted AB in conversation with pt today. First 6 minutes with SLP asking pt short answer related "wh" questions, pt with AB noted by SLP 90%. Afterwards, SLP engaged pt in conversation for 10 minutes about salient and interesting topics for pt - her success with AB was 85% today. Consider decr'ing pt's frequency to once/week.  07/27/23: Pt told SLP 3 wyas to compensate for s/sx VCD. Pt filled out VCD-Q today with above results. She used AB for 3 minutes with excellent success. She req'd min A rarely for using AB with simple "wh" questions and yes/no with AB 90% success. SLP told pt to set alarm on her watch for a time she knows she will talk, and try to talk with AB for 10 minutes after the alarm.  07/26/23: NEEDS PROM NEXT SESSION. Discussed  with pt her coughing "spell" occurring at Pilates almost every session. SLP asked and learned pt is horizontal and ?s role of GERD on pt's cough. Suggested she try alginate therapy and provided web addresses for [facility] and [facility]. SLP noted pt clearing throat 6 times and cough 2 times in first 15 minutes. SLP introduced some alternatives to throat clearing. SLP and pt reviewed these and SLP provided examples and pt then imitated SLP correctly.  Lastly, SLP taught pt how to produce AB. She was 90% successful at rest over 2 minutes. SLP told pt to practice AB 15 minutes BID.  07/20/23: SLP and pt discussed compensations for s/sx of VCD and chronic cough including visualization, and AB. SLP discussed s/sx of VCD and how AB decreases frequency of s/sx VCD and chronic cough. Pt generated her scene of relaxation which included a childhood "green space" with a small pond and a sitting area.   PATIENT  EDUCATION: Education details: see "treatment date" Person educated: Patient Education method: Explanation, Demonstration, Verbal cues, and Handouts Education comprehension: verbalized understanding, returned demonstration, verbal cues required, and needs further education  HOME EXERCISE PROGRAM: VCD techniques, and AB(eventually)  GOALS: Goals reviewed with patient? Yes  SHORT TERM GOALS: Target date: 08/19/23   Pt will complete PROM Baseline: Goal status: met   2.  Pt will demo 2 compensations she can use for s/sx VCD in 2 sessions Baseline: 07/27/23 Goal status: met   3.  Pt will report using one compensation (visualization, sniff sniff blow, pursed lip breathing, etc) successfully to decr severity of VCD between 2 sessions Baseline: 08/04/23 Goal status: met   4.  Pt will demo AB in sentence responses 80% of the time in two sessions Baseline: 08/04/23 Goal status: INITIAL     LONG TERM GOALS: Target date: 09/16/23   Pt will improve PROM compared to initial  administration Baseline:  Goal status: INITIAL   2.  Pt will report using one compensation (visualization, sniff sniff blow, pursed lip breathing, etc) successfully to decr severity of VCD between 3 sessions, after 07/29/23 Baseline: 08/10/23 Goal status: INITIAL   3.  Pt will demo AB 80% of the time in 10 minutes conversation in three sessions Baseline: 08/10/23 Goal status: INITIAL   4.  Pt will decr coughing and throat clearing to 1 instance, in three sessions Baseline: 08/10/23 Goal status: INITIAL     ASSESSMENT:   CLINICAL IMPRESSION: Patient is a 78 y.o. F who was seen today for treatment of voice function in light of chronic cough dx, and reported sx not unlike those of pt with VCD. See "treatment date" above for today's date for further details on today's session. Yvette Short stated on day of eval that she is hindered in communication both by fear of coughing while in conversation as she tells SLP that "I'm afraid it might happen when I'm talking with someone." She also mentioned she feels hindered in communication in that talking generates the coughing. She would cont to benefit from skilled ST focusing on AB and habituating use of compensations for minimizing or eliminating sx of chronic cough and those not unlike sx of VCD.    OBJECTIVE IMPAIRMENTS: include voice disorder. These impairments are limiting patient from household responsibilities, ADLs/IADLs, and effectively communicating at home and in community. Factors affecting potential to achieve goals and functional outcome are  none noted .Aaron Aas Patient will benefit from skilled SLP services to address above impairments and improve overall function.   REHAB POTENTIAL: Excellent   PLAN:   SLP FREQUENCY: 1-2x/week   SLP DURATION: 8 weeks   PLANNED INTERVENTIONS: Internal/external aids, Functional tasks, SLP instruction and feedback, Compensatory strategies, Patient/family education, and [id number] Treatment of speech (30 or 45 min)     Ival Basquez, CCC-SLP 08/10/2023, 1:59 PM   REFERRING DIAG: 05.3 (ICD-10-CM) - Chronic cough I63.89 (ICD-10-CM) - Cerebrovascular accident (CVA) due to other mechanism (HCC) R49.9 (ICD-10-CM) - Change in voice  THERAPY DIAG:  Other voice and resonance disorders  What was this (referring dx) caused by? []  Surgery []  Fall []  Ongoing issue []  Arthritis []  Other: ____________  Laterality: []  Rt []  Lt []  Both  Check all possible CPT codes:  *CHOOSE 10 OR LESS*    See Planned Interventions listed in the Plan section of the Evaluation.

## 2023-08-15 ENCOUNTER — Ambulatory Visit

## 2023-08-16 DIAGNOSIS — S46212A Strain of muscle, fascia and tendon of other parts of biceps, left arm, initial encounter: Secondary | ICD-10-CM | POA: Diagnosis not present

## 2023-08-16 DIAGNOSIS — M25512 Pain in left shoulder: Secondary | ICD-10-CM | POA: Diagnosis not present

## 2023-08-16 DIAGNOSIS — R531 Weakness: Secondary | ICD-10-CM | POA: Diagnosis not present

## 2023-08-17 ENCOUNTER — Ambulatory Visit

## 2023-08-17 DIAGNOSIS — F801 Expressive language disorder: Secondary | ICD-10-CM | POA: Diagnosis not present

## 2023-08-17 DIAGNOSIS — R498 Other voice and resonance disorders: Secondary | ICD-10-CM | POA: Diagnosis not present

## 2023-08-17 NOTE — Therapy (Signed)
 OUTPATIENT SPEECH LANGUAGE PATHOLOGY VOICE TREATMENT   Patient Name: Yvette Short MRN: [id number] DOB:16-Mar-1946, 78 y.o., female Today's Date: 08/17/2023  PCP: Napolean Backbone, DO REFERRING PROVIDER: Same as PCP  END OF SESSION:  End of Session - 08/17/23 1312     Visit Number 7    Number of Visits 13    Date for SLP Re-Evaluation 09/09/23   first ST on 08/02/23   SLP Start Time 1234    SLP Stop Time  1310    SLP Time Calculation (min) 36 min    Activity Tolerance Patient tolerated treatment well                 Past Medical History:  Diagnosis Date   Abnormal colonoscopy 03/2017   decreased rectal tone and polyp; 5 year recall   Abnormal findings on esophagogastroduodenoscopy (EGD) 02/25/2016   Z-line irrg. at 40 cm. No abnomrality of the esophagus to explain dysphagia. Esophagus dilated. H/o + H. Pylori   Allergy     ANXIETY 07/06/2007   Aortic atherosclerosis (HCC) 02/08/2018   Aortic valve regurgitation    Atherosclerosis of native coronary artery of native heart without angina pectoris 02/08/2018   Biceps tendonitis on right    Bronchiectasis without complication (HCC) 03/16/2011   CT 2012 IMPRESSION:  1. Apical scarring may account for the plain film abnormality.  2. A 6 mm right upper lobe pulmonary nodule.  - - HRCT 07/12/2017 >>>  Scattered minimal cylindrical and varicoid bronchiectasis with associated scattered mucoid impaction and minimal tree-in-bud opacity in both lungs, predominantly in the mid to lower lungs. Findings are stable to slightly worsened since 2015 ches   Chicken pox    Chronic cough 05/18/2017   CT chest 03/19/14  No bronchiectasis (not hrct)  Spirometry 05/18/2017  FEV1  2.15 (93%)  Ratio 76 s rx prior  - FENO 05/18/2017  =   Could not perform   - Allergy  profile 05/18/2017 >  Eos 0.2 /  IgE  75 RAST pos cat > dog > mold - HRCT 07/12/2017 >>>  See bronchiectasis - trial of dymista  07/26/2017 > some better but not consistent with it >  rechallenge rx one puff each am 10/25/2017  - MCT 11/02/17 >     Colon polyp 03/31/2017   hyperplastic   COVID-19 08/04/2020   mild   Diverticulosis 03/2017   sigmoid   Family history of colon cancer 04/03/2017   Formatting of this note might be different from the original. 1/19 colon - no adenomas Recommend screening exam in 1/24 - [facility]   Family history of ovarian cancer    Genetic testing 03/26/2016   Negative genetic testing on the [facility] 30 gene panel.  The 30-Gene Cancer Panel offered by [facility] includes sequencing and/or deletion duplication testing of the following 30 genes: APC, ATM, BAP1, BARD1, BMPR1A, BRCA1, BRCA2, BRIP1, CDH1, CDK4, CDKN2A (p14ARF), CDKN2A (p16INK4a), CHEK2, EPCAM, GREM1, MITF, MLH1, MSH2, MSH6, MUTYH, NBN, PALB2, PMS2, POLD1, POLE, PTEN, RAD51C, RAD51D, SMAD4,    GERD 11/09/2006   H. pylori infection    h/o treated wiht prevpac   Hormone replacement therapy 01/08/2019   HYPERLIPIDEMIA 11/09/2006   Hypertension    Hypothyroidism 08/03/2017   LOC (loss of consciousness) (HCC) 01/26/2022   Lumbar radiculopathy 07/24/2014   Migraine    Mild depression 02/14/2019   Pharyngoesophageal dysphagia 08/03/2017   Piriformis syndrome of right side    Spinal stenosis, lumbar region, with neurogenic claudication 10/16/2014   Epidural 10/02/2014,  December 2016    Stomach ulcer 2008   Past Surgical History:  Procedure Laterality Date   CATARACT EXTRACTION Bilateral 04/2021   CERVICAL POLYPECTOMY  01/17/2019   LIPOMA EXCISION     of back   TUBAL LIGATION     Patient Active Problem List   Diagnosis Date Noted   Adjustment reaction with anxiety and depression 01/14/2023   ELIQUIS -Acquired thrombophilia (HCC) 12/23/2022   Cerebrovascular accident (HCC) 07/06/2022   Atrial fibrillation (HCC) 07/06/2022   Obstructive sleep apnea on CPAP 01/27/2022   Bradycardia with 51-60 beats per minute 01/26/2022   Sleep disorder 12/17/2021   Rotator cuff tendinitis,  right 10/09/2021   Memory deficit MMSE 30/30 06/30/2021   H. pylori infection    Moderate aortic regurgitation 10/17/2019   Non-sustained ventricular tachycardia (HCC) 09/04/2019   Rheumatic aortic valve insufficiency 09/04/2019   Hormone replacement therapy 01/08/2019   Aortic atherosclerosis (HCC) 02/08/2018   Atherosclerosis of native coronary artery of native heart without angina pectoris 02/08/2018   Hypothyroidism 08/03/2017   Pharyngoesophageal dysphagia 08/03/2017   Chronic cough 05/18/2017   Spinal stenosis, lumbar region, with neurogenic claudication 10/16/2014   Lumbar radiculopathy 07/24/2014   Bronchiectasis without complication (HCC) 03/16/2011   Essential hypertension 12/21/2010   Hyperlipidemia 11/09/2006   GERD 11/09/2006   Stomach ulcer 2008    Onset date: "probably 6-7 years ago"; script dated 07/14/23  REFERRING DIAG: 05.3 (ICD-10-CM) - Chronic cough I63.89 (ICD-10-CM) - Cerebrovascular accident (CVA) due to other mechanism (HCC) R49.9 (ICD-10-CM) - Change in voice  THERAPY DIAG:  Other voice and resonance disorders  Expressive language impairment  Rationale for Evaluation and Treatment: Rehabilitation  SUBJECTIVE:   SUBJECTIVE STATEMENT: "Sometimes if I try two things and it doesn't work (during a cough episode) I just give up."  Pt accompanied by: self  PERTINENT HISTORY: Pt with CVA in 2024 and had short course of ST treating expressive communication when pt mentioned cough as well. SLP-Johnson addressed this in last few sessions with pt.  PAIN:  Are you having pain? No  FALLS: Has patient fallen in last 6 months? No,   PATIENT GOALS: "Get rid of this cough."  OBJECTIVE:  Note: Objective measures were completed at Evaluation unless otherwise noted.  DIAGNOSTIC FINDINGS:  PCP - 07/14/23 Mild depression and worry: Patient reports she feels the Lexapro  10 mg has been helpful.  She is taking the trazodone  to help with sleep nightly. Prior  note: pt reports she feels she is worrying more about her health and future. She endorses feeling down in the dumps. She is not sleeping well. Trazodone  helps some. She feels her bad days are now outweighing her good days.  She had been on effexor  a few years ago, mostly for MSK complaints, but she noticed a positive effect in her emotions on medication. She reports she had a hard time when she tried to come off that medication.   Chronic cough/voice change: Working well Gabapentin  300 mg twice daily   HTN/a.fib/Mixed hyperlipidemia/aortic atherosclerosis/AR/MR/CAD-h/o stroke Pt reports compliance with losartan12.5 mg qd, amlodipine  5 mg qd, eliquis , lipitor and sectral   Patient denies chest pain, shortness of breath, dizziness or lower extremity edema.  Speech therapy referral placed   Established with cardio. RF: [id number] CAD.stroke 08/30/2020-echo: Moderate mitral regurgitation, mild to moderate aortic regurgitation, mild to moderate tricuspid regurgitation. The right-sided pressures - mildly elevated.   Hypothyroidism, unspecified type Compliant with levo 88 mcg qd.   PATIENT REPORTED OUTCOME MEASURES (PROM): VCD-Q provided and pt  scored 40/60 with higher scores correlating with sx of VCD causing decr'd QOL.                                                                                                                            TREATMENT DATE:  Abdominal breathing=AB, Vocal Cord Dysfunction=VCD  08/17/23: Pt had 5 minute coughing episode at pilates this week. Took sips of water and got a lozenge and it was over in 5 minutes or less. SLP, given pt's "S", making a 3x5 card with her new "tools" to combat VCD sx written on it, and use it. Today pt used AB in conversation with SLP in the room, and for 10 minutes outdoors. If pt's status remains unchanged next session she may reduce frequency to once every other week.   08/10/23: Pt indicates success this morning with coughing episode for  about 6 minutes which pt states she reduced the severity and the length of the episode by using techniques for quelling s/sx chronic cough and VCD. States both s/sx VCD and cough have been reduced to less than half compared to prior to ST. No coughs nor throat clears today. SLP and pt talked in 15 minutes conversation and pt maintained AB at least 95% of the time both in and out doors. Pt and SLP agreed she could reduce to once/week.   08/08/23: Addressed employments of VCD management strategies while in prone position d/t pt rpeorting coughing episodes frequently occursing while lying down (pilates and laying for nap). SLP reviewed strategies with occasional demonstration provided. Pt ID x3 preferred strateiges: visualization, sniff sniff "sh," and soft cough. Pt demonstrates use of strategies with mod-I. SLP moved pt to room with mat, pt remained in prone position during conversation for 15 minutes with no coughing evidenced. Upon returning to sitting, pt with cough response with stated " I couldn't stop it." SLP cues for sniff sniff blow to reduced duration and intensity with pt successfully demonstrating. Discussed setting 2 minute timer to practice rescue breathing techniques to A in ability to carryover when VCD attack occurs. Discussed challenges in "re-wiring" to reduced VCD instances and intensity with pt verbalizing appreciation. Encouraged pt to keep practicing despite not being 100%.   08/04/23: SLP reviewed all strategies (physiological and psychological) for pt to use to quell/eliminate s/sx VCD and chronic cough. Pt stated she has been feeling AB throughout the week but not necessarily during conversation - is questioning whether she uses AB during conversation or not. SLP targeted AB in conversation with pt today. First 6 minutes with SLP asking pt short answer related "wh" questions, pt with AB noted by SLP 90%. Afterwards, SLP engaged pt in conversation for 10 minutes about salient and interesting  topics for pt - her success with AB was 85% today. Consider decr'ing pt's frequency to once/week.  07/27/23: Pt told SLP 3 wyas to compensate for s/sx VCD. Pt filled out VCD-Q today with above results. She used AB for 3  minutes with excellent success. She req'd min A rarely for using AB with simple "wh" questions and yes/no with AB 90% success. SLP told pt to set alarm on her watch for a time she knows she will talk, and try to talk with AB for 10 minutes after the alarm.  07/26/23: NEEDS PROM NEXT SESSION. Discussed with pt her coughing "spell" occurring at Pilates almost every session. SLP asked and learned pt is horizontal and ?s role of GERD on pt's cough. Suggested she try alginate therapy and provided web addresses for [facility] and [facility]. SLP noted pt clearing throat 6 times and cough 2 times in first 15 minutes. SLP introduced some alternatives to throat clearing. SLP and pt reviewed these and SLP provided examples and pt then imitated SLP correctly.  Lastly, SLP taught pt how to produce AB. She was 90% successful at rest over 2 minutes. SLP told pt to practice AB 15 minutes BID.  07/20/23: SLP and pt discussed compensations for s/sx of VCD and chronic cough including visualization, and AB. SLP discussed s/sx of VCD and how AB decreases frequency of s/sx VCD and chronic cough. Pt generated her scene of relaxation which included a childhood "green space" with a small pond and a sitting area.   PATIENT EDUCATION: Education details: see "treatment date" Person educated: Patient Education method: Explanation, Demonstration, Verbal cues, and Handouts Education comprehension: verbalized understanding, returned demonstration, verbal cues required, and needs further education  HOME EXERCISE PROGRAM: VCD techniques, and AB(eventually)  GOALS: Goals reviewed with patient? Yes  SHORT TERM GOALS: Target date: 08/19/23   Pt will complete PROM Baseline: Goal status: met   2.  Pt will  demo 2 compensations she can use for s/sx VCD in 2 sessions Baseline: 07/27/23 Goal status: met   3.  Pt will report using one compensation (visualization, sniff sniff blow, pursed lip breathing, etc) successfully to decr severity of VCD between 2 sessions Baseline: 08/04/23 Goal status: met   4.  Pt will demo AB in sentence responses 80% of the time in two sessions Baseline: 08/04/23 Goal status: INITIAL     LONG TERM GOALS: Target date: 09/16/23   Pt will improve PROM compared to initial administration Baseline:  Goal status: INITIAL   2.  Pt will report using one compensation (visualization, sniff sniff blow, pursed lip breathing, etc) successfully to decr severity of VCD between 3 sessions, after 07/29/23 Baseline: 08/10/23 Goal status: INITIAL   3.  Pt will demo AB 80% of the time in 10 minutes conversation in three sessions Baseline: 08/10/23 Goal status: INITIAL   4.  Pt will decr coughing and throat clearing to 1 instance, in three sessions Baseline: 08/10/23 Goal status: INITIAL     ASSESSMENT:   CLINICAL IMPRESSION: Patient is a 78 y.o. F who was seen today for treatment of voice function in light of chronic cough dx, and reported sx not unlike those of pt with VCD. See "treatment date" above for today's date for further details on today's session. Yvette Short stated on day of eval that she is hindered in communication both by fear of coughing while in conversation as she tells SLP that "I'm afraid it might happen when I'm talking with someone." She also mentioned she feels hindered in communication in that talking generates the coughing. She would cont to benefit from skilled ST focusing on AB and habituating use of compensations for minimizing or eliminating sx of chronic cough and those not unlike sx of VCD.  OBJECTIVE IMPAIRMENTS: include voice disorder. These impairments are limiting patient from household responsibilities, ADLs/IADLs, and effectively communicating at home and  in community. Factors affecting potential to achieve goals and functional outcome are  none noted .Aaron Aas Patient will benefit from skilled SLP services to address above impairments and improve overall function.   REHAB POTENTIAL: Excellent   PLAN:   SLP FREQUENCY: 1-2x/week   SLP DURATION: 8 weeks   PLANNED INTERVENTIONS: Internal/external aids, Functional tasks, SLP instruction and feedback, Compensatory strategies, Patient/family education, and [id number] Treatment of speech (30 or 45 min)    Belita Warsame, CCC-SLP 08/17/2023, 1:13 PM   REFERRING DIAG: 05.3 (ICD-10-CM) - Chronic cough I63.89 (ICD-10-CM) - Cerebrovascular accident (CVA) due to other mechanism (HCC) R49.9 (ICD-10-CM) - Change in voice  THERAPY DIAG:  Other voice and resonance disorders  What was this (referring dx) caused by? []  Surgery []  Fall []  Ongoing issue []  Arthritis []  Other: ____________  Laterality: []  Rt []  Lt []  Both  Check all possible CPT codes:  *CHOOSE 10 OR LESS*    See Planned Interventions listed in the Plan section of the Evaluation.

## 2023-08-17 NOTE — Patient Instructions (Signed)
   Pick 2 30-minute time periods during the day when you know you'll be talking and focus on the abdominal breathing during that time, each day.

## 2023-08-23 ENCOUNTER — Ambulatory Visit

## 2023-08-23 DIAGNOSIS — R498 Other voice and resonance disorders: Secondary | ICD-10-CM | POA: Diagnosis not present

## 2023-08-23 DIAGNOSIS — M25512 Pain in left shoulder: Secondary | ICD-10-CM | POA: Diagnosis not present

## 2023-08-23 DIAGNOSIS — S46212A Strain of muscle, fascia and tendon of other parts of biceps, left arm, initial encounter: Secondary | ICD-10-CM | POA: Diagnosis not present

## 2023-08-23 DIAGNOSIS — R531 Weakness: Secondary | ICD-10-CM | POA: Diagnosis not present

## 2023-08-23 DIAGNOSIS — F801 Expressive language disorder: Secondary | ICD-10-CM | POA: Diagnosis not present

## 2023-08-23 NOTE — Therapy (Signed)
 OUTPATIENT SPEECH LANGUAGE PATHOLOGY VOICE TREATMENT   Patient Name: Yvette Short MRN: [id number] DOB:05/07/1945, 78 y.o., female Today's Date: 08/23/2023  PCP: Napolean Backbone, DO REFERRING PROVIDER: Same as PCP  END OF SESSION:  End of Session - 08/23/23 1255     Visit Number 8    Number of Visits 13    Date for SLP Re-Evaluation 09/09/23    SLP Start Time 1101    SLP Stop Time  1129    SLP Time Calculation (min) 28 min    Activity Tolerance Patient tolerated treatment well                  Past Medical History:  Diagnosis Date   Abnormal colonoscopy 03/2017   decreased rectal tone and polyp; 5 year recall   Abnormal findings on esophagogastroduodenoscopy (EGD) 02/25/2016   Z-line irrg. at 40 cm. No abnomrality of the esophagus to explain dysphagia. Esophagus dilated. H/o + H. Pylori   Allergy     ANXIETY 07/06/2007   Aortic atherosclerosis (HCC) 02/08/2018   Aortic valve regurgitation    Atherosclerosis of native coronary artery of native heart without angina pectoris 02/08/2018   Biceps tendonitis on right    Bronchiectasis without complication (HCC) 03/16/2011   CT 2012 IMPRESSION:  1. Apical scarring may account for the plain film abnormality.  2. A 6 mm right upper lobe pulmonary nodule.  - - HRCT 07/12/2017 >>>  Scattered minimal cylindrical and varicoid bronchiectasis with associated scattered mucoid impaction and minimal tree-in-bud opacity in both lungs, predominantly in the mid to lower lungs. Findings are stable to slightly worsened since 2015 ches   Chicken pox    Chronic cough 05/18/2017   CT chest 03/19/14  No bronchiectasis (not hrct)  Spirometry 05/18/2017  FEV1  2.15 (93%)  Ratio 76 s rx prior  - FENO 05/18/2017  =   Could not perform   - Allergy  profile 05/18/2017 >  Eos 0.2 /  IgE  75 RAST pos cat > dog > mold - HRCT 07/12/2017 >>>  See bronchiectasis - trial of dymista  07/26/2017 > some better but not consistent with it > rechallenge rx one puff  each am 10/25/2017  - MCT 11/02/17 >     Colon polyp 03/31/2017   hyperplastic   COVID-19 08/04/2020   mild   Diverticulosis 03/2017   sigmoid   Family history of colon cancer 04/03/2017   Formatting of this note might be different from the original. 1/19 colon - no adenomas Recommend screening exam in 1/24 - [facility]   Family history of ovarian cancer    Genetic testing 03/26/2016   Negative genetic testing on the [facility] 30 gene panel.  The 30-Gene Cancer Panel offered by [facility] includes sequencing and/or deletion duplication testing of the following 30 genes: APC, ATM, BAP1, BARD1, BMPR1A, BRCA1, BRCA2, BRIP1, CDH1, CDK4, CDKN2A (p14ARF), CDKN2A (p16INK4a), CHEK2, EPCAM, GREM1, MITF, MLH1, MSH2, MSH6, MUTYH, NBN, PALB2, PMS2, POLD1, POLE, PTEN, RAD51C, RAD51D, SMAD4,    GERD 11/09/2006   H. pylori infection    h/o treated wiht prevpac   Hormone replacement therapy 01/08/2019   HYPERLIPIDEMIA 11/09/2006   Hypertension    Hypothyroidism 08/03/2017   LOC (loss of consciousness) (HCC) 01/26/2022   Lumbar radiculopathy 07/24/2014   Migraine    Mild depression 02/14/2019   Pharyngoesophageal dysphagia 08/03/2017   Piriformis syndrome of right side    Spinal stenosis, lumbar region, with neurogenic claudication 10/16/2014   Epidural 10/02/2014, December 2016  Stomach ulcer 2008   Past Surgical History:  Procedure Laterality Date   CATARACT EXTRACTION Bilateral 04/2021   CERVICAL POLYPECTOMY  01/17/2019   LIPOMA EXCISION     of back   TUBAL LIGATION     Patient Active Problem List   Diagnosis Date Noted   Adjustment reaction with anxiety and depression 01/14/2023   ELIQUIS -Acquired thrombophilia (HCC) 12/23/2022   Cerebrovascular accident (HCC) 07/06/2022   Atrial fibrillation (HCC) 07/06/2022   Obstructive sleep apnea on CPAP 01/27/2022   Bradycardia with 51-60 beats per minute 01/26/2022   Sleep disorder 12/17/2021   Rotator cuff tendinitis, right 10/09/2021    Memory deficit MMSE 30/30 06/30/2021   H. pylori infection    Moderate aortic regurgitation 10/17/2019   Non-sustained ventricular tachycardia (HCC) 09/04/2019   Rheumatic aortic valve insufficiency 09/04/2019   Hormone replacement therapy 01/08/2019   Aortic atherosclerosis (HCC) 02/08/2018   Atherosclerosis of native coronary artery of native heart without angina pectoris 02/08/2018   Hypothyroidism 08/03/2017   Pharyngoesophageal dysphagia 08/03/2017   Chronic cough 05/18/2017   Spinal stenosis, lumbar region, with neurogenic claudication 10/16/2014   Lumbar radiculopathy 07/24/2014   Bronchiectasis without complication (HCC) 03/16/2011   Essential hypertension 12/21/2010   Hyperlipidemia 11/09/2006   GERD 11/09/2006   Stomach ulcer 2008    Onset date: "probably 6-7 years ago"; script dated 07/14/23  REFERRING DIAG: 05.3 (ICD-10-CM) - Chronic cough I63.89 (ICD-10-CM) - Cerebrovascular accident (CVA) due to other mechanism (HCC) R49.9 (ICD-10-CM) - Change in voice  THERAPY DIAG:  Other voice and resonance disorders  Rationale for Evaluation and Treatment: Rehabilitation  SUBJECTIVE:   SUBJECTIVE STATEMENT: "Sometimes if I try two things and it doesn't work (during a cough episode) I just give up."  Pt accompanied by: self  PERTINENT HISTORY: Pt with CVA in 2024 and had short course of ST treating expressive communication when pt mentioned cough as well. SLP-Johnson addressed this in last few sessions with pt.  PAIN:  Are you having pain? No  FALLS: Has patient fallen in last 6 months? No,   PATIENT GOALS: "Get rid of this cough."  OBJECTIVE:  Note: Objective measures were completed at Evaluation unless otherwise noted.  DIAGNOSTIC FINDINGS:  PCP - 07/14/23 Mild depression and worry: Patient reports she feels the Lexapro  10 mg has been helpful.  She is taking the trazodone  to help with sleep nightly. Prior note: pt reports she feels she is worrying more about her  health and future. She endorses feeling down in the dumps. She is not sleeping well. Trazodone  helps some. She feels her bad days are now outweighing her good days.  She had been on effexor  a few years ago, mostly for MSK complaints, but she noticed a positive effect in her emotions on medication. She reports she had a hard time when she tried to come off that medication.   Chronic cough/voice change: Working well Gabapentin  300 mg twice daily   HTN/a.fib/Mixed hyperlipidemia/aortic atherosclerosis/AR/MR/CAD-h/o stroke Pt reports compliance with losartan12.5 mg qd, amlodipine  5 mg qd, eliquis , lipitor and sectral   Patient denies chest pain, shortness of breath, dizziness or lower extremity edema.  Speech therapy referral placed   Established with cardio. RF: [id number] CAD.stroke 08/30/2020-echo: Moderate mitral regurgitation, mild to moderate aortic regurgitation, mild to moderate tricuspid regurgitation. The right-sided pressures - mildly elevated.   Hypothyroidism, unspecified type Compliant with levo 88 mcg qd.   PATIENT REPORTED OUTCOME MEASURES (PROM): VCD-Q provided and pt scored 40/60 with higher scores correlating with sx of  VCD causing decr'd QOL.                                                                                                                            TREATMENT DATE:  Abdominal breathing=AB, Vocal Cord Dysfunction=VCD  08/23/23: Arlene reports not having any "attacks" since last session but some episodes that were abated by her using the strategies and techniques she has learned from SLP. Today she used AB in 25 minutes of conversation in and outside of the ST room and also outdoors. She and SLP agree she has progressed to being able to attend ST every other week. Possible d/c in 1-2 more visits.  08/17/23: Pt had 5 minute coughing episode at pilates this week. Took sips of water and got a lozenge and it was over in 5 minutes or less. SLP, given pt's "S", making a  3x5 card with her new "tools" to combat VCD sx written on it, and use it. Today pt used AB in conversation with SLP in the room, and for 10 minutes outdoors. If pt's status remains unchanged next session she may reduce frequency to once every other week.   08/10/23: Pt indicates success this morning with coughing episode for about 6 minutes which pt states she reduced the severity and the length of the episode by using techniques for quelling s/sx chronic cough and VCD. States both s/sx VCD and cough have been reduced to less than half compared to prior to ST. No coughs nor throat clears today. SLP and pt talked in 15 minutes conversation and pt maintained AB at least 95% of the time both in and out doors. Pt and SLP agreed she could reduce to once/week.   08/08/23: Addressed employments of VCD management strategies while in prone position d/t pt rpeorting coughing episodes frequently occursing while lying down (pilates and laying for nap). SLP reviewed strategies with occasional demonstration provided. Pt ID x3 preferred strateiges: visualization, sniff sniff "sh," and soft cough. Pt demonstrates use of strategies with mod-I. SLP moved pt to room with mat, pt remained in prone position during conversation for 15 minutes with no coughing evidenced. Upon returning to sitting, pt with cough response with stated " I couldn't stop it." SLP cues for sniff sniff blow to reduced duration and intensity with pt successfully demonstrating. Discussed setting 2 minute timer to practice rescue breathing techniques to A in ability to carryover when VCD attack occurs. Discussed challenges in "re-wiring" to reduced VCD instances and intensity with pt verbalizing appreciation. Encouraged pt to keep practicing despite not being 100%.   08/04/23: SLP reviewed all strategies (physiological and psychological) for pt to use to quell/eliminate s/sx VCD and chronic cough. Pt stated she has been feeling AB throughout the week but not  necessarily during conversation - is questioning whether she uses AB during conversation or not. SLP targeted AB in conversation with pt today. First 6 minutes with SLP asking pt short answer related "wh" questions, pt  with AB noted by SLP 90%. Afterwards, SLP engaged pt in conversation for 10 minutes about salient and interesting topics for pt - her success with AB was 85% today. Consider decr'ing pt's frequency to once/week.  07/27/23: Pt told SLP 3 wyas to compensate for s/sx VCD. Pt filled out VCD-Q today with above results. She used AB for 3 minutes with excellent success. She req'd min A rarely for using AB with simple "wh" questions and yes/no with AB 90% success. SLP told pt to set alarm on her watch for a time she knows she will talk, and try to talk with AB for 10 minutes after the alarm.  07/26/23: NEEDS PROM NEXT SESSION. Discussed with pt her coughing "spell" occurring at Pilates almost every session. SLP asked and learned pt is horizontal and ?s role of GERD on pt's cough. Suggested she try alginate therapy and provided web addresses for [facility] and [facility]. SLP noted pt clearing throat 6 times and cough 2 times in first 15 minutes. SLP introduced some alternatives to throat clearing. SLP and pt reviewed these and SLP provided examples and pt then imitated SLP correctly.  Lastly, SLP taught pt how to produce AB. She was 90% successful at rest over 2 minutes. SLP told pt to practice AB 15 minutes BID.  07/20/23: SLP and pt discussed compensations for s/sx of VCD and chronic cough including visualization, and AB. SLP discussed s/sx of VCD and how AB decreases frequency of s/sx VCD and chronic cough. Pt generated her scene of relaxation which included a childhood "green space" with a small pond and a sitting area.   PATIENT EDUCATION: Education details: see "treatment date" Person educated: Patient Education method: Explanation, Demonstration, Verbal cues, and Handouts Education  comprehension: verbalized understanding, returned demonstration, verbal cues required, and needs further education  HOME EXERCISE PROGRAM: VCD techniques, and AB(eventually)  GOALS: Goals reviewed with patient? Yes  SHORT TERM GOALS: Target date: 08/19/23   Pt will complete PROM Baseline: Goal status: met   2.  Pt will demo 2 compensations she can use for s/sx VCD in 2 sessions Baseline: 07/27/23 Goal status: met   3.  Pt will report using one compensation (visualization, sniff sniff blow, pursed lip breathing, etc) successfully to decr severity of VCD between 2 sessions Baseline: 08/04/23 Goal status: met   4.  Pt will demo AB in sentence responses 80% of the time in two sessions Baseline: 08/04/23 Goal status: partially met     LONG TERM GOALS: Target date: 09/16/23   Pt will improve PROM compared to initial administration Baseline:  Goal status: INITIAL   2.  Pt will report using one compensation (visualization, sniff sniff blow, pursed lip breathing, etc) successfully to decr severity of VCD between 3 sessions, after 07/29/23 Baseline: 08/10/23, 08/23/23 Goal status: INITIAL   3.  Pt will demo AB 80% of the time in 10 minutes conversation in three sessions Baseline: 08/10/23, 08/23/23 Goal status: INITIAL   4.  Pt will decr coughing and throat clearing to 1 instance, in three sessions Baseline: 08/10/23,08/23/23 Goal status: INITIAL     ASSESSMENT:   CLINICAL IMPRESSION: Patient is a 78 y.o. F who was seen today for treatment of voice function in light of chronic cough dx, and reported sx not unlike those of pt with VCD. See "treatment date" above for today's date for further details on today's session. Josetta stated on day of eval that she is hindered in communication both by fear of coughing  while in conversation as she tells SLP that "I'm afraid it might happen when I'm talking with someone." She also mentioned she feels hindered in communication in that talking generates  the coughing. She would cont to benefit from skilled ST focusing on AB and habituating use of compensations for minimizing or eliminating sx of chronic cough and those not unlike sx of VCD.    OBJECTIVE IMPAIRMENTS: include voice disorder. These impairments are limiting patient from household responsibilities, ADLs/IADLs, and effectively communicating at home and in community. Factors affecting potential to achieve goals and functional outcome are  none noted .Aaron Aas Patient will benefit from skilled SLP services to address above impairments and improve overall function.   REHAB POTENTIAL: Excellent   PLAN:   SLP FREQUENCY: x1 every other week   SLP DURATION: 8 weeks   PLANNED INTERVENTIONS: Internal/external aids, Functional tasks, SLP instruction and feedback, Compensatory strategies, Patient/family education, and [address]24 Treatment of speech (30 or 45 min)    Prestyn Stanco, CCC-SLP 08/23/2023, 12:55 PM   REFERRING DIAG: 05.3 (ICD-10-CM) - Chronic cough I63.89 (ICD-10-CM) - Cerebrovascular accident (CVA) due to other mechanism (HCC) R49.9 (ICD-10-CM) - Change in voice  THERAPY DIAG:  Other voice and resonance disorders  What was this (referring dx) caused by? []  Surgery []  Fall []  Ongoing issue []  Arthritis []  Other: ____________  Laterality: []  Rt []  Lt []  Both  Check all possible CPT codes:  *CHOOSE 10 OR LESS*    See Planned Interventions listed in the Plan section of the Evaluation.

## 2023-08-25 ENCOUNTER — Encounter

## 2023-08-29 DIAGNOSIS — G4733 Obstructive sleep apnea (adult) (pediatric): Secondary | ICD-10-CM | POA: Diagnosis not present

## 2023-08-30 ENCOUNTER — Ambulatory Visit

## 2023-08-31 ENCOUNTER — Ambulatory Visit (INDEPENDENT_AMBULATORY_CARE_PROVIDER_SITE_OTHER): Payer: Medicare PPO | Admitting: *Deleted

## 2023-08-31 DIAGNOSIS — Z0000 Encounter for general adult medical examination without abnormal findings: Secondary | ICD-10-CM | POA: Diagnosis not present

## 2023-08-31 NOTE — Patient Instructions (Signed)
 Yvette Short , Thank you for taking time to come for your Medicare Wellness Visit. I appreciate your ongoing commitment to your health goals. Please review the following plan we discussed and let me know if I can assist you in the future.   Screening recommendations/referrals: Colonoscopy: no longer required Mammogram: up to date Bone Density: up to date Recommended yearly ophthalmology/optometry visit for glaucoma screening and checkup Recommended yearly dental visit for hygiene and checkup  Vaccinations: Influenza vaccine: up to date Pneumococcal vaccine: up to date Tdap vaccine: up to date Shingles vaccine: up to date        Preventive Care 65 Years and Older, Female Preventive care refers to lifestyle choices and visits with your health care provider that can promote health and wellness. What does preventive care include? A yearly physical exam. This is also called an annual well check. Dental exams once or twice a year. Routine eye exams. Ask your health care provider how often you should have your eyes checked. Personal lifestyle choices, including: Daily care of your teeth and gums. Regular physical activity. Eating a healthy diet. Avoiding tobacco and drug use. Limiting alcohol use. Practicing safe sex. Taking low-dose aspirin every day. Taking vitamin and mineral supplements as recommended by your health care provider. What happens during an annual well check? The services and screenings done by your health care provider during your annual well check will depend on your age, overall health, lifestyle risk factors, and family history of disease. Counseling  Your health care provider may ask you questions about your: Alcohol use. Tobacco use. Drug use. Emotional well-being. Home and relationship well-being. Sexual activity. Eating habits. History of falls. Memory and ability to understand (cognition). Work and work Astronomer. Reproductive health. Screening   You may have the following tests or measurements: Height, weight, and BMI. Blood pressure. Lipid and cholesterol levels. These may be checked every 5 years, or more frequently if you are over 37 years old. Skin check. Lung cancer screening. You may have this screening every year starting at age 25 if you have a 30-pack-year history of smoking and currently smoke or have quit within the past 15 years. Fecal occult blood test (FOBT) of the stool. You may have this test every year starting at age 7. Flexible sigmoidoscopy or colonoscopy. You may have a sigmoidoscopy every 5 years or a colonoscopy every 10 years starting at age 74. Hepatitis C blood test. Hepatitis B blood test. Sexually transmitted disease (STD) testing. Diabetes screening. This is done by checking your blood sugar (glucose) after you have not eaten for a while (fasting). You may have this done every 1-3 years. Bone density scan. This is done to screen for osteoporosis. You may have this done starting at age 55. Mammogram. This may be done every 1-2 years. Talk to your health care provider about how often you should have regular mammograms. Talk with your health care provider about your test results, treatment options, and if necessary, the need for more tests. Vaccines  Your health care provider may recommend certain vaccines, such as: Influenza vaccine. This is recommended every year. Tetanus, diphtheria, and acellular pertussis (Tdap, Td) vaccine. You may need a Td booster every 10 years. Zoster vaccine. You may need this after age 31. Pneumococcal 13-valent conjugate (PCV13) vaccine. One dose is recommended after age 67. Pneumococcal polysaccharide (PPSV23) vaccine. One dose is recommended after age 21. Talk to your health care provider about which screenings and vaccines you need and how often you need  them. This information is not intended to replace advice given to you by your health care provider. Make sure you discuss  any questions you have with your health care provider. Document Released: 04/11/2015 Document Revised: 12/03/2015 Document Reviewed: 01/14/2015 Elsevier Interactive Patient Education  2017 [employer].  Fall Prevention in the Home Falls can cause injuries. They can happen to people of all ages. There are many things you can do to make your home safe and to help prevent falls. What can I do on the outside of my home? Regularly fix the edges of walkways and driveways and fix any cracks. Remove anything that might make you trip as you walk through a door, such as a raised step or threshold. Trim any bushes or trees on the path to your home. Use bright outdoor lighting. Clear any walking paths of anything that might make someone trip, such as rocks or tools. Regularly check to see if handrails are loose or broken. Make sure that both sides of any steps have handrails. Any raised decks and porches should have guardrails on the edges. Have any leaves, snow, or ice cleared regularly. Use sand or salt on walking paths during winter. Clean up any spills in your garage right away. This includes oil or grease spills. What can I do in the bathroom? Use night lights. Install grab bars by the toilet and in the tub and shower. Do not use towel bars as grab bars. Use non-skid mats or decals in the tub or shower. If you need to sit down in the shower, use a plastic, non-slip stool. Keep the floor dry. Clean up any water that spills on the floor as soon as it happens. Remove soap buildup in the tub or shower regularly. Attach bath mats securely with double-sided non-slip rug tape. Do not have throw rugs and other things on the floor that can make you trip. What can I do in the bedroom? Use night lights. Make sure that you have a light by your bed that is easy to reach. Do not use any sheets or blankets that are too big for your bed. They should not hang down onto the floor. Have a firm chair that has  side arms. You can use this for support while you get dressed. Do not have throw rugs and other things on the floor that can make you trip. What can I do in the kitchen? Clean up any spills right away. Avoid walking on wet floors. Keep items that you use a lot in easy-to-reach places. If you need to reach something above you, use a strong step stool that has a grab bar. Keep electrical cords out of the way. Do not use floor polish or wax that makes floors slippery. If you must use wax, use non-skid floor wax. Do not have throw rugs and other things on the floor that can make you trip. What can I do with my stairs? Do not leave any items on the stairs. Make sure that there are handrails on both sides of the stairs and use them. Fix handrails that are broken or loose. Make sure that handrails are as long as the stairways. Check any carpeting to make sure that it is firmly attached to the stairs. Fix any carpet that is loose or worn. Avoid having throw rugs at the top or bottom of the stairs. If you do have throw rugs, attach them to the floor with carpet tape. Make sure that you have a light switch at  the top of the stairs and the bottom of the stairs. If you do not have them, ask someone to add them for you. What else can I do to help prevent falls? Wear shoes that: Do not have high heels. Have rubber bottoms. Are comfortable and fit you well. Are closed at the toe. Do not wear sandals. If you use a stepladder: Make sure that it is fully opened. Do not climb a closed stepladder. Make sure that both sides of the stepladder are locked into place. Ask someone to hold it for you, if possible. Clearly mark and make sure that you can see: Any grab bars or handrails. First and last steps. Where the edge of each step is. Use tools that help you move around (mobility aids) if they are needed. These include: Canes. Walkers. Scooters. Crutches. Turn on the lights when you go into a dark area.  Replace any light bulbs as soon as they burn out. Set up your furniture so you have a clear path. Avoid moving your furniture around. If any of your floors are uneven, fix them. If there are any pets around you, be aware of where they are. Review your medicines with your doctor. Some medicines can make you feel dizzy. This can increase your chance of falling. Ask your doctor what other things that you can do to help prevent falls. This information is not intended to replace advice given to you by your health care provider. Make sure you discuss any questions you have with your health care provider. Document Released: 01/09/2009 Document Revised: 08/21/2015 Document Reviewed: 04/19/2014 Elsevier Interactive Patient Education  2017 [employer].

## 2023-08-31 NOTE — Progress Notes (Signed)
 Subjective:   Yvette Short is a 78 y.o. female who presents for Medicare Annual (Subsequent) preventive examination.  Visit Complete: Virtual I connected with  Idell Majestic on 08/31/23 by a audio enabled telemedicine application and verified that I am speaking with the correct person using two identifiers.  Patient Location: Home  Provider Location: Home Office  I discussed the limitations of evaluation and management by telemedicine. The patient expressed understanding and agreed to proceed.  Vital Signs: Because this visit was a virtual/telehealth visit, some criteria may be missing or patient reported. Any vitals not documented were not able to be obtained and vitals that have been documented are patient reported.   Cardiac Risk Factors include: advanced age (>29men, >24 women);hypertension     Objective:     There were no vitals filed for this visit. There is no height or weight on file to calculate BMI.     08/31/2023    2:52 PM 08/25/2022    3:26 PM 04/15/2022    1:20 PM 06/02/2021    1:28 PM 05/07/2020    9:07 AM 04/15/2020   12:33 PM  Advanced Directives  Does Patient Have a Medical Advance Directive? Yes Yes Yes Yes Yes No  Type of Advance Directive Living will Healthcare Power of [place];Living will Healthcare Power of [place];Living will  Healthcare Power of [place];Living will   Does patient want to make changes to medical advance directive?    No - Patient declined    Copy of Healthcare Power of Attorney in Chart?  No - copy requested   No - copy requested     Current Medications (verified) Outpatient Encounter Medications as of 08/31/2023  Medication Sig   acebutolol  (SECTRAL ) 200 MG capsule Take 1 capsule (200 mg total) by mouth daily.   amLODipine  (NORVASC ) 5 MG tablet Take 1 tablet (5 mg total) by mouth daily.   apixaban  (ELIQUIS ) 5 MG TABS tablet Take 1 tablet (5 mg total) by mouth 2 (two) times daily.   atorvastatin  (LIPITOR) 40 MG tablet Take  1 tablet (40 mg total) by mouth daily.   Azelastine  HCl 137 MCG/SPRAY SOLN Place 1 spray into both nostrils in the morning and at bedtime.   chlorpheniramine (CHLOR-TRIMETON) 4 MG tablet Take 4 mg by mouth every 4 (four) hours as needed for allergies.   Cholecalciferol (DIALYVITE VITAMIN D  5000 PO) Take by mouth.   escitalopram  (LEXAPRO ) 10 MG tablet Take 1 tablet (10 mg total) by mouth daily.   estradiol (VIVELLE-DOT) 0.0375 MG/24HR Place 1 patch onto the skin 2 (two) times a week.   fluocinonide cream (LIDEX) 0.05 % APPLY AS DIRECTED TO AFFECTED AREA   gabapentin  (NEURONTIN ) 300 MG capsule Take 1 capsule (300 mg total) by mouth 2 (two) times daily.   levothyroxine  (SYNTHROID ) 88 MCG tablet Take 1 tablet (88 mcg total) by mouth daily.   losartan  (COZAAR ) 25 MG tablet Take 0.5 tablets (12.5 mg total) by mouth daily.   MAGNESIUM PO Take by mouth.   progesterone (PROMETRIUM) 100 MG capsule Take 100 mg by mouth in the morning and at bedtime. TAKING DAILY   traZODone  (DESYREL ) 50 MG tablet Take 0.5-1 tablets (25-50 mg total) by mouth at bedtime as needed for sleep. Take half tablet by mouth as needed at bedtime   VITAMIN K PO Take by mouth.   No facility-administered encounter medications on file as of 08/31/2023.    Allergies (verified) Tetracycline hcl   History: Past Medical History:  Diagnosis Date  Abnormal colonoscopy 03/2017   decreased rectal tone and polyp; 5 year recall   Abnormal findings on esophagogastroduodenoscopy (EGD) 02/25/2016   Z-line irrg. at 40 cm. No abnomrality of the esophagus to explain dysphagia. Esophagus dilated. H/o + H. Pylori   Allergy     ANXIETY 07/06/2007   Aortic atherosclerosis (HCC) 02/08/2018   Aortic valve regurgitation    Atherosclerosis of native coronary artery of native heart without angina pectoris 02/08/2018   Biceps tendonitis on right    Bronchiectasis without complication (HCC) 03/16/2011   CT 2012 IMPRESSION:  1. Apical scarring may  account for the plain film abnormality.  2. A 6 mm right upper lobe pulmonary nodule.  - - HRCT 07/12/2017 >>>  Scattered minimal cylindrical and varicoid bronchiectasis with associated scattered mucoid impaction and minimal tree-in-bud opacity in both lungs, predominantly in the mid to lower lungs. Findings are stable to slightly worsened since 2015 ches   Chicken pox    Chronic cough 05/18/2017   CT chest 03/19/14  No bronchiectasis (not hrct)  Spirometry 05/18/2017  FEV1  2.15 (93%)  Ratio 76 s rx prior  - FENO 05/18/2017  =   Could not perform   - Allergy  profile 05/18/2017 >  Eos 0.2 /  IgE  75 RAST pos cat > dog > mold - HRCT 07/12/2017 >>>  See bronchiectasis - trial of dymista  07/26/2017 > some better but not consistent with it > rechallenge rx one puff each am 10/25/2017  - MCT 11/02/17 >     Colon polyp 03/31/2017   hyperplastic   COVID-19 08/04/2020   mild   Diverticulosis 03/2017   sigmoid   Family history of colon cancer 04/03/2017   Formatting of this note might be different from the original. 1/19 colon - no adenomas Recommend screening exam in 1/24 - [facility]   Family history of ovarian cancer    Genetic testing 03/26/2016   Negative genetic testing on the [facility] 30 gene panel.  The 30-Gene Cancer Panel offered by [facility] includes sequencing and/or deletion duplication testing of the following 30 genes: APC, ATM, BAP1, BARD1, BMPR1A, BRCA1, BRCA2, BRIP1, CDH1, CDK4, CDKN2A (p14ARF), CDKN2A (p16INK4a), CHEK2, EPCAM, GREM1, MITF, MLH1, MSH2, MSH6, MUTYH, NBN, PALB2, PMS2, POLD1, POLE, PTEN, RAD51C, RAD51D, SMAD4,    GERD 11/09/2006   H. pylori infection    h/o treated wiht prevpac   Hormone replacement therapy 01/08/2019   HYPERLIPIDEMIA 11/09/2006   Hypertension    Hypothyroidism 08/03/2017   LOC (loss of consciousness) (HCC) 01/26/2022   Lumbar radiculopathy 07/24/2014   Migraine    Mild depression 02/14/2019   Pharyngoesophageal dysphagia 08/03/2017   Piriformis syndrome of  right side    Spinal stenosis, lumbar region, with neurogenic claudication 10/16/2014   Epidural 10/02/2014, December 2016    Stomach ulcer 2008   Past Surgical History:  Procedure Laterality Date   CATARACT EXTRACTION Bilateral 04/2021   CERVICAL POLYPECTOMY  01/17/2019   LIPOMA EXCISION     of back   TUBAL LIGATION     Family History  Problem Relation Age of Onset   Dementia Mother    Hyperlipidemia Mother    Colon cancer Father 74   Colon polyps Brother        gets colonoscopy every 2-3 years   Hyperlipidemia Brother    Hypertension Brother    Uterine cancer Maternal Aunt        dx in her 24s   Colon cancer Maternal Uncle    Colon cancer  Paternal Aunt    Heart attack Maternal Grandfather    Cancer Other        MGMs sister - "abdominal cancer"   Breast cancer Cousin    Social History   Socioeconomic History   Marital status: Married    Spouse name: Not on file   Number of children: Not on file   Years of education: Not on file   Highest education level: Not on file  Occupational History   Not on file  Tobacco Use   Smoking status: Former    Current packs/day: 0.00    Average packs/day: 2.5 packs/day for 13.0 years (32.5 ttl pk-yrs)    Types: Cigarettes    Start date: 07/27/1961    Quit date: 07/28/1974    Years since quitting: 49.1   Smokeless tobacco: Never  Vaping Use   Vaping status: Never Used  Substance and Sexual Activity   Alcohol use: Yes    Alcohol/week: 2.0 standard drinks of alcohol    Types: 2 Cans of beer per week   Drug use: No   Sexual activity: Yes    Partners: Male  Other Topics Concern   Not on file  Social History Narrative   Married. Two children.    College grad.    Former smoker.    Exercises routinely.    Drink caffeine.    Wears a hearing aid.    Smoke alarm in the home.    Wears her seatbelt.    Feels safe in her relationships.    Social Drivers of Corporate investment banker Strain: Low Risk  (08/31/2023)   Overall  Financial Resource Strain (CARDIA)    Difficulty of Paying Living Expenses: Not hard at all  Food Insecurity: No Food Insecurity (08/31/2023)   Hunger Vital Sign    Worried About Running Out of Food in the Last Year: Never true    Ran Out of Food in the Last Year: Never true  Transportation Needs: No Transportation Needs (08/31/2023)   PRAPARE - Administrator, Civil Service (Medical): No    Lack of Transportation (Non-Medical): No  Physical Activity: Sufficiently Active (08/31/2023)   Exercise Vital Sign    Days of Exercise per Week: 4 days    Minutes of Exercise per Session: 50 min  Stress: No Stress Concern Present (08/31/2023)   [employer] of Occupational Health - Occupational Stress Questionnaire    Feeling of Stress : Not at all  Social Connections: Socially Integrated (08/31/2023)   Social Connection and Isolation Panel [NHANES]    Frequency of Communication with Friends and Family: More than three times a week    Frequency of Social Gatherings with Friends and Family: Once a week    Attends Religious Services: More than 4 times per year    Active Member of [employer] or Organizations: Yes    Attends Engineer, structural: More than 4 times per year    Marital Status: Married    Tobacco Counseling Counseling given: Not Answered   Clinical Intake:  Pre-visit preparation completed: Yes  Pain : No/denies pain     Diabetes: No  How often do you need to have someone help you when you read instructions, pamphlets, or other written materials from your doctor or pharmacy?: 1 - Never  Interpreter Needed?: No  Information entered by :: Kieth Pelt LPN   Activities of Daily Living    08/31/2023    2:53 PM  In your present state of  health, do you have any difficulty performing the following activities:  Hearing? 1  Vision? 0  Difficulty concentrating or making decisions? 0  Walking or climbing stairs? 0  Dressing or bathing? 0  Doing errands, shopping? 0   Preparing Food and eating ? N  Using the Toilet? N  In the past six months, have you accidently leaked urine? Y  Do you have problems with loss of bowel control? N  Managing your Medications? N  Managing your Finances? N  Housekeeping or managing your Housekeeping? N    Patient Care Team: Mariel Shope, DO as PCP - General (Family Medicine) Tobb, Kardie, DO as PCP - Cardiology (Cardiology) Thurman Flores, MD as Consulting Physician (Obstetrics and Gynecology) Diamond Formica, MD as Consulting Physician (Pulmonary Disease) Isidro Margo, DO as Consulting Physician (Sports Medicine) Walda Guiles, MD as Referring Physician (Gastroenterology) Princella Brooklyn, OD (Optometry) Tobb, Kardie, DO as Consulting Physician (Cardiology)  Indicate any recent Medical Services you may have received from other than [facility] providers in the past year (date may be approximate).     Assessment:    This is a routine wellness examination for [place].  Hearing/Vision screen Hearing Screening - Comments:: Hearing aids bilateral Vision Screening - Comments:: Up to date Unsure of name   Goals Addressed               This Visit's Progress     Increase My Muscle Strength (pt-stated)   On track      Why is this important?   Walking and easy exercises make you stronger. These also give you energy.  Every little bit helps, at any age.          Patient Stated   On track     Maintain current health & activity level      Patient Stated (pt-stated)   On track     None at this time       Patient Stated        Stay healthy       Depression Screen    08/31/2023    2:57 PM 01/14/2023   10:14 AM 12/23/2022    8:24 AM 08/25/2022    3:24 PM 07/06/2022    8:20 AM 06/02/2021    1:16 PM 01/07/2021    8:38 AM  PHQ 2/9 Scores  PHQ - 2 Score 0 2 1 0 0 0 0  PHQ- 9 Score 1 11     0    Fall Risk    08/31/2023    2:51 PM 01/14/2023   10:14 AM 12/23/2022    8:23 AM 08/25/2022    3:26 PM 07/06/2022     8:19 AM  Fall Risk   Falls in the past year? 0 0 0 0 0  Number falls in past yr: 0 0 0 0 0  Injury with Fall? 0 0 0 0 0  Risk for fall due to :  No Fall Risks No Fall Risks Impaired vision;Impaired balance/gait   Follow up Falls evaluation completed;Education provided;Falls prevention discussed Falls evaluation completed Falls evaluation completed Falls prevention discussed     MEDICARE RISK AT HOME: Medicare Risk at Home Any stairs in or around the home?: Yes If so, are there any without handrails?: No Home free of loose throw rugs in walkways, pet beds, electrical cords, etc?: Yes Adequate lighting in your home to reduce risk of falls?: Yes Life alert?: No Use of a cane, walker or w/c?: No  Grab bars in the bathroom?: No Shower chair or bench in shower?: Yes Elevated toilet seat or a handicapped toilet?: Yes  TIMED UP AND GO:  Was the test performed?  No    Cognitive Function:    12/23/2022    9:02 AM  MMSE - Mini Mental State Exam  Orientation to time 5  Orientation to Place 5  Registration 3  Attention/ Calculation 5  Recall 3  Language- name 2 objects 2  Language- repeat 1  Language- follow 3 step command 3  Language- read & follow direction 1  Write a sentence 1  Copy design 1  Total score 30        08/31/2023    2:55 PM 08/25/2022    3:28 PM 06/02/2021    1:20 PM  6CIT Screen  What Year?  0 points 0 points  What month?  0 points 0 points  What time? 0 points 0 points 0 points  Count back from 20 0 points 0 points 0 points  Months in reverse 0 points 0 points 0 points  Repeat phrase 0 points 0 points 0 points  Total Score  0 points 0 points    Immunizations Immunization History  Administered Date(s) Administered   Fluad Quad(high Dose 65+) 12/25/2019, 11/30/2021   Influenza Split 12/21/2010, 12/21/2011   Influenza Whole 01/04/2008, 12/31/2008, 01/14/2010   Influenza, High Dose Seasonal PF 01/03/2013, 12/26/2013, 02/12/2016, 12/28/2016, 12/05/2020,  12/01/2022   Influenza-Unspecified 12/06/2017   PFIZER Comirnaty(Gray Top)Covid-19 Tri-Sucrose Vaccine 12/23/2021   PFIZER(Purple Top)SARS-COV-2 Vaccination 04/22/2019, 05/14/2019, 01/25/2020   Pfizer Covid-19 Vaccine Bivalent Booster 74yrs & up 12/05/2020   Pfizer(Comirnaty)Fall Seasonal Vaccine 12 years and older 12/01/2022   Pneumococcal Conjugate-13 02/16/2016   Pneumococcal Polysaccharide-23 04/10/2012   Respiratory Syncytial Virus Vaccine,Recomb Aduvanted(Arexvy) 12/23/2021   Td 03/09/2001, 12/10/2008   Tdap 12/10/2008, 12/28/2016   Zoster Recombinant(Shingrix ) 08/03/2017, 12/29/2017, 12/29/2017   Zoster, Live 12/21/2010    TDAP status: Up to date  Flu Vaccine status: Up to date  Pneumococcal vaccine status: Up to date  Covid-19 vaccine status: Declined, Education has been provided regarding the importance of this vaccine but patient still declined. Advised may receive this vaccine at local pharmacy or Health Dept.or vaccine clinic. Aware to provide a copy of the vaccination record if obtained from local pharmacy or Health Dept. Verbalized acceptance and understanding.  Qualifies for Shingles Vaccine? No   Zostavax completed Yes   Shingrix  Completed?: Yes  Screening Tests Health Maintenance  Topic Date Due   INFLUENZA VACCINE  10/28/2023   MAMMOGRAM  07/24/2024   Medicare Annual Wellness (AWV)  08/30/2024   DTaP/Tdap/Td (5 - Td or Tdap) 12/29/2026   Pneumonia Vaccine 48+ Years old  Completed   DEXA SCAN  Completed   Hepatitis C Screening  Completed   Zoster Vaccines- Shingrix   Completed   HPV VACCINES  Aged Out   Meningococcal B Vaccine  Aged Out   Colonoscopy  Discontinued   COVID-19 Vaccine  Discontinued    Health Maintenance  There are no preventive care reminders to display for this patient.   Colorectal cancer screening: No longer required.   Mammogram status: Completed  . Repeat every year  Bone Density status: Completed 2018. Results reflect: Bone  density results: NORMAL. Repeat every 0 years.  Lung Cancer Screening: (Low Dose CT Chest recommended if Age 76-80 years, 20 pack-year currently smoking OR have quit w/in 15years.) does not qualify.   Lung Cancer Screening Referral:   Additional Screening:  Hepatitis  C Screening: does not qualify; Completed 2018  Vision Screening: Recommended annual ophthalmology exams for early detection of glaucoma and other disorders of the eye. Is the patient up to date with their annual eye exam?  Yes  Who is the provider or what is the name of the office in which the patient attends annual eye exams? Unsure of name If pt is not established with a provider, would they like to be referred to a provider to establish care? No .   Dental Screening: Recommended annual dental exams for proper oral hygiene   Community Resource Referral / Chronic Care Management: CRR required this visit?  No   CCM required this visit?  No     Plan:     I have personally reviewed and noted the following in the patient's chart:   Medical and social history Use of alcohol, tobacco or illicit drugs  Current medications and supplements including opioid prescriptions. Patient is not currently taking opioid prescriptions. Functional ability and status Nutritional status Physical activity Advanced directives List of other physicians Hospitalizations, surgeries, and ER visits in previous 12 months Vitals Screenings to include cognitive, depression, and falls Referrals and appointments  In addition, I have reviewed and discussed with patient certain preventive protocols, quality metrics, and best practice recommendations. A written personalized care plan for preventive services as well as general preventive health recommendations were provided to patient.     Kieth Pelt, LPN   10/28/9560   After Visit Summary: (MyChart) Due to this being a telephonic visit, the after visit summary with patients personalized plan  was offered to patient via MyChart   Nurse Notes:

## 2023-09-02 ENCOUNTER — Encounter

## 2023-09-06 ENCOUNTER — Ambulatory Visit: Attending: Family Medicine

## 2023-09-06 DIAGNOSIS — R498 Other voice and resonance disorders: Secondary | ICD-10-CM | POA: Insufficient documentation

## 2023-09-06 DIAGNOSIS — F801 Expressive language disorder: Secondary | ICD-10-CM | POA: Diagnosis not present

## 2023-09-06 NOTE — Therapy (Unsigned)
 OUTPATIENT SPEECH LANGUAGE PATHOLOGY VOICE TREATMENT   Patient Name: Yvette Short MRN: [id number] DOB:24-Nov-1945, 78 y.o., female Today's Date: 09/06/2023  PCP: Napolean Backbone, DO REFERRING PROVIDER: Same as PCP  END OF SESSION:  End of Session - 09/06/23 1113     Visit Number 9    Number of Visits 13    Date for SLP Re-Evaluation 09/09/23    SLP Start Time 1104    SLP Stop Time  1145    SLP Time Calculation (min) 41 min    Activity Tolerance Patient tolerated treatment well                  Past Medical History:  Diagnosis Date   Abnormal colonoscopy 03/2017   decreased rectal tone and polyp; 5 year recall   Abnormal findings on esophagogastroduodenoscopy (EGD) 02/25/2016   Z-line irrg. at 40 cm. No abnomrality of the esophagus to explain dysphagia. Esophagus dilated. H/o + H. Pylori   Allergy     ANXIETY 07/06/2007   Aortic atherosclerosis (HCC) 02/08/2018   Aortic valve regurgitation    Atherosclerosis of native coronary artery of native heart without angina pectoris 02/08/2018   Biceps tendonitis on right    Bronchiectasis without complication (HCC) 03/16/2011   CT 2012 IMPRESSION:  1. Apical scarring may account for the plain film abnormality.  2. A 6 mm right upper lobe pulmonary nodule.  - - HRCT 07/12/2017 >>>  Scattered minimal cylindrical and varicoid bronchiectasis with associated scattered mucoid impaction and minimal tree-in-bud opacity in both lungs, predominantly in the mid to lower lungs. Findings are stable to slightly worsened since 2015 ches   Chicken pox    Chronic cough 05/18/2017   CT chest 03/19/14  No bronchiectasis (not hrct)  Spirometry 05/18/2017  FEV1  2.15 (93%)  Ratio 76 s rx prior  - FENO 05/18/2017  =   Could not perform   - Allergy  profile 05/18/2017 >  Eos 0.2 /  IgE  75 RAST pos cat > dog > mold - HRCT 07/12/2017 >>>  See bronchiectasis - trial of dymista  07/26/2017 > some better but not consistent with it > rechallenge rx one puff  each am 10/25/2017  - MCT 11/02/17 >     Colon polyp 03/31/2017   hyperplastic   COVID-19 08/04/2020   mild   Diverticulosis 03/2017   sigmoid   Family history of colon cancer 04/03/2017   Formatting of this note might be different from the original. 1/19 colon - no adenomas Recommend screening exam in 1/24 - [facility]   Family history of ovarian cancer    Genetic testing 03/26/2016   Negative genetic testing on the [facility] 30 gene panel.  The 30-Gene Cancer Panel offered by [facility] includes sequencing and/or deletion duplication testing of the following 30 genes: APC, ATM, BAP1, BARD1, BMPR1A, BRCA1, BRCA2, BRIP1, CDH1, CDK4, CDKN2A (p14ARF), CDKN2A (p16INK4a), CHEK2, EPCAM, GREM1, MITF, MLH1, MSH2, MSH6, MUTYH, NBN, PALB2, PMS2, POLD1, POLE, PTEN, RAD51C, RAD51D, SMAD4,    GERD 11/09/2006   H. pylori infection    h/o treated wiht prevpac   Hormone replacement therapy 01/08/2019   HYPERLIPIDEMIA 11/09/2006   Hypertension    Hypothyroidism 08/03/2017   LOC (loss of consciousness) (HCC) 01/26/2022   Lumbar radiculopathy 07/24/2014   Migraine    Mild depression 02/14/2019   Pharyngoesophageal dysphagia 08/03/2017   Piriformis syndrome of right side    Spinal stenosis, lumbar region, with neurogenic claudication 10/16/2014   Epidural 10/02/2014, December 2016  Stomach ulcer 2008   Past Surgical History:  Procedure Laterality Date   CATARACT EXTRACTION Bilateral 04/2021   CERVICAL POLYPECTOMY  01/17/2019   LIPOMA EXCISION     of back   TUBAL LIGATION     Patient Active Problem List   Diagnosis Date Noted   Adjustment reaction with anxiety and depression 01/14/2023   ELIQUIS -Acquired thrombophilia (HCC) 12/23/2022   Cerebrovascular accident (HCC) 07/06/2022   Atrial fibrillation (HCC) 07/06/2022   Obstructive sleep apnea on CPAP 01/27/2022   Bradycardia with 51-60 beats per minute 01/26/2022   Sleep disorder 12/17/2021   Rotator cuff tendinitis, right 10/09/2021    Memory deficit MMSE 30/30 06/30/2021   H. pylori infection    Moderate aortic regurgitation 10/17/2019   Non-sustained ventricular tachycardia (HCC) 09/04/2019   Rheumatic aortic valve insufficiency 09/04/2019   Hormone replacement therapy 01/08/2019   Aortic atherosclerosis (HCC) 02/08/2018   Atherosclerosis of native coronary artery of native heart without angina pectoris 02/08/2018   Hypothyroidism 08/03/2017   Pharyngoesophageal dysphagia 08/03/2017   Chronic cough 05/18/2017   Spinal stenosis, lumbar region, with neurogenic claudication 10/16/2014   Lumbar radiculopathy 07/24/2014   Bronchiectasis without complication (HCC) 03/16/2011   Essential hypertension 12/21/2010   Hyperlipidemia 11/09/2006   GERD 11/09/2006   Stomach ulcer 2008    Onset date: probably 6-7 years ago; script dated 07/14/23  REFERRING DIAG: 05.3 (ICD-10-CM) - Chronic cough I63.89 (ICD-10-CM) - Cerebrovascular accident (CVA) due to other mechanism (HCC) R49.9 (ICD-10-CM) - Change in voice  THERAPY DIAG:  Other voice and resonance disorders  Expressive language impairment  Rationale for Evaluation and Treatment: Rehabilitation  SUBJECTIVE:   SUBJECTIVE STATEMENT: Pt brought two index cards with strategies to quell s/sx irritable larynx/VCD and chronic cough.  Pt accompanied by: self  PERTINENT HISTORY: Pt with CVA in 2024 and had short course of ST treating expressive communication when pt mentioned cough as well. SLP-Johnson addressed this in last few sessions with pt.  PAIN:  Are you having pain? No  FALLS: Has patient fallen in last 6 months? No,   PATIENT GOALS: Get rid of this cough.  OBJECTIVE:  Note: Objective measures were completed at Evaluation unless otherwise noted.  DIAGNOSTIC FINDINGS:  PCP - 07/14/23 Mild depression and worry: Patient reports she feels the Lexapro  10 mg has been helpful.  She is taking the trazodone  to help with sleep nightly. Prior note: pt reports she  feels she is worrying more about her health and future. She endorses feeling down in the dumps. She is not sleeping well. Trazodone  helps some. She feels her bad days are now outweighing her good days.  She had been on effexor  a few years ago, mostly for MSK complaints, but she noticed a positive effect in her emotions on medication. She reports she had a hard time when she tried to come off that medication.   Chronic cough/voice change: Working well Gabapentin  300 mg twice daily   HTN/a.fib/Mixed hyperlipidemia/aortic atherosclerosis/AR/MR/CAD-h/o stroke Pt reports compliance with losartan12.5 mg qd, amlodipine  5 mg qd, eliquis , lipitor and sectral   Patient denies chest pain, shortness of breath, dizziness or lower extremity edema.  Speech therapy referral placed   Established with cardio. RF: [email or web address] CAD.stroke 08/30/2020-echo: Moderate mitral regurgitation, mild to moderate aortic regurgitation, mild to moderate tricuspid regurgitation. The right-sided pressures - mildly elevated.   Hypothyroidism, unspecified type Compliant with levo 88 mcg qd.   PATIENT REPORTED OUTCOME MEASURES (PROM): VCD-Q provided and pt scored 40/60 with higher scores correlating with sx  of VCD causing decr'd QOL.                                                                                                                            TREATMENT DATE:  Abdominal breathing=AB, Vocal Cord Dysfunction=VCD  09/06/23: Yvette Short tells SLP her symptoms have decr'd 40% since prior to ST. More episodes in last two weeks than between 2 sessions ago and the previous session. Pt stated she coughs within the first 5-10 minutes of lying down. SLP suggested she try alginate therapy and provided some websites for her to look at and possibly try.  15 minutes of conversation in therapy room - pt used AB.  Outdoors for 5 minutes pt reported she used AB. She would prefer one more session to ensure success over time, and is generally  pleased with progress.  08/23/23: Yvette Short reports not having any attacks since last session but some episodes that were abated by her using the strategies and techniques she has learned from SLP. Today she used AB in 25 minutes of conversation in and outside of the ST room and also outdoors. She and SLP agree she has progressed to being able to attend ST every other week. Possible d/c in 1-2 more visits.  08/17/23: Pt had 5 minute coughing episode at pilates this week. Took sips of water and got a lozenge and it was over in 5 minutes or less. SLP, given pt's S, making a 3x5 card with her new tools to combat VCD sx written on it, and use it. Today pt used AB in conversation with SLP in the room, and for 10 minutes outdoors. If pt's status remains unchanged next session she may reduce frequency to once every other week.   08/10/23: Pt indicates success this morning with coughing episode for about 6 minutes which pt states she reduced the severity and the length of the episode by using techniques for quelling s/sx chronic cough and VCD. States both s/sx VCD and cough have been reduced to less than half compared to prior to ST. No coughs nor throat clears today. SLP and pt talked in 15 minutes conversation and pt maintained AB at least 95% of the time both in and out doors. Pt and SLP agreed she could reduce to once/week.   08/08/23: Addressed employments of VCD management strategies while in prone position d/t pt rpeorting coughing episodes frequently occursing while lying down (pilates and laying for nap). SLP reviewed strategies with occasional demonstration provided. Pt ID x3 preferred strateiges: visualization, sniff sniff sh, and soft cough. Pt demonstrates use of strategies with mod-I. SLP moved pt to room with mat, pt remained in prone position during conversation for 15 minutes with no coughing evidenced. Upon returning to sitting, pt with cough response with stated  I couldn't stop it. SLP cues  for sniff sniff blow to reduced duration and intensity with pt successfully demonstrating. Discussed setting 2 minute timer to practice rescue breathing techniques to A  in ability to carryover when VCD attack occurs. Discussed challenges in re-wiring to reduced VCD instances and intensity with pt verbalizing appreciation. Encouraged pt to keep practicing despite not being 100%.   08/04/23: SLP reviewed all strategies (physiological and psychological) for pt to use to quell/eliminate s/sx VCD and chronic cough. Pt stated she has been feeling AB throughout the week but not necessarily during conversation - is questioning whether she uses AB during conversation or not. SLP targeted AB in conversation with pt today. First 6 minutes with SLP asking pt short answer related wh questions, pt with AB noted by SLP 90%. Afterwards, SLP engaged pt in conversation for 10 minutes about salient and interesting topics for pt - her success with AB was 85% today. Consider decr'ing pt's frequency to once/week.  07/27/23: Pt told SLP 3 wyas to compensate for s/sx VCD. Pt filled out VCD-Q today with above results. She used AB for 3 minutes with excellent success. She req'd min A rarely for using AB with simple wh questions and yes/no with AB 90% success. SLP told pt to set alarm on her watch for a time she knows she will talk, and try to talk with AB for 10 minutes after the alarm.  07/26/23: NEEDS PROM NEXT SESSION. Discussed with pt her coughing spell occurring at Pilates almost every session. SLP asked and learned pt is horizontal and ?s role of GERD on pt's cough. Suggested she try alginate therapy and provided web addresses for [employer] and [employer]. SLP noted pt clearing throat 6 times and cough 2 times in first 15 minutes. SLP introduced some alternatives to throat clearing. SLP and pt reviewed these and SLP provided examples and pt then imitated SLP correctly.  Lastly, SLP taught pt how to produce AB. She  was 90% successful at rest over 2 minutes. SLP told pt to practice AB 15 minutes BID.  07/20/23: SLP and pt discussed compensations for s/sx of VCD and chronic cough including visualization, and AB. SLP discussed s/sx of VCD and how AB decreases frequency of s/sx VCD and chronic cough. Pt generated her scene of relaxation which included a childhood green space with a small pond and a sitting area.   PATIENT EDUCATION: Education details: see treatment date Person educated: Patient Education method: Explanation, Demonstration, Verbal cues, and Handouts Education comprehension: verbalized understanding, returned demonstration, verbal cues required, and needs further education  HOME EXERCISE PROGRAM: VCD techniques, and AB  GOALS: Goals reviewed with patient? Yes  SHORT TERM GOALS: Target date: 08/19/23   Pt will complete PROM Baseline: Goal status: met   2.  Pt will demo 2 compensations she can use for s/sx VCD in 2 sessions Baseline: 07/27/23 Goal status: met   3.  Pt will report using one compensation (visualization, sniff sniff blow, pursed lip breathing, etc) successfully to decr severity of VCD between 2 sessions Baseline: 08/04/23 Goal status: met   4.  Pt will demo AB in sentence responses 80% of the time in two sessions Baseline: 08/04/23 Goal status: partially met     LONG TERM GOALS: Target date: 09/16/23   Pt will improve PROM compared to initial administration Baseline:  Goal status: INITIAL   2.  Pt will report using one compensation (visualization, sniff sniff blow, pursed lip breathing, etc) successfully to decr severity of VCD between 3 sessions, after 07/29/23 Baseline: 08/10/23, 08/23/23, 09/06/23 Goal status: met   3.  Pt will demo AB 80% of the time in 10 minutes conversation in  three sessions Baseline: 08/10/23, 08/23/23 Goal status: met   4.  Pt will decr coughing and throat clearing to 1 instance, in three sessions Baseline: 08/10/23,08/23/23 Goal status:  met     ASSESSMENT:   CLINICAL IMPRESSION: Patient is a 78 y.o. F who was seen today for treatment of voice function in light of chronic cough dx, and reported sx not unlike those of pt with VCD. See treatment date above for today's date for further details on today's session. Yvette Short stated on day of eval that she is hindered in communication both by fear of coughing while in conversation as she tells SLP that I'm afraid it might happen when I'm talking with someone. She also mentioned she feels hindered in communication in that talking generates the coughing. She would cont to benefit from skilled ST focusing on AB and habituating use of compensations for minimizing or eliminating sx of chronic cough and those not unlike sx of VCD.    OBJECTIVE IMPAIRMENTS: include voice disorder. These impairments are limiting patient from household responsibilities, ADLs/IADLs, and effectively communicating at home and in community. Factors affecting potential to achieve goals and functional outcome are  none noted .Yvette Short Patient will benefit from skilled SLP services to address above impairments and improve overall function.   REHAB POTENTIAL: Excellent   PLAN:   SLP FREQUENCY: x1 every other week   SLP DURATION: 8 weeks   PLANNED INTERVENTIONS: Internal/external aids, Functional tasks, SLP instruction and feedback, Compensatory strategies, Patient/family education, and [id number] Treatment of speech (30 or 45 min)    Yvette Short, CCC-SLP 09/06/2023, 11:24 AM   REFERRING DIAG: 05.3 (ICD-10-CM) - Chronic cough I63.89 (ICD-10-CM) - Cerebrovascular accident (CVA) due to other mechanism (HCC) R49.9 (ICD-10-CM) - Change in voice  THERAPY DIAG:  Other voice and resonance disorders  What was this (referring dx) caused by? []  Surgery []  Fall []  Ongoing issue []  Arthritis []  Other: ____________  Laterality: []  Rt []  Lt []  Both  Check all possible CPT codes:  *CHOOSE 10 OR LESS*    See Planned  Interventions listed in the Plan section of the Evaluation.

## 2023-09-13 ENCOUNTER — Encounter

## 2023-09-20 ENCOUNTER — Ambulatory Visit

## 2023-09-20 DIAGNOSIS — F801 Expressive language disorder: Secondary | ICD-10-CM | POA: Diagnosis not present

## 2023-09-20 DIAGNOSIS — R498 Other voice and resonance disorders: Secondary | ICD-10-CM | POA: Diagnosis not present

## 2023-09-20 NOTE — Therapy (Signed)
 OUTPATIENT SPEECH LANGUAGE PATHOLOGY VOICE TREATMENT/ Renewal-Discharge Summary   Patient Name: Yvette Short MRN: [id number] DOB:[birth year], 78 y.o., female Today's Date: 09/20/2023  PCP: Catherine Fuller, DO REFERRING PROVIDER: Same as PCP  END OF SESSION:  End of Session - 09/20/23 1148     Visit Number 10    Number of Visits 13    Date for SLP Re-Evaluation 09/20/23    SLP Start Time 1103    SLP Stop Time  1146    SLP Time Calculation (min) 43 min    Activity Tolerance Patient tolerated treatment well                Past Medical History:  Diagnosis Date   Abnormal colonoscopy 03/2017   decreased rectal tone and polyp; 5 year recall   Abnormal findings on esophagogastroduodenoscopy (EGD) 02/25/2016   Z-line irrg. at 40 cm. No abnomrality of the esophagus to explain dysphagia. Esophagus dilated. H/o + H. Pylori   Allergy     ANXIETY 07/06/2007   Aortic atherosclerosis (HCC) 02/08/2018   Aortic valve regurgitation    Atherosclerosis of native coronary artery of native heart without angina pectoris 02/08/2018   Biceps tendonitis on right    Bronchiectasis without complication (HCC) 03/16/2011   CT 2012 IMPRESSION:  1. Apical scarring may account for the plain film abnormality.  2. A 6 mm right upper lobe pulmonary nodule.  - - HRCT 07/12/2017 >>>  Scattered minimal cylindrical and varicoid bronchiectasis with associated scattered mucoid impaction and minimal tree-in-bud opacity in both lungs, predominantly in the mid to lower lungs. Findings are stable to slightly worsened since 2015 ches   Chicken pox    Chronic cough 05/18/2017   CT chest 03/19/14  No bronchiectasis (not hrct)  Spirometry 05/18/2017  FEV1  2.15 (93%)  Ratio 76 s rx prior  - FENO 05/18/2017  =   Could not perform   - Allergy  profile 05/18/2017 >  Eos 0.2 /  IgE  75 RAST pos cat > dog > mold - HRCT 07/12/2017 >>>  See bronchiectasis - trial of dymista  07/26/2017 > some better but not consistent with it >  rechallenge rx one puff each am 10/25/2017  - MCT 11/02/17 >     Colon polyp 03/31/2017   hyperplastic   COVID-19 08/04/2020   mild   Diverticulosis 03/2017   sigmoid   Family history of colon cancer 04/03/2017   Formatting of this note might be different from the original. 1/19 colon - no adenomas Recommend screening exam in 1/24 - [facility]   Family history of ovarian cancer    Genetic testing 03/26/2016   Negative genetic testing on the [facility] 30 gene panel.  The 30-Gene Cancer Panel offered by [facility] includes sequencing and/or deletion duplication testing of the following 30 genes: APC, ATM, BAP1, BARD1, BMPR1A, BRCA1, BRCA2, BRIP1, CDH1, CDK4, CDKN2A (p14ARF), CDKN2A (p16INK4a), CHEK2, EPCAM, GREM1, MITF, MLH1, MSH2, MSH6, MUTYH, NBN, PALB2, PMS2, POLD1, POLE, PTEN, RAD51C, RAD51D, SMAD4,    GERD 11/09/2006   H. pylori infection    h/o treated wiht prevpac   Hormone replacement therapy 01/08/2019   HYPERLIPIDEMIA 11/09/2006   Hypertension    Hypothyroidism 08/03/2017   LOC (loss of consciousness) (HCC) 01/26/2022   Lumbar radiculopathy 07/24/2014   Migraine    Mild depression 02/14/2019   Pharyngoesophageal dysphagia 08/03/2017   Piriformis syndrome of right side    Spinal stenosis, lumbar region, with neurogenic claudication 10/16/2014   Epidural 10/02/2014, December 2016  Stomach ulcer 2008   Past Surgical History:  Procedure Laterality Date   CATARACT EXTRACTION Bilateral 04/2021   CERVICAL POLYPECTOMY  01/17/2019   LIPOMA EXCISION     of back   TUBAL LIGATION     Patient Active Problem List   Diagnosis Date Noted   Adjustment reaction with anxiety and depression 01/14/2023   ELIQUIS -Acquired thrombophilia (HCC) 12/23/2022   Cerebrovascular accident (HCC) 07/06/2022   Atrial fibrillation (HCC) 07/06/2022   Obstructive sleep apnea on CPAP 01/27/2022   Bradycardia with 51-60 beats per minute 01/26/2022   Sleep disorder 12/17/2021   Rotator cuff tendinitis,  right 10/09/2021   Memory deficit MMSE 30/30 06/30/2021   H. pylori infection    Moderate aortic regurgitation 10/17/2019   Non-sustained ventricular tachycardia (HCC) 09/04/2019   Rheumatic aortic valve insufficiency 09/04/2019   Hormone replacement therapy 01/08/2019   Aortic atherosclerosis (HCC) 02/08/2018   Atherosclerosis of native coronary artery of native heart without angina pectoris 02/08/2018   Hypothyroidism 08/03/2017   Pharyngoesophageal dysphagia 08/03/2017   Chronic cough 05/18/2017   Spinal stenosis, lumbar region, with neurogenic claudication 10/16/2014   Lumbar radiculopathy 07/24/2014   Bronchiectasis without complication (HCC) 03/16/2011   Essential hypertension 12/21/2010   Hyperlipidemia 11/09/2006   GERD 11/09/2006   Stomach ulcer 2008    SPEECH THERAPY RENEWAL-DISCHARGE SUMMARY  Visits from Start of Care: 10  Current functional level related to goals / functional outcomes: Pt will be seen today with current LTGs enforced and will then be discharged. She reports she has had a decr in sx by 50-60% compared to prior to ST.    Remaining deficits: Sx of chronic cough, but sx have decr'd in severity and decr'd in frequency compared to prior to initiation of ST.    Education / Equipment: See individual therapy session notes.    Patient agrees to discharge. Patient goals were partially met. Patient is being discharged due to being pleased with the current functional level..     Onset date: probably 6-7 years ago; script dated 07/14/23  REFERRING DIAG: 05.3 (ICD-10-CM) - Chronic cough I63.89 (ICD-10-CM) - Cerebrovascular accident (CVA) due to other mechanism (HCC) R49.9 (ICD-10-CM) - Change in voice  THERAPY DIAG:  Other voice and resonance disorders  Expressive language impairment  Rationale for Evaluation and Treatment: Rehabilitation  SUBJECTIVE:   SUBJECTIVE STATEMENT: I haven't had any major coughing since last time.  Pt accompanied by:  self  PERTINENT HISTORY: Pt with CVA in 2024 and had short course of ST treating expressive communication when pt mentioned cough as well. SLP-Johnson addressed this in last few sessions with pt.  PAIN:  Are you having pain? No  FALLS: Has patient fallen in last 6 months? No,   PATIENT GOALS: Get rid of this cough.  OBJECTIVE:  Note: Objective measures were completed at Evaluation unless otherwise noted.  DIAGNOSTIC FINDINGS:  PCP - 07/14/23 Mild depression and worry: Patient reports she feels the Lexapro  10 mg has been helpful.  She is taking the trazodone  to help with sleep nightly. Prior note: pt reports she feels she is worrying more about her health and future. She endorses feeling down in the dumps. She is not sleeping well. Trazodone  helps some. She feels her bad days are now outweighing her good days.  She had been on effexor  a few years ago, mostly for MSK complaints, but she noticed a positive effect in her emotions on medication. She reports she had a hard time when she tried to come off  that medication.   Chronic cough/voice change: Working well Gabapentin  300 mg twice daily   HTN/a.fib/Mixed hyperlipidemia/aortic atherosclerosis/AR/MR/CAD-h/o stroke Pt reports compliance with losartan12.5 mg qd, amlodipine  5 mg qd, eliquis , lipitor and sectral   Patient denies chest pain, shortness of breath, dizziness or lower extremity edema.  Speech therapy referral placed   Established with cardio. RF: [email or web address] CAD.stroke 08/30/2020-echo: Moderate mitral regurgitation, mild to moderate aortic regurgitation, mild to moderate tricuspid regurgitation. The right-sided pressures - mildly elevated.   Hypothyroidism, unspecified type Compliant with levo 88 mcg qd.   PATIENT REPORTED OUTCOME MEASURES (PROM): VCD-Q provided and pt scored 40/60 with higher scores correlating with sx of VCD causing decr'd QOL.                                                                                                                             TREATMENT DATE:  Abdominal breathing=AB, Vocal Cord Dysfunction=VCD  09/20/23: It just seems to be going pretty well. She states that AB is the best thing to assist her in quelling s/sx of cough/throat clear during the day, but that visualization has also been successful for her. She now uses alginate therapy and has seen success with this as well. Her voice, notably, sounds better in quality today than in previous sessions. Sosha tells SLP that sx have decr'd at least 50-60% since prior to ST. Pt now only seldom if at all worries if she will begin coughing when talking with someone. She completed VCD-Q today and scored 45/60, higher than pre-tx however stated that her sx have decr'd at least 50% compared to pre-ST.  She used AB in conversation 95%+ for 15 minutes in ST room and also reported feeling AB outdoors in conversation.   09/06/23: Birdella tells SLP her symptoms have decr'd 40% since prior to ST. More episodes in last two weeks than between 2 sessions ago and the previous session. Pt stated she coughs within the first 5-10 minutes of lying down. SLP suggested she try alginate therapy and provided some websites for her to look at and possibly try.  15 minutes of conversation in therapy room - pt used AB.  Outdoors for 5 minutes pt reported she used AB. She would prefer one more session to ensure success over time, and is generally pleased with progress.  08/23/23: Pernell reports not having any attacks since last session but some episodes that were abated by her using the strategies and techniques she has learned from SLP. Today she used AB in 25 minutes of conversation in and outside of the ST room and also outdoors. She and SLP agree she has progressed to being able to attend ST every other week. Possible d/c in 1-2 more visits.  08/17/23: Pt had 5 minute coughing episode at pilates this week. Took sips of water and got a lozenge and it was  over in 5 minutes or less. SLP, given pt's S, making a 3x5 card with her new tools to combat  VCD sx written on it, and use it. Today pt used AB in conversation with SLP in the room, and for 10 minutes outdoors. If pt's status remains unchanged next session she may reduce frequency to once every other week.   08/10/23: Pt indicates success this morning with coughing episode for about 6 minutes which pt states she reduced the severity and the length of the episode by using techniques for quelling s/sx chronic cough and VCD. States both s/sx VCD and cough have been reduced to less than half compared to prior to ST. No coughs nor throat clears today. SLP and pt talked in 15 minutes conversation and pt maintained AB at least 95% of the time both in and out doors. Pt and SLP agreed she could reduce to once/week.   08/08/23: Addressed employments of VCD management strategies while in prone position d/t pt rpeorting coughing episodes frequently occursing while lying down (pilates and laying for nap). SLP reviewed strategies with occasional demonstration provided. Pt ID x3 preferred strateiges: visualization, sniff sniff sh, and soft cough. Pt demonstrates use of strategies with mod-I. SLP moved pt to room with mat, pt remained in prone position during conversation for 15 minutes with no coughing evidenced. Upon returning to sitting, pt with cough response with stated  I couldn't stop it. SLP cues for sniff sniff blow to reduced duration and intensity with pt successfully demonstrating. Discussed setting 2 minute timer to practice rescue breathing techniques to A in ability to carryover when VCD attack occurs. Discussed challenges in re-wiring to reduced VCD instances and intensity with pt verbalizing appreciation. Encouraged pt to keep practicing despite not being 100%.   08/04/23: SLP reviewed all strategies (physiological and psychological) for pt to use to quell/eliminate s/sx VCD and chronic cough. Pt stated  she has been feeling AB throughout the week but not necessarily during conversation - is questioning whether she uses AB during conversation or not. SLP targeted AB in conversation with pt today. First 6 minutes with SLP asking pt short answer related wh questions, pt with AB noted by SLP 90%. Afterwards, SLP engaged pt in conversation for 10 minutes about salient and interesting topics for pt - her success with AB was 85% today. Consider decr'ing pt's frequency to once/week.  07/27/23: Pt told SLP 3 wyas to compensate for s/sx VCD. Pt filled out VCD-Q today with above results. She used AB for 3 minutes with excellent success. She req'd min A rarely for using AB with simple wh questions and yes/no with AB 90% success. SLP told pt to set alarm on her watch for a time she knows she will talk, and try to talk with AB for 10 minutes after the alarm.  07/26/23: NEEDS PROM NEXT SESSION. Discussed with pt her coughing spell occurring at Pilates almost every session. SLP asked and learned pt is horizontal and ?s role of GERD on pt's cough. Suggested she try alginate therapy and provided web addresses for [employer] and [employer]. SLP noted pt clearing throat 6 times and cough 2 times in first 15 minutes. SLP introduced some alternatives to throat clearing. SLP and pt reviewed these and SLP provided examples and pt then imitated SLP correctly.  Lastly, SLP taught pt how to produce AB. She was 90% successful at rest over 2 minutes. SLP told pt to practice AB 15 minutes BID.  07/20/23: SLP and pt discussed compensations for s/sx of VCD and chronic cough including visualization, and AB. SLP discussed s/sx of VCD and how  AB decreases frequency of s/sx VCD and chronic cough. Pt generated her scene of relaxation which included a childhood green space with a small pond and a sitting area.   PATIENT EDUCATION: Education details: see treatment date Person educated: Patient Education method: Explanation,  Demonstration, Verbal cues, and Handouts Education comprehension: verbalized understanding, returned demonstration, verbal cues required, and needs further education  HOME EXERCISE PROGRAM: VCD techniques, and AB  GOALS: Goals reviewed with patient? Yes  SHORT TERM GOALS: Target date: 08/19/23   Pt will complete PROM Baseline: Goal status: met   2.  Pt will demo 2 compensations she can use for s/sx VCD in 2 sessions Baseline: 07/27/23 Goal status: met   3.  Pt will report using one compensation (visualization, sniff sniff blow, pursed lip breathing, etc) successfully to decr severity of VCD between 2 sessions Baseline: 08/04/23 Goal status: met   4.  Pt will demo AB in sentence responses 80% of the time in two sessions Baseline: 08/04/23 Goal status: partially met     LONG TERM GOALS: Target date: 09/16/23   Pt will improve PROM compared to initial administration Baseline:  Goal status: not met, however pt stated sx have decr'd 50-60%   2.  Pt will report using one compensation (visualization, sniff sniff blow, pursed lip breathing, etc) successfully to decr severity of VCD between 3 sessions, after 07/29/23 Baseline: 08/10/23, 08/23/23, 09/06/23 Goal status: met   3.  Pt will demo AB 80% of the time in 10 minutes conversation in three sessions Baseline: 08/10/23, 08/23/23 Goal status: met   4.  Pt will decr coughing and throat clearing to 1 instance, in three sessions Baseline: 08/10/23,08/23/23 Goal status: met     ASSESSMENT:   CLINICAL IMPRESSION: Patient is a 78 y.o. F who was seen today for treatment of voice function in light of chronic cough dx, and reported sx not unlike those of pt with VCD. See treatment date above for today's date for further details on today's session. Peighton stated on day of eval that she is hindered in communication both by fear of coughing while in conversation as she tells SLP that I'm afraid it might happen when I'm talking with someone. She  also mentioned she feels hindered in communication in that talking generates the coughing. Today she only rarely if at all has that thought when speaking with someone. She and SLP agreed today that d/c is appropriate for pt.    OBJECTIVE IMPAIRMENTS: include voice disorder. These impairments are limiting patient from household responsibilities, ADLs/IADLs, and effectively communicating at home and in community. Factors affecting potential to achieve goals and functional outcome are  none noted .SABRA Patient will benefit from skilled SLP services to address above impairments and improve overall function.   REHAB POTENTIAL: Excellent   PLAN:  discharge today   PLANNED INTERVENTIONS: Internal/external aids, Functional tasks, SLP instruction and feedback, Compensatory strategies, Patient/family education, and [id number] Treatment of speech (30 or 45 min)    Dedra Matsuo, CCC-SLP 09/20/2023, 11:53 AM   REFERRING DIAG: 05.3 (ICD-10-CM) - Chronic cough I63.89 (ICD-10-CM) - Cerebrovascular accident (CVA) due to other mechanism (HCC) R49.9 (ICD-10-CM) - Change in voice  THERAPY DIAG:  Other voice and resonance disorders  What was this (referring dx) caused by? []  Surgery []  Fall []  Ongoing issue []  Arthritis []  Other: ____________  Laterality: []  Rt []  Lt []  Both  Check all possible CPT codes:  *CHOOSE 10 OR LESS*    See Planned Interventions listed in the Plan  section of the Evaluation.

## 2023-10-04 ENCOUNTER — Ambulatory Visit

## 2023-10-11 ENCOUNTER — Ambulatory Visit: Payer: Medicare PPO | Admitting: Neurology

## 2023-10-11 ENCOUNTER — Encounter: Payer: Self-pay | Admitting: Neurology

## 2023-10-11 ENCOUNTER — Telehealth: Payer: Self-pay

## 2023-10-11 VITALS — BP 130/72 | HR 78 | Resp 16 | Ht 65.0 in | Wt 126.1 lb

## 2023-10-11 DIAGNOSIS — G4733 Obstructive sleep apnea (adult) (pediatric): Secondary | ICD-10-CM | POA: Diagnosis not present

## 2023-10-11 DIAGNOSIS — I6389 Other cerebral infarction: Secondary | ICD-10-CM

## 2023-10-11 DIAGNOSIS — I4821 Permanent atrial fibrillation: Secondary | ICD-10-CM | POA: Diagnosis not present

## 2023-10-11 NOTE — Telephone Encounter (Signed)
-----   Message from Lauraine JINNY Born sent at 10/11/2023  1:50 PM EDT ----- Order to DME, I already changed pressure range in res med.
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
## 2023-10-11 NOTE — Patient Instructions (Signed)
 I will increase your pressure range from 5-10 to 5-12, recommend mask refit since your mask is leaking. I will pull download in 6 weeks and contact you. Continue follow up with primary care Strict management of vascular risk factors with a goal BP less than 130/90, A1c less than 7.0, LDL less than 70 for secondary stroke prevention

## 2023-10-11 NOTE — Telephone Encounter (Signed)
 Cpap order sent to dme thru epic:

## 2023-11-03 DIAGNOSIS — G4733 Obstructive sleep apnea (adult) (pediatric): Secondary | ICD-10-CM | POA: Diagnosis not present

## 2023-11-10 ENCOUNTER — Encounter: Payer: Self-pay | Admitting: Family Medicine

## 2023-11-22 ENCOUNTER — Encounter: Payer: Self-pay | Admitting: Neurology

## 2023-12-14 ENCOUNTER — Encounter: Payer: Self-pay | Admitting: Family Medicine

## 2023-12-14 MED ORDER — COVID-19 MRNA VAC-TRIS(PFIZER) 30 MCG/0.3ML IM SUSY: 0.3000 mL | PREFILLED_SYRINGE | Freq: Once | INTRAMUSCULAR | 0 refills | Status: AC

## 2024-01-05 ENCOUNTER — Encounter: Payer: Self-pay | Admitting: Family Medicine

## 2024-01-05 ENCOUNTER — Ambulatory Visit: Admitting: Family Medicine

## 2024-01-05 VITALS — BP 110/66 | HR 71 | Temp 97.2°F | Wt 128.8 lb

## 2024-01-05 DIAGNOSIS — I10 Essential (primary) hypertension: Secondary | ICD-10-CM | POA: Diagnosis not present

## 2024-01-05 DIAGNOSIS — I700 Atherosclerosis of aorta: Secondary | ICD-10-CM | POA: Diagnosis not present

## 2024-01-05 DIAGNOSIS — R053 Chronic cough: Secondary | ICD-10-CM

## 2024-01-05 DIAGNOSIS — I4821 Permanent atrial fibrillation: Secondary | ICD-10-CM | POA: Diagnosis not present

## 2024-01-05 DIAGNOSIS — I351 Nonrheumatic aortic (valve) insufficiency: Secondary | ICD-10-CM

## 2024-01-05 DIAGNOSIS — I4729 Other ventricular tachycardia: Secondary | ICD-10-CM

## 2024-01-05 DIAGNOSIS — F4323 Adjustment disorder with mixed anxiety and depressed mood: Secondary | ICD-10-CM | POA: Diagnosis not present

## 2024-01-05 DIAGNOSIS — E034 Atrophy of thyroid (acquired): Secondary | ICD-10-CM | POA: Diagnosis not present

## 2024-01-05 DIAGNOSIS — I2510 Atherosclerotic heart disease of native coronary artery without angina pectoris: Secondary | ICD-10-CM | POA: Diagnosis not present

## 2024-01-05 DIAGNOSIS — G4733 Obstructive sleep apnea (adult) (pediatric): Secondary | ICD-10-CM

## 2024-01-05 DIAGNOSIS — K219 Gastro-esophageal reflux disease without esophagitis: Secondary | ICD-10-CM | POA: Diagnosis not present

## 2024-01-05 DIAGNOSIS — G479 Sleep disorder, unspecified: Secondary | ICD-10-CM

## 2024-01-05 DIAGNOSIS — D6869 Other thrombophilia: Secondary | ICD-10-CM | POA: Diagnosis not present

## 2024-01-05 DIAGNOSIS — E782 Mixed hyperlipidemia: Secondary | ICD-10-CM

## 2024-01-05 LAB — BASIC METABOLIC PANEL WITH GFR
BUN: 17 mg/dL (ref 6–23)
CO2: 34 meq/L — ABNORMAL HIGH (ref 19–32)
Calcium: 9.3 mg/dL (ref 8.4–10.5)
Chloride: 102 meq/L (ref 96–112)
Creatinine, Ser: 1.03 mg/dL (ref 0.40–1.20)
GFR: 52.56 mL/min — ABNORMAL LOW (ref >60.00)
Glucose, Bld: 97 mg/dL (ref 70–99)
Potassium: 4.4 meq/L (ref 3.5–5.1)
Sodium: 142 meq/L (ref 135–145)

## 2024-01-05 LAB — CBC
HCT: 42.4 % (ref 36.0–46.0)
Hemoglobin: 13.7 g/dL (ref 12.0–15.0)
MCHC: 32.4 g/dL (ref 30.0–36.0)
MCV: 89.6 fl (ref 78.0–100.0)
Platelets: 243 K/uL (ref 150.0–400.0)
RBC: 4.73 Mil/uL (ref 3.87–5.11)
RDW: 13.3 % (ref 11.5–15.5)
WBC: 7.1 K/uL (ref 4.0–10.5)

## 2024-01-05 MED ORDER — TRAZODONE HCL 50 MG PO TABS: 25.0000 mg | ORAL_TABLET | Freq: Every evening | ORAL | 1 refills | Status: AC | PRN

## 2024-01-05 MED ORDER — ESCITALOPRAM OXALATE 10 MG PO TABS: 10.0000 mg | ORAL_TABLET | Freq: Every day | ORAL | 1 refills | Status: AC

## 2024-01-05 MED ORDER — GABAPENTIN 300 MG PO CAPS: 300.0000 mg | ORAL_CAPSULE | Freq: Two times a day (BID) | ORAL | 1 refills | Status: AC

## 2024-01-05 NOTE — Patient Instructions (Addendum)

## 2024-01-05 NOTE — Progress Notes (Signed)
 Patient ID: Yvette Short, female  DOB: 1946/02/25, 78 y.o.   MRN: [id number] Patient Care Team    Relationship Specialty Notifications Start End  Catherine Charlies LABOR, DO PCP - General Family Medicine  08/03/17   Mat Browning, MD Consulting Physician Obstetrics and Gynecology  08/03/17   Darlean Ozell NOVAK, MD Consulting Physician Pulmonary Disease  08/03/17   Claudene Arthea HERO, DO Consulting Physician Sports Medicine  08/03/17   Arloa Alyce HERO, MD Referring Physician Gastroenterology  08/03/17   Cleotilde Sewer, OD  Optometry  08/03/17   Sheena Pugh, DO Consulting Physician Cardiology  06/30/21     Chief Complaint  Patient presents with   Medical Management of Chronic Issues    Subjective: Yvette Short is a 78 y.o.  Female  present for chronic condition management All past medical history, surgical history, allergies, family history, immunizations, medications and social history were updated in the electronic medical record today. All recent labs, ED visits and hospitalizations within the last year were reviewed.  Mild depression and worry: Patient reports she feels the Lexapro  10 mg is helping  She is taking the trazodone  25-50 to help with sleep nightly, and it works well for her Prior note: pt reports she feels she is worrying more about her health and future. She endorses feeling down in the dumps. She is not sleeping well. Trazodone  helps some. She feels her bad days are now outweighing her good days.  She had been on effexor  a few years ago, mostly for MSK complaints, but she noticed a positive effect in her emotions on medication. She reports she had a hard time when she tried to come off that medication.  Chronic cough/voice change: Compliant with Gabapentin  300 mg twice daily and it is helpful.    HTN/a.fib/Mixed hyperlipidemia/aortic atherosclerosis/AR/MR/CAD-h/o stroke Pt reports compliance with losartan12.5 mg qd, amlodipine  5 mg qd, eliquis , lipitor and sectral   Patient  denies chest pain, shortness of breath, dizziness or lower extremity edema.  Established with cardio. RF: [id number].[id number] CAD.stroke 08/30/2020-echo: Moderate mitral regurgitation, mild to moderate aortic regurgitation, mild to moderate tricuspid regurgitation. The right-sided pressures - mildly elevated.   Hypothyroidism, unspecified type compliant with levo 88 mcg qd. No complaints     08/31/2023    2:57 PM 01/14/2023   10:14 AM 12/23/2022    8:24 AM 08/25/2022    3:24 PM 07/06/2022    8:20 AM  Depression screen PHQ 2/9  Decreased Interest 0 1 0 0 0  Down, Depressed, Hopeless 0 1 1 0 0  PHQ - 2 Score 0 2 1 0 0  Altered sleeping 0 2     Tired, decreased energy 1 2     Change in appetite 0 1     Feeling bad or failure about yourself  0 1     Trouble concentrating 0 3     Moving slowly or fidgety/restless 0 0     Suicidal thoughts 0 0     PHQ-9 Score 1 11     Difficult doing work/chores Not difficult at all Somewhat difficult         01/14/2023   10:15 AM 06/26/2020    8:48 AM 12/25/2019    8:46 AM  GAD 7 : Generalized Anxiety Score  Nervous, Anxious, on Edge 2 1 0  Control/stop worrying 1 0 0  Worry too much - different things 1 0 0  Trouble relaxing 1 0 0  Restless 0 0 0  Easily  annoyed or irritable 1 1 1   Afraid - awful might happen 1 0 0  Total GAD 7 Score 7 2 1   Anxiety Difficulty Somewhat difficult        07/06/2022    8:19 AM 08/25/2022    3:26 PM 12/23/2022    8:23 AM 01/14/2023   10:14 AM 08/31/2023    2:51 PM  Fall Risk  Falls in the past year? 0 0 0 0 0  Was there an injury with Fall? 0 0 0 0 0  Fall Risk Category Calculator 0 0 0 0 0  Patient at Risk for Falls Due to  Impaired vision;Impaired balance/gait No Fall Risks No Fall Risks   Fall risk Follow up  Falls prevention discussed Falls evaluation completed Falls evaluation completed Falls evaluation completed;Education provided;Falls prevention discussed     Immunization History  Administered Date(s)  Administered   Fluad Quad(high Dose 65+) 12/25/2019, 11/30/2021   INFLUENZA, HIGH DOSE SEASONAL PF 01/03/2013, 12/26/2013, 02/12/2016, 12/28/2016, 12/05/2020, 12/01/2022, 12/06/2023   Influenza Split 12/21/2010, 12/21/2011   Influenza Whole 01/04/2008, 12/31/2008, 01/14/2010   Influenza-Unspecified 12/06/2017, 12/25/2019   Novel Infuenza-h1n1-09 03/14/2008   PFIZER Comirnaty(Gray Top)Covid-19 Tri-Sucrose Vaccine 12/23/2021   PFIZER(Purple Top)SARS-COV-2 Vaccination 04/22/2019, 05/14/2019, 12/25/2019, 01/25/2020   Pfizer Covid-19 Vaccine Bivalent Booster 53yrs & up 12/05/2020   Pfizer(Comirnaty)Fall Seasonal Vaccine 12 years and older 12/23/2021, 12/01/2022, 12/27/2023   Pneumococcal Conjugate-13 02/16/2016   Pneumococcal Polysaccharide-23 04/10/2012   Respiratory Syncytial Virus Vaccine,Recomb Aduvanted(Arexvy) 12/23/2021   Td 03/09/2001, 12/10/2008   Tdap 12/10/2008, 12/28/2016   Zoster Recombinant(Shingrix ) 08/03/2017, 12/29/2017, 12/29/2017   Zoster, Live 12/21/2010     Past Medical History:  Diagnosis Date   Abnormal colonoscopy 03/2017   decreased rectal tone and polyp; 5 year recall   Abnormal findings on esophagogastroduodenoscopy (EGD) 02/25/2016   Z-line irrg. at 40 cm. No abnomrality of the esophagus to explain dysphagia. Esophagus dilated. H/o + H. Pylori   Allergy     ANXIETY 07/06/2007   Aortic atherosclerosis 02/08/2018   Aortic valve regurgitation    Atherosclerosis of native coronary artery of native heart without angina pectoris 02/08/2018   Biceps tendonitis on right    Bronchiectasis without complication (HCC) 03/16/2011   CT 2012  stable to slightly worsened. CT 2015> no bronchiectasis   Chicken pox    Chronic cough 05/18/2017   CT chest 03/19/14  No bronchiectasis (not hrct)  Spirometry 05/18/2017  FEV1  2.15 (93%)  Ratio 76 s rx prior  - FENO 05/18/2017  =   Could not perform   - Allergy  profile 05/18/2017 >  Eos 0.2 /  IgE  75 RAST pos cat > dog > mold - HRCT  07/12/2017 >>>  See bronchiectasis - trial of dymista  07/26/2017 > some better but not consistent with it > rechallenge rx one puff each am 10/25/2017  - MCT 11/02/17 >     Colon polyp 03/31/2017   hyperplastic   COVID-19 08/04/2020   mild   Diverticulosis 03/2017   sigmoid   Family history of colon cancer 04/03/2017   Formatting of this note might be different from the original. 1/19 colon - no adenomas Recommend screening exam in 1/24 - [facility]   Family history of ovarian cancer    Genetic testing 03/26/2016   Negative genetic testing on the [facility] 30 gene panel.  The 30-Gene Cancer Panel offered by [facility] includes sequencing and/or deletion duplication testing of the following 30 genes: APC, ATM, BAP1, BARD1, BMPR1A, BRCA1, BRCA2, BRIP1, CDH1,  CDK4, CDKN2A (p14ARF), CDKN2A (p16INK4a), CHEK2, EPCAM, GREM1, MITF, MLH1, MSH2, MSH6, MUTYH, NBN, PALB2, PMS2, POLD1, POLE, PTEN, RAD51C, RAD51D, SMAD4,    GERD 11/09/2006   H. pylori infection    h/o treated wiht prevpac   Hormone replacement therapy 01/08/2019   HYPERLIPIDEMIA 11/09/2006   Hypertension    Hypothyroidism 08/03/2017   LOC (loss of consciousness) (HCC) 01/26/2022   Lumbar radiculopathy 07/24/2014   Migraine    Mild depression 02/14/2019   Pharyngoesophageal dysphagia 08/03/2017   Piriformis syndrome of right side    Spinal stenosis, lumbar region, with neurogenic claudication 10/16/2014   Epidural 10/02/2014, December 2016    Stomach ulcer 2008   Allergies  Allergen Reactions   Tetracycline Hcl Rash        Past Surgical History:  Procedure Laterality Date   CATARACT EXTRACTION Bilateral 04/2021   CERVICAL POLYPECTOMY  01/17/2019   LIPOMA EXCISION     of back   TUBAL LIGATION     Family History  Problem Relation Age of Onset   Dementia Mother    Hyperlipidemia Mother    Colon cancer Father 40   Colon polyps Brother        gets colonoscopy every 2-3 years   Hyperlipidemia Brother    Hypertension Brother     Uterine cancer Maternal Aunt        dx in her 15s   Colon cancer Maternal Uncle    Colon cancer Paternal Aunt    Heart attack Maternal Grandfather    Cancer Other        MGMs sister - abdominal cancer   Breast cancer Cousin    Social History   Social History Narrative   Married. Two children.    College grad.    Former smoker.    Exercises routinely.    Drink caffeine.    Wears a hearing aid.    Smoke alarm in the home.    Wears her seatbelt.    Feels safe in her relationships.     Allergies as of 01/05/2024       Reactions   Tetracycline Hcl Rash            Medication List        Accurate as of January 05, 2024  8:47 AM. If you have any questions, ask your nurse or doctor.          acebutolol  200 MG capsule Commonly known as: SECTRAL  Take 1 capsule (200 mg total) by mouth daily.   amLODipine  5 MG tablet Commonly known as: NORVASC  Take 1 tablet (5 mg total) by mouth daily.   apixaban  5 MG Tabs tablet Commonly known as: Eliquis  Take 1 tablet (5 mg total) by mouth 2 (two) times daily.   atorvastatin  40 MG tablet Commonly known as: LIPITOR Take 1 tablet (40 mg total) by mouth daily.   Azelastine  HCl 137 MCG/SPRAY Soln Place 1 spray into both nostrils in the morning and at bedtime.   chlorpheniramine 4 MG tablet Commonly known as: CHLOR-TRIMETON Take 4 mg by mouth every 4 (four) hours as needed for allergies.   DIALYVITE VITAMIN D  5000 PO Take by mouth.   escitalopram  10 MG tablet Commonly known as: Lexapro  Take 1 tablet (10 mg total) by mouth daily.   estradiol 0.0375 MG/24HR Commonly known as: VIVELLE-DOT Place 1 patch onto the skin 2 (two) times a week.   fluocinonide cream 0.05 % Commonly known as: LIDEX APPLY AS DIRECTED TO AFFECTED AREA   gabapentin   300 MG capsule Commonly known as: NEURONTIN  Take 1 capsule (300 mg total) by mouth 2 (two) times daily.   levothyroxine  88 MCG tablet Commonly known as: SYNTHROID  Take 1 tablet (88  mcg total) by mouth daily.   losartan  25 MG tablet Commonly known as: COZAAR  Take 0.5 tablets (12.5 mg total) by mouth daily.   MAGNESIUM PO Take by mouth.   progesterone 100 MG capsule Commonly known as: PROMETRIUM Take 100 mg by mouth in the morning and at bedtime. TAKING DAILY   traZODone  50 MG tablet Commonly known as: DESYREL  Take 0.5-1 tablets (25-50 mg total) by mouth at bedtime as needed for sleep. Take half tablet by mouth as needed at bedtime   VITAMIN K PO Take by mouth.        All past medical history, surgical history, allergies, family history, immunizations andmedications were updated in the EMR today and reviewed under the history and medication portions of their EMR.      Review of Systems  All other systems reviewed and are negative.  14 pt review of systems performed and negative (unless mentioned in an HPI)  Objective: BP 110/66   Pulse 71   Temp (!) 97.2 F (36.2 C)   Wt 128 lb 12.8 oz (58.4 kg)   SpO2 97%   BMI 21.43 kg/m  Physical Exam Vitals and nursing note reviewed.  Constitutional:      General: She is not in acute distress.    Appearance: Normal appearance. She is not ill-appearing, toxic-appearing or diaphoretic.  HENT:     Head: Normocephalic and atraumatic.  Eyes:     General: No scleral icterus.       Right eye: No discharge.        Left eye: No discharge.     Extraocular Movements: Extraocular movements intact.     Conjunctiva/sclera: Conjunctivae normal.     Pupils: Pupils are equal, round, and reactive to light.  Cardiovascular:     Rate and Rhythm: Normal rate and regular rhythm.     Heart sounds: No murmur heard. Pulmonary:     Effort: Pulmonary effort is normal. No respiratory distress.     Breath sounds: Normal breath sounds. No wheezing, rhonchi or rales.  Musculoskeletal:     Cervical back: Neck supple.     Right lower leg: No edema.     Left lower leg: No edema.  Skin:    General: Skin is warm.     Findings:  No rash.  Neurological:     Mental Status: She is alert and oriented to person, place, and time. Mental status is at baseline.     Motor: No weakness.     Gait: Gait normal.  Psychiatric:        Mood and Affect: Mood normal.        Behavior: Behavior normal.        Thought Content: Thought content normal.        Judgment: Judgment normal.      No results found.  Assessment/plan: Yvette Short is a 78 y.o. female present for Chronic Conditions/illness Management Adjustment reaction with anxiety and depression Stable Continue lexapro  10 mg every day.   HTN/a.fib/Mixed hyperlipidemia/aortic atherosclerosis/AR/MR/CAD-h/o stroke/aquired thrombophilia-eliquis /Atherosclerosis of native coronary artery of native heart without angina pectoris/SVT Stable Continue amlodipine  5 mg daily, losartan  12.5 mg daily,Lipitor 40 nightly, sectral  and eliquis  prescribed by cardiology CBC and BMP collected today  Sleep disorder/OSA: Had been struggling with CPAP use, but compliant Continue trazodone   25-50 mg qd.   Hypothyroidism due to acquired atrophy of thyroid  Continue levothyroxine  88 mcg daily Labs up-to-date 06/2023  Chronic cough/GERD Continue gabapentin  300 mg BID Pt reports no sedation affects     Return in about 6 months (around 07/16/2024) for cpe (20 min), Routine chronic condition follow-up.   Orders Placed This Encounter  Procedures   CBC   Basic Metabolic Panel (BMET)   Meds ordered this encounter  Medications   escitalopram  (LEXAPRO ) 10 MG tablet    Sig: Take 1 tablet (10 mg total) by mouth daily.    Dispense:  90 tablet    Refill:  1   gabapentin  (NEURONTIN ) 300 MG capsule    Sig: Take 1 capsule (300 mg total) by mouth 2 (two) times daily.    Dispense:  180 capsule    Refill:  1   traZODone  (DESYREL ) 50 MG tablet    Sig: Take 0.5-1 tablets (25-50 mg total) by mouth at bedtime as needed for sleep. Take half tablet by mouth as needed at bedtime    Dispense:   90 tablet    Refill:  1   Referral Orders  No referral(s) requested today      Electronically signed by: Charlies Bellini, DO [facility] [place]

## 2024-01-08 ENCOUNTER — Encounter: Payer: Self-pay | Admitting: Family Medicine

## 2024-01-08 ENCOUNTER — Ambulatory Visit: Payer: Self-pay | Admitting: Family Medicine

## 2024-05-03 ENCOUNTER — Ambulatory Visit: Admitting: Cardiology

## 2024-05-03 ENCOUNTER — Encounter: Payer: Self-pay | Admitting: Cardiology

## 2024-05-03 VITALS — BP 122/78 | HR 57 | Ht 65.0 in | Wt 128.4 lb

## 2024-05-03 DIAGNOSIS — I2510 Atherosclerotic heart disease of native coronary artery without angina pectoris: Secondary | ICD-10-CM

## 2024-05-03 DIAGNOSIS — I10 Essential (primary) hypertension: Secondary | ICD-10-CM | POA: Diagnosis not present

## 2024-05-03 DIAGNOSIS — E782 Mixed hyperlipidemia: Secondary | ICD-10-CM

## 2024-05-03 DIAGNOSIS — I340 Nonrheumatic mitral (valve) insufficiency: Secondary | ICD-10-CM

## 2024-05-03 DIAGNOSIS — I491 Atrial premature depolarization: Secondary | ICD-10-CM

## 2024-05-03 DIAGNOSIS — I480 Paroxysmal atrial fibrillation: Secondary | ICD-10-CM

## 2024-05-03 NOTE — Patient Instructions (Signed)
 Medication Instructions:  Your physician recommends that you continue on your current medications as directed. Please refer to the Current Medication list given to you today.  *If you need a refill on your cardiac medications before your next appointment, please call your pharmacy*   Testing/Procedures: Your physician has requested that you have an echocardiogram. Echocardiography is a painless test that uses sound waves to create images of your heart. It provides your doctor with information about the size and shape of your heart and how well your hearts chambers and valves are working. This procedure takes approximately one hour. There are no restrictions for this procedure. Please do NOT wear cologne, perfume, aftershave, or lotions (deodorant is allowed). Please arrive 15 minutes prior to your appointment time.  Please note: We ask at that you not bring children with you during ultrasound (echo/ vascular) testing. Due to room size and safety concerns, children are not allowed in the ultrasound rooms during exams. Our front office staff cannot provide observation of children in our lobby area while testing is being conducted. An adult accompanying a patient to their appointment will only be allowed in the ultrasound room at the discretion of the ultrasound technician under special circumstances. We apologize for any inconvenience.   Follow-Up: At [facility], you and your health needs are our priority.  As part of our continuing mission to provide you with exceptional heart care, our providers are all part of one team.  This team includes your primary Cardiologist (physician) and Advanced Practice Providers or APPs (Physician Assistants and Nurse Practitioners) who all work together to provide you with the care you need, when you need it.  Your next appointment:   6 month(s) call in August   Provider:   Kardie Tobb, DO     Other Instructions:

## 2024-05-03 NOTE — Progress Notes (Signed)
 " Cardiology Office Note:    Date:  05/03/2024   ID:  Yvette Short, DOB Apr 22, 1945, MRN [id number]  PCP:  Catherine Charlies LABOR, DO  Cardiologist:  Kiely Cousar, DO  Electrophysiologist:  None   Referring MD: Catherine Charlies LABOR, DO    I am ok  History of Present Illness:    Yvette Short is a 79 y.o. female with a hx of paroxysmal atrial fibrillation on Eliquis  now, hypertension, hyperlipidemia, hypothyroidism, moderate coronary artery disease by coronary CTA, frequent PACs and PVCs, moderate mitral regurgitation on recent echocardiogram presents today for follow-up visit.   She offers no significant complaints at this time.  But she had a lot of questions about her current cardiovascular conditions.  Past Medical History:  Diagnosis Date   Abnormal colonoscopy 03/2017   decreased rectal tone and polyp; 5 year recall   Abnormal findings on esophagogastroduodenoscopy (EGD) 02/25/2016   Z-line irrg. at 40 cm. No abnomrality of the esophagus to explain dysphagia. Esophagus dilated. H/o + H. Pylori   Allergy     ANXIETY 07/06/2007   Aortic atherosclerosis 02/08/2018   Aortic valve regurgitation    Atherosclerosis of native coronary artery of native heart without angina pectoris 02/08/2018   Biceps tendonitis on right    Bronchiectasis without complication (HCC) 03/16/2011   CT 2012  stable to slightly worsened. CT 2015> no bronchiectasis   Chicken pox    Chronic cough 05/18/2017   CT chest 03/19/14  No bronchiectasis (not hrct)  Spirometry 05/18/2017  FEV1  2.15 (93%)  Ratio 76 s rx prior  - FENO 05/18/2017  =   Could not perform   - Allergy  profile 05/18/2017 >  Eos 0.2 /  IgE  75 RAST pos cat > dog > mold - HRCT 07/12/2017 >>>  See bronchiectasis - trial of dymista  07/26/2017 > some better but not consistent with it > rechallenge rx one puff each am 10/25/2017  - MCT 11/02/17 >     Colon polyp 03/31/2017   hyperplastic   COVID-19 08/04/2020   mild   Diverticulosis 03/2017   sigmoid    Family history of colon cancer 04/03/2017   Formatting of this note might be different from the original. 1/19 colon - no adenomas Recommend screening exam in 1/24 - [facility]   Family history of ovarian cancer    Genetic testing 03/26/2016   Negative genetic testing on the [facility] 30 gene panel.  The 30-Gene Cancer Panel offered by [facility] includes sequencing and/or deletion duplication testing of the following 30 genes: APC, ATM, BAP1, BARD1, BMPR1A, BRCA1, BRCA2, BRIP1, CDH1, CDK4, CDKN2A (p14ARF), CDKN2A (p16INK4a), CHEK2, EPCAM, GREM1, MITF, MLH1, MSH2, MSH6, MUTYH, NBN, PALB2, PMS2, POLD1, POLE, PTEN, RAD51C, RAD51D, SMAD4,    GERD 11/09/2006   H. pylori infection    h/o treated wiht prevpac   Hormone replacement therapy 01/08/2019   HYPERLIPIDEMIA 11/09/2006   Hypertension    Hypothyroidism 08/03/2017   LOC (loss of consciousness) (HCC) 01/26/2022   Lumbar radiculopathy 07/24/2014   Migraine    Mild depression 02/14/2019   Pharyngoesophageal dysphagia 08/03/2017   Piriformis syndrome of right side    Spinal stenosis, lumbar region, with neurogenic claudication 10/16/2014   Epidural 10/02/2014, December 2016    Stomach ulcer 2008    Past Surgical History:  Procedure Laterality Date   CATARACT EXTRACTION Bilateral 04/2021   CERVICAL POLYPECTOMY  01/17/2019   LIPOMA EXCISION     of back   TUBAL LIGATION  Current Medications: Current Meds  Medication Sig   acebutolol  (SECTRAL ) 200 MG capsule Take 1 capsule (200 mg total) by mouth daily.   amLODipine  (NORVASC ) 5 MG tablet Take 1 tablet (5 mg total) by mouth daily.   apixaban  (ELIQUIS ) 5 MG TABS tablet Take 1 tablet (5 mg total) by mouth 2 (two) times daily.   atorvastatin  (LIPITOR) 40 MG tablet Take 1 tablet (40 mg total) by mouth daily.   Azelastine  HCl 137 MCG/SPRAY SOLN Place 1 spray into both nostrils in the morning and at bedtime.   chlorpheniramine (CHLOR-TRIMETON) 4 MG tablet Take 4 mg by mouth every 4  (four) hours as needed for allergies.   Cholecalciferol (DIALYVITE VITAMIN D  5000 PO) Take by mouth.   escitalopram  (LEXAPRO ) 10 MG tablet Take 1 tablet (10 mg total) by mouth daily.   estradiol (VIVELLE-DOT) 0.0375 MG/24HR Place 1 patch onto the skin 2 (two) times a week.   fluocinonide cream (LIDEX) 0.05 % APPLY AS DIRECTED TO AFFECTED AREA   gabapentin  (NEURONTIN ) 300 MG capsule Take 1 capsule (300 mg total) by mouth 2 (two) times daily.   levothyroxine  (SYNTHROID ) 88 MCG tablet Take 1 tablet (88 mcg total) by mouth daily.   losartan  (COZAAR ) 25 MG tablet Take 0.5 tablets (12.5 mg total) by mouth daily.   MAGNESIUM PO Take by mouth.   progesterone (PROMETRIUM) 100 MG capsule Take 100 mg by mouth in the morning and at bedtime. TAKING DAILY   traZODone  (DESYREL ) 50 MG tablet Take 0.5-1 tablets (25-50 mg total) by mouth at bedtime as needed for sleep. Take half tablet by mouth as needed at bedtime   VITAMIN K PO Take by mouth.     Allergies:   Tetracycline hcl   Social History   Socioeconomic History   Marital status: Married    Spouse name: Not on file   Number of children: Not on file   Years of education: Not on file   Highest education level: Not on file  Occupational History   Not on file  Tobacco Use   Smoking status: Former    Current packs/day: 0.00    Average packs/day: 2.5 packs/day for 13.0 years (32.5 ttl pk-yrs)    Types: Cigarettes    Start date: 07/27/1961    Quit date: 07/28/1974    Years since quitting: 49.8   Smokeless tobacco: Never  Vaping Use   Vaping status: Never Used  Substance and Sexual Activity   Alcohol use: Yes    Alcohol/week: 2.0 standard drinks of alcohol    Types: 2 Cans of beer per week   Drug use: No   Sexual activity: Yes    Partners: Male  Other Topics Concern   Not on file  Social History Narrative   Married. Two children.    College grad.    Former smoker.    Exercises routinely.    Drink caffeine.    Wears a hearing aid.     Smoke alarm in the home.    Wears her seatbelt.    Feels safe in her relationships.    Social Drivers of Health   Tobacco Use: Medium Risk (05/03/2024)   Patient History    Smoking Tobacco Use: Former    Smokeless Tobacco Use: Never    Passive Exposure: Not on file  Financial Resource Strain: Low Risk (08/31/2023)   Overall Financial Resource Strain (CARDIA)    Difficulty of Paying Living Expenses: Not hard at all  Food Insecurity: No Food Insecurity (08/31/2023)  Hunger Vital Sign    Worried About Running Out of Food in the Last Year: Never true    Ran Out of Food in the Last Year: Never true  Transportation Needs: No Transportation Needs (08/31/2023)   PRAPARE - Administrator, Civil Service (Medical): No    Lack of Transportation (Non-Medical): No  Physical Activity: Sufficiently Active (08/31/2023)   Exercise Vital Sign    Days of Exercise per Week: 4 days    Minutes of Exercise per Session: 50 min  Stress: No Stress Concern Present (08/31/2023)   [employer] of Occupational Health - Occupational Stress Questionnaire    Feeling of Stress : Not at all  Social Connections: Socially Integrated (08/31/2023)   Social Connection and Isolation Panel    Frequency of Communication with Friends and Family: More than three times a week    Frequency of Social Gatherings with Friends and Family: Once a week    Attends Religious Services: More than 4 times per year    Active Member of Clubs or Organizations: Yes    Attends Banker Meetings: More than 4 times per year    Marital Status: Married  Depression (PHQ2-9): Low Risk (08/31/2023)   Depression (PHQ2-9)    PHQ-2 Score: 1  Alcohol Screen: Low Risk (08/31/2023)   Alcohol Screen    Last Alcohol Screening Score (AUDIT): 1  Housing: Unknown (08/31/2023)   Housing Stability Vital Sign    Unable to Pay for Housing in the Last Year: No    Number of Times Moved in the Last Year: Not on file    Homeless in the Last Year: No   Utilities: Not At Risk (08/31/2023)   AHC Utilities    Threatened with loss of utilities: No  Health Literacy: Adequate Health Literacy (08/31/2023)   B1300 Health Literacy    Frequency of need for help with medical instructions: Never     Family History: The patient's family history includes Breast cancer in her cousin; Cancer in an other family member; Colon cancer in her maternal uncle and paternal aunt; Colon cancer (age of onset: 49) in her father; Colon polyps in her brother; Dementia in her mother; Heart attack in her maternal grandfather; Hyperlipidemia in her brother and mother; Hypertension in her brother; Uterine cancer in her maternal aunt.  ROS:   Review of Systems  Constitution: Negative for decreased appetite, fever and weight gain.  HENT: Negative for congestion, ear discharge, hoarse voice and sore throat.   Eyes: Negative for discharge, redness, vision loss in right eye and visual halos.  Cardiovascular: Negative for chest pain, dyspnea on exertion, leg swelling, orthopnea and palpitations.  Respiratory: Negative for cough, hemoptysis, shortness of breath and snoring.   Endocrine: Negative for heat intolerance and polyphagia.  Hematologic/Lymphatic: Negative for bleeding problem. Does not bruise/bleed easily.  Skin: Negative for flushing, nail changes, rash and suspicious lesions.  Musculoskeletal: Negative for arthritis, joint pain, muscle cramps, myalgias, neck pain and stiffness.  Gastrointestinal: Negative for abdominal pain, bowel incontinence, diarrhea and excessive appetite.  Genitourinary: Negative for decreased libido, genital sores and incomplete emptying.  Neurological: Negative for brief paralysis, focal weakness, headaches and loss of balance.  Psychiatric/Behavioral: Negative for altered mental status, depression and suicidal ideas.  Allergic/Immunologic: Negative for HIV exposure and persistent infections.    EKGs/Labs/Other Studies Reviewed:    The  following studies were reviewed today:   EKG:  The ekg ordered today demonstrates sinus bradycardia, HR 57 bpm  Recent Labs: 07/14/2023: ALT 21; TSH 2.73 01/05/2024: BUN 17; Creatinine, Ser 1.03; Hemoglobin 13.7; Platelets 243.0; Potassium 4.4; Sodium 142  Recent Lipid Panel    Component Value Date/Time   CHOL 167 07/14/2023 0758   TRIG 142.0 07/14/2023 0758   HDL 72.40 07/14/2023 0758   CHOLHDL 2 07/14/2023 0758   VLDL 28.4 07/14/2023 0758   LDLCALC 66 07/14/2023 0758   LDLDIRECT 158.2 05/07/2013 0928    Physical Exam:    VS:  BP 122/78 (BP Location: Left Arm, Patient Position: Sitting, Cuff Size: Normal)   Pulse (!) 57   Ht 5' 5 (1.651 m)   Wt 128 lb 6.4 oz (58.2 kg)   SpO2 91%   BMI 21.37 kg/m     Wt Readings from Last 3 Encounters:  05/03/24 128 lb 6.4 oz (58.2 kg)  01/05/24 128 lb 12.8 oz (58.4 kg)  10/11/23 126 lb 1.7 oz (57.2 kg)     GEN: Well nourished, well developed in no acute distress HEENT: Normal NECK: No JVD; No carotid bruits LYMPHATICS: No lymphadenopathy CARDIAC: S1S2 noted,RRR, no murmurs, rubs, gallops RESPIRATORY:  Clear to auscultation without rales, wheezing or rhonchi  ABDOMEN: Soft, non-tender, non-distended, +bowel sounds, no guarding. EXTREMITIES: No edema, No cyanosis, no clubbing MUSCULOSKELETAL:  No deformity  SKIN: Warm and dry NEUROLOGIC:  Alert and oriented x 3, non-focal PSYCHIATRIC:  Normal affect, good insight  ASSESSMENT:    1. Essential hypertension   2. Moderate mitral regurgitation   3. Coronary artery disease involving native coronary artery of native heart without angina pectoris   4. Mixed hyperlipidemia   5. PAF (paroxysmal atrial fibrillation) (HCC)   6. PAC (premature atrial contraction)    PLAN:    Paroxysmal atrial fibrillation-continue acebutolol  as well as her apixaban .  Blood pressure is acceptable, continue with current antihypertensive regimen.  Medication Refills Patient on Amlodipine , Eliquis ,  Lipitor, and Losartan . -Refill all current medications.  Moderate mitral regurgitation - does not appear to be volume overloaded. Plan for repeat echo today.  Coronary artery disease having no anginal symptoms  Continue current dose of statin, LDL goal less than 70  The patient is in agreement with the above plan. The patient left the office in stable condition.  The patient will follow up in   Medication Adjustments/Labs and Tests Ordered: Current medicines are reviewed at length with the patient today.  Concerns regarding medicines are outlined above.  Orders Placed This Encounter  Procedures   EKG 12-Lead   ECHOCARDIOGRAM COMPLETE   No orders of the defined types were placed in this encounter.   Patient Instructions  Medication Instructions:  Your physician recommends that you continue on your current medications as directed. Please refer to the Current Medication list given to you today.  *If you need a refill on your cardiac medications before your next appointment, please call your pharmacy*   Testing/Procedures: Your physician has requested that you have an echocardiogram. Echocardiography is a painless test that uses sound waves to create images of your heart. It provides your doctor with information about the size and shape of your heart and how well your hearts chambers and valves are working. This procedure takes approximately one hour. There are no restrictions for this procedure. Please do NOT wear cologne, perfume, aftershave, or lotions (deodorant is allowed). Please arrive 15 minutes prior to your appointment time.  Please note: We ask at that you not bring children with you during ultrasound (echo/ vascular) testing. Due to room size and  safety concerns, children are not allowed in the ultrasound rooms during exams. Our front office staff cannot provide observation of children in our lobby area while testing is being conducted. An adult accompanying a patient to  their appointment will only be allowed in the ultrasound room at the discretion of the ultrasound technician under special circumstances. We apologize for any inconvenience.   Follow-Up: At [facility], you and your health needs are our priority.  As part of our continuing mission to provide you with exceptional heart care, our providers are all part of one team.  This team includes your primary Cardiologist (physician) and Advanced Practice Providers or APPs (Physician Assistants and Nurse Practitioners) who all work together to provide you with the care you need, when you need it.  Your next appointment:   6 month(s) call in August   Provider:   Izan Miron, DO     Other Instructions:            Adopting a Healthy Lifestyle.  Know what a healthy weight is for you (roughly BMI <25) and aim to maintain this   Aim for 7+ servings of fruits and vegetables daily   65-80+ fluid ounces of water or unsweet tea for healthy kidneys   Limit to max 1 drink of alcohol per day; avoid smoking/tobacco   Limit animal fats in diet for cholesterol and heart health - choose grass fed whenever available   Avoid highly processed foods, and foods high in saturated/trans fats   Aim for low stress - take time to unwind and care for your mental health   Aim for 150 min of moderate intensity exercise weekly for heart health, and weights twice weekly for bone health   Aim for 7-9 hours of sleep daily   When it comes to diets, agreement about the perfect plan isnt easy to find, even among the experts. Experts at the [facility] of [employer] developed an idea known as the Healthy Eating Plate. Just imagine a plate divided into logical, healthy portions.   The emphasis is on diet quality:   Load up on vegetables and fruits - one-half of your plate: Aim for color and variety, and remember that potatoes dont count.   Go for whole grains - one-quarter of your plate: Whole wheat,  barley, wheat berries, quinoa, oats, brown rice, and foods made with them. If you want pasta, go with whole wheat pasta.   Protein power - one-quarter of your plate: Fish, chicken, beans, and nuts are all healthy, versatile protein sources. Limit red meat.   The diet, however, does go beyond the plate, offering a few other suggestions.   Use healthy plant oils, such as olive, canola, soy, corn, sunflower and peanut. Check the labels, and avoid partially hydrogenated oil, which have unhealthy trans fats.   If youre thirsty, drink water. Coffee and tea are good in moderation, but skip sugary drinks and limit milk and dairy products to one or two daily servings.   The type of carbohydrate in the diet is more important than the amount. Some sources of carbohydrates, such as vegetables, fruits, whole grains, and beans-are healthier than others.   Finally, stay active  Signed, Lazaro Isenhower, DO  05/03/2024 10:11 AM    [facility] "

## 2024-06-04 ENCOUNTER — Ambulatory Visit (HOSPITAL_COMMUNITY)

## 2024-07-19 ENCOUNTER — Encounter: Admitting: Family Medicine

## 2024-09-05 ENCOUNTER — Encounter

## 2024-10-16 ENCOUNTER — Ambulatory Visit: Admitting: Neurology

## 2024-10-30 ENCOUNTER — Ambulatory Visit: Admitting: Neurology
